# Patient Record
Sex: Male | Born: 1940
Health system: Southern US, Community
[De-identification: ages and names within clinical notes are randomized; demographics above are authoritative.]

## PROBLEM LIST (undated history)

## (undated) DIAGNOSIS — N529 Male erectile dysfunction, unspecified: Secondary | ICD-10-CM

## (undated) DIAGNOSIS — D689 Coagulation defect, unspecified: Secondary | ICD-10-CM

## (undated) DIAGNOSIS — E162 Hypoglycemia, unspecified: Secondary | ICD-10-CM

## (undated) DIAGNOSIS — H9190 Unspecified hearing loss, unspecified ear: Secondary | ICD-10-CM

## (undated) DIAGNOSIS — K635 Polyp of colon: Secondary | ICD-10-CM

## (undated) DIAGNOSIS — I4891 Unspecified atrial fibrillation: Secondary | ICD-10-CM

## (undated) DIAGNOSIS — I700 Atherosclerosis of aorta: Secondary | ICD-10-CM

## (undated) DIAGNOSIS — N400 Enlarged prostate without lower urinary tract symptoms: Secondary | ICD-10-CM

## (undated) DIAGNOSIS — T7840XA Allergy, unspecified, initial encounter: Secondary | ICD-10-CM

## (undated) DIAGNOSIS — E785 Hyperlipidemia, unspecified: Secondary | ICD-10-CM

## (undated) DIAGNOSIS — G4752 REM sleep behavior disorder: Secondary | ICD-10-CM

## (undated) DIAGNOSIS — I2510 Atherosclerotic heart disease of native coronary artery without angina pectoris: Secondary | ICD-10-CM

## (undated) DIAGNOSIS — C801 Malignant (primary) neoplasm, unspecified: Secondary | ICD-10-CM

## (undated) DIAGNOSIS — I951 Orthostatic hypotension: Secondary | ICD-10-CM

## (undated) HISTORY — PX: CORONARY ANGIOPLASTY WITH STENT PLACEMENT: SHX49

## (undated) HISTORY — DX: Atherosclerosis of aorta: I70.0

## (undated) HISTORY — DX: Male erectile dysfunction, unspecified: N52.9

## (undated) HISTORY — DX: Atherosclerotic heart disease of native coronary artery without angina pectoris: I25.10

## (undated) HISTORY — DX: REM sleep behavior disorder: G47.52

## (undated) HISTORY — DX: Orthostatic hypotension: I95.1

## (undated) HISTORY — PX: POLYPECTOMY: SHX149

## (undated) HISTORY — DX: Coagulation defect, unspecified: D68.9

## (undated) HISTORY — DX: Malignant (primary) neoplasm, unspecified: C80.1

## (undated) HISTORY — PX: UPPER GASTROINTESTINAL ENDOSCOPY: SHX188

## (undated) HISTORY — DX: Benign prostatic hyperplasia without lower urinary tract symptoms: N40.0

## (undated) HISTORY — DX: Allergy, unspecified, initial encounter: T78.40XA

## (undated) HISTORY — DX: Hyperlipidemia, unspecified: E78.5

## (undated) HISTORY — DX: Unspecified hearing loss, unspecified ear: H91.90

## (undated) HISTORY — PX: COLONOSCOPY: SHX174

## (undated) HISTORY — DX: Unspecified atrial fibrillation: I48.91

## (undated) HISTORY — DX: Polyp of colon: K63.5

## (undated) HISTORY — DX: Hypoglycemia, unspecified: E16.2

---

## 2006-01-04 ENCOUNTER — Encounter: Admission: RE | Admit: 2006-01-04 | Discharge: 2006-01-04 | Payer: Self-pay | Admitting: Family Medicine

## 2009-07-11 ENCOUNTER — Emergency Department (HOSPITAL_BASED_OUTPATIENT_CLINIC_OR_DEPARTMENT_OTHER): Admission: EM | Admit: 2009-07-11 | Discharge: 2009-07-11 | Payer: Self-pay | Admitting: Emergency Medicine

## 2009-07-11 ENCOUNTER — Ambulatory Visit: Payer: Self-pay | Admitting: Diagnostic Radiology

## 2012-07-04 ENCOUNTER — Emergency Department (HOSPITAL_BASED_OUTPATIENT_CLINIC_OR_DEPARTMENT_OTHER)
Admission: EM | Admit: 2012-07-04 | Discharge: 2012-07-04 | Disposition: A | Discharge status: Home / Self Care | Payer: BC Managed Care – PPO | Attending: Emergency Medicine | Admitting: Emergency Medicine

## 2012-07-04 ENCOUNTER — Emergency Department (HOSPITAL_BASED_OUTPATIENT_CLINIC_OR_DEPARTMENT_OTHER): Payer: BC Managed Care – PPO

## 2012-07-04 ENCOUNTER — Encounter (HOSPITAL_BASED_OUTPATIENT_CLINIC_OR_DEPARTMENT_OTHER): Payer: Self-pay | Admitting: *Deleted

## 2012-07-04 DIAGNOSIS — W19XXXA Unspecified fall, initial encounter: Secondary | ICD-10-CM

## 2012-07-04 DIAGNOSIS — S0180XA Unspecified open wound of other part of head, initial encounter: Secondary | ICD-10-CM | POA: Insufficient documentation

## 2012-07-04 DIAGNOSIS — I2510 Atherosclerotic heart disease of native coronary artery without angina pectoris: Secondary | ICD-10-CM | POA: Insufficient documentation

## 2012-07-04 DIAGNOSIS — Y9389 Activity, other specified: Secondary | ICD-10-CM | POA: Insufficient documentation

## 2012-07-04 DIAGNOSIS — Y929 Unspecified place or not applicable: Secondary | ICD-10-CM | POA: Insufficient documentation

## 2012-07-04 DIAGNOSIS — IMO0002 Reserved for concepts with insufficient information to code with codable children: Secondary | ICD-10-CM

## 2012-07-04 DIAGNOSIS — W010XXA Fall on same level from slipping, tripping and stumbling without subsequent striking against object, initial encounter: Secondary | ICD-10-CM | POA: Insufficient documentation

## 2012-07-04 DIAGNOSIS — M25519 Pain in unspecified shoulder: Secondary | ICD-10-CM | POA: Insufficient documentation

## 2012-07-04 HISTORY — DX: Atherosclerotic heart disease of native coronary artery without angina pectoris: I25.10

## 2012-07-04 MED ORDER — LIDOCAINE HCL 2 % IJ SOLN
INTRAMUSCULAR | Status: AC
  Filled 2012-07-04: qty 20

## 2012-07-04 NOTE — ED Notes (Signed)
MD at bedside. 

## 2012-07-04 NOTE — ED Notes (Signed)
Laceration over his right eye. Tripped over the dog and fell. Bleeding controlled. No loc. Hematoma noted.

## 2012-07-06 NOTE — ED Provider Notes (Signed)
History     CSN: [id number]  Arrival date & time 07/04/12  1759   First MD Initiated Contact with Patient 07/04/12 1953      Chief Complaint  Patient presents with  . Facial Laceration    (Consider location/radiation/quality/duration/timing/severity/associated sxs/prior treatment) HPI Comments: Pt comes in with cc of fall. Pt had a mechanical fall earlier today. He has mild bleeding, not on any anticoagulants. No nausea, vomiting, visual complains, seizures, altered mental status, loss of consciousness, new weakness, or numbness, no gait instability. Also has right shoulder pain.  The history is provided by the patient.    Past Medical History  Diagnosis Date  . Coronary artery disease     Past Surgical History  Procedure Date  . Coronary angioplasty with stent placement     No family history on file.  History  Substance Use Topics  . Smoking status: Never Smoker   . Smokeless tobacco: Not on file  . Alcohol Use:       Review of Systems  Constitutional: Negative for activity change and appetite change.  Respiratory: Negative for cough and shortness of breath.   Cardiovascular: Negative for chest pain.  Gastrointestinal: Negative for abdominal pain.  Genitourinary: Negative for dysuria.    Allergies  Penicillins  Home Medications  No current outpatient prescriptions on file.  BP 129/82  Pulse 80  Temp 97.7 F (36.5 C) (Oral)  Resp 18  SpO2 100%  Physical Exam  Nursing note and vitals reviewed. Constitutional: He is oriented to person, place, and time. He appears well-developed.  HENT:  Head: Normocephalic and atraumatic.  Eyes: Conjunctivae normal and EOM are normal. Pupils are equal, round, and reactive to light.  Neck: Normal range of motion. Neck supple.  Cardiovascular: Normal rate and regular rhythm.   Pulmonary/Chest: Effort normal and breath sounds normal.  Abdominal: Soft. Bowel sounds are normal. He exhibits no distension. There is  no tenderness. There is no rebound and no guarding.  Musculoskeletal: Normal range of motion. He exhibits no edema and no tenderness.       Right shoulder exam is normal  Neurological: He is alert and oriented to person, place, and time.  Skin: Skin is warm.       1.5 cm, macerated laceration with mild skin loss on the forehead, right above the right eye.    ED Course  Procedures (including critical care time)  Labs Reviewed - No data to display Dg Shoulder Right  07/04/2012  *RADIOLOGY REPORT*  Clinical Data: Facial laceration.  Trauma.  Fall.  RIGHT SHOULDER - 2+ VIEW  Comparison: None.  Findings: Internal and external rotation views of the right shoulder appear within normal limits.  AC joint appears normal. The shoulder is located.  Visualized right chest normal.  IMPRESSION: Negative.   Original Report Authenticated By: Andreas Newport, M.D.    Ct Head Wo Contrast  07/04/2012  *RADIOLOGY REPORT*  Clinical Data: Head trauma.  Right frontal laceration.  Right anterior shoulder pain.  CT HEAD WITHOUT CONTRAST  Technique:  Contiguous axial images were obtained from the base of the skull through the vertex without contrast.  Comparison: None.  Findings: Mild soft tissue swelling is present in the right periorbital region with a small laceration of the lateral orbital rim.  The visualized globe appears intact.  Paranasal sinuses normal.  There is no mass effect, midline shift, hydrocephalus, or hemorrhage.  No territorial ischemia or acute infarction. Calvarium is intact.  In the left parietal lobe adjacent  to the central sulcus, there is a 12 mm area (image number 18 series 2) of increased attenuation. This has poorly defined margins. Statistically this likely represents a benign cavernoma however follow-up imaging is warranted.  IMPRESSION: 1.  Right periorbital laceration and soft tissue swelling. 2.  No acute intracranial abnormality. 2.  12 mm left parietal lobe intraaxial high attenuation  lesion adjacent to the central sulcus warrants further evaluation with non emergent MRI of the brain with and without infusion.  Statistically this likely represents a benign cavernoma but the appearance is nonspecific on noncontrast head CT.   Original Report Authenticated By: Andreas Newport, M.D.      1. Fall   2. Laceration       MDM  DDx includes: - Mechanical falls - ICH - Fractures - Contusions - Soft tissue injury Pt has a laceration - which was repaired. CT head ordered, and it was neg.  LACERATION REPAIR Performed by: Derwood Kaplan Authorized by: Derwood Kaplan Consent: Verbal consent obtained. Risks and benefits: risks, benefits and alternatives were discussed Consent given by: patient Patient identity confirmed: provided demographic data Prepped and Draped in normal sterile fashion Wound explored  Laceration Location: right forehead  Laceration Length: 2 cm  No Foreign Bodies seen or palpated  Anesthesia: local infiltration  Local anesthetic: lidocaine 2% with epinephrine  Anesthetic total: 3 ml  Irrigation method: syringe Amount of cleaning: standard  Skin closure: sutures - vicryl, 5-0  Number of sutures: 3  Technique: simple interrupted  Patient tolerance: Patient tolerated the procedure well with no immediate complications.  Derwood Kaplan, MD 07/06/12 [phone]

## 2013-09-19 DIAGNOSIS — I2510 Atherosclerotic heart disease of native coronary artery without angina pectoris: Secondary | ICD-10-CM | POA: Diagnosis not present

## 2013-09-19 DIAGNOSIS — I10 Essential (primary) hypertension: Secondary | ICD-10-CM | POA: Diagnosis not present

## 2013-09-19 DIAGNOSIS — E7800 Pure hypercholesterolemia, unspecified: Secondary | ICD-10-CM | POA: Diagnosis not present

## 2013-10-25 DIAGNOSIS — C44319 Basal cell carcinoma of skin of other parts of face: Secondary | ICD-10-CM | POA: Diagnosis not present

## 2013-11-27 DIAGNOSIS — R351 Nocturia: Secondary | ICD-10-CM | POA: Diagnosis not present

## 2013-11-27 DIAGNOSIS — R413 Other amnesia: Secondary | ICD-10-CM | POA: Diagnosis not present

## 2013-11-27 DIAGNOSIS — I2510 Atherosclerotic heart disease of native coronary artery without angina pectoris: Secondary | ICD-10-CM | POA: Diagnosis not present

## 2013-11-27 DIAGNOSIS — J069 Acute upper respiratory infection, unspecified: Secondary | ICD-10-CM | POA: Diagnosis not present

## 2013-11-27 DIAGNOSIS — E7800 Pure hypercholesterolemia, unspecified: Secondary | ICD-10-CM | POA: Diagnosis not present

## 2013-11-27 DIAGNOSIS — E559 Vitamin D deficiency, unspecified: Secondary | ICD-10-CM | POA: Diagnosis not present

## 2013-11-27 DIAGNOSIS — Z125 Encounter for screening for malignant neoplasm of prostate: Secondary | ICD-10-CM | POA: Diagnosis not present

## 2013-11-27 DIAGNOSIS — R5381 Other malaise: Secondary | ICD-10-CM | POA: Diagnosis not present

## 2013-11-27 DIAGNOSIS — Z0000 Encounter for general adult medical examination without abnormal findings: Secondary | ICD-10-CM | POA: Diagnosis not present

## 2013-12-07 DIAGNOSIS — R972 Elevated prostate specific antigen [PSA]: Secondary | ICD-10-CM | POA: Diagnosis not present

## 2014-01-22 DIAGNOSIS — N529 Male erectile dysfunction, unspecified: Secondary | ICD-10-CM | POA: Diagnosis not present

## 2014-01-22 DIAGNOSIS — R972 Elevated prostate specific antigen [PSA]: Secondary | ICD-10-CM | POA: Diagnosis not present

## 2014-02-11 DIAGNOSIS — N529 Male erectile dysfunction, unspecified: Secondary | ICD-10-CM | POA: Diagnosis not present

## 2014-02-11 DIAGNOSIS — R972 Elevated prostate specific antigen [PSA]: Secondary | ICD-10-CM | POA: Diagnosis not present

## 2014-03-21 DIAGNOSIS — R9431 Abnormal electrocardiogram [ECG] [EKG]: Secondary | ICD-10-CM | POA: Diagnosis not present

## 2014-03-21 DIAGNOSIS — I2510 Atherosclerotic heart disease of native coronary artery without angina pectoris: Secondary | ICD-10-CM | POA: Diagnosis not present

## 2014-03-21 DIAGNOSIS — E7800 Pure hypercholesterolemia, unspecified: Secondary | ICD-10-CM | POA: Diagnosis not present

## 2014-05-01 DIAGNOSIS — Z23 Encounter for immunization: Secondary | ICD-10-CM | POA: Diagnosis not present

## 2014-10-15 DIAGNOSIS — L409 Psoriasis, unspecified: Secondary | ICD-10-CM | POA: Diagnosis not present

## 2014-10-15 DIAGNOSIS — L570 Actinic keratosis: Secondary | ICD-10-CM | POA: Diagnosis not present

## 2014-10-29 ENCOUNTER — Ambulatory Visit (HOSPITAL_COMMUNITY)
Admission: RE | Admit: 2014-10-29 | Discharge: 2014-10-29 | Disposition: A | Discharge status: Home / Self Care | Payer: Medicare Other | Source: Ambulatory Visit | Attending: Internal Medicine | Admitting: Internal Medicine

## 2014-10-29 ENCOUNTER — Encounter (HOSPITAL_COMMUNITY): Payer: Self-pay | Admitting: Internal Medicine

## 2014-10-29 ENCOUNTER — Encounter (HOSPITAL_COMMUNITY): Payer: Self-pay

## 2014-10-29 VITALS — BP 86/52 | HR 86 | Ht 72.0 in | Wt 158.1 lb

## 2014-10-29 DIAGNOSIS — I509 Heart failure, unspecified: Secondary | ICD-10-CM | POA: Diagnosis not present

## 2014-10-29 DIAGNOSIS — I2510 Atherosclerotic heart disease of native coronary artery without angina pectoris: Secondary | ICD-10-CM | POA: Diagnosis not present

## 2014-10-29 DIAGNOSIS — Z79899 Other long term (current) drug therapy: Secondary | ICD-10-CM | POA: Insufficient documentation

## 2014-10-29 DIAGNOSIS — E162 Hypoglycemia, unspecified: Secondary | ICD-10-CM | POA: Diagnosis not present

## 2014-10-29 DIAGNOSIS — Z7982 Long term (current) use of aspirin: Secondary | ICD-10-CM | POA: Diagnosis not present

## 2014-10-29 DIAGNOSIS — I25811 Atherosclerosis of native coronary artery of transplanted heart without angina pectoris: Secondary | ICD-10-CM | POA: Diagnosis not present

## 2014-10-29 DIAGNOSIS — E785 Hyperlipidemia, unspecified: Secondary | ICD-10-CM | POA: Diagnosis not present

## 2014-10-29 MED ORDER — ATORVASTATIN CALCIUM 10 MG PO TABS: 10.0000 mg | ORAL_TABLET | Freq: Every day | ORAL | Status: DC

## 2014-10-29 MED ORDER — NITROGLYCERIN 0.4 MG SL SUBL: 0.4000 mg | SUBLINGUAL_TABLET | SUBLINGUAL | Status: DC | PRN

## 2014-10-29 NOTE — Progress Notes (Signed)
' Patient ID: Jerry Barr, male   DOB: Mar 02, 1941, 74 y.o.   MRN: [id number]  PCP: Casimiro Needle   HPI:   Jerry Barr is a very pleasant 74 y/o male with CAD, HL and hypoglycemia who is referred by Dr. Carleene Cooper at [facility] in [facility] to establish care as he is moving to the area.  He has h/o CAD which manifested as indigestion and underwent PCI of LAD in 2011 with Promus 2.75x87mm DES. Otherwise has mild to moderate CAD. Myoview 2012 with EF 58% Had ST depression but Myoview images were normal.   He has had a h/o of hypoglycemia and has been very diligent about managing his diet and eating healthy. Eats foods low on glycemic index. Does fast walking about 200 mins per week. Denies CP or indigestion. Was scheduled for stress test in March.    Review of Systems: [y] = yes, [ ]  = no   General: Weight gain [ ] ; Weight loss [ ] ; Anorexia [ ] ; Fatigue [ ] ; Fever [ ] ; Chills [ ] ; Weakness [ ]   Cardiac: Chest pain/pressure [ ] ; Resting SOB [ ] ; Exertional SOB [ ] ; Orthopnea [ ] ; Pedal Edema [ ] ; Palpitations [ ] ; Syncope [ ] ; Presyncope [ ] ; Paroxysmal nocturnal dyspnea[ ]   Pulmonary: Cough [ ] ; Wheezing[ ] ; Hemoptysis[ ] ; Sputum [ ] ; Snoring [ ]   GI: Vomiting[ ] ; Dysphagia[ ] ; Melena[ ] ; Hematochezia [ ] ; Heartburn[ ] ; Abdominal pain [ ] ; Constipation [ ] ; Diarrhea [ ] ; BRBPR [ ]   GU: Hematuria[ ] ; Dysuria [ ] ; Nocturia[ ]   Vascular: Pain in legs with walking [ ] ; Pain in feet with lying flat [ ] ; Non-healing sores [ ] ; Stroke [ ] ; TIA [ ] ; Slurred speech [ ] ;  Neuro: Headaches[ ] ; Vertigo[ ] ; Seizures[ ] ; Paresthesias[ ] ;Blurred vision [ ] ; Diplopia [ ] ; Vision changes [ ]   Ortho/Skin: Arthritis Blue.Reese ]; Joint pain [ ] ; Muscle pain [ ] ; Joint swelling [ ] ; Back Pain [ ] ; Rash [ ]   Psych: Depression[ ] ; Anxiety[ ]   Heme: Bleeding problems [ ] ; Clotting disorders [ ] ; Anemia [ ]   Endocrine: Diabetes [ ] ; Thyroid dysfunction[ ]    Past Medical History  Diagnosis Date  . Coronary artery disease       Promus drug-eluting stent to a 99% mid LAD, 40-50% narrowing in the distal LAD with mild to moderate disease in the circ and RCA  . Dyslipidemia   . ED (erectile dysfunction)   . BPH (benign prostatic hyperplasia)   . Hyperplastic colon polyp     Current Outpatient Prescriptions  Medication Sig Dispense Refill  . aspirin 81 MG tablet Take 81 mg by mouth daily.    Marland Kitchen atorvastatin (LIPITOR) 10 MG tablet Take 10 mg by mouth daily.    . Cholecalciferol (VITAMIN D-3) 1000 UNITS CAPS Take 1,000 Units by mouth daily.    . fluticasone (FLONASE) 50 MCG/ACT nasal spray Place 2 sprays into both nostrils as needed for allergies or rhinitis.    Marland Kitchen loratadine (CLARITIN) 10 MG tablet Take 10 mg by mouth daily.    . Multiple Vitamin (MULTIVITAMIN) tablet Take 1 tablet by mouth daily.    . nitroGLYCERIN (NITROSTAT) 0.4 MG SL tablet Place 0.4 mg under the tongue every 5 (five) minutes as needed for chest pain.    Marland Kitchen Psyllium (METAMUCIL PO) Take by mouth.     No current facility-administered medications for this encounter.    Allergies  Allergen Reactions  . Penicillins  States at one time he was allergic but has had it since and had no problem.       History   Social History  . Marital Status: Married    Spouse Name: N/A  . Number of Children: N/A  . Years of Education: N/A   Occupational History  . Not on file.   Social History Main Topics  . Smoking status: Never Smoker   . Smokeless tobacco: Not on file  . Alcohol Use: Not on file  . Drug Use: No  . Sexual Activity: Not on file   Other Topics Concern  . Not on file   Social History Narrative      Family History  Problem Relation Age of Onset  .       Filed Vitals:   10/29/14 1145  BP: 86/52  Pulse: 86  Height: 6' (1.829 m)  Weight: 158 lb 1.9 oz (71.723 kg)  SpO2: 98%    PHYSICAL EXAM: General:  Well appearing. No respiratory difficulty HEENT: normal Neck: supple. no JVD. Carotids 2+ bilat; no bruits. No  lymphadenopathy or thryomegaly appreciated. Cor: PMI nondisplaced. Regular rate & rhythm. No rubs, gallops or murmurs. Lungs: clear Abdomen: soft, nontender, nondistended. No hepatosplenomegaly. No bruits or masses. Good bowel sounds. Extremities: no cyanosis, clubbing, rash, edema Neuro: alert & oriented x 3, cranial nerves grossly intact. moves all 4 extremities w/o difficulty. Affect pleasant.  ECG: NSR 73 No ST-T wave abnormalities.   ASSESSMENT & PLAN: 1. CAD    --s/p Promus DES to 99% mLAD stenosis 2011 (in [place]). Moderate nonobstructive CAD otherwise    --Myoview 2012 EF 58% no ischemia. False + ECG.  2. Hyperlipidemia 3. History of hypoglycemia with associated syncope 4. ED  Doing very well. Due for repeat stress testing. Given h/o false positive ECG on treadmill testing will proceed with treadmill myoview.  Will check routine labs including lipids, CMET, CBC, TSH and PSA for his PCP. F/u 1 year with Dr. Martinique at [facility].  Benay Spice 12:39 PM

## 2014-10-29 NOTE — Addendum Note (Signed)
Encounter addended by: Kerry Dory, CMA on: 10/29/2014 12:47 PM<BR>     Documentation filed: Patient Instructions Section, Dx Association, Orders

## 2014-10-29 NOTE — Patient Instructions (Signed)
Labs needed next available (FLP, CMET, TSH, CBC, PSA----FASTING)  Your physician has requested that you have en exercise stress myoview. For further information please visit [email or web address]. Please follow instruction sheet, as given.  Your physician recommends that you schedule a follow-up appointment in: 1 year with Dr.Jordan at [facility]

## 2014-11-01 ENCOUNTER — Encounter: Payer: Self-pay | Admitting: Cardiology

## 2014-11-06 ENCOUNTER — Telehealth (HOSPITAL_COMMUNITY): Payer: Self-pay

## 2014-11-06 NOTE — Telephone Encounter (Signed)
Encounter complete. 

## 2014-11-07 ENCOUNTER — Ambulatory Visit (HOSPITAL_COMMUNITY)
Admission: RE | Admit: 2014-11-07 | Discharge: 2014-11-07 | Disposition: A | Discharge status: Home / Self Care | Payer: Medicare Other | Source: Ambulatory Visit | Attending: Cardiology | Admitting: Cardiology

## 2014-11-07 ENCOUNTER — Encounter (HOSPITAL_COMMUNITY): Payer: Medicare Other

## 2014-11-07 DIAGNOSIS — I2510 Atherosclerotic heart disease of native coronary artery without angina pectoris: Secondary | ICD-10-CM | POA: Diagnosis not present

## 2014-11-07 DIAGNOSIS — E785 Hyperlipidemia, unspecified: Secondary | ICD-10-CM | POA: Diagnosis not present

## 2014-11-07 DIAGNOSIS — I25811 Atherosclerosis of native coronary artery of transplanted heart without angina pectoris: Secondary | ICD-10-CM | POA: Diagnosis not present

## 2014-11-07 MED ORDER — TECHNETIUM TC 99M SESTAMIBI GENERIC - CARDIOLITE
32.0000 | Freq: Once | INTRAVENOUS | Status: AC | PRN
  Administered 2014-11-07: 32 via INTRAVENOUS

## 2014-11-07 MED ORDER — TECHNETIUM TC 99M SESTAMIBI GENERIC - CARDIOLITE
10.9000 | Freq: Once | INTRAVENOUS | Status: AC | PRN
  Administered 2014-11-07: 10.9 via INTRAVENOUS

## 2014-11-07 NOTE — Procedures (Addendum)
[facility] [address] [place] [address] [phone]  Cardiology Nuclear Med Study  Jerry Barr is a 74 y.o. male     MRN : [id number]     DOB: 03/09/1941  Procedure Date: 11/07/2014  Nuclear Med Background Indication for Stress Test:  Evaluation for Ischemia and Follow up CAD History:  CAD;STENT/PTCA-2011;Last NUC MPI in 07/2010-normal;EF=58%;ETT-1997-normal; Cardiac Risk Factors: Lipids  Symptoms:  Pt denies symptoms at this time.   Nuclear Pre-Procedure Caffeine/Decaff Intake:  7:00pm NPO After: 3:00am   IV Site: R Forearm  IV 0.9% NS with Angio Cath:  22g  Chest Size (in):  36" IV Started by: Larene Beach, RN  Height: 6\' 1"  (1.854 m)  Cup Size: n/a  BMI:  Body mass index is 20.85 kg/(m^2). Weight:  158 lb (71.668 kg)   Tech Comments:  n/a    Nuclear Med Study 1 or 2 day study: 1 day  Stress Test Type:  Stress  Order Authorizing Provider:  Glori Bickers, MD   Resting Radionuclide: Technetium 14m Sestamibi  Resting Radionuclide Dose: 10.9 mCi   Stress Radionuclide:  Technetium 5m Sestamibi  Stress Radionuclide Dose: 32.0 mCi           Stress Protocol Rest HR: 91 Stress HR: 136  Rest BP: 86/67 Stress BP: 164/82  Exercise Time (min): 11:30 METS: 13.5   Predicted Max HR: 147 bpm % Max HR: 92.52 bpm Rate Pressure Product: 22304  Dose of Adenosine (mg):  n/a Dose of Lexiscan: n/a mg  Dose of Atropine (mg): n/a Dose of Dobutamine: n/a mcg/kg/min (at max HR)  Stress Test Technologist: Leane Para, CCT Nuclear Technologist:Elizabeth Young,CNMT   Rest Procedure:  Myocardial perfusion imaging was performed at rest 45 minutes following the intravenous administration of Technetium 81m Sestamibi. Stress Procedure:  The patient performed treadmill exercise using a Bruce  Protocol for 11:30 minutes. The patient stopped due to Shoulder and arm pain from holding on and denied any chest pain.  There were  significant ST-T wave changes.  Technetium 72m Sestamibi was injected IV at peak exercise and myocardial perfusion imaging was performed after a brief delay.  Transient Ischemic Dilatation (Normal <1.22):  1.05 QGS EDV:  109 ml QGS ESV:  50 ml LV Ejection Fraction: 54%      Rest ECG: NSR - Normal EKG  Stress ECG: Insignificant upsloping ST segment depression.  QPS Raw Data Images:  Normal; no motion artifact; normal heart/lung ratio. Stress Images:  Normal homogeneous uptake in all areas of the myocardium. Rest Images:  Normal homogeneous uptake in all areas of the myocardium. Subtraction (SDS):  No evidence of ischemia. LV Wall Motion:  NL LV Function; NL Wall Motion  Impression Exercise Capacity:  Excellent exercise capacity. BP Response:  Normal blood pressure response. Clinical Symptoms:  No significant symptoms noted. ECG Impression:  Insignificant upsloping ST segment depression. Comparison with Prior Nuclear Study: No significant change from previous study   Overall Impression:  Normal stress nuclear study.   [name], MD  11/07/2014 7:19 PM

## 2015-04-02 DIAGNOSIS — D239 Other benign neoplasm of skin, unspecified: Secondary | ICD-10-CM | POA: Diagnosis not present

## 2015-04-02 DIAGNOSIS — L570 Actinic keratosis: Secondary | ICD-10-CM | POA: Diagnosis not present

## 2015-04-02 DIAGNOSIS — L820 Inflamed seborrheic keratosis: Secondary | ICD-10-CM | POA: Diagnosis not present

## 2015-04-04 DIAGNOSIS — Z1389 Encounter for screening for other disorder: Secondary | ICD-10-CM | POA: Diagnosis not present

## 2015-04-04 DIAGNOSIS — I2510 Atherosclerotic heart disease of native coronary artery without angina pectoris: Secondary | ICD-10-CM | POA: Diagnosis not present

## 2015-04-04 DIAGNOSIS — Z23 Encounter for immunization: Secondary | ICD-10-CM | POA: Diagnosis not present

## 2015-04-04 DIAGNOSIS — H9190 Unspecified hearing loss, unspecified ear: Secondary | ICD-10-CM | POA: Diagnosis not present

## 2015-04-04 DIAGNOSIS — E785 Hyperlipidemia, unspecified: Secondary | ICD-10-CM | POA: Diagnosis not present

## 2015-04-04 DIAGNOSIS — Z0000 Encounter for general adult medical examination without abnormal findings: Secondary | ICD-10-CM | POA: Diagnosis not present

## 2015-04-04 DIAGNOSIS — J309 Allergic rhinitis, unspecified: Secondary | ICD-10-CM | POA: Diagnosis not present

## 2015-04-04 DIAGNOSIS — Z125 Encounter for screening for malignant neoplasm of prostate: Secondary | ICD-10-CM | POA: Diagnosis not present

## 2015-04-29 DIAGNOSIS — L570 Actinic keratosis: Secondary | ICD-10-CM | POA: Diagnosis not present

## 2015-05-27 DIAGNOSIS — D0422 Carcinoma in situ of skin of left ear and external auricular canal: Secondary | ICD-10-CM | POA: Diagnosis not present

## 2015-05-27 DIAGNOSIS — D0420 Carcinoma in situ of skin of unspecified ear and external auricular canal: Secondary | ICD-10-CM | POA: Diagnosis not present

## 2015-06-25 DIAGNOSIS — J209 Acute bronchitis, unspecified: Secondary | ICD-10-CM | POA: Diagnosis not present

## 2015-06-25 DIAGNOSIS — J329 Chronic sinusitis, unspecified: Secondary | ICD-10-CM | POA: Diagnosis not present

## 2015-09-30 ENCOUNTER — Other Ambulatory Visit: Payer: Self-pay | Admitting: Family Medicine

## 2015-09-30 ENCOUNTER — Ambulatory Visit
Admission: RE | Admit: 2015-09-30 | Discharge: 2015-09-30 | Disposition: A | Discharge status: Home / Self Care | Payer: Medicare Other | Source: Ambulatory Visit | Attending: Family Medicine | Admitting: Family Medicine

## 2015-09-30 DIAGNOSIS — R05 Cough: Secondary | ICD-10-CM | POA: Diagnosis not present

## 2015-09-30 DIAGNOSIS — J301 Allergic rhinitis due to pollen: Secondary | ICD-10-CM | POA: Diagnosis not present

## 2015-09-30 DIAGNOSIS — J069 Acute upper respiratory infection, unspecified: Secondary | ICD-10-CM

## 2015-12-09 ENCOUNTER — Other Ambulatory Visit (HOSPITAL_COMMUNITY): Payer: Self-pay | Admitting: Internal Medicine

## 2015-12-12 ENCOUNTER — Encounter: Payer: Self-pay | Admitting: Cardiology

## 2015-12-12 ENCOUNTER — Ambulatory Visit (INDEPENDENT_AMBULATORY_CARE_PROVIDER_SITE_OTHER): Payer: Medicare Other | Admitting: Cardiology

## 2015-12-12 VITALS — BP 80/52 | HR 84 | Ht 72.0 in | Wt 161.0 lb

## 2015-12-12 DIAGNOSIS — I2510 Atherosclerotic heart disease of native coronary artery without angina pectoris: Secondary | ICD-10-CM | POA: Diagnosis not present

## 2015-12-12 DIAGNOSIS — E785 Hyperlipidemia, unspecified: Secondary | ICD-10-CM | POA: Diagnosis not present

## 2015-12-12 NOTE — Progress Notes (Signed)
' Patient ID: Jerry Barr, male   DOB: Mar 09, 1941, 75 y.o.   MRN: [id number]  PCP: Jerry Contras MD Cardiologist: Jerry Martinique MD  HPI:   Jerry Barr is a very pleasant 75 y/o male seen for follow up  CAD, HL and hypoglycemia. He was originally referred by Jerry Barr at [facility] in [facility] to establish care once he moved to this area. He previously worked for [employer] for 30 years. 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   He has h/o CAD which manifested as indigestion and underwent PCI of LAD in 2011 with Promus 2.75x38mm DES. Otherwise has mild to moderate CAD. Myoview April  2016 was normal.   He has had a h/o of hypoglycemia and has been very diligent about managing his diet and eating healthy. He also has a history of chronic hypotension but really denies dizziness or syncope.  Does fast walking regularly. Denies CP or indigestion.    ROS: as noted in HPI. All other systems are reviewed and are negative.   Past Medical History  Diagnosis Date  . Coronary artery disease     Promus drug-eluting stent to a 99% mid LAD, 40-50% narrowing in the distal LAD with mild to moderate disease in the circ and RCA  . Dyslipidemia   . ED (erectile dysfunction)   . BPH (benign prostatic hyperplasia)   . Hyperplastic colon polyp   . Hypoglycemia     Current Outpatient Prescriptions  Medication Sig Dispense Refill  . aspirin 81 MG tablet Take 81 mg by mouth daily.    Marland Kitchen atorvastatin (LIPITOR) 10 MG tablet TAKE 1 TABLET (10 MG TOTAL) BY MOUTH DAILY. 90 tablet 3  . Cholecalciferol (VITAMIN D-3) 1000 UNITS CAPS Take 1,000 Units by mouth daily.    . fluticasone (FLONASE) 50 MCG/ACT nasal spray Place 2 sprays into both nostrils as needed for allergies or rhinitis.    Marland Kitchen loratadine (CLARITIN) 10 MG tablet Take 10 mg by mouth daily.    . Multiple Vitamin (MULTIVITAMIN) tablet Take 1 tablet by mouth daily.    . nitroGLYCERIN (NITROSTAT)  0.4 MG SL tablet Place 1 tablet (0.4 mg total) under the tongue every 5 (five) minutes as needed for chest pain. 30 tablet 3  . Psyllium (METAMUCIL PO) Take by mouth.     No current facility-administered medications for this visit.    Allergies  Allergen Reactions  . Penicillins     States at one time he was allergic but has had it since and had no problem.       Social History   Social History  . Marital Status: Married    Spouse Name: N/A  . Number of Children: N/A  . Years of Education: N/A   Occupational History  . Not on file.   Social History Main Topics  . Smoking status: Never Smoker   . Smokeless tobacco: Not on file  . Alcohol Use: Not on file  . Drug Use: No  . Sexual Activity: Not on file   Other Topics Concern  . Not on file   Social History Narrative      History reviewed. No pertinent family history.  Filed Vitals:   12/12/15 0903  BP: 80/52  Pulse: 84  Height: 6' (1.829 m)  Weight: 73.029 kg (161 lb)    PHYSICAL EXAM: General:  Well appearing. No respiratory difficulty HEENT: normal Neck: supple. no JVD. Carotids 2+  bilat; no bruits. No lymphadenopathy or thryomegaly appreciated. Cor: PMI nondisplaced. Regular rate & rhythm. No rubs, gallops or murmurs. Lungs: clear Abdomen: soft, nontender, nondistended. No hepatosplenomegaly. No bruits or masses. Good bowel sounds. Extremities: no cyanosis, clubbing, rash, edema Neuro: alert & oriented x 3, cranial nerves grossly intact. moves all 4 extremities w/o difficulty. Affect pleasant.  ECG: NSR 84. Normal. I have personally reviewed and interpreted this study.   ASSESSMENT & PLAN: 1. CAD    --s/p Promus DES to 99% mLAD stenosis 2011 (in [place]). Moderate nonobstructive CAD otherwise    --Myoview April 2016 was normal.    - he is asymptomatic. Continue ASA and statin. Not a candidate for beta blocker due to chronic hypotension.  2. Hyperlipidemia 3. History of hypoglycemia  4. Chronic  hypotension.  Follow up lab work done by PCP. I will follow up in one year.   Jerry Barr 12:18 PM

## 2015-12-12 NOTE — Patient Instructions (Signed)
Continue your current therapy  I will see you in one year   

## 2015-12-12 NOTE — Addendum Note (Signed)
Addended by: Kathyrn Lass on: 12/12/2015 01:21 PM   Modules accepted: Orders

## 2016-04-12 DIAGNOSIS — D239 Other benign neoplasm of skin, unspecified: Secondary | ICD-10-CM | POA: Diagnosis not present

## 2016-04-12 DIAGNOSIS — L570 Actinic keratosis: Secondary | ICD-10-CM | POA: Diagnosis not present

## 2016-04-12 DIAGNOSIS — L821 Other seborrheic keratosis: Secondary | ICD-10-CM | POA: Diagnosis not present

## 2016-04-20 ENCOUNTER — Encounter (HOSPITAL_COMMUNITY): Payer: Self-pay | Admitting: Emergency Medicine

## 2016-04-20 ENCOUNTER — Emergency Department (HOSPITAL_COMMUNITY): Payer: Medicare Other

## 2016-04-20 ENCOUNTER — Encounter (HOSPITAL_COMMUNITY)
Admission: EM | Disposition: A | Discharge status: Home / Self Care | Payer: Self-pay | Source: Home / Self Care | Attending: Cardiology

## 2016-04-20 ENCOUNTER — Inpatient Hospital Stay (HOSPITAL_COMMUNITY)
Admission: EM | Admit: 2016-04-20 | Discharge: 2016-04-23 | DRG: 309 | Disposition: A | Discharge status: Home / Self Care | Payer: Medicare Other | Attending: Cardiology | Admitting: Cardiology

## 2016-04-20 DIAGNOSIS — Z880 Allergy status to penicillin: Secondary | ICD-10-CM

## 2016-04-20 DIAGNOSIS — I2510 Atherosclerotic heart disease of native coronary artery without angina pectoris: Secondary | ICD-10-CM | POA: Diagnosis present

## 2016-04-20 DIAGNOSIS — Z8249 Family history of ischemic heart disease and other diseases of the circulatory system: Secondary | ICD-10-CM

## 2016-04-20 DIAGNOSIS — R079 Chest pain, unspecified: Secondary | ICD-10-CM | POA: Diagnosis not present

## 2016-04-20 DIAGNOSIS — S8992XA Unspecified injury of left lower leg, initial encounter: Secondary | ICD-10-CM | POA: Diagnosis not present

## 2016-04-20 DIAGNOSIS — Z87891 Personal history of nicotine dependence: Secondary | ICD-10-CM | POA: Diagnosis not present

## 2016-04-20 DIAGNOSIS — R55 Syncope and collapse: Secondary | ICD-10-CM | POA: Diagnosis present

## 2016-04-20 DIAGNOSIS — I340 Nonrheumatic mitral (valve) insufficiency: Secondary | ICD-10-CM | POA: Diagnosis not present

## 2016-04-20 DIAGNOSIS — N400 Enlarged prostate without lower urinary tract symptoms: Secondary | ICD-10-CM | POA: Diagnosis present

## 2016-04-20 DIAGNOSIS — I4891 Unspecified atrial fibrillation: Principal | ICD-10-CM | POA: Diagnosis present

## 2016-04-20 DIAGNOSIS — Z79899 Other long term (current) drug therapy: Secondary | ICD-10-CM | POA: Diagnosis not present

## 2016-04-20 DIAGNOSIS — E785 Hyperlipidemia, unspecified: Secondary | ICD-10-CM | POA: Diagnosis not present

## 2016-04-20 DIAGNOSIS — I248 Other forms of acute ischemic heart disease: Secondary | ICD-10-CM | POA: Diagnosis present

## 2016-04-20 DIAGNOSIS — R002 Palpitations: Secondary | ICD-10-CM | POA: Diagnosis not present

## 2016-04-20 DIAGNOSIS — S0990XA Unspecified injury of head, initial encounter: Secondary | ICD-10-CM | POA: Diagnosis not present

## 2016-04-20 DIAGNOSIS — R918 Other nonspecific abnormal finding of lung field: Secondary | ICD-10-CM | POA: Diagnosis not present

## 2016-04-20 DIAGNOSIS — I9589 Other hypotension: Secondary | ICD-10-CM | POA: Diagnosis not present

## 2016-04-20 DIAGNOSIS — M25562 Pain in left knee: Secondary | ICD-10-CM | POA: Diagnosis not present

## 2016-04-20 DIAGNOSIS — Z23 Encounter for immunization: Secondary | ICD-10-CM

## 2016-04-20 DIAGNOSIS — Z7982 Long term (current) use of aspirin: Secondary | ICD-10-CM

## 2016-04-20 DIAGNOSIS — Z955 Presence of coronary angioplasty implant and graft: Secondary | ICD-10-CM | POA: Diagnosis not present

## 2016-04-20 DIAGNOSIS — I480 Paroxysmal atrial fibrillation: Secondary | ICD-10-CM

## 2016-04-20 DIAGNOSIS — I209 Angina pectoris, unspecified: Secondary | ICD-10-CM

## 2016-04-20 LAB — CBC WITH DIFFERENTIAL/PLATELET
BASOS PCT: 0 %
Basophils Absolute: 0 10*3/uL (ref 0.0–0.1)
EOS ABS: 0.2 10*3/uL (ref 0.0–0.7)
Eosinophils Relative: 2 %
HEMATOCRIT: 43.4 % (ref 39.0–52.0)
Hemoglobin: 14.3 g/dL (ref 13.0–17.0)
LYMPHS ABS: 1.2 10*3/uL (ref 0.7–4.0)
Lymphocytes Relative: 13 %
MCH: 30.6 pg (ref 26.0–34.0)
MCHC: 32.9 g/dL (ref 30.0–36.0)
MCV: 92.9 fL (ref 78.0–100.0)
MONO ABS: 0.7 10*3/uL (ref 0.1–1.0)
MONOS PCT: 7 %
Neutro Abs: 7.5 10*3/uL (ref 1.7–7.7)
Neutrophils Relative %: 78 %
PLATELETS: 301 10*3/uL (ref 150–400)
RBC: 4.67 MIL/uL (ref 4.22–5.81)
RDW: 13.6 % (ref 11.5–15.5)
WBC: 9.6 10*3/uL (ref 4.0–10.5)

## 2016-04-20 LAB — BASIC METABOLIC PANEL
Anion gap: 6 (ref 5–15)
BUN: 18 mg/dL (ref 6–20)
CALCIUM: 8.8 mg/dL — AB (ref 8.9–10.3)
CHLORIDE: 107 mmol/L (ref 101–111)
CO2: 25 mmol/L (ref 22–32)
CREATININE: 1.06 mg/dL (ref 0.61–1.24)
GFR calc Af Amer: >60 mL/min (ref >60)
GFR calc non Af Amer: >60 mL/min (ref >60)
GLUCOSE: 102 mg/dL — AB (ref 65–99)
Potassium: 4.3 mmol/L (ref 3.5–5.1)
Sodium: 138 mmol/L (ref 135–145)

## 2016-04-20 LAB — MAGNESIUM: MAGNESIUM: 2.3 mg/dL (ref 1.7–2.4)

## 2016-04-20 LAB — TSH: TSH: 0.571 u[IU]/mL (ref 0.350–4.500)

## 2016-04-20 LAB — TROPONIN I: TROPONIN I: 0.03 ng/mL — AB (ref <0.03)

## 2016-04-20 LAB — I-STAT TROPONIN, ED: TROPONIN I, POC: 0 ng/mL (ref 0.00–0.08)

## 2016-04-20 LAB — D-DIMER, QUANTITATIVE: D-Dimer, Quant: 0.52 ug/mL-FEU — ABNORMAL HIGH (ref 0.00–0.50)

## 2016-04-20 SURGERY — LEFT HEART CATH AND CORONARY ANGIOGRAPHY

## 2016-04-20 MED ORDER — LORATADINE 10 MG PO TABS
10.0000 mg | ORAL_TABLET | Freq: Every day | ORAL | Status: DC
  Administered 2016-04-20 – 2016-04-23 (×4): 10 mg via ORAL
  Filled 2016-04-20 (×4): qty 1

## 2016-04-20 MED ORDER — SODIUM CHLORIDE 0.9 % IV SOLN
Freq: Once | INTRAVENOUS | Status: AC
  Administered 2016-04-20: 10:00:00 via INTRAVENOUS

## 2016-04-20 MED ORDER — NITROGLYCERIN 0.4 MG SL SUBL: 0.4000 mg | SUBLINGUAL_TABLET | SUBLINGUAL | Status: DC | PRN

## 2016-04-20 MED ORDER — ACETAMINOPHEN 325 MG PO TABS: 650.0000 mg | ORAL_TABLET | ORAL | Status: DC | PRN

## 2016-04-20 MED ORDER — TETANUS-DIPHTH-ACELL PERTUSSIS 5-2.5-18.5 LF-MCG/0.5 IM SUSP
0.5000 mL | Freq: Once | INTRAMUSCULAR | Status: AC
  Administered 2016-04-20: 0.5 mL via INTRAMUSCULAR
  Filled 2016-04-20: qty 0.5

## 2016-04-20 MED ORDER — RIVAROXABAN 20 MG PO TABS
20.0000 mg | ORAL_TABLET | Freq: Every day | ORAL | Status: DC
  Administered 2016-04-20 – 2016-04-22 (×3): 20 mg via ORAL
  Filled 2016-04-20 (×3): qty 1

## 2016-04-20 MED ORDER — LEVOFLOXACIN 750 MG PO TABS
750.0000 mg | ORAL_TABLET | Freq: Every day | ORAL | Status: DC
  Administered 2016-04-21: 750 mg via ORAL
  Filled 2016-04-20: qty 1

## 2016-04-20 MED ORDER — LEVOFLOXACIN IN D5W 750 MG/150ML IV SOLN
750.0000 mg | Freq: Once | INTRAVENOUS | Status: AC
  Administered 2016-04-20: 750 mg via INTRAVENOUS
  Filled 2016-04-20: qty 150

## 2016-04-20 MED ORDER — DILTIAZEM HCL-DEXTROSE 100-5 MG/100ML-% IV SOLN (PREMIX)
5.0000 mg/h | INTRAVENOUS | Status: DC
  Administered 2016-04-20: 2.5 mg/h via INTRAVENOUS
  Filled 2016-04-20 (×2): qty 100

## 2016-04-20 MED ORDER — ONDANSETRON HCL 4 MG/2ML IJ SOLN: 4.0000 mg | Freq: Four times a day (QID) | INTRAMUSCULAR | Status: DC | PRN

## 2016-04-20 MED ORDER — ATORVASTATIN CALCIUM 10 MG PO TABS
10.0000 mg | ORAL_TABLET | Freq: Every day | ORAL | Status: DC
  Administered 2016-04-21 – 2016-04-22 (×2): 10 mg via ORAL
  Filled 2016-04-20 (×2): qty 1

## 2016-04-20 NOTE — ED Notes (Signed)
D-dimer sent down as add on.

## 2016-04-20 NOTE — ED Notes (Signed)
This RN Spoke with PA Strader regarding cardizem, made PA aware that patient's HR is consistenly in the 90's, PA advised to start Cardizem at 2.5

## 2016-04-20 NOTE — ED Triage Notes (Addendum)
Night before last started having chest pain- thought was indigestion- took gas x and felt better. Patient reports dull intermittant pain that has progressively getting more constant. Stent in left descending 2011 placed in [place]. Also has some more blockage. Denies any other symptoms; no nausea, vomiting, diaphoresis. Dizziness reported and feels like heart is in arrhythmia.

## 2016-04-20 NOTE — ED Notes (Signed)
Went to pts room to assit he to restroom the patient, sat on side of bed before standing.pt started walking towards the door .GOT OUTSIDE THE DOOR PT HAD DIZZY SPELL ,BENT OVER  HOLDING ON RAIL.ASK [facility] TECH TO GET ME A WHEELCHAIR .ASSITBY DR BELFI  AND I INTO WEELCHAIR the patient ,STARTED MAKING A SNORING SOUND WAS OUT FOR AFEW SECONDS,.PT.CAME OUT OF IT BUT [place] REMEMBER WHAT HAPPEN .HE IN THE BED GAVE PT A URINAL TO USE,

## 2016-04-20 NOTE — ED Notes (Signed)
Patient transported to X-ray 

## 2016-04-20 NOTE — H&P (Signed)
History & Physical    Patient ID: Jerry Barr MRN: [id number], DOB/AGE: 01/24/1941   Admit date: 04/20/2016  Primary Physician: Gara Kroner, MD Primary Cardiologist: Dr. Martinique   History of Present Illness    Jerry Barr is a 75 y.o. male with past medical history of CAD (s/p DES to LAD in 2011), HLD, and chronic hypotension who presents to [facility] ED on 04/20/2016 for evaluation of palpitations, fatigue, and chest pain.   Patient reports he returned from a trip to [place] with his son two weeks ago and has been feeling under the weather, having a productive cough with green phlegm. Thought he likely had bronchitis. Starting two nights ago, he developed indigestion he thought was secondary to consuming acidic BBQ. He took Gas-X with relief in his symptoms within 30 minutes. The following morning, he felt weak and could feel his heart racing. He felt like he was going to faint if he lifted his head. Rested around his home and was not overly active.   Today, he awoke and continued to have palpitations with a mild pressure in his sternum. Felt his heart was irregular. He continued to feel dizzy and lightheaded which prompted him to come to the ED.   He says his symptoms do not resemble his previous anginal symptoms in 2011, for then he had a syncopal event. However, while in the ED, he did have one episode of syncope in walking to the restroom. Was assisted back to bed.   Denies any recent exertional chest pain or dyspnea with exertion. Walks 30-40 minutes daily without symptoms. Says he "sprinted" in the airport two weeks ago and denies any symptoms at that time.  Initial labs show a WBC of 9.6, Hgb 14.3, and 301. K+ 4.3 and creatinine 1.06. Initial troponin negative. EKG shows atrial fibrillation, HR 96, with slight ST depression in inferior leads. CXR shows nodular airspace opacity at the medial right base. This may represent developing infection, and correlation with lab values  and presentation may be useful. Recommend follow-up chest x-ray once the patient has been treated for this acute process to assure resolution. CT Head with no evidence of intracranial injury.   Last ischemic evaluation was a NST in 10/2014 which showed no evidence of ischemia. Home medications include ASA 81mg  daily and Lipitor 10mg  daily. Not on BB secondary to history of hypotension.   Past Medical History    Past Medical History:  Diagnosis Date  . BPH (benign prostatic hyperplasia)   . Coronary artery disease    Promus drug-eluting stent to a 99% mid LAD, 40-50% narrowing in the distal LAD with mild to moderate disease in the circ and RCA  . Dyslipidemia   . ED (erectile dysfunction)   . Hyperplastic colon polyp   . Hypoglycemia     Past Surgical History:  Procedure Laterality Date  . CORONARY ANGIOPLASTY WITH STENT PLACEMENT       Allergies  Allergies  Allergen Reactions  . Penicillins     States at one time he was allergic but has had it since and had no problem.      Home Medications    Prior to Admission medications   Medication Sig Start Date End Date Taking? Authorizing Provider  aspirin 81 MG tablet Take 81 mg by mouth daily.    Historical Provider, MD  atorvastatin (LIPITOR) 10 MG tablet TAKE 1 TABLET (10 MG TOTAL) BY MOUTH DAILY. 12/09/15   Jolaine Artist, MD  Cholecalciferol (VITAMIN  D-3) 1000 UNITS CAPS Take 1,000 Units by mouth daily.    Historical Provider, MD  fluticasone (FLONASE) 50 MCG/ACT nasal spray Place 2 sprays into both nostrils as needed for allergies or rhinitis.    Historical Provider, MD  loratadine (CLARITIN) 10 MG tablet Take 10 mg by mouth daily.    Historical Provider, MD  Multiple Vitamin (MULTIVITAMIN) tablet Take 1 tablet by mouth daily.    Historical Provider, MD  nitroGLYCERIN (NITROSTAT) 0.4 MG SL tablet Place 1 tablet (0.4 mg total) under the tongue every 5 (five) minutes as needed for chest pain. 10/29/14   Shaune Pascal Bensimhon, MD    Psyllium (METAMUCIL PO) Take by mouth.    Historical Provider, MD    Family History    Family History  Problem Relation Age of Onset  . CAD Father     Social History    Social History   Social History  . Marital status: Married    Spouse name: N/A  . Number of children: N/A  . Years of education: N/A   Occupational History  . Not on file.   Social History Main Topics  . Smoking status: Never Smoker  . Smokeless tobacco: Not on file  . Alcohol use Not on file  . Drug use: No  . Sexual activity: Not on file   Other Topics Concern  . Not on file   Social History Narrative  . No narrative on file     Review of Systems    General:  No chills, fever, night sweats or weight changes.  Cardiovascular:  No dyspnea on exertion, edema, orthopnea,  paroxysmal nocturnal dyspnea. Positive for chest pain and palpitations. Dermatological: No rash, lesions/masses Respiratory: No cough, dyspnea Urologic: No hematuria, dysuria Abdominal:   No nausea, vomiting, diarrhea, bright red blood per rectum, melena, or hematemesis. Neurologic:  No visual changes, wkns, changes in mental status. Positive for syncopal episode and dizziness.  All other systems reviewed and are otherwise negative except as noted above.  Physical Exam    Blood pressure 126/93, pulse 94, temperature 97.6 F (36.4 C), temperature source Oral, resp. rate 15, SpO2 99 %.  General: Well developed, well nourished,male appearing in no acute distress. Head: Normocephalic, atraumatic, sclera non-icteric, no xanthomas, nares are without discharge.  Neck: No carotid bruits. JVD not elevated.  Lungs: Respirations regular and unlabored, without wheezes or rales.  Heart: Irregularly irregular, tachycardiac. No S3 or S4.  No murmur, no rubs, or gallops appreciated. Abdomen: Soft, non-tender, non-distended with normoactive bowel sounds. No hepatomegaly. No rebound/guarding. No obvious abdominal masses. Msk:  Strength and  tone appear normal for age. No joint deformities or effusions. Extremities: No clubbing or cyanosis. No edema.  Distal pedal pulses are 2+ bilaterally. Neuro: Alert and oriented X 3. Moves all extremities spontaneously. No focal deficits noted. Psych:  Responds to questions appropriately with a normal affect. Skin: No rashes or lesions noted  Labs    Troponin [facility] of Care Test)  Recent Labs  04/20/16 0758  TROPIPOC 0.00   No results for input(s): CKTOTAL, CKMB, TROPONINI in the last 72 hours. Lab Results  Component Value Date   WBC 9.6 04/20/2016   HGB 14.3 04/20/2016   HCT 43.4 04/20/2016   MCV 92.9 04/20/2016   PLT 301 04/20/2016     Recent Labs Lab 04/20/16 0745  NA 138  K 4.3  CL 107  CO2 25  BUN 18  CREATININE 1.06  CALCIUM 8.8*  GLUCOSE 102*  Radiology Studies    Ct Head Wo Contrast  Result Date: 04/20/2016 CLINICAL DATA:  Fall after jumped from bed. Initial encounter. EXAM: CT HEAD WITHOUT CONTRAST TECHNIQUE: Contiguous axial images were obtained from the base of the skull through the vertex without intravenous contrast. COMPARISON:  07/04/2012 FINDINGS: Brain: No evidence of acute infarction, hemorrhage, hydrocephalus, extra-axial collection or mass lesion/mass effect. Vascular: No hyperdense vessel or unexpected calcification. Skull: Negative for fracture Sinuses/Orbits: Negative IMPRESSION: No evidence of intracranial injury. Electronically Signed   By: Monte Fantasia M.D.   On: 04/20/2016 08:29   Dg Chest Port 1 View  Result Date: 04/20/2016 CLINICAL DATA:  75 year old male with a history of pain EXAM: PORTABLE CHEST 1 VIEW COMPARISON:  09/30/2015 FINDINGS: Cardiomediastinal silhouette unchanged in size and contour. Calcifications of the aortic arch. Nodular airspace opacity at the medial right base new from the prior. No pleural effusion or pneumothorax. Chronic lung changes, similar to the comparison. No displaced fracture. IMPRESSION: Nodular  airspace opacity at the medial right base. This may represent developing infection, and correlation with lab values and presentation may be useful. Recommend follow-up chest x-ray once the patient has been treated for this acute process to assure resolution. Also, referral for pulmonary evaluation and consideration of lung cancer screening CT may be considered, as low-dose CT lung cancer screening is recommended for patients who are 65-14 years of age with a 30+ pack-year history of smoking, and who are currently smoking or quit <=15 years ago, as this patient may qualify. Aortic atherosclerosis. Signed, Dulcy Fanny. Earleen Newport, DO [facility] Radiology Electronically Signed   By: Corrie Mckusick D.O.   On: 04/20/2016 08:07   Dg Knee Complete 4 Views Left  Result Date: 04/20/2016 CLINICAL DATA:  Fall from bed with left anterior knee pain and abrasion. Initial encounter. EXAM: LEFT KNEE - COMPLETE 4+ VIEW COMPARISON:  None. FINDINGS: No evidence of fracture, dislocation, or joint effusion. No opaque foreign body. IMPRESSION: Negative. Electronically Signed   By: Monte Fantasia M.D.   On: 04/20/2016 08:54    EKG & Cardiac Imaging    EKG: Atrial fibrillation, HR 96, with slight ST depression in inferior leads.  ECHOCARDIOGRAM: None on File  Assessment & Plan    1. New Onset Atrial Fibrillation with RVR - has noted worsening fatigue, lightheadedness, and palpitations for the past two days. Reports occasional episodes in the past but says he thought it was secondary to hypotension.  - initial EKG shows atrial fibrillation with HR of 96. HR varying from 120's - 160's during encounter. - will check d-dimer with recent travel. Cycle cardiac enzymes. Check Mg and TSH. - This patients CHA2DS2-VASc Score and unadjusted Ischemic Stroke Rate (% per year) is equal to 2.2 % stroke rate/year from a score of 2 (CAD, Age). Started on Xarelto 20mg  daily.  - discussed with  Dr. Radford Pax, will initiate Cardizem drip without bolus. If becomes significantly hypotensive, will need to consider addition of Amiodarone.  - would anticipate TEE/DCCV on Thursday if he does not convert prior to this.  2. Atypical Chest Pain/ CAD - s/p DES to LAD in 2011. NST in 10/2014 with no evidence of ischemia. - patient reports chest discomfort relieved with Gas-X. Developed a centralized chest pressure this AM with elevated HR. Denies any exertional symptoms. Walks for 30-45 minutes daily without symptoms. Recently sprinted in airport without symptoms. - initial troponin negative. Will cycle cardiac enzymes.  3. Right Lung Opacity - CXR shows  nodular airspace opacity at the medial right base. This may represent developing infection. - WBC count normal. - started on Levaquin while in the ED. Will continue. Needs to follow-up with PCP as an outpatient.  4. Chronic Hypotension - Patient reports SBP in the 80's at home. - BP has been 106/73 - 126/93 while in the ED.  Signed, Erma Heritage, PA-C 04/20/2016, 12:10 PM Pager: [phone]

## 2016-04-20 NOTE — ED Notes (Signed)
Attempted to take PT to restroom via wheelchair. Pt stood up and stated he was very dizzy and immediately sat down.  Bed side commode was put in place. Pt tolerated well

## 2016-04-20 NOTE — ED Provider Notes (Signed)
[place] DEPT Provider Note   CSN: [id number] Arrival date & time: 04/20/16  0705     History   Chief Complaint Chief Complaint  Patient presents with  . Chest Pain    HPI Jerry Barr is a 75 y.o. male.  Patient is a 75 year old male with a history of coronary artery disease status post stent placement in 2011. He also has a history of hyperlipidemia. He presents with a 2 day history of worsening indigestion. It's been intermittent. He's been taking Gas-X with some improvement in symptoms. He's also had some intermittent slight discomfort of his chest to the center of his chest. It's nonradiating. He's been feeling very fatigued. No shortness of breath. No nausea or vomiting. No diaphoresis. He's also felt like his heart is been jumping. He denies any past history of atrial fibrillation. He's followed by Dr. Peter Martinique. Also of note, he states occasionally he relaxes dreams during the night. Last night he had a dream that he was jumping over a pond and due to that, he fell out of bed and bumped his head on the floor. He denies any neck or back pain. He does have some pain in his left knee related to that incident. He denies any other injuries. He is not on anticoagulants.  He does say that when he presented with his episode in 2011 when he had the stent placed, his primary symptom was indigestion. He currently feels generally weak but denies any unilateral weakness. No speech deficits or vision changes.    Chest Pain   Associated symptoms include cough (recent URI) and weakness (generalized). Pertinent negatives include no abdominal pain, no back pain, no diaphoresis, no dizziness, no fever, no headaches, no nausea, no numbness, no shortness of breath and no vomiting.    Past Medical History:  Diagnosis Date  . BPH (benign prostatic hyperplasia)   . Coronary artery disease    Promus drug-eluting stent to a 99% mid LAD, 40-50% narrowing in the distal LAD with mild to moderate  disease in the circ and RCA  . Dyslipidemia   . ED (erectile dysfunction)   . Hyperplastic colon polyp   . Hypoglycemia     Patient Active Problem List   Diagnosis Date Noted  . New onset atrial fibrillation ([place]) 04/20/2016  . Palpitations 04/20/2016  . Opacity of lung on imaging study 04/20/2016  . Dyslipidemia   . CAD (coronary artery disease), native coronary artery 10/29/2014    Past Surgical History:  Procedure Laterality Date  . CORONARY ANGIOPLASTY WITH STENT PLACEMENT         Home Medications    Prior to Admission medications   Medication Sig Start Date End Date Taking? Authorizing Provider  aspirin 81 MG tablet Take 81 mg by mouth daily.   Yes Historical Provider, MD  atorvastatin (LIPITOR) 10 MG tablet TAKE 1 TABLET (10 MG TOTAL) BY MOUTH DAILY. 12/09/15  Yes Jolaine Artist, MD  Cholecalciferol (VITAMIN D-3) 1000 UNITS CAPS Take 1,000 Units by mouth daily.   Yes Historical Provider, MD  loratadine (CLARITIN) 10 MG tablet Take 10 mg by mouth daily.   Yes Historical Provider, MD  Multiple Vitamin (MULTIVITAMIN) tablet Take 1 tablet by mouth daily.   Yes Historical Provider, MD  Psyllium (METAMUCIL PO) Take 1 scoop by mouth.    Yes Historical Provider, MD  nitroGLYCERIN (NITROSTAT) 0.4 MG SL tablet Place 1 tablet (0.4 mg total) under the tongue every 5 (five) minutes as needed for chest pain. 10/29/14  Jolaine Artist, MD    Family History Family History  Problem Relation Age of Onset  . CAD Father     Social History Social History  Substance Use Topics  . Smoking status: Never Smoker  . Smokeless tobacco: Not on file  . Alcohol use Not on file     Allergies   Penicillins   Review of Systems Review of Systems  Constitutional: Positive for fatigue. Negative for chills, diaphoresis and fever.  HENT: Negative for congestion, rhinorrhea and sneezing.   Eyes: Negative.   Respiratory: Positive for cough (recent URI). Negative for chest tightness  and shortness of breath.   Cardiovascular: Positive for chest pain. Negative for leg swelling.  Gastrointestinal: Negative for abdominal pain, blood in stool, diarrhea, nausea and vomiting.  Genitourinary: Negative for difficulty urinating, flank pain, frequency and hematuria.  Musculoskeletal: Negative for arthralgias and back pain.  Skin: Negative for rash.  Neurological: Positive for weakness (generalized). Negative for dizziness, speech difficulty, numbness and headaches.     Physical Exam Updated Vital Signs BP 111/78 (BP Location: Right Arm)   Pulse 97   Temp 97.6 F (36.4 C) (Oral)   Resp 19   SpO2 96%   Physical Exam  Constitutional: He is oriented to person, place, and time. He appears well-developed and well-nourished.  HENT:  Head: Normocephalic.  Small abrasion to the right parietal scalp area  Eyes: Pupils are equal, round, and reactive to light.  Neck: Normal range of motion. Neck supple.  No pain to the cervical thoracic or lumbosacral spine  Cardiovascular: Normal rate, regular rhythm and normal heart sounds.   Pulmonary/Chest: Effort normal and breath sounds normal. No respiratory distress. He has no wheezes. He has no rales. He exhibits no tenderness.  Abdominal: Soft. Bowel sounds are normal. There is no tenderness. There is no rebound and no guarding.  Musculoskeletal: Normal range of motion. He exhibits no edema.  Patient has pain on palpation and range of motion of the left knee. There is overlying abrasions. No swelling or effusion is noted. There is no pain to the hip or ankle. Pedal pulses are intact. No other pain on palpation or range of motion of extremities.  Lymphadenopathy:    He has no cervical adenopathy.  Neurological: He is alert and oriented to person, place, and time.  Motor 5/5 all extremities Sensation grossly intact to LT all extremities Finger to Nose intact, no pronator drift CN II-XII grossly intact Gait normal   Skin: Skin is warm  and dry. No rash noted.  Psychiatric: He has a normal mood and affect.     ED Treatments / Results  Labs (all labs ordered are listed, but only abnormal results are displayed) Results for orders placed or performed during the hospital encounter of [id number]  Basic metabolic panel  Result Value Ref Range   Sodium 138 135 - 145 mmol/L   Potassium 4.3 3.5 - 5.1 mmol/L   Chloride 107 101 - 111 mmol/L   CO2 25 22 - 32 mmol/L   Glucose, Bld 102 (H) 65 - 99 mg/dL   BUN 18 6 - 20 mg/dL   Creatinine, Ser 1.06 0.61 - 1.24 mg/dL   Calcium 8.8 (L) 8.9 - 10.3 mg/dL   GFR calc non Af Amer >60 >60 mL/min   GFR calc Af Amer >60 >60 mL/min   Anion gap 6 5 - 15  CBC with Differential  Result Value Ref Range   WBC 9.6 4.0 - 10.5 K/uL  RBC 4.67 4.22 - 5.81 MIL/uL   Hemoglobin 14.3 13.0 - 17.0 g/dL   HCT 43.4 39.0 - 52.0 %   MCV 92.9 78.0 - 100.0 fL   MCH 30.6 26.0 - 34.0 pg   MCHC 32.9 30.0 - 36.0 g/dL   RDW 13.6 11.5 - 15.5 %   Platelets 301 150 - 400 K/uL   Neutrophils Relative % 78 %   Neutro Abs 7.5 1.7 - 7.7 K/uL   Lymphocytes Relative 13 %   Lymphs Abs 1.2 0.7 - 4.0 K/uL   Monocytes Relative 7 %   Monocytes Absolute 0.7 0.1 - 1.0 K/uL   Eosinophils Relative 2 %   Eosinophils Absolute 0.2 0.0 - 0.7 K/uL   Basophils Relative 0 %   Basophils Absolute 0.0 0.0 - 0.1 K/uL  D-dimer, quantitative (not at [facility])  Result Value Ref Range   D-Dimer, Quant 0.52 (H) 0.00 - 0.50 ug/mL-FEU  I-stat troponin, ED  Result Value Ref Range   Troponin i, poc 0.00 0.00 - 0.08 ng/mL   Comment 3           Ct Head Wo Contrast  Result Date: 04/20/2016 CLINICAL DATA:  Fall after jumped from bed. Initial encounter. EXAM: CT HEAD WITHOUT CONTRAST TECHNIQUE: Contiguous axial images were obtained from the base of the skull through the vertex without intravenous contrast. COMPARISON:  07/04/2012 FINDINGS: Brain: No evidence of acute infarction, hemorrhage, hydrocephalus, extra-axial collection or mass  lesion/mass effect. Vascular: No hyperdense vessel or unexpected calcification. Skull: Negative for fracture Sinuses/Orbits: Negative IMPRESSION: No evidence of intracranial injury. Electronically Signed   By: Monte Fantasia M.D.   On: 04/20/2016 08:29   Dg Chest Port 1 View  Result Date: 04/20/2016 CLINICAL DATA:  75 year old male with a history of pain EXAM: PORTABLE CHEST 1 VIEW COMPARISON:  09/30/2015 FINDINGS: Cardiomediastinal silhouette unchanged in size and contour. Calcifications of the aortic arch. Nodular airspace opacity at the medial right base new from the prior. No pleural effusion or pneumothorax. Chronic lung changes, similar to the comparison. No displaced fracture. IMPRESSION: Nodular airspace opacity at the medial right base. This may represent developing infection, and correlation with lab values and presentation may be useful. Recommend follow-up chest x-ray once the patient has been treated for this acute process to assure resolution. Also, referral for pulmonary evaluation and consideration of lung cancer screening CT may be considered, as low-dose CT lung cancer screening is recommended for patients who are 59-46 years of age with a 30+ pack-year history of smoking, and who are currently smoking or quit <=15 years ago, as this patient may qualify. Aortic atherosclerosis. Signed, Dulcy Fanny. Earleen Newport, DO [facility] Radiology Electronically Signed   By: Corrie Mckusick D.O.   On: 04/20/2016 08:07   Dg Knee Complete 4 Views Left  Result Date: 04/20/2016 CLINICAL DATA:  Fall from bed with left anterior knee pain and abrasion. Initial encounter. EXAM: LEFT KNEE - COMPLETE 4+ VIEW COMPARISON:  None. FINDINGS: No evidence of fracture, dislocation, or joint effusion. No opaque foreign body. IMPRESSION: Negative. Electronically Signed   By: Monte Fantasia M.D.   On: 04/20/2016 08:54     EKG  EKG Interpretation  Date/Time:  Tuesday April 20 2016 07:12:01 EDT Ventricular Rate:  96 PR Interval:    QRS Duration: 84 QT Interval:  315 QTC Calculation: 398 R Axis:   46 Text Interpretation:  Atrial fibrillation Minimal ST depression, diffuse leads changed from prior EKG Confirmed by  Jalecia Leon  MD, [place] [phone]) on 04/20/2016 7:33:18 AM       Radiology Ct Head Wo Contrast  Result Date: 04/20/2016 CLINICAL DATA:  Fall after jumped from bed. Initial encounter. EXAM: CT HEAD WITHOUT CONTRAST TECHNIQUE: Contiguous axial images were obtained from the base of the skull through the vertex without intravenous contrast. COMPARISON:  07/04/2012 FINDINGS: Brain: No evidence of acute infarction, hemorrhage, hydrocephalus, extra-axial collection or mass lesion/mass effect. Vascular: No hyperdense vessel or unexpected calcification. Skull: Negative for fracture Sinuses/Orbits: Negative IMPRESSION: No evidence of intracranial injury. Electronically Signed   By: Monte Fantasia M.D.   On: 04/20/2016 08:29   Dg Chest Port 1 View  Result Date: 04/20/2016 CLINICAL DATA:  76 year old male with a history of pain EXAM: PORTABLE CHEST 1 VIEW COMPARISON:  09/30/2015 FINDINGS: Cardiomediastinal silhouette unchanged in size and contour. Calcifications of the aortic arch. Nodular airspace opacity at the medial right base new from the prior. No pleural effusion or pneumothorax. Chronic lung changes, similar to the comparison. No displaced fracture. IMPRESSION: Nodular airspace opacity at the medial right base. This may represent developing infection, and correlation with lab values and presentation may be useful. Recommend follow-up chest x-ray once the patient has been treated for this acute process to assure resolution. Also, referral for pulmonary evaluation and consideration of lung cancer screening CT may be considered, as low-dose CT lung cancer screening is recommended for patients who are 77-1 years of age with a 30+ pack-year history of smoking, and who  are currently smoking or quit <=15 years ago, as this patient may qualify. Aortic atherosclerosis. Signed, Dulcy Fanny. Earleen Newport, DO [facility] Radiology Electronically Signed   By: Corrie Mckusick D.O.   On: 04/20/2016 08:07   Dg Knee Complete 4 Views Left  Result Date: 04/20/2016 CLINICAL DATA:  Fall from bed with left anterior knee pain and abrasion. Initial encounter. EXAM: LEFT KNEE - COMPLETE 4+ VIEW COMPARISON:  None. FINDINGS: No evidence of fracture, dislocation, or joint effusion. No opaque foreign body. IMPRESSION: Negative. Electronically Signed   By: Monte Fantasia M.D.   On: 04/20/2016 08:54    Procedures Procedures (including critical care time)  Medications Ordered in ED Medications  diltiazem (CARDIZEM) 100 mg in dextrose 5% 153mL (1 mg/mL) infusion (0 mg/hr Intravenous Hold 04/20/16 1249)  rivaroxaban (XARELTO) tablet 20 mg (not administered)  levofloxacin (LEVAQUIN) tablet 750 mg (not administered)  levofloxacin (LEVAQUIN) IVPB 750 mg (0 mg Intravenous Stopped 04/20/16 1213)  Tdap (BOOSTRIX) injection 0.5 mL (0.5 mLs Intramuscular Given 04/20/16 0931)  0.9 %  sodium chloride infusion ( Intravenous Stopped 04/20/16 1213)     Initial Impression / Assessment and Plan / ED Course  I have reviewed the triage vital signs and the nursing notes.  Pertinent labs & imaging results that were available during my care of the patient were reviewed by me and considered in my medical decision making (see chart for details).  Clinical Course  Comment By Time  PT stood up to get in wheelchair to go to the bathroom and had a brief (30 sec) episode of unresponsiveness.  HR increased to 130s.  Was taken back to bed, BP 153/66.  Now fully alert, oriented.  Feels fine.  Normal neuro exam. Malvin Johns, MD 10/10 [phone]    EKG shows new onset a-fib with controlled rate.  Pt pain free now.  Trop neg.  Will consult cards.  Given his fall and potential  for anticoagulation, I did do  a head CT which is neg for bleed.  CXR shows right side nodule vs infection.  Will start on abx, advised pt he will need f/u monitoring on this by his PCP.  12:52 Cardiology has seen the patient and will admit for further treatment.  Final Clinical Impressions(s) / ED Diagnoses   Final diagnoses:  Chest pain, unspecified type  Syncope, unspecified syncope type    New Prescriptions New Prescriptions   No medications on file     Malvin Johns, MD 04/20/16 1252

## 2016-04-20 NOTE — Progress Notes (Signed)
Patient arrived the unit from the ER on a hospital bed, assessment completed see flowsheet, placed on tele ccmd notified, patient oriented to room and staff, bed in lowest position  call light within reach will continue to monitor

## 2016-04-20 NOTE — ED Notes (Signed)
MD at bedside. 

## 2016-04-21 LAB — BASIC METABOLIC PANEL
Anion gap: 7 (ref 5–15)
BUN: 16 mg/dL (ref 6–20)
CALCIUM: 8.5 mg/dL — AB (ref 8.9–10.3)
CO2: 26 mmol/L (ref 22–32)
CREATININE: 0.99 mg/dL (ref 0.61–1.24)
Chloride: 106 mmol/L (ref 101–111)
GFR calc Af Amer: >60 mL/min (ref >60)
GFR calc non Af Amer: >60 mL/min (ref >60)
GLUCOSE: 95 mg/dL (ref 65–99)
Potassium: 4.3 mmol/L (ref 3.5–5.1)
Sodium: 139 mmol/L (ref 135–145)

## 2016-04-21 LAB — CBC
HCT: 43.3 % (ref 39.0–52.0)
Hemoglobin: 14.6 g/dL (ref 13.0–17.0)
MCH: 30.5 pg (ref 26.0–34.0)
MCHC: 33.7 g/dL (ref 30.0–36.0)
MCV: 90.4 fL (ref 78.0–100.0)
PLATELETS: 330 10*3/uL (ref 150–400)
RBC: 4.79 MIL/uL (ref 4.22–5.81)
RDW: 13.5 % (ref 11.5–15.5)
WBC: 9.9 10*3/uL (ref 4.0–10.5)

## 2016-04-21 LAB — TROPONIN I
TROPONIN I: 0.03 ng/mL — AB (ref <0.03)
Troponin I: 0.03 ng/mL (ref <0.03)

## 2016-04-21 MED ORDER — AMIODARONE HCL IN DEXTROSE 360-4.14 MG/200ML-% IV SOLN
60.0000 mg/h | INTRAVENOUS | Status: AC
  Administered 2016-04-21: 60 mg/h via INTRAVENOUS
  Filled 2016-04-21: qty 200

## 2016-04-21 MED ORDER — DOXYCYCLINE HYCLATE 100 MG PO TABS
100.0000 mg | ORAL_TABLET | Freq: Two times a day (BID) | ORAL | Status: DC
  Administered 2016-04-21 – 2016-04-23 (×5): 100 mg via ORAL
  Filled 2016-04-21 (×5): qty 1

## 2016-04-21 MED ORDER — AMIODARONE HCL IN DEXTROSE 360-4.14 MG/200ML-% IV SOLN
30.0000 mg/h | INTRAVENOUS | Status: DC
  Administered 2016-04-21 – 2016-04-23 (×4): 30 mg/h via INTRAVENOUS
  Filled 2016-04-21 (×4): qty 200

## 2016-04-21 MED ORDER — INFLUENZA VAC SPLIT QUAD 0.5 ML IM SUSY
0.5000 mL | PREFILLED_SYRINGE | INTRAMUSCULAR | Status: AC
  Administered 2016-04-22: 0.5 mL via INTRAMUSCULAR
  Filled 2016-04-21: qty 0.5

## 2016-04-21 NOTE — Discharge Instructions (Addendum)
Atrial Fibrillation Atrial fibrillation is a type of heartbeat that is irregular or fast (rapid). If you have this condition, your heart keeps quivering in a weird (chaotic) way. This condition can make it so your heart cannot pump blood normally. Having this condition gives a person more risk for stroke, heart failure, and other heart problems. There are different types of atrial fibrillation. Talk with your doctor to learn about the type that you have. HOME CARE  Take over-the-counter and prescription medicines only as told by your doctor.  If your doctor prescribed a blood-thinning medicine, take it exactly as told. Taking too much of it can cause bleeding. If you do not take enough of it, you will not have the protection that you need against stroke and other problems.  Do not use any tobacco products. These include cigarettes, chewing tobacco, and e-cigarettes. If you need help quitting, ask your doctor.  If you have apnea (obstructive sleep apnea), manage it as told by your doctor.  Do not drink alcohol.  Do not drink beverages that have caffeine. These include coffee, soda, and tea.  Maintain a healthy weight. Do not use diet pills unless your doctor says they are safe for you. Diet pills may make heart problems worse.  Follow diet instructions as told by your doctor.  Exercise regularly as told by your doctor.  Keep all follow-up visits as told by your doctor. This is important. GET HELP IF:  You notice a change in the speed, rhythm, or strength of your heartbeat.  You are taking a blood-thinning medicine and you notice more bruising.  You get tired more easily when you move or exercise. GET HELP RIGHT AWAY IF:  You have pain in your chest or your belly (abdomen).  You have sweating or weakness.  You feel sick to your stomach (nauseous).  You notice blood in your throw up (vomit), poop (stool), or pee (urine).  You are short of breath.  You suddenly have swollen  feet and ankles.  You feel dizzy.  Your suddenly get weak or numb in your face, arms, or legs, especially if it happens on one side of your body.  You have trouble talking, trouble understanding, or both.  Your face or your eyelid droops on one side. These symptoms may be an emergency. Do not wait to see if the symptoms will go away. Get medical help right away. Call your local emergency services (911 in the [place] Do not drive yourself to the hospital.   This information is not intended to replace advice given to you by your health care provider. Make sure you discuss any questions you have with your health care provider.   Document Released: 04/06/2008 Document Revised: 03/19/2015 Document Reviewed: 10/23/2014 Elsevier Interactive Patient Education 2016 [place] on my medicine - XARELTO (Rivaroxaban)  This medication education was reviewed with me or my healthcare representative as part of my discharge preparation.  The pharmacist that spoke with me during my hospital stay was:  Arty Baumgartner, [facility]  Why was Xarelto prescribed for you? Xarelto was prescribed for you to reduce the risk of a blood clot forming that can cause a stroke if you have a medical condition called atrial fibrillation (a type of irregular heartbeat).  What do you need to know about xarelto ? Take your Xarelto ONCE DAILY at the same time every day with your evening meal. If you have difficulty swallowing the tablet whole, you may crush it and mix in  applesauce just prior to taking your dose.  Take Xarelto exactly as prescribed by your doctor and DO NOT stop taking Xarelto without talking to the doctor who prescribed the medication.  Stopping without other stroke prevention medication to take the place of Xarelto may increase your risk of developing a clot that causes a stroke.  Refill your prescription before you run out.  After discharge, you should have regular check-up appointments  with your healthcare provider that is prescribing your Xarelto.  In the future your dose may need to be changed if your kidney function or weight changes by a significant amount.  What do you do if you miss a dose? If you are taking Xarelto ONCE DAILY and you miss a dose, take it as soon as you remember on the same day then continue your regularly scheduled once daily regimen the next day. Do not take two doses of Xarelto at the same time or on the same day.   Important Safety Information A possible side effect of Xarelto is bleeding. You should call your healthcare provider right away if you experience any of the following: ? Bleeding from an injury or your nose that does not stop. ? Unusual colored urine (red or dark brown) or unusual colored stools (red or black). ? Unusual bruising for unknown reasons. ? A serious fall or if you hit your head (even if there is no bleeding).  Some medicines may interact with Xarelto and might increase your risk of bleeding while on Xarelto. To help avoid this, consult your healthcare provider or pharmacist prior to using any new prescription or non-prescription medications, including herbals, vitamins, non-steroidal anti-inflammatory drugs (NSAIDs) and supplements.  This website has more information on Xarelto: [email or web address].[email or web address]

## 2016-04-21 NOTE — Progress Notes (Addendum)
SUBJECTIVE:  No complaints  OBJECTIVE:   Vitals:   Vitals:   04/20/16 1811 04/20/16 2055 04/21/16 0351 04/21/16 0808  BP: 107/74 (!) 92/57 110/71 94/60  Pulse: 79 89 84 (!) 155  Resp: 16 18 18    Temp: 98.1 F (36.7 C) 98.1 F (36.7 C) 98.1 F (36.7 C)   TempSrc: Oral Oral Oral   SpO2: 99% 99% 99%   Weight: 157 lb 6.4 oz (71.4 kg)     Height: 6' (1.829 m)      I&O's:   Intake/Output Summary (Last 24 hours) at 04/21/16 1017 Last data filed at 04/21/16 0300  Gross per 24 hour  Intake           922.33 ml  Output              700 ml  Net           222.33 ml   TELEMETRY: Reviewed telemetry pt in atrial fibrillation  PHYSICAL EXAM General: Well developed, well nourished, in no acute distress Head: Eyes PERRLA, No xanthomas.   Normal cephalic and atramatic  Lungs:   Clear bilaterally to auscultation and percussion. Heart:   Irregularly irregular and tachy S1 S2 Pulses are 2+ & equal. Abdomen: Bowel sounds are positive, abdomen soft and non-tender without masses  Msk:  Back normal, normal gait. Normal strength and tone for age. Extremities:   No clubbing, cyanosis or edema.  DP +1 Neuro: Alert and oriented X 3. Psych:  Good affect, responds appropriately   LABS: Basic Metabolic Panel:  Recent Labs  04/20/16 0745 04/20/16 1821 04/21/16 0549  NA 138  --  139  K 4.3  --  4.3  CL 107  --  106  CO2 25  --  26  GLUCOSE 102*  --  95  BUN 18  --  16  CREATININE 1.06  --  0.99  CALCIUM 8.8*  --  8.5*  MG  --  2.3  --    Liver Function Tests: No results for input(s): AST, ALT, ALKPHOS, BILITOT, PROT, ALBUMIN in the last 72 hours. No results for input(s): LIPASE, AMYLASE in the last 72 hours. CBC:  Recent Labs  04/20/16 0745 04/21/16 0549  WBC 9.6 9.9  NEUTROABS 7.5  --   HGB 14.3 14.6  HCT 43.4 43.3  MCV 92.9 90.4  PLT 301 330   Cardiac Enzymes:  Recent Labs  04/20/16 1821 04/21/16 0012 04/21/16 0549  TROPONINI 0.03* 0.03* 0.03*   BNP: Invalid  input(s): POCBNP D-Dimer:  Recent Labs  04/20/16 0752  DDIMER 0.52*   Hemoglobin A1C: No results for input(s): HGBA1C in the last 72 hours. Fasting Lipid Panel: No results for input(s): CHOL, HDL, LDLCALC, TRIG, CHOLHDL, LDLDIRECT in the last 72 hours. Thyroid Function Tests:  Recent Labs  04/20/16 1821  TSH 0.571   Anemia Panel: No results for input(s): VITAMINB12, FOLATE, FERRITIN, TIBC, IRON, RETICCTPCT in the last 72 hours. Coag Panel:   No results found for: INR, PROTIME  RADIOLOGY: Ct Head Wo Contrast  Result Date: 04/20/2016 CLINICAL DATA:  Fall after jumped from bed. Initial encounter. EXAM: CT HEAD WITHOUT CONTRAST TECHNIQUE: Contiguous axial images were obtained from the base of the skull through the vertex without intravenous contrast. COMPARISON:  07/04/2012 FINDINGS: Brain: No evidence of acute infarction, hemorrhage, hydrocephalus, extra-axial collection or mass lesion/mass effect. Vascular: No hyperdense vessel or unexpected calcification. Skull: Negative for fracture Sinuses/Orbits: Negative IMPRESSION: No evidence of intracranial injury. Electronically Signed  By: Monte Fantasia M.D.   On: 04/20/2016 08:29   Dg Chest Port 1 View  Result Date: 04/20/2016 CLINICAL DATA:  75 year old male with a history of pain EXAM: PORTABLE CHEST 1 VIEW COMPARISON:  09/30/2015 FINDINGS: Cardiomediastinal silhouette unchanged in size and contour. Calcifications of the aortic arch. Nodular airspace opacity at the medial right base new from the prior. No pleural effusion or pneumothorax. Chronic lung changes, similar to the comparison. No displaced fracture. IMPRESSION: Nodular airspace opacity at the medial right base. This may represent developing infection, and correlation with lab values and presentation may be useful. Recommend follow-up chest x-ray once the patient has been treated for this acute process to assure resolution. Also, referral for pulmonary evaluation and  consideration of lung cancer screening CT may be considered, as low-dose CT lung cancer screening is recommended for patients who are 77-3 years of age with a 30+ pack-year history of smoking, and who are currently smoking or quit <=15 years ago, as this patient may qualify. Aortic atherosclerosis. Signed, Dulcy Fanny. Earleen Newport, DO [facility] Electronically Signed   By: Corrie Mckusick D.O.   On: 04/20/2016 08:07   Dg Knee Complete 4 Views Left  Result Date: 04/20/2016 CLINICAL DATA:  Fall from bed with left anterior knee pain and abrasion. Initial encounter. EXAM: LEFT KNEE - COMPLETE 4+ VIEW COMPARISON:  None. FINDINGS: No evidence of fracture, dislocation, or joint effusion. No opaque foreign body. IMPRESSION: Negative. Electronically Signed   By: Monte Fantasia M.D.   On: 04/20/2016 08:54    Assessment & Plan    1. New Onset Atrial Fibrillation with RVR - has noted worsening fatigue, lightheadedness, and palpitations for the past two days. Reports occasional episodes in the past but says he thought it was secondary to hypotension.  - initial EKG showed atrial fibrillation with HR of 96. HR varying from 120's - 160's this am despite low dose IV Cardizem - D-Dimer minimally elevated at 0.52 (normal 0.5) - unlikely to be PE and most likely elevated in setting of rapid afib.    - Trop minimally elevated at 0.03 with flat trend secondary to afib with RVR.  TSH normal.  - This patients CHA2DS2-VASc Score and unadjusted Ischemic Stroke Rate (% per year) is equal to 2.2 % stroke rate/year from a score of 2 (CAD, Age). Started on Xarelto 20mg  daily yesterday.  - Rate remains uncontrolled on Cardizem drip and cannot titrate further due to soft BP.  Will stop Cardizem and start IV Amio without bolus due to soft BP  - Plan for TEE/DCCV on Friday if he does not convert prior to this after 3 doses of Xarelto (no openings on schedule until Friday).  2.  Atypical Chest Pain/ CAD - s/p DES to LAD in 2011. NST in 10/2014 with no evidence of ischemia. - patient reports chest discomfort relieved with Gas-X. Developed a centralized chest pressure with elevated HR in afib likely from demand ischemia. Denies any exertional symptoms. Walks for 30-45 minutes daily without symptoms. [place] in airport without symptoms. - Minimal trop elevation with flat trend due to demand ischemia from afib with RVR.  Nuclear stress test a year ago with no ischemia.  No further ischemic workup at this time.   3. Right Lung Opacity - CXR shows nodular airspace opacity at the medial right base. This may represent developing infection. - WBC count normal. - started on Levaquin while in the ED. Since we are starting  Amio will change Levaquin to doxycycline 100mg  BID x 5 days. Needs to follow-up with PCP as an outpatient. - Needs followup Cxray once treatment for URI completed.  4. Chronic Hypotension - Patient reports SBP in the 80's at home.   Fransico Him, MD  04/21/2016  10:17 AM

## 2016-04-21 NOTE — Progress Notes (Signed)
  Amiodarone Drug - Drug Interaction Consult Note  Recommendations:   Watch QTc on Amiodarone, Levaquin and Loratidine. QTc 398 on EKG 04/20/16.   Could consider decreasing Levaquin to 500 mg PO daily, or limiting treatment to 5 days of 750 mg dose.  Amiodarone is metabolized by the cytochrome P450 system and therefore has the potential to cause many drug interactions. Amiodarone has an average plasma half-life of 50 days (range 20 to 100 days).   There is potential for drug interactions to occur several weeks or months after stopping treatment and the onset of drug interactions may be slow after initiating amiodarone.   [x]  Statins: Increased risk of myopathy. Simvastatin- restrict dose to 20mg  daily. Other statins: counsel patients to report any muscle pain or weakness immediately. - on Atorvastatin 10 mg daily, ok  [x]  Anticoagulants: Amiodarone can increase anticoagulant effect. Consider warfarin dose reduction. Patients should be monitored closely and the dose of anticoagulant altered accordingly, remembering that amiodarone levels take several weeks to stabilize. - on Xarelto 20 mg daily  []  Antiepileptics: Amiodarone can increase plasma concentration of phenytoin, the dose should be reduced. Note that small changes in phenytoin dose can result in large changes in levels. Monitor patient and counsel on signs of toxicity.  []  Beta blockers: increased risk of bradycardia, AV block and myocardial depression. Sotalol - avoid concomitant use.  []   Calcium channel blockers (diltiazem and verapamil): increased risk of bradycardia, AV block and myocardial depression.  []   Cyclosporine: Amiodarone increases levels of cyclosporine. Reduced dose of cyclosporine is recommended.  []  Digoxin dose should be halved when amiodarone is started.  []  Diuretics: increased risk of cardiotoxicity if hypokalemia occurs.  []  Oral hypoglycemic agents (glyburide, glipizide, glimepiride): increased risk of  hypoglycemia. Patient's glucose levels should be monitored closely when initiating amiodarone therapy.   [x]  Drugs that prolong the QT interval:  Torsades de pointes risk may be increased with concurrent use - avoid if possible.  Monitor QTc, also keep magnesium/potassium WNL if concurrent therapy can't be avoided. Marland Kitchen Antibiotics: e.g. fluoroquinolones, erythromycin. - on Levaquin 750 mg PO daily and Loratidine 10 mg daily.  Also prn Zofran, not using. . Antiarrhythmics: e.g. quinidine, procainamide, disopyramide, sotalol. . Antipsychotics: e.g. phenothiazines, haloperidol.  . Lithium, tricyclic antidepressants, and methadone.  Thank You,  Arty Baumgartner, [place] Pager: [phone] 04/21/2016 11:14 AM

## 2016-04-22 ENCOUNTER — Encounter (HOSPITAL_COMMUNITY): Payer: Self-pay

## 2016-04-22 ENCOUNTER — Inpatient Hospital Stay (HOSPITAL_COMMUNITY): Payer: Medicare Other

## 2016-04-22 DIAGNOSIS — I4891 Unspecified atrial fibrillation: Secondary | ICD-10-CM

## 2016-04-22 LAB — ECHOCARDIOGRAM COMPLETE
Height: 72 in
Weight: 2518.4 oz

## 2016-04-22 NOTE — Progress Notes (Signed)
SUBJECTIVE:  No complaints  OBJECTIVE:   Vitals:   Vitals:   04/21/16 1228 04/21/16 1412 04/21/16 1957 04/22/16 0600  BP: 96/62 95/62 91/62  103/65  Pulse: 95 (!) 50 74 78  Resp:  18 18 18   Temp:  98.2 F (36.8 C) 98.4 F (36.9 C) 97.8 F (36.6 C)  TempSrc:  Oral Oral Oral  SpO2:  98% 100% 98%  Weight:      Height:       I&O's:    Intake/Output Summary (Last 24 hours) at 04/22/16 1020 Last data filed at 04/22/16 0900  Gross per 24 hour  Intake          1173.44 ml  Output             2154 ml  Net          -980.56 ml   TELEMETRY: Reviewed telemetry pt in atrial fibrillation  PHYSICAL EXAM General: Well developed, well nourished, in no acute distress Head: Eyes PERRLA, No xanthomas.   Normal cephalic and atramatic  Lungs:   Clear bilaterally to auscultation and percussion. Heart:   Irregularly irregular and tachy S1 S2 Pulses are 2+ & equal. Abdomen: Bowel sounds are positive, abdomen soft and non-tender without masses  Msk:  Back normal, normal gait. Normal strength and tone for age. Extremities:   No clubbing, cyanosis or edema.  DP +1 Neuro: Alert and oriented X 3. Psych:  Good affect, responds appropriately   LABS: Basic Metabolic Panel:  Recent Labs  04/20/16 0745 04/20/16 1821 04/21/16 0549  NA 138  --  139  K 4.3  --  4.3  CL 107  --  106  CO2 25  --  26  GLUCOSE 102*  --  95  BUN 18  --  16  CREATININE 1.06  --  0.99  CALCIUM 8.8*  --  8.5*  MG  --  2.3  --    Liver Function Tests: No results for input(s): AST, ALT, ALKPHOS, BILITOT, PROT, ALBUMIN in the last 72 hours. No results for input(s): LIPASE, AMYLASE in the last 72 hours. CBC:  Recent Labs  04/20/16 0745 04/21/16 0549  WBC 9.6 9.9  NEUTROABS 7.5  --   HGB 14.3 14.6  HCT 43.4 43.3  MCV 92.9 90.4  PLT 301 330   Cardiac Enzymes:  Recent Labs  04/20/16 1821 04/21/16 0012 04/21/16 0549  TROPONINI 0.03* 0.03* 0.03*   BNP: Invalid input(s): POCBNP D-Dimer:  Recent  Labs  04/20/16 0752  DDIMER 0.52*   Hemoglobin A1C: No results for input(s): HGBA1C in the last 72 hours. Fasting Lipid Panel: No results for input(s): CHOL, HDL, LDLCALC, TRIG, CHOLHDL, LDLDIRECT in the last 72 hours. Thyroid Function Tests:  Recent Labs  04/20/16 1821  TSH 0.571   Anemia Panel: No results for input(s): VITAMINB12, FOLATE, FERRITIN, TIBC, IRON, RETICCTPCT in the last 72 hours. Coag Panel:   No results found for: INR, PROTIME  RADIOLOGY: Ct Head Wo Contrast  Result Date: 04/20/2016 CLINICAL DATA:  Fall after jumped from bed. Initial encounter. EXAM: CT HEAD WITHOUT CONTRAST TECHNIQUE: Contiguous axial images were obtained from the base of the skull through the vertex without intravenous contrast. COMPARISON:  07/04/2012 FINDINGS: Brain: No evidence of acute infarction, hemorrhage, hydrocephalus, extra-axial collection or mass lesion/mass effect. Vascular: No hyperdense vessel or unexpected calcification. Skull: Negative for fracture Sinuses/Orbits: Negative IMPRESSION: No evidence of intracranial injury. Electronically Signed   By: Monte Fantasia M.D.   On: 04/20/2016  08:29   Dg Chest Port 1 View  Result Date: 04/20/2016 CLINICAL DATA:  75 year old male with a history of pain EXAM: PORTABLE CHEST 1 VIEW COMPARISON:  09/30/2015 FINDINGS: Cardiomediastinal silhouette unchanged in size and contour. Calcifications of the aortic arch. Nodular airspace opacity at the medial right base new from the prior. No pleural effusion or pneumothorax. Chronic lung changes, similar to the comparison. No displaced fracture. IMPRESSION: Nodular airspace opacity at the medial right base. This may represent developing infection, and correlation with lab values and presentation may be useful. Recommend follow-up chest x-ray once the patient has been treated for this acute process to assure resolution. Also, referral for pulmonary evaluation and consideration of lung cancer screening CT may be  considered, as low-dose CT lung cancer screening is recommended for patients who are 2-3 years of age with a 30+ pack-year history of smoking, and who are currently smoking or quit <=15 years ago, as this patient may qualify. Aortic atherosclerosis. Signed, Dulcy Fanny. Earleen Newport, DO [facility] Electronically Signed   By: Corrie Mckusick D.O.   On: 04/20/2016 08:07   Dg Knee Complete 4 Views Left  Result Date: 04/20/2016 CLINICAL DATA:  Fall from bed with left anterior knee pain and abrasion. Initial encounter. EXAM: LEFT KNEE - COMPLETE 4+ VIEW COMPARISON:  None. FINDINGS: No evidence of fracture, dislocation, or joint effusion. No opaque foreign body. IMPRESSION: Negative. Electronically Signed   By: Monte Fantasia M.D.   On: 04/20/2016 08:54    Assessment & Plan    1. New Onset Atrial Fibrillation with RVR - has noted worsening fatigue, lightheadedness, and palpitations for the past two days. Reports occasional episodes in the past but says he thought it was secondary to hypotension.  - initial EKG showed atrial fibrillation with HR of 96. HR varying from 120's - 160's this am despite low dose IV Cardizem - D-Dimer minimally elevated at 0.52 (normal 0.5) - unlikely to be PE and most likely elevated in setting of rapid afib.    - Trop minimally elevated at 0.03 with flat trend secondary to afib with RVR.  TSH normal.  - This patients CHA2DS2-VASc Score and unadjusted Ischemic Stroke Rate (% per year) is equal to 2.2 % stroke rate/year from a score of 2 (CAD, Age). Started on Xarelto 20mg  daily on 10/10 (3rd dose today at 5pm). - Rate remained uncontrolled on Cardizem drip which was stopped and started on IV Amio without bolus and HR now controlled.  - Plan for TEE/DCCV on Friday if he does not convert prior to this after he has had 3 doses of Xarelto   2. Atypical Chest Pain/ CAD - s/p DES to LAD in 2011. NST in 10/2014 with no evidence of  ischemia. - patient reports chest discomfort relieved with Gas-X. Developed a centralized chest pressure with elevated HR in afib likely from demand ischemia. Denies any exertional symptoms. Walks for 30-45 minutes daily without symptoms. [place] airport without symptoms. - Minimal trop elevation with flat trend due to demand ischemia from afib with RVR.  Nuclear stress test a year ago with no ischemia.  No further ischemic workup at this time.   3. Right Lung Opacity - CXR shows nodular airspace opacity at the medial right base. This may represent developing infection. - WBC count normal. - started on Levaquin while in the ED. Since we are starting Amio will change Levaquin to doxycycline 100mg  BID x 5 days. Needs to follow-up  with PCP as an outpatient. - Needs followup Cxray once treatment for URI completed.  4. Chronic Hypotension - Patient reports SBP in the 80's at home and in 90-100's here.   Fransico Him, MD  04/22/2016  10:20 AM

## 2016-04-22 NOTE — Progress Notes (Signed)
  Echocardiogram 2D Echocardiogram has been performed.  Darlina Sicilian M 04/22/2016, 10:04 AM

## 2016-04-23 ENCOUNTER — Encounter (HOSPITAL_COMMUNITY): Payer: Self-pay | Admitting: *Deleted

## 2016-04-23 ENCOUNTER — Inpatient Hospital Stay (HOSPITAL_COMMUNITY): Payer: Medicare Other | Admitting: Certified Registered Nurse Anesthetist

## 2016-04-23 ENCOUNTER — Inpatient Hospital Stay (HOSPITAL_COMMUNITY): Payer: Medicare Other

## 2016-04-23 ENCOUNTER — Encounter (HOSPITAL_COMMUNITY)
Admission: EM | Disposition: A | Discharge status: Home / Self Care | Payer: Self-pay | Source: Home / Self Care | Attending: Cardiology

## 2016-04-23 DIAGNOSIS — I340 Nonrheumatic mitral (valve) insufficiency: Secondary | ICD-10-CM

## 2016-04-23 DIAGNOSIS — R002 Palpitations: Secondary | ICD-10-CM

## 2016-04-23 DIAGNOSIS — I4891 Unspecified atrial fibrillation: Secondary | ICD-10-CM

## 2016-04-23 HISTORY — PX: CARDIOVERSION: SHX1299

## 2016-04-23 HISTORY — PX: TEE WITHOUT CARDIOVERSION: SHX5443

## 2016-04-23 LAB — HEPATIC FUNCTION PANEL
ALBUMIN: 2.9 g/dL — AB (ref 3.5–5.0)
ALT: 12 U/L — ABNORMAL LOW (ref 17–63)
AST: 17 U/L (ref 15–41)
Alkaline Phosphatase: 45 U/L (ref 38–126)
Bilirubin, Direct: <0.1 mg/dL — ABNORMAL LOW (ref 0.1–0.5)
TOTAL PROTEIN: 5.6 g/dL — AB (ref 6.5–8.1)
Total Bilirubin: 0.7 mg/dL (ref 0.3–1.2)

## 2016-04-23 SURGERY — ECHOCARDIOGRAM, TRANSESOPHAGEAL
Anesthesia: Monitor Anesthesia Care

## 2016-04-23 MED ORDER — AMIODARONE HCL 200 MG PO TABS: 200.0000 mg | ORAL_TABLET | Freq: Two times a day (BID) | ORAL | 11 refills | Status: DC

## 2016-04-23 MED ORDER — SODIUM CHLORIDE 0.9 % IV SOLN
INTRAVENOUS | Status: DC
  Administered 2016-04-23 (×2): via INTRAVENOUS

## 2016-04-23 MED ORDER — PROPOFOL 500 MG/50ML IV EMUL
INTRAVENOUS | Status: DC | PRN
  Administered 2016-04-23: 120 ug/kg/min via INTRAVENOUS

## 2016-04-23 MED ORDER — PROPOFOL 10 MG/ML IV BOLUS
INTRAVENOUS | Status: DC | PRN
  Administered 2016-04-23 (×3): 20 mg via INTRAVENOUS

## 2016-04-23 MED ORDER — RIVAROXABAN 20 MG PO TABS: 20.0000 mg | ORAL_TABLET | Freq: Every day | ORAL | 11 refills | Status: DC

## 2016-04-23 MED ORDER — BUTAMBEN-TETRACAINE-BENZOCAINE 2-2-14 % EX AERO
INHALATION_SPRAY | CUTANEOUS | Status: DC | PRN
  Administered 2016-04-23: 2 via TOPICAL

## 2016-04-23 MED ORDER — RIVAROXABAN 20 MG PO TABS: 20.0000 mg | ORAL_TABLET | Freq: Every day | ORAL | 0 refills | Status: DC

## 2016-04-23 MED ORDER — DOXYCYCLINE MONOHYDRATE 100 MG PO CAPS: 100.0000 mg | ORAL_CAPSULE | Freq: Two times a day (BID) | ORAL | 0 refills | Status: AC

## 2016-04-23 NOTE — Anesthesia Preprocedure Evaluation (Addendum)
Anesthesia Evaluation  Patient identified by MRN, date of birth, ID band Patient awake    Reviewed: Allergy & Precautions, NPO status , Patient's Chart, lab work & pertinent test results  History of Anesthesia Complications Negative for: history of anesthetic complications  Airway Mallampati: II  TM Distance: >3 FB Neck ROM: Full    Dental  (+) Dental Advisory Given   Pulmonary neg shortness of breath, neg sleep apnea, neg COPD, Recent URI ,    Pulmonary exam normal breath sounds clear to auscultation       Cardiovascular (-) angina+ CAD and + Cardiac Stents (s/p DES to LAD in 2011. NST in 10/2014 with no evidence of ischemia.)  (-) Past MI, (-) CABG and (-) Orthopnea + dysrhythmias Atrial Fibrillation  Rhythm:Irregular Rate:Normal  HLD, hypotension   Neuro/Psych negative neurological ROS     GI/Hepatic negative GI ROS, Neg liver ROS,   Endo/Other  negative endocrine ROS  Renal/GU negative Renal ROS     Musculoskeletal   Abdominal   Peds  Hematology negative hematology ROS (+)   Anesthesia Other Findings BPH  Reproductive/Obstetrics                            Anesthesia Physical Anesthesia Plan  ASA: III  Anesthesia Plan: MAC   Post-op Pain Management:    Induction: Intravenous  Airway Management Planned: Natural Airway and Nasal Cannula  Additional Equipment:   Intra-op Plan:   Post-operative Plan:   Informed Consent: I have reviewed the patients History and Physical, chart, labs and discussed the procedure including the risks, benefits and alternatives for the proposed anesthesia with the patient or authorized representative who has indicated his/her understanding and acceptance.   Dental advisory given  Plan Discussed with: CRNA  Anesthesia Plan Comments:        Anesthesia Quick Evaluation

## 2016-04-23 NOTE — Progress Notes (Signed)
  Echocardiogram Echocardiogram Transesophageal has been performed.  Jerry Barr 04/23/2016, 12:19 PM

## 2016-04-23 NOTE — Transfer of Care (Signed)
Immediate Anesthesia Transfer of Care Note  Patient: Jerry Barr  Procedure(s) Performed: Procedure(s): TRANSESOPHAGEAL ECHOCARDIOGRAM (TEE) (N/A) CARDIOVERSION (N/A)  Patient Location: Endoscopy Unit  Anesthesia Type:MAC  Level of Consciousness: awake, alert , oriented and patient cooperative  Airway & Oxygen Therapy: Patient Spontanous Breathing and Patient connected to nasal cannula oxygen  Post-op Assessment: Report given to RN and Post -op Vital signs reviewed and stable  Post vital signs: Reviewed and stable  Last Vitals:  Vitals:   04/23/16 1042 04/23/16 1128  BP: 128/90 (!) 74/49  Pulse: 79 65  Resp: 15 14  Temp: 36.6 C     Last Pain:  Vitals:   04/23/16 1128  TempSrc: Oral  PainSc:          Complications: No apparent anesthesia complications

## 2016-04-23 NOTE — Discharge Summary (Signed)
Discharge Summary    Patient ID: Jerry Barr,  MRN: [id number], DOB/AGE: [birth year] 75 y.o.  Admit date: 04/20/2016 Discharge date: 04/23/2016  Primary Care Provider: Gara Kroner Primary Cardiologist: Dr Martinique  Discharge Diagnoses    Principal Problem:   New onset atrial fibrillation [facility]) Active Problems:   CAD (coronary artery disease), native coronary artery   Palpitations   Opacity of lung on imaging study   Atrial fibrillation with RVR (HCC)   Chest pain   Allergies Allergies  Allergen Reactions  . Penicillins     States at one time he was allergic but has had it since and had no problem.     Diagnostic Studies/Procedures    ECHO 10/12 - Left ventricle: The cavity size was normal. Wall thickness was   normal. Systolic function was normal. The estimated ejection   fraction was in the range of 50% to 55%. Wall motion was normal;   there were no regional wall motion abnormalities. - Mitral valve: Calcified annulus. There was mild regurgitation. - Left atrium: The atrium was moderately dilated. - Right atrium: The atrium was mildly dilated. - Pericardium, extracardiac: A trivial pericardial effusion was   identified. Impressions: - Low normal LV systolic function; mild MR; biatrial enlargement;   mild TR.     TEE 10/13 - Left ventricle: Systolic function was mildly reduced. The   estimated ejection fraction was in the range of 45% to 50%.   Diffuse hypokinesis. - Aortic valve: No evidence of vegetation. - Mitral valve: No evidence of vegetation. There was mild to   moderate regurgitation. - Left atrium: No evidence of thrombus in the appendage. There was   spontaneous echo contrast (&quot;smoke&quot;). - Right atrium: No evidence of thrombus in the atrial cavity or   appendage. - Atrial septum: No defect or patent foramen ovale was identified.   Echo contrast study showed no right-to-left atrial level shunt,   following an increase in RA  pressure induced by provocative   maneuvers. - Tricuspid valve: No evidence of vegetation. - Pulmonic valve: No evidence of vegetation. - Superior vena cava: The study excluded a thrombus.  10/13 Electrical Cardioversion Procedure Note Jerry Barr [id number] March 15, 1941 Procedure: Electrical Cardioversion Indications:  Atrial Fibrillation Time Out: Verified patient identification, verified procedure,medications/allergies/relevent history reviewed, required imaging and test results available.   Performed Procedure Details The patient was NPO after midnight. Anesthesia was administered at the beside  by Dr. Zenia Resides with propofol.  Cardioversion was performed with synchronized biphasic defibrillation via AP pads with 120, 150 joules.  2 attempt(s) were performed.  The patient converted to normal sinus rhythm. The patient tolerated the procedure well  IMPRESSION: Successful cardioversion of atrial fibrillation. On IV amiodarone.  _____________   History of Present Illness     75 y.o. male with past medical history of CAD (s/p DES to LAD in 2011), HLD, and chronic hypotension who presents to [facility] ED on 04/20/2016 for evaluation of palpitations, fatigue, and chest pain.   Hospital Course     Consultants: None   Pt was in rapid atrial fibrillation. Because of recent travel, a d-dimer was checked. CHA2DS2VASc=2 (age, CAD). He was started on Xarelto at 20 mg qd. He was started on a Cardizem drip for rate control. His BP did not tolerate enough CCB to control his rate, so amiodarone was initiated, IV at first.   Because he was not rate-controlled and was symptomatic, a TEE/DCCV was recommended. This was performed on 10/13. Results  are above. He was successfully converted to SR. He will continue amiodarone at 200 mg bid for a month, then decrease to 200 mg daily. His TSH and LFTs were within normal limits. He may also need PFTs, get as outpatient.   HIs CXR was abnormal, concern for  infection. He was started on Levaquin in the ER, but this was changed to doxycycline because of the amiodarone. He will complete ABX as an outpatient. He needs to follow up with PCP for the abnormal CXR.  He developed chest pain in the setting of rapid atrial fib. His symptoms were felt secondary to demand ischemia. Enzymes were negative and MV last year was ok. His EF was normal by echo. No further evaluation needed. Symptoms resolved once in SR.   After the procedure, he was ambulating without chest pain or SOB. No further inpatient workup was indicated and he is considered stable for discharge, to follow up as an outpatient.  _____________  Discharge Vitals Blood pressure 123/70, pulse 66, temperature 97.9 F (36.6 C), temperature source Oral, resp. rate 16, height 6' (1.829 m), weight 157 lb 6.4 oz (71.4 kg), SpO2 100 %.  Filed Weights   04/20/16 1811 04/23/16 1042  Weight: 157 lb 6.4 oz (71.4 kg) 157 lb 6.4 oz (71.4 kg)    Labs & Radiologic Studies    CBC  Recent Labs  04/21/16 0549  WBC 9.9  HGB 14.6  HCT 43.3  MCV 90.4  PLT [id number]   Basic Metabolic Panel  Recent Labs  04/20/16 1821 04/21/16 0549  NA  --  139  K  --  4.3  CL  --  106  CO2  --  26  GLUCOSE  --  95  BUN  --  16  CREATININE  --  0.99  CALCIUM  --  8.5*  MG 2.3  --    Lab Results  Component Value Date   ALT 12 (L) 04/23/2016   AST 17 04/23/2016   ALKPHOS 45 04/23/2016   BILITOT 0.7 04/23/2016   Cardiac Enzymes  Recent Labs  04/20/16 1821 04/21/16 0012 04/21/16 0549  TROPONINI 0.03* 0.03* 0.03*   Thyroid Function Tests  Recent Labs  04/20/16 1821  TSH 0.571   _____________  Ct Head Wo Contrast Result Date: 04/20/2016 CLINICAL DATA:  Fall after jumped from bed. Initial encounter. EXAM: CT HEAD WITHOUT CONTRAST TECHNIQUE: Contiguous axial images were obtained from the base of the skull through the vertex without intravenous contrast. COMPARISON:  07/04/2012 FINDINGS: Brain: No  evidence of acute infarction, hemorrhage, hydrocephalus, extra-axial collection or mass lesion/mass effect. Vascular: No hyperdense vessel or unexpected calcification. Skull: Negative for fracture Sinuses/Orbits: Negative IMPRESSION: No evidence of intracranial injury. Electronically Signed   By: Monte Fantasia M.D.   On: 04/20/2016 08:29   Dg Chest Port 1 View Result Date: 04/20/2016 CLINICAL DATA:  75 year old male with a history of pain EXAM: PORTABLE CHEST 1 VIEW COMPARISON:  09/30/2015 FINDINGS: Cardiomediastinal silhouette unchanged in size and contour. Calcifications of the aortic arch. Nodular airspace opacity at the medial right base new from the prior. No pleural effusion or pneumothorax. Chronic lung changes, similar to the comparison. No displaced fracture. IMPRESSION: Nodular airspace opacity at the medial right base. This may represent developing infection, and correlation with lab values and presentation may be useful. Recommend follow-up chest x-ray once the patient has been treated for this acute process to assure resolution. Also, referral for pulmonary evaluation and consideration of lung cancer screening CT may  be considered, as low-dose CT lung cancer screening is recommended for patients who are 67-58 years of age with a 30+ pack-year history of smoking, and who are currently smoking or quit <=15 years ago, as this patient may qualify. Aortic atherosclerosis. Signed, Dulcy Fanny. Earleen Newport, DO [facility] Radiology Electronically Signed   By: Corrie Mckusick D.O.   On: 04/20/2016 08:07   Dg Knee Complete 4 Views Left Result Date: 04/20/2016 CLINICAL DATA:  Fall from bed with left anterior knee pain and abrasion. Initial encounter. EXAM: LEFT KNEE - COMPLETE 4+ VIEW COMPARISON:  None. FINDINGS: No evidence of fracture, dislocation, or joint effusion. No opaque foreign body. IMPRESSION: Negative. Electronically Signed   By: Monte Fantasia M.D.    On: 04/20/2016 08:54   Disposition   Pt is being discharged home today in good condition.  Follow-up Plans & Appointments    Follow-up Information    Peter Martinique, MD .   Specialty:  Cardiology Why:  The office will call.  Contact information: [address] [place] [address] [phone]        Gara Kroner, MD .   Specialty:  Family Medicine Why:  Follow up on upper respiratory infection, abnormal chest Xray Contact information: [place] [address] [phone]          Discharge Instructions    Diet - low sodium heart healthy    Complete by:  As directed    Increase activity slowly    Complete by:  As directed       Discharge Medications   Discharge Medication List as of 04/23/2016  4:19 PM    START taking these medications   Details  amiodarone (PACERONE) 200 MG tablet Take 1 tablet (200 mg total) by mouth 2 (two) times daily. Change to 200 mg daily in 1 month., Starting Fri 04/23/2016, Normal    doxycycline (MONODOX) 100 MG capsule Take 1 capsule (100 mg total) by mouth 2 (two) times daily., Starting Fri 04/23/2016, Until Tue 04/27/2016, Normal      CONTINUE these medications which have CHANGED   Details  rivaroxaban (XARELTO) 20 MG TABS tablet Take 1 tablet (20 mg total) by mouth daily with supper., Starting Fri 04/23/2016, Normal      CONTINUE these medications which have NOT CHANGED   Details  aspirin 81 MG tablet Take 81 mg by mouth daily., Historical Med    atorvastatin (LIPITOR) 10 MG tablet TAKE 1 TABLET (10 MG TOTAL) BY MOUTH DAILY., Normal    Cholecalciferol (VITAMIN D-3) 1000 UNITS CAPS Take 1,000 Units by mouth daily., Historical Med    loratadine (CLARITIN) 10 MG tablet Take 10 mg by mouth daily., Historical Med    Multiple Vitamin (MULTIVITAMIN) tablet Take 1 tablet by mouth daily., Historical Med    nitroGLYCERIN (NITROSTAT) 0.4 MG SL tablet Place 1 tablet (0.4 mg total) under the  tongue every 5 (five) minutes as needed for chest pain., Starting Tue 10/29/2014, Normal    Psyllium (METAMUCIL PO) Take 1 scoop by mouth. , Historical Med         Outstanding Labs/Studies   None  Duration of Discharge Encounter   Greater than 30 minutes including physician time.  Jonetta Speak NP 04/23/2016, 4:45 PM

## 2016-04-23 NOTE — Progress Notes (Addendum)
SUBJECTIVE:  No complaints  OBJECTIVE:   Vitals:   Vitals:   04/22/16 0600 04/22/16 1342 04/22/16 1954 04/23/16 0401  BP: 103/65 95/67 (!) 91/59   Pulse: 78 91 92 89  Resp: 18 17 18 18   Temp: 97.8 F (36.6 C) 98.2 F (36.8 C) 98.6 F (37 C) 98.3 F (36.8 C)  TempSrc: Oral Oral Oral Oral  SpO2: 98% 99% 99% 99%  Weight:      Height:       I&O's:    Intake/Output Summary (Last 24 hours) at 04/23/16 0736 Last data filed at 04/23/16 0300  Gross per 24 hour  Intake              480 ml  Output             2651 ml  Net            -2171 ml   TELEMETRY: Reviewed telemetry pt in atrial fibrillation  PHYSICAL EXAM General: Well developed, well nourished, in no acute distress Head: Eyes PERRLA, No xanthomas.   Normal cephalic and atramatic  Lungs:   Clear bilaterally to auscultation and percussion. Heart:   Irregularly irregular S1 S2 Pulses are 2+ & equal. Abdomen: Bowel sounds are positive, abdomen soft and non-tender without masses  Msk:  Back normal, normal gait. Normal strength and tone for age. Extremities:   No clubbing, cyanosis or edema.  DP +1 Neuro: Alert and oriented X 3. Psych:  Good affect, responds appropriately   LABS: Basic Metabolic Panel:  Recent Labs  04/20/16 0745 04/20/16 1821 04/21/16 0549  NA 138  --  139  K 4.3  --  4.3  CL 107  --  106  CO2 25  --  26  GLUCOSE 102*  --  95  BUN 18  --  16  CREATININE 1.06  --  0.99  CALCIUM 8.8*  --  8.5*  MG  --  2.3  --    Liver Function Tests: No results for input(s): AST, ALT, ALKPHOS, BILITOT, PROT, ALBUMIN in the last 72 hours. No results for input(s): LIPASE, AMYLASE in the last 72 hours. CBC:  Recent Labs  04/20/16 0745 04/21/16 0549  WBC 9.6 9.9  NEUTROABS 7.5  --   HGB 14.3 14.6  HCT 43.4 43.3  MCV 92.9 90.4  PLT 301 330   Cardiac Enzymes:  Recent Labs  04/20/16 1821 04/21/16 0012 04/21/16 0549  TROPONINI 0.03* 0.03* 0.03*   BNP: Invalid input(s):  POCBNP D-Dimer:  Recent Labs  04/20/16 0752  DDIMER 0.52*   Hemoglobin A1C: No results for input(s): HGBA1C in the last 72 hours. Fasting Lipid Panel: No results for input(s): CHOL, HDL, LDLCALC, TRIG, CHOLHDL, LDLDIRECT in the last 72 hours. Thyroid Function Tests:  Recent Labs  04/20/16 1821  TSH 0.571   Anemia Panel: No results for input(s): VITAMINB12, FOLATE, FERRITIN, TIBC, IRON, RETICCTPCT in the last 72 hours. Coag Panel:   No results found for: INR, PROTIME  RADIOLOGY: Ct Head Wo Contrast  Result Date: 04/20/2016 CLINICAL DATA:  Fall after jumped from bed. Initial encounter. EXAM: CT HEAD WITHOUT CONTRAST TECHNIQUE: Contiguous axial images were obtained from the base of the skull through the vertex without intravenous contrast. COMPARISON:  07/04/2012 FINDINGS: Brain: No evidence of acute infarction, hemorrhage, hydrocephalus, extra-axial collection or mass lesion/mass effect. Vascular: No hyperdense vessel or unexpected calcification. Skull: Negative for fracture Sinuses/Orbits: Negative IMPRESSION: No evidence of intracranial injury. Electronically Signed   By: Angelica Chessman  Watts M.D.   On: 04/20/2016 08:29   Dg Chest Port 1 View  Result Date: 04/20/2016 CLINICAL DATA:  75 year old male with a history of pain EXAM: PORTABLE CHEST 1 VIEW COMPARISON:  09/30/2015 FINDINGS: Cardiomediastinal silhouette unchanged in size and contour. Calcifications of the aortic arch. Nodular airspace opacity at the medial right base new from the prior. No pleural effusion or pneumothorax. Chronic lung changes, similar to the comparison. No displaced fracture. IMPRESSION: Nodular airspace opacity at the medial right base. This may represent developing infection, and correlation with lab values and presentation may be useful. Recommend follow-up chest x-ray once the patient has been treated for this acute process to assure resolution. Also, referral for pulmonary evaluation and consideration of lung  cancer screening CT may be considered, as low-dose CT lung cancer screening is recommended for patients who are 30-77 years of age with a 30+ pack-year history of smoking, and who are currently smoking or quit <=15 years ago, as this patient may qualify. Aortic atherosclerosis. Signed, Dulcy Fanny. Earleen Newport, DO [facility] Radiology Electronically Signed   By: Corrie Mckusick D.O.   On: 04/20/2016 08:07   Dg Knee Complete 4 Views Left  Result Date: 04/20/2016 CLINICAL DATA:  Fall from bed with left anterior knee pain and abrasion. Initial encounter. EXAM: LEFT KNEE - COMPLETE 4+ VIEW COMPARISON:  None. FINDINGS: No evidence of fracture, dislocation, or joint effusion. No opaque foreign body. IMPRESSION: Negative. Electronically Signed   By: Monte Fantasia M.D.   On: 04/20/2016 08:54    Assessment & Plan    1. New Onset Atrial Fibrillation with RVR - has noted worsening fatigue, lightheadedness, and palpitations for the past two days. Reports occasional episodes in the past but says he thought it was secondary to hypotension.  - D-Dimer minimally elevated at 0.52 (normal 0.5) - unlikely to be PE and most likely elevated in setting of rapid afib.    - Trop minimally elevated at 0.03 with flat trend secondary to afib with RVR.  TSH normal.  - This patients CHA2DS2-VASc Score and unadjusted Ischemic Stroke Rate (% per year) is equal to 2.2 % stroke rate/year from a score of 2 (CAD, Age). Started on Xarelto 20mg  daily on 10/10 . - Rate remained uncontrolled on Cardizem drip which was stopped and started on IV Amio without bolus and HR improved. - Plan for TEE/DCCV today.  I successful then likely discharge home later in day. - Will change to PO Amio 200mg  BID and followup with extender in 1 week for EKG.  Will need outpt PFTs with DLCO for baseline.  TSH ok.  Check LFTs for baseline.     2. Atypical Chest Pain/ CAD - s/p DES to LAD in 2011. NST in 10/2014  with no evidence of ischemia. - patient reports chest discomfort relieved with Gas-X. Developed a centralized chest pressure with elevated HR in afib likely from demand ischemia. Denies any exertional symptoms. Walks for 30-45 minutes daily without symptoms. Recently sprinted in airport without symptoms. - Minimal trop elevation with flat trend due to demand ischemia from afib with RVR.  Nuclear stress test a year ago with no ischemia.  No further ischemic workup at this time. Will have him followup in office.  3. Right Lung Opacity - CXR shows nodular airspace opacity at the medial right base. This may represent developing infection. - WBC count normal. - started on Levaquin while in the ED but changed from Levaquin to doxycycline 100mg   BID x 5 days since he was also on IV amio with concern for QT prolongation.. Needs to follow-up with PCP as an outpatient. - Needs followup Cxray once treatment for URI completed.  4. Chronic Hypotension - Patient reports SBP in the 80's at home and in 90-100's here.   Fransico Him, MD  04/23/2016  7:36 AM

## 2016-04-23 NOTE — Care Management Important Message (Signed)
Important Message  Patient Details  Name: Jerry Barr MRN: [id number] Date of Birth: 1940-10-14   Medicare Important Message Given:  Yes    [name] Abena 04/23/2016, 10:01 AM

## 2016-04-23 NOTE — Anesthesia Postprocedure Evaluation (Signed)
Anesthesia Post Note  Patient: Jerry Barr  Procedure(s) Performed: Procedure(s) (LRB): TRANSESOPHAGEAL ECHOCARDIOGRAM (TEE) (N/A) CARDIOVERSION (N/A)  Patient location during evaluation: PACU Anesthesia Type: MAC Level of consciousness: awake and alert Pain management: pain level controlled Vital Signs Assessment: post-procedure vital signs reviewed and stable Respiratory status: spontaneous breathing, nonlabored ventilation and respiratory function stable Cardiovascular status: stable and blood pressure returned to baseline Anesthetic complications: no    Last Vitals:  Vitals:   04/23/16 1128 04/23/16 1130  BP: (!) 74/49 (!) 86/50  Pulse: 65 67  Resp: 14 17  Temp: 36.7 C     Last Pain:  Vitals:   04/23/16 1128  TempSrc: Oral  PainSc:                  Nilda Simmer

## 2016-04-23 NOTE — Anesthesia Procedure Notes (Signed)
Procedure Name: MAC Date/Time: 04/23/2016 11:05 AM Performed by: Salli Quarry Kila Godina Pre-anesthesia Checklist: Patient identified, Emergency Drugs available, Suction available and Patient being monitored Patient Re-evaluated:Patient Re-evaluated prior to inductionOxygen Delivery Method: Nasal cannula

## 2016-04-23 NOTE — CV Procedure (Signed)
    Electrical Cardioversion Procedure Note Jerry Barr [id number] August 14, 1940  Procedure: Electrical Cardioversion Indications:  Atrial Fibrillation  Time Out: Verified patient identification, verified procedure,medications/allergies/relevent history reviewed, required imaging and test results available.  Performed  Procedure Details  The patient was NPO after midnight. Anesthesia was administered at the beside  by Dr. Zenia Resides with propofol.  Cardioversion was performed with synchronized biphasic defibrillation via AP pads with 120, 150 joules.  2 attempt(s) were performed.  The patient converted to normal sinus rhythm. The patient tolerated the procedure well   IMPRESSION:  Successful cardioversion of atrial fibrillation. On IV amiodarone.    Mark Skains 04/23/2016, 11:28 AM

## 2016-04-26 ENCOUNTER — Encounter (HOSPITAL_COMMUNITY): Payer: Self-pay | Admitting: Cardiology

## 2016-04-30 ENCOUNTER — Encounter: Payer: Self-pay | Admitting: Physician Assistant

## 2016-04-30 ENCOUNTER — Ambulatory Visit (INDEPENDENT_AMBULATORY_CARE_PROVIDER_SITE_OTHER): Payer: Medicare Other | Admitting: Physician Assistant

## 2016-04-30 VITALS — BP 90/60 | HR 66 | Ht 72.0 in | Wt 165.0 lb

## 2016-04-30 DIAGNOSIS — T462X5A Adverse effect of other antidysrhythmic drugs, initial encounter: Secondary | ICD-10-CM

## 2016-04-30 DIAGNOSIS — I700 Atherosclerosis of aorta: Secondary | ICD-10-CM | POA: Diagnosis not present

## 2016-04-30 DIAGNOSIS — I480 Paroxysmal atrial fibrillation: Secondary | ICD-10-CM | POA: Diagnosis not present

## 2016-04-30 DIAGNOSIS — I959 Hypotension, unspecified: Secondary | ICD-10-CM

## 2016-04-30 DIAGNOSIS — E785 Hyperlipidemia, unspecified: Secondary | ICD-10-CM

## 2016-04-30 DIAGNOSIS — I2510 Atherosclerotic heart disease of native coronary artery without angina pectoris: Secondary | ICD-10-CM | POA: Diagnosis not present

## 2016-04-30 DIAGNOSIS — J181 Lobar pneumonia, unspecified organism: Secondary | ICD-10-CM | POA: Diagnosis not present

## 2016-04-30 DIAGNOSIS — I4891 Unspecified atrial fibrillation: Secondary | ICD-10-CM | POA: Diagnosis not present

## 2016-04-30 NOTE — Patient Instructions (Signed)
Medication Instructions:  DECREASE- Amiodarone 200 mg daily in 3 weeks  Labwork: Fasting Lipids in 2 months  Testing/Procedures: Your physician has recommended that you have a pulmonary function test. Pulmonary Function Tests are a group of tests that measure how well air moves in and out of your lungs.  Follow-Up: Your physician recommends that you schedule a follow-up appointment in: 2-3 Months with Dr Martinique   Any Other Special Instructions Will Be Listed Below (If Applicable).   If you need a refill on your cardiac medications before your next appointment, please call your pharmacy.

## 2016-04-30 NOTE — Progress Notes (Signed)
Cardiology Office Note    Date:  05/01/2016   ID:  Jaron Monsees, DOB 1941/07/02, MRN [id number]  PCP:  Gara Kroner, MD  Cardiologist:  Dr. Martinique  Chief Complaint  Patient presents with  . Hospitalization Follow-up    seen for Dr. Martinique    History of Present Illness:  Taytum Lail is a 75 y.o. male with PMH of BPH, HLD, chronic hypotension, and CAD s/p DES to LAD in 2011. Patient was admitted to [facility] on 04/20/2016 for evaluation of palpitation, fatigue and chest pain and found to have new atrial fibrillation. Prior to this admission, he did have a nuclear stress test in April 2016 that showed no evidence of ischemia. He recently returned from a trip to [place] with his son several weeks ago, he has been feeling under the weather since. He denies any recent exertional chest pain or dyspnea on exertion. Initial EKG shows atrial fibrillation with heart rate of 96 on ED visit., His heart rate was varying from 120s to 160s during the encounter. Chest x-ray showed a nodular airspace opacity in the medial right base. He was started on Levaquin while in the ED. Laboratory finding shows TSH normal, d-dimer 0.52. Troponin was minimally elevated. However given that the stress test 1 year ago, no further ischemic workup was felt to be necessary. There was difficulty controlling his heart rate on Cardizem drip. He was placed on Xarelto, with plan of TEE DCCV if he does not convert after 3 days. IV amiodarone was started, his Levaquin was switched to doxycycline 100 mg twice a day for 5 days. Echocardiogram obtained on 04/22/2016 showed EF 50-55%, no regional wall motion abnormality, mild MR. He eventually underwent successful TEE cardioversion on 04/23/2016 and was discharged on the same day.  He presents today for hospital follow-up, he will need outpatient PFT given initiation of amiodarone, he will also need annual eye exam, baseline liver function test obtained on 04/23/2016 has  been reviewed and was normal. He has been doing well since his discharge. He denies any further chest discomfort or shortness of breath. He has full cardiac awareness of atrial fibrillation. He is still on aspirin. Recent lab work has been reviewed, he has normal kidney function and no sign of anemia. We will arrange outpatient pulmonary function test. We have been over the need to decrease amiodarone a month after starting it, he will decrease amiodarone to 200 mg daily in 3 weeks. I will arrange 2-3 month follow-up. I will also discuss with Dr. Martinique to potentially stop his aspirin considering he is on Xarelto at the same time.    Past Medical History:  Diagnosis Date  . BPH (benign prostatic hyperplasia)   . Coronary artery disease    Promus drug-eluting stent to a 99% mid LAD, 40-50% narrowing in the distal LAD with mild to moderate disease in the circ and RCA  . Dyslipidemia   . ED (erectile dysfunction)   . Hyperplastic colon polyp   . Hypoglycemia     Past Surgical History:  Procedure Laterality Date  . CARDIOVERSION N/A 04/23/2016   Procedure: CARDIOVERSION;  Surgeon: [name], MD;  Location: [facility];  Service: Cardiovascular;  Laterality: N/A;  . CORONARY ANGIOPLASTY WITH STENT PLACEMENT    . TEE WITHOUT CARDIOVERSION N/A 04/23/2016   Procedure: TRANSESOPHAGEAL ECHOCARDIOGRAM (TEE);  Surgeon: [name], MD;  Location: [facility] ENDOSCOPY;  Service: Cardiovascular;  Laterality: N/A;    Current Medications: Outpatient Medications Prior to Visit  Medication Sig Dispense Refill  . amiodarone (PACERONE) 200 MG tablet Take 1 tablet (200 mg total) by mouth 2 (two) times daily. Change to 200 mg daily in 1 month. 60 tablet 11  . aspirin 81 MG tablet Take 81 mg by mouth daily.    Marland Kitchen atorvastatin (LIPITOR) 10 MG tablet TAKE 1 TABLET (10 MG TOTAL) BY MOUTH DAILY. 90 tablet 3  . Cholecalciferol (VITAMIN D-3) 1000 UNITS CAPS Take 1,000 Units by mouth daily.    Marland Kitchen loratadine (CLARITIN)  10 MG tablet Take 10 mg by mouth daily.    . Multiple Vitamin (MULTIVITAMIN) tablet Take 1 tablet by mouth daily.    . nitroGLYCERIN (NITROSTAT) 0.4 MG SL tablet Place 1 tablet (0.4 mg total) under the tongue every 5 (five) minutes as needed for chest pain. 30 tablet 3  . Psyllium (METAMUCIL PO) Take 1 scoop by mouth.     . rivaroxaban (XARELTO) 20 MG TABS tablet Take 1 tablet (20 mg total) by mouth daily with supper. 30 tablet 11   No facility-administered medications prior to visit.      Allergies:   Penicillins   Social History   Social History  . Marital status: Married    Spouse name: N/A  . Number of children: N/A  . Years of education: N/A   Social History Main Topics  . Smoking status: Never Smoker  . Smokeless tobacco: Never Used  . Alcohol use None  . Drug use: No  . Sexual activity: Not Asked   Other Topics Concern  . None   Social History Narrative  . None     Family History:  The patient's family history includes CAD in his father; Heart attack in his paternal grandmother.   ROS:   Please see the history of present illness.    ROS All other systems reviewed and are negative.   PHYSICAL EXAM:   VS:  BP 90/60   Pulse 66   Ht 6' (1.829 m)   Wt 165 lb (74.8 kg)   BMI 22.38 kg/m    GEN: Well nourished, well developed, in no acute distress  HEENT: normal  Neck: no JVD, carotid bruits, or masses Cardiac: RRR; no murmurs, rubs, or gallops,no edema  Respiratory:  clear to auscultation bilaterally, normal work of breathing GI: soft, nontender, nondistended, + BS MS: no deformity or atrophy  Skin: warm and dry, no rash Neuro:  Alert and Oriented x 3, Strength and sensation are intact Psych: euthymic mood, full affect  Wt Readings from Last 3 Encounters:  04/30/16 165 lb (74.8 kg)  04/23/16 157 lb 6.4 oz (71.4 kg)  12/12/15 161 lb (73 kg)      Studies/Labs Reviewed:   EKG:  EKG is ordered today.  The ekg ordered today demonstrates Normal sinus  rhythm without significant ST. Changes.  Recent Labs: 04/20/2016: Magnesium 2.3; TSH 0.571 04/21/2016: BUN 16; Creatinine, Ser 0.99; Hemoglobin 14.6; Platelets 330; Potassium 4.3; Sodium 139 04/23/2016: ALT 12   Lipid Panel No results found for: CHOL, TRIG, HDL, CHOLHDL, VLDL, LDLCALC, LDLDIRECT  Additional studies/ records that were reviewed today include:   Echo 04/22/2016 LV EF: 50% -   55%  - Left ventricle: The cavity size was normal. Wall thickness was   normal. Systolic function was normal. The estimated ejection   fraction was in the range of 50% to 55%. Wall motion was normal;   there were no regional wall motion abnormalities. - Mitral valve: Calcified annulus. There was mild regurgitation. -  Left atrium: The atrium was moderately dilated. - Right atrium: The atrium was mildly dilated. - Pericardium, extracardiac: A trivial pericardial effusion was   identified.  Impressions:  - Low normal LV systolic function; mild MR; biatrial enlargement;   mild TR       TEE 04/23/2016 - Left ventricle: Systolic function was mildly reduced. The estimated ejection fraction was in the range of 45% to 50%. Diffuse hypokinesis. - Aortic valve: No evidence of vegetation. - Mitral valve: No evidence of vegetation. There was mild to moderate regurgitation. - Left atrium: No evidence of thrombus in the appendage. There was spontaneous echo contrast (&quot;smoke&quot;). - Right atrium: No evidence of thrombus in the atrial cavity or appendage. - Atrial septum: No defect or patent foramen ovale was identified. Echo contrast study showed no right-to-left atrial level shunt, following an increase in RA pressure induced by provocative maneuvers. - Tricuspid valve: No evidence of vegetation. - Pulmonic valve: No evidence of vegetation. - Superior vena cava: The study excluded a thrombus.    04/23/2016 Electrical Cardioversion Procedure Note Procedure:  Electrical Cardioversion Indications:Atrial Fibrillation Performed Procedure Details The patient was NPO after midnight. Anesthesia was administered at the beside by Dr. Antony Contras propofol. Cardioversion was performed with synchronized biphasic defibrillation via AP pads with 120, 150joules. 2attempt(s) were performed. The patient converted to normal sinus rhythm. The patient tolerated the procedure well  IMPRESSION: Successful cardioversion of atrial fibrillation. On IV amiodarone.  ASSESSMENT:    1. PAF (paroxysmal atrial fibrillation) ([place])   2. Dyslipidemia   3. Adverse effect of amiodarone, initial encounter   4. Hypotension, unspecified hypotension type   5. Coronary artery disease involving native coronary artery of native heart without angina pectoris      PLAN:  In order of problems listed above:  1. PAF s/p TEE/DCCV 04/23/2016  - On able to add any beta blocker and calcium channel blocker due to baseline hypotension. Initiated on IV amiodarone prior to TEE DCCV, currently on 200 mg twice a day of amiodarone, has completed one week so far, will finish one month of amiodarone and then transition to 200 mg daily.  - Normal TSH and LFT in the hospital. Advised the patient to have annual eye exam and also arrange outpatient PFTs to establish baseline after starting amiodarone  - This patients CHA2DS2-VASc Score and unadjusted Ischemic Stroke Rate (% per year) is equal to 2.2 % stroke rate/year from a score of 2  Above score calculated as 1 point each if present [CHF, HTN, DM, Vascular=MI/PAD/Aortic Plaque, Age if 65-74, or Male] Above score calculated as 2 points each if present [Age > 75, or Stroke/TIA/TE]  2. CAD s/p DES to LAD in 2011: No obvious angina.  3. HLD: He has not had a fasting lipid panel in a while, will obtain fasting lipid panel on next visit.  4. Hypotension: Long-standing history of hypertension, today's blood pressure is  90/60.    Medication Adjustments/Labs and Tests Ordered: Current medicines are reviewed at length with the patient today.  Concerns regarding medicines are outlined above.  Medication changes, Labs and Tests ordered today are listed in the Patient Instructions below. Patient Instructions  Medication Instructions:  DECREASE- Amiodarone 200 mg daily in 3 weeks  Labwork: Fasting Lipids in 2 months  Testing/Procedures: Your physician has recommended that you have a pulmonary function test. Pulmonary Function Tests are a group of tests that measure how well air moves in and out of your lungs.  Follow-Up: Your physician recommends  that you schedule a follow-up appointment in: 2-3 Months with Dr Martinique   Any Other Special Instructions Will Be Listed Below (If Applicable).   If you need a refill on your cardiac medications before your next appointment, please call your pharmacy.      Hilbert Corrigan, [place]  05/01/2016 12:39 AM    [address], [place], [place]  [address] Phone: [phone]; Fax: [phone]

## 2016-05-01 ENCOUNTER — Encounter: Payer: Self-pay | Admitting: Physician Assistant

## 2016-05-03 ENCOUNTER — Telehealth: Payer: Self-pay | Admitting: Physician Assistant

## 2016-05-03 NOTE — Progress Notes (Signed)
Please inform patient I have discussed with Dr. Martinique who agreed we should stop his Aspirin since he is currently on Xarelto.

## 2016-05-03 NOTE — Telephone Encounter (Signed)
Called pt, LM to stop aspirin and to call with any questions.--OK per DPR.

## 2016-05-03 NOTE — Telephone Encounter (Signed)
-----   Message from [place], [place] sent at 05/03/2016  2:13 PM EDT -----   ----- Message ----- From: Peter M Martinique, MD Sent: 05/01/2016   9:40 AM To: Almyra Deforest, PA  Yes I would stop ASA  Peter Martinique MD, [facility]  ----- Message ----- From: Almyra Deforest, PA Sent: 05/01/2016   1:10 AM To: Peter M Martinique, MD  Do you think we can stop aspirin on this patient who had DES to LAD in 2011, but recently admitted for PAF underwent TEE DCCV and on Xarelto?

## 2016-05-10 ENCOUNTER — Ambulatory Visit (HOSPITAL_COMMUNITY)
Admission: RE | Admit: 2016-05-10 | Discharge: 2016-05-10 | Disposition: A | Discharge status: Home / Self Care | Payer: Medicare Other | Source: Ambulatory Visit | Attending: Physician Assistant | Admitting: Physician Assistant

## 2016-05-10 DIAGNOSIS — Z79899 Other long term (current) drug therapy: Secondary | ICD-10-CM | POA: Insufficient documentation

## 2016-05-10 DIAGNOSIS — I4891 Unspecified atrial fibrillation: Secondary | ICD-10-CM | POA: Diagnosis not present

## 2016-05-10 DIAGNOSIS — T462X5A Adverse effect of other antidysrhythmic drugs, initial encounter: Secondary | ICD-10-CM

## 2016-05-10 LAB — PULMONARY FUNCTION TEST
DL/VA % PRED: 91 %
DL/VA: 4.27 ml/min/mmHg/L
DLCO UNC % PRED: 79 %
DLCO unc: 26.82 ml/min/mmHg
FEF 25-75 POST: 1.57 L/s
FEF 25-75 PRE: 2.5 L/s
FEF2575-%Change-Post: -37 %
FEF2575-%PRED-PRE: 107 %
FEF2575-%Pred-Post: 67 %
FEV1-%Change-Post: -6 %
FEV1-%Pred-Post: 101 %
FEV1-%Pred-Pre: 108 %
FEV1-PRE: 3.48 L
FEV1-Post: 3.24 L
FEV1FVC-%CHANGE-POST: 2 %
FEV1FVC-%PRED-PRE: 100 %
FEV6-%Change-Post: -8 %
FEV6-%Pred-Post: 102 %
FEV6-%Pred-Pre: 111 %
FEV6-POST: 4.23 L
FEV6-Pre: 4.63 L
FEV6FVC-%Change-Post: 0 %
FEV6FVC-%PRED-POST: 104 %
FEV6FVC-%Pred-Pre: 103 %
FVC-%Change-Post: -8 %
FVC-%PRED-PRE: 107 %
FVC-%Pred-Post: 98 %
FVC-POST: 4.33 L
FVC-PRE: 4.74 L
PRE FEV1/FVC RATIO: 73 %
Post FEV1/FVC ratio: 75 %
Post FEV6/FVC ratio: 98 %
Pre FEV6/FVC Ratio: 98 %
RV % pred: 71 %
RV: 1.87 L
TLC % PRED: 86 %
TLC: 6.27 L

## 2016-05-10 MED ORDER — ALBUTEROL SULFATE (2.5 MG/3ML) 0.083% IN NEBU
2.5000 mg | INHALATION_SOLUTION | Freq: Once | RESPIRATORY_TRACT | Status: AC
  Administered 2016-05-10: 2.5 mg via RESPIRATORY_TRACT

## 2016-05-17 ENCOUNTER — Other Ambulatory Visit: Payer: Self-pay | Admitting: Internal Medicine

## 2016-05-24 ENCOUNTER — Ambulatory Visit
Admission: RE | Admit: 2016-05-24 | Discharge: 2016-05-24 | Disposition: A | Discharge status: Home / Self Care | Payer: Medicare Other | Source: Ambulatory Visit | Attending: Family Medicine | Admitting: Family Medicine

## 2016-05-24 ENCOUNTER — Other Ambulatory Visit: Payer: Self-pay | Admitting: Family Medicine

## 2016-05-24 DIAGNOSIS — J189 Pneumonia, unspecified organism: Secondary | ICD-10-CM

## 2016-05-24 DIAGNOSIS — J181 Lobar pneumonia, unspecified organism: Principal | ICD-10-CM

## 2016-06-27 NOTE — Progress Notes (Signed)
' Patient ID: Jerry Barr, male   DOB: 1941-02-21, 75 y.o.   MRN: [id number]  PCP: Antony Contras MD Cardiologist: Jovanka Westgate Martinique MD  HPI:   Jerry Barr is a very pleasant 75 y/o male seen for follow up  CAD, HL, Afib, and hypoglycemia. He was originally referred by Dr. Carleene Cooper at [facility] in [facility] to establish care once he moved to this area. He previously worked for [employer] for 30 years. 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   He has h/o CAD which manifested as indigestion and underwent PCI of LAD in 2011 with Promus 2.75x88mm DES. Otherwise has mild to moderate CAD. Myoview April  2016 was normal.   In October 2017 he was admitted with new onset Afib. he states he felt heart racing and irregular. Felt extremely weak. Rate was controlled and he was asymptomatic. He was started on Xarelto and amiodarone. He underwent TEE guided DCCV. Echocardiogram obtained on 04/22/2016 showed EF 50-55%, no regional wall motion abnormality, mild MR. He eventually underwent successful TEE cardioversion on 04/23/2016 and was discharged on the same day. Subsequent PFTs were satisfactory.   He has had a h/o of hypoglycemia and has been very diligent about managing his diet and eating healthy. He also has a history of chronic hypotension but really denies dizziness or syncope.  Does exercise regularly. Denies CP or indigestion.   ROS: as noted in HPI. All other systems are reviewed and are negative.   Past Medical History:  Diagnosis Date  . BPH (benign prostatic hyperplasia)   . Coronary artery disease    Promus drug-eluting stent to a 99% mid LAD, 40-50% narrowing in the distal LAD with mild to moderate disease in the circ and RCA  . Dyslipidemia   . ED (erectile dysfunction)   . Hyperplastic colon polyp   . Hypoglycemia     Current Outpatient Prescriptions  Medication Sig Dispense Refill  . atorvastatin (LIPITOR) 10 MG tablet TAKE 1  TABLET (10 MG TOTAL) BY MOUTH DAILY. 90 tablet 3  . Cholecalciferol (VITAMIN D-3) 1000 UNITS CAPS Take 1,000 Units by mouth daily.    . fluticasone (FLONASE) 50 MCG/ACT nasal spray Place 1 spray into both nostrils as needed for allergies.     Marland Kitchen loratadine (CLARITIN) 10 MG tablet Take 10 mg by mouth as needed for allergies.     . Multiple Vitamin (MULTIVITAMIN) tablet Take 1 tablet by mouth daily.    . nitroGLYCERIN (NITROSTAT) 0.4 MG SL tablet Place 1 tablet (0.4 mg total) under the tongue every 5 (five) minutes as needed for chest pain. 30 tablet 3  . Psyllium (METAMUCIL PO) Take 1 scoop by mouth.     Alveda Reasons 20 MG TABS tablet TAKE 1 TABLET (20 MG TOTAL) BY MOUTH DAILY WITH SUPPER. 30 tablet 0  . amiodarone (PACERONE) 200 MG tablet Take 1 tablet (200 mg total) by mouth daily. 90 tablet 3   No current facility-administered medications for this visit.     Allergies  Allergen Reactions  . Penicillins Other (See Comments)    States at one time he was allergic but has had it since and had no problem.       Social History   Social History  . Marital status: Married    Spouse name: N/A  . Number of children: N/A  . Years of education: N/A   Occupational History  . Not on file.  Social History Main Topics  . Smoking status: Never Smoker  . Smokeless tobacco: Never Used  . Alcohol use Not on file  . Drug use: No  . Sexual activity: Not on file   Other Topics Concern  . Not on file   Social History Narrative  . No narrative on file      Family History  Problem Relation Age of Onset  . CAD Father   . Heart attack Paternal Grandmother     Vitals:   06/29/16 0933  BP: 96/62  Pulse: 63  SpO2: 99%  Weight: 158 lb 12.8 oz (72 kg)  Height: 6' (1.829 m)    PHYSICAL EXAM: General:  Well appearing. No respiratory difficulty HEENT: normal Neck: supple. no JVD. Carotids 2+ bilat; no bruits. No lymphadenopathy or thryomegaly appreciated. Cor: PMI nondisplaced. Regular  rate & rhythm. No rubs, gallops or murmurs. Lungs: clear Abdomen: soft, nontender, nondistended. No hepatosplenomegaly. No bruits or masses. Good bowel sounds. Extremities: no cyanosis, clubbing, rash, edema Neuro: alert & oriented x 3, cranial nerves grossly intact. moves all 4 extremities w/o difficulty. Affect pleasant.  Laboratory data:  Lab Results  Component Value Date   WBC 9.9 04/21/2016   HGB 14.6 04/21/2016   HCT 43.3 04/21/2016   PLT 330 04/21/2016   GLUCOSE 95 04/21/2016   ALT 12 (L) 04/23/2016   AST 17 04/23/2016   NA 139 04/21/2016   K 4.3 04/21/2016   CL 106 04/21/2016   CREATININE 0.99 04/21/2016   BUN 16 04/21/2016   CO2 26 04/21/2016   TSH 0.571 04/20/2016   Lipid Panel  No results found for: CHOL, TRIG, HDL, CHOLHDL, VLDL, LDLCALC, LDLDIRECT  Last lipid panel 04/04/15: cholesterol 134, triglycerides 38, LDL 75, HDL 52.  ECG: NSR 84. Normal. I have personally reviewed and interpreted this study.  Echo 04/22/2016 LV EF: 50% - 55%  - Left ventricle: The cavity size was normal. Wall thickness was normal. Systolic function was normal. The estimated ejection fraction was in the range of 50% to 55%. Wall motion was normal; there were no regional wall motion abnormalities. - Mitral valve: Calcified annulus. There was mild regurgitation. - Left atrium: The atrium was moderately dilated. - Right atrium: The atrium was mildly dilated. - Pericardium, extracardiac: A trivial pericardial effusion was identified.  Impressions:  - Low normal LV systolic function; mild MR; biatrial enlargement; mild TR    TEE 04/23/2016 - Left ventricle: Systolic function was mildly reduced. The estimated ejection fraction was in the range of 45% to 50%. Diffuse hypokinesis. - Aortic valve: No evidence of vegetation. - Mitral valve: No evidence of vegetation. There was mild to moderate regurgitation. - Left atrium: No evidence of thrombus in the  appendage. There was spontaneous echo contrast (&quot;smoke&quot;). - Right atrium: No evidence of thrombus in the atrial cavity or appendage. - Atrial septum: No defect or patent foramen ovale was identified. Echo contrast study showed no right-to-left atrial level shunt, following an increase in RA pressure induced by provocative maneuvers. - Tricuspid valve: No evidence of vegetation. - Pulmonic valve: No evidence of vegetation. - Superior vena cava: The study excluded a thrombus.    04/23/2016 Electrical Cardioversion Procedure Note Procedure: Electrical Cardioversion Indications:Atrial Fibrillation Performed Procedure Details The patient was NPO after midnight. Anesthesia was administered at the beside by Dr. Antony Contras propofol. Cardioversion was performed with synchronized biphasic defibrillation via AP pads with 120, 150joules. 2attempt(s) were performed. The patient converted to normal sinus rhythm. The patient tolerated the  procedure well  IMPRESSION: Successful cardioversion of atrial fibrillation. On IV amiodarone.   ASSESSMENT & PLAN: 1. CAD    --s/p Promus DES to 99% mLAD stenosis 2011 (in [place]). Moderate nonobstructive CAD otherwise    --Myoview April 2016 was normal.    - he is asymptomatic. Continue  statin. Not a candidate for beta blocker due to chronic hypotension. Off ASA since now on Xarelto. 2. Atrial fibrillation s/p DCCV. Maintaining NSR on amiodarone. Will continue 200 mg daily. Check LFTs and TSH today. Continue Xarelto. 3. History of hypoglycemia  4. Chronic hypotension. 5. Hyperlipidemia.   I will follow up in 4 months.  Vernona Peake [facility] 9:58 AM

## 2016-06-29 ENCOUNTER — Encounter: Payer: Self-pay | Admitting: Cardiology

## 2016-06-29 ENCOUNTER — Ambulatory Visit (INDEPENDENT_AMBULATORY_CARE_PROVIDER_SITE_OTHER): Payer: Medicare Other | Admitting: Cardiology

## 2016-06-29 VITALS — BP 96/62 | HR 63 | Ht 72.0 in | Wt 158.8 lb

## 2016-06-29 DIAGNOSIS — R002 Palpitations: Secondary | ICD-10-CM | POA: Diagnosis not present

## 2016-06-29 DIAGNOSIS — I2510 Atherosclerotic heart disease of native coronary artery without angina pectoris: Secondary | ICD-10-CM | POA: Diagnosis not present

## 2016-06-29 DIAGNOSIS — I4891 Unspecified atrial fibrillation: Secondary | ICD-10-CM | POA: Diagnosis not present

## 2016-06-29 DIAGNOSIS — E785 Hyperlipidemia, unspecified: Secondary | ICD-10-CM | POA: Diagnosis not present

## 2016-06-29 LAB — TSH: TSH: 1.59 mIU/L (ref 0.40–4.50)

## 2016-06-29 NOTE — Patient Instructions (Signed)
We will check lab work today  Continue your current therapy  I will see you in 4 months.

## 2016-06-30 LAB — LIPID PANEL
Cholesterol: 131 mg/dL (ref <200)
HDL: 43 mg/dL (ref >40)
LDL CALC: 80 mg/dL (ref <100)
TRIGLYCERIDES: 42 mg/dL (ref <150)
Total CHOL/HDL Ratio: 3 Ratio (ref <5.0)
VLDL: 8 mg/dL (ref <30)

## 2016-06-30 LAB — COMPREHENSIVE METABOLIC PANEL
ALBUMIN: 4 g/dL (ref 3.6–5.1)
ALT: 17 U/L (ref 9–46)
AST: 17 U/L (ref 10–35)
Alkaline Phosphatase: 44 U/L (ref 40–115)
BILIRUBIN TOTAL: 0.7 mg/dL (ref 0.2–1.2)
BUN: 22 mg/dL (ref 7–25)
CO2: 27 mmol/L (ref 20–31)
CREATININE: 1.13 mg/dL (ref 0.70–1.18)
Calcium: 8.8 mg/dL (ref 8.6–10.3)
Chloride: 106 mmol/L (ref 98–110)
Glucose, Bld: 84 mg/dL (ref 65–99)
Potassium: 4.7 mmol/L (ref 3.5–5.3)
SODIUM: 141 mmol/L (ref 135–146)
TOTAL PROTEIN: 6.6 g/dL (ref 6.1–8.1)

## 2016-07-15 DIAGNOSIS — H52203 Unspecified astigmatism, bilateral: Secondary | ICD-10-CM | POA: Diagnosis not present

## 2016-07-15 DIAGNOSIS — H2513 Age-related nuclear cataract, bilateral: Secondary | ICD-10-CM | POA: Diagnosis not present

## 2016-08-16 DIAGNOSIS — N401 Enlarged prostate with lower urinary tract symptoms: Secondary | ICD-10-CM | POA: Diagnosis not present

## 2016-08-16 DIAGNOSIS — R3912 Poor urinary stream: Secondary | ICD-10-CM | POA: Diagnosis not present

## 2016-08-16 DIAGNOSIS — R39198 Other difficulties with micturition: Secondary | ICD-10-CM | POA: Diagnosis not present

## 2016-08-23 ENCOUNTER — Telehealth: Payer: Self-pay | Admitting: Cardiology

## 2016-08-23 NOTE — Telephone Encounter (Signed)
This medication should not significantly affect his blood pressure.  It is anti-androgenic, and does not play a direct role in heart tissue like some of the older prostate medications.

## 2016-08-23 NOTE — Telephone Encounter (Signed)
Pt advised on pharmD recommendations. Aware to call if he has further concerns regarding medication regimen.

## 2016-08-23 NOTE — Telephone Encounter (Signed)
Pt was recently prescribed dutasteride for his BPH. Wants to make sure this will be OK to take with his current medications. He was especially concerned since his BP already runs a bit low and wanted to make sure this wouldn't further drop it or come into conflict w his cardiac meds. States "I can live without it" if he's advised not to take the dutasteride.  Unfortunately he does not know the dose he's prescribed.  Aware I will seek review on this medication and follow up w/ him.

## 2016-09-14 DIAGNOSIS — L820 Inflamed seborrheic keratosis: Secondary | ICD-10-CM | POA: Diagnosis not present

## 2016-09-14 DIAGNOSIS — D492 Neoplasm of unspecified behavior of bone, soft tissue, and skin: Secondary | ICD-10-CM | POA: Diagnosis not present

## 2016-09-14 DIAGNOSIS — L570 Actinic keratosis: Secondary | ICD-10-CM | POA: Diagnosis not present

## 2016-10-11 DIAGNOSIS — Z1211 Encounter for screening for malignant neoplasm of colon: Secondary | ICD-10-CM | POA: Diagnosis not present

## 2016-10-11 DIAGNOSIS — Z0000 Encounter for general adult medical examination without abnormal findings: Secondary | ICD-10-CM | POA: Diagnosis not present

## 2016-10-11 DIAGNOSIS — I700 Atherosclerosis of aorta: Secondary | ICD-10-CM | POA: Diagnosis not present

## 2016-10-11 DIAGNOSIS — J309 Allergic rhinitis, unspecified: Secondary | ICD-10-CM | POA: Diagnosis not present

## 2016-10-11 DIAGNOSIS — Z1389 Encounter for screening for other disorder: Secondary | ICD-10-CM | POA: Diagnosis not present

## 2016-10-11 DIAGNOSIS — H9190 Unspecified hearing loss, unspecified ear: Secondary | ICD-10-CM | POA: Diagnosis not present

## 2016-10-11 DIAGNOSIS — I4891 Unspecified atrial fibrillation: Secondary | ICD-10-CM | POA: Diagnosis not present

## 2016-10-11 DIAGNOSIS — I2510 Atherosclerotic heart disease of native coronary artery without angina pectoris: Secondary | ICD-10-CM | POA: Diagnosis not present

## 2016-10-11 DIAGNOSIS — E7800 Pure hypercholesterolemia, unspecified: Secondary | ICD-10-CM | POA: Diagnosis not present

## 2016-10-11 DIAGNOSIS — Z125 Encounter for screening for malignant neoplasm of prostate: Secondary | ICD-10-CM | POA: Diagnosis not present

## 2016-10-11 DIAGNOSIS — Z23 Encounter for immunization: Secondary | ICD-10-CM | POA: Diagnosis not present

## 2016-10-13 ENCOUNTER — Telehealth: Payer: Self-pay | Admitting: Gastroenterology

## 2016-11-01 DIAGNOSIS — H918X9 Other specified hearing loss, unspecified ear: Secondary | ICD-10-CM | POA: Diagnosis not present

## 2016-11-06 NOTE — Progress Notes (Signed)
' Patient ID: Jerry Barr, male   DOB: [birth year], 76 y.o.   MRN: [id number]  PCP: Antony Contras MD Cardiologist: Peter Martinique MD  HPI:   Jerry Barr is a very pleasant 76 y/o male seen for follow up  CAD, HL, Afib, and hypoglycemia. He was originally referred by Dr. Carleene Cooper at [facility] in [facility] to establish care once he moved to this area. He previously worked for [employer] for 30 years. 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   He has h/o CAD which manifested as indigestion and underwent PCI of LAD in 2011 with Promus 2.75x65mm DES. Otherwise has mild to moderate CAD. Myoview April  2016 was normal.   In October 2017 he was admitted with new onset Afib. he states he felt heart racing and irregular. Felt extremely weak. Rate was controlled and he was asymptomatic. He was started on Xarelto and amiodarone. He underwent TEE guided DCCV. Echocardiogram obtained on 04/22/2016 showed EF 50-55%, no regional wall motion abnormality, mild MR. He eventually underwent successful TEE cardioversion on 04/23/2016. Subsequent PFTs were satisfactory.   He has had a h/o of hypoglycemia and has been very diligent about managing his diet and eating healthy. He also has a history of chronic hypotension. He did note one episode of lightheadedness 2 weeks ago after getting out of a car. Lasted 5 minutes and went away.  Does exercise regularly - a brisk walk 5-6 days/week for 45 minutes. Denies CP or indigestion. Planning on a [place] this summer.    ROS: as noted in HPI. All other systems are reviewed and are negative.   Past Medical History:  Diagnosis Date  . BPH (benign prostatic hyperplasia)   . Coronary artery disease    Promus drug-eluting stent to a 99% mid LAD, 40-50% narrowing in the distal LAD with mild to moderate disease in the circ and RCA  . Dyslipidemia   . ED (erectile dysfunction)   . Hyperplastic colon  polyp   . Hypoglycemia     Current Outpatient Prescriptions  Medication Sig Dispense Refill  . amiodarone (PACERONE) 200 MG tablet Take 100 mg by mouth daily. 90 tablet 3  . atorvastatin (LIPITOR) 10 MG tablet TAKE 1 TABLET (10 MG TOTAL) BY MOUTH DAILY. 90 tablet 3  . Cholecalciferol (VITAMIN D-3) 1000 UNITS CAPS Take 1,000 Units by mouth daily.    . fluticasone (FLONASE) 50 MCG/ACT nasal spray Place 1 spray into both nostrils as needed for allergies.     Marland Kitchen loratadine (CLARITIN) 10 MG tablet Take 10 mg by mouth as needed for allergies.     . Multiple Vitamin (MULTIVITAMIN) tablet Take 1 tablet by mouth daily.    . Psyllium (METAMUCIL PO) Take 1 scoop by mouth.     Alveda Reasons 20 MG TABS tablet TAKE 1 TABLET (20 MG TOTAL) BY MOUTH DAILY WITH SUPPER. 30 tablet 0   No current facility-administered medications for this visit.     Allergies  Allergen Reactions  . Penicillins Other (See Comments)    States at one time he was allergic but has had it since and had no problem.       Social History   Social History  . Marital status: Married    Spouse name: N/A  . Number of children: N/A  . Years of education: N/A   Occupational History  . Not on file.   Social  History Main Topics  . Smoking status: Never Smoker  . Smokeless tobacco: Never Used  . Alcohol use Not on file  . Drug use: No  . Sexual activity: Not on file   Other Topics Concern  . Not on file   Social History Narrative  . No narrative on file      Family History  Problem Relation Age of Onset  . CAD Father   . Heart attack Paternal Grandmother     Vitals:   11/08/16 0905  BP: 100/60  Pulse: 70  Weight: 159 lb 4 oz (72.2 kg)  Height: 6' (1.829 m)    PHYSICAL EXAM: General:  Well appearing. NAD HEENT: normal Neck: supple. no JVD. Carotids 2+ bilat; no bruits. No lymphadenopathy or thryomegaly appreciated. Cor: PMI nondisplaced. Regular rate & rhythm. No rubs, gallops or murmurs. Lungs:  clear Abdomen: Normal Extremities: no cyanosis, clubbing, rash, edema Neuro: alert & oriented x 3, cranial nerves grossly intact.  Affect normal.   Laboratory data:  Lab Results  Component Value Date   WBC 9.9 04/21/2016   HGB 14.6 04/21/2016   HCT 43.3 04/21/2016   PLT 330 04/21/2016   GLUCOSE 84 06/29/2016   CHOL 131 06/29/2016   TRIG 42 06/29/2016   HDL 43 06/29/2016   LDLCALC 80 06/29/2016   ALT 17 06/29/2016   AST 17 06/29/2016   NA 141 06/29/2016   K 4.7 06/29/2016   CL 106 06/29/2016   CREATININE 1.13 06/29/2016   BUN 22 06/29/2016   CO2 27 06/29/2016   TSH 1.59 06/29/2016   Lipid Panel     Component Value Date/Time   CHOL 131 06/29/2016 1011   TRIG 42 06/29/2016 1011   HDL 43 06/29/2016 1011   CHOLHDL 3.0 06/29/2016 1011   VLDL 8 06/29/2016 1011   LDLCALC 80 06/29/2016 1011      Echo 04/22/2016 LV EF: 50% - 55%  - Left ventricle: The cavity size was normal. Wall thickness was normal. Systolic function was normal. The estimated ejection fraction was in the range of 50% to 55%. Wall motion was normal; there were no regional wall motion abnormalities. - Mitral valve: Calcified annulus. There was mild regurgitation. - Left atrium: The atrium was moderately dilated. - Right atrium: The atrium was mildly dilated. - Pericardium, extracardiac: A trivial pericardial effusion was identified.  Impressions:  - Low normal LV systolic function; mild MR; biatrial enlargement; mild TR    TEE 04/23/2016 - Left ventricle: Systolic function was mildly reduced. The estimated ejection fraction was in the range of 45% to 50%. Diffuse hypokinesis. - Aortic valve: No evidence of vegetation. - Mitral valve: No evidence of vegetation. There was mild to moderate regurgitation. - Left atrium: No evidence of thrombus in the appendage. There was spontaneous echo contrast (&quot;smoke&quot;). - Right atrium: No evidence of thrombus in the  atrial cavity or appendage. - Atrial septum: No defect or patent foramen ovale was identified. Echo contrast study showed no right-to-left atrial level shunt, following an increase in RA pressure induced by provocative maneuvers. - Tricuspid valve: No evidence of vegetation. - Pulmonic valve: No evidence of vegetation. - Superior vena cava: The study excluded a thrombus.    04/23/2016 Electrical Cardioversion Procedure Note Procedure: Electrical Cardioversion Indications:Atrial Fibrillation Performed Procedure Details The patient was NPO after midnight. Anesthesia was administered at the beside by Dr. Antony Contras propofol. Cardioversion was performed with synchronized biphasic defibrillation via AP pads with 120, 150joules. 2attempt(s) were performed. The patient converted to  normal sinus rhythm. The patient tolerated the procedure well  IMPRESSION: Successful cardioversion of atrial fibrillation. On IV amiodarone.   ASSESSMENT & PLAN: 1. CAD    --s/p Promus DES to 99% mLAD stenosis 2011 (in [place]). Moderate nonobstructive CAD otherwise    --Myoview April 2016 was normal.    - he is asymptomatic. Continue  statin. Not a candidate for beta blocker due to chronic hypotension. Off ASA since now on Xarelto. 2. Atrial fibrillation s/p DCCV October 2017. Maintaining NSR on amiodarone. Will reduce dose to 100  mg daily. Check LFTs and TSH in the next month. Continue Xarelto. 3. History of hypoglycemia  4. Chronic hypotension. 5. Hyperlipidemia. Well controlled on statin  I will follow up in 6 months.   Peter [facility] 9:16 AM

## 2016-11-08 ENCOUNTER — Encounter: Payer: Self-pay | Admitting: Cardiology

## 2016-11-08 ENCOUNTER — Ambulatory Visit (INDEPENDENT_AMBULATORY_CARE_PROVIDER_SITE_OTHER): Payer: Medicare Other | Admitting: Cardiology

## 2016-11-08 VITALS — BP 100/60 | HR 70 | Ht 72.0 in | Wt 159.2 lb

## 2016-11-08 DIAGNOSIS — I2510 Atherosclerotic heart disease of native coronary artery without angina pectoris: Secondary | ICD-10-CM | POA: Diagnosis not present

## 2016-11-08 DIAGNOSIS — I4891 Unspecified atrial fibrillation: Secondary | ICD-10-CM | POA: Diagnosis not present

## 2016-11-08 DIAGNOSIS — E785 Hyperlipidemia, unspecified: Secondary | ICD-10-CM | POA: Diagnosis not present

## 2016-11-08 DIAGNOSIS — I480 Paroxysmal atrial fibrillation: Secondary | ICD-10-CM

## 2016-11-08 NOTE — Patient Instructions (Signed)
Reduce amiodarone to 100 mg daily  Continue your other therapy  We will arrange follow up blood work in the next month  I will see you in 6 months.

## 2016-12-07 ENCOUNTER — Telehealth: Payer: Self-pay | Admitting: Cardiology

## 2016-12-07 NOTE — Telephone Encounter (Signed)
New message    Pt is calling to find out if he needs to fast before lipid labs

## 2016-12-07 NOTE — Telephone Encounter (Signed)
Returned call-no answer, left message (ok per DPR) to fast for lipid panel.    Advised to call back with further questions.

## 2016-12-08 DIAGNOSIS — I2510 Atherosclerotic heart disease of native coronary artery without angina pectoris: Secondary | ICD-10-CM | POA: Diagnosis not present

## 2016-12-08 DIAGNOSIS — E785 Hyperlipidemia, unspecified: Secondary | ICD-10-CM | POA: Diagnosis not present

## 2016-12-08 DIAGNOSIS — I480 Paroxysmal atrial fibrillation: Secondary | ICD-10-CM | POA: Diagnosis not present

## 2016-12-08 DIAGNOSIS — I4891 Unspecified atrial fibrillation: Secondary | ICD-10-CM | POA: Diagnosis not present

## 2016-12-08 LAB — COMPREHENSIVE METABOLIC PANEL
ALBUMIN: 3.8 g/dL (ref 3.6–5.1)
ALK PHOS: 60 U/L (ref 40–115)
ALT: 17 U/L (ref 9–46)
AST: 16 U/L (ref 10–35)
BUN: 22 mg/dL (ref 7–25)
CALCIUM: 8.9 mg/dL (ref 8.6–10.3)
CHLORIDE: 105 mmol/L (ref 98–110)
CO2: 29 mmol/L (ref 20–31)
Creat: 1.19 mg/dL — ABNORMAL HIGH (ref 0.70–1.18)
Glucose, Bld: 81 mg/dL (ref 65–99)
POTASSIUM: 4.7 mmol/L (ref 3.5–5.3)
Sodium: 140 mmol/L (ref 135–146)
TOTAL PROTEIN: 6.4 g/dL (ref 6.1–8.1)
Total Bilirubin: 0.8 mg/dL (ref 0.2–1.2)

## 2016-12-08 LAB — TSH: TSH: 1.28 m[IU]/L (ref 0.40–4.50)

## 2016-12-09 ENCOUNTER — Telehealth: Payer: Self-pay | Admitting: Cardiology

## 2016-12-09 NOTE — Telephone Encounter (Signed)
Patient returning call to [facility]. Thanks.

## 2016-12-09 NOTE — Telephone Encounter (Signed)
Returned call to patient no answer.Left lab results on personal voice mail.

## 2016-12-14 ENCOUNTER — Other Ambulatory Visit (HOSPITAL_COMMUNITY): Payer: Self-pay | Admitting: Internal Medicine

## 2016-12-14 NOTE — Telephone Encounter (Signed)
Followed by Martinique

## 2016-12-30 ENCOUNTER — Other Ambulatory Visit: Payer: Self-pay | Admitting: Pharmacist Clinician (PhC)/ Clinical Pharmacy Specialist

## 2016-12-30 MED ORDER — RIVAROXABAN 20 MG PO TABS: 20.0000 mg | ORAL_TABLET | Freq: Every day | ORAL | 1 refills | Status: DC

## 2017-04-11 DIAGNOSIS — Z23 Encounter for immunization: Secondary | ICD-10-CM | POA: Diagnosis not present

## 2017-04-12 NOTE — Telephone Encounter (Signed)
Did not received records back from Dr. Fuller Plan

## 2017-04-29 ENCOUNTER — Encounter: Payer: Self-pay | Admitting: Physician Assistant

## 2017-04-30 NOTE — Progress Notes (Signed)
' Patient ID: Jerry Barr, male   DOB: [birth year], 76 y.o.   MRN: [id number]  PCP: Antony Contras MD Cardiologist: Dontay Harm Martinique MD  HPI:   Jerry Barr is a very pleasant 76 y/o male seen for follow up  CAD, HL, Afib, and hypoglycemia. He was originally referred by Dr. Carleene Cooper at [facility] in [facility]. He previously worked for [employer] for 30 years. 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   He has h/o CAD which manifested as indigestion and underwent PCI of LAD in 2011 with Promus 2.75x46mm DES. Otherwise has mild to moderate CAD. Myoview April  2016 was normal.   In October 2017 he was admitted with new onset Afib.  He was started on Xarelto and amiodarone. He underwent TEE guided DCCV. Echocardiogram obtained on 04/22/2016 showed EF 50-55%, no regional wall motion abnormality, mild MR. He eventually underwent successful TEE cardioversion on 04/23/2016. Subsequent PFTs were satisfactory.   He has  a h/o of hypoglycemia. He also has a history of chronic hypotension. He has noted two episodes of lightheadedness recently. One occurred at a football game and another yesterday. Felt shaky. Pulse was OK. He just ate and drank something and felt better in 30 minutes. Felt much worse when he had Afib before.   Does exercise regularly - a brisk walk 5-6 days/week for 45 minutes. Denies CP or indigestion.   ROS: as noted in HPI. All other systems are reviewed and are negative.   Past Medical History:  Diagnosis Date  . BPH (benign prostatic hyperplasia)   . Coronary artery disease    Promus drug-eluting stent to a 99% mid LAD, 40-50% narrowing in the distal LAD with mild to moderate disease in the circ and RCA  . Dyslipidemia   . ED (erectile dysfunction)   . Hyperplastic colon polyp   . Hypoglycemia     Current Outpatient Prescriptions  Medication Sig Dispense Refill  . amiodarone (PACERONE) 200 MG tablet Take 100 mg by mouth daily.  90 tablet 3  . atorvastatin (LIPITOR) 10 MG tablet TAKE 1 TABLET (10 MG TOTAL) BY MOUTH DAILY. 90 tablet 1  . Cholecalciferol (VITAMIN D-3) 1000 UNITS CAPS Take 1,000 Units by mouth daily.    . fluticasone (FLONASE) 50 MCG/ACT nasal spray Place 1 spray into both nostrils as needed for allergies.     Marland Kitchen loratadine (CLARITIN) 10 MG tablet Take 10 mg by mouth as needed for allergies.     . Multiple Vitamin (MULTIVITAMIN) tablet Take 1 tablet by mouth daily.    . Psyllium (METAMUCIL PO) Take 1 scoop by mouth.     . rivaroxaban (XARELTO) 20 MG TABS tablet Take 1 tablet (20 mg total) by mouth daily with supper. 90 tablet 1   No current facility-administered medications for this visit.     Allergies  Allergen Reactions  . Penicillins Other (See Comments)    States at one time he was allergic but has had it since and had no problem.       Social History   Social History  . Marital status: Married    Spouse name: N/A  . Number of children: N/A  . Years of education: N/A   Occupational History  . Not on file.   Social History Main Topics  . Smoking status: Never Smoker  . Smokeless tobacco: Never Used  . Alcohol use Not on file  . Drug use: No  .  Sexual activity: Not on file   Other Topics Concern  . Not on file   Social History Narrative  . No narrative on file      Family History  Problem Relation Age of Onset  . CAD Father   . Heart attack Paternal Grandmother     There were no vitals filed for this visit.  PHYSICAL EXAM: GENERAL:  Well appearing, thin WM in NAD HEENT:  PERRL, EOMI, sclera are clear. Oropharynx is clear. NECK:  No jugular venous distention, carotid upstroke brisk and symmetric, no bruits, no thyromegaly or adenopathy LUNGS:  Clear to auscultation bilaterally CHEST:  Unremarkable HEART:  RRR,  PMI not displaced or sustained,S1 and S2 within normal limits, no S3, no S4: no clicks, no rubs, no murmurs ABD:  Soft, nontender. BS +, no masses or bruits.  No hepatomegaly, no splenomegaly EXT:  2 + pulses throughout, no edema, no cyanosis no clubbing SKIN:  Warm and dry.  No rashes NEURO:  Alert and oriented x 3. Cranial nerves II through XII intact. PSYCH:  Cognitively intact    Laboratory data:  Lab Results  Component Value Date   WBC 9.9 04/21/2016   HGB 14.6 04/21/2016   HCT 43.3 04/21/2016   PLT 330 04/21/2016   GLUCOSE 81 12/08/2016   CHOL 131 06/29/2016   TRIG 42 06/29/2016   HDL 43 06/29/2016   LDLCALC 80 06/29/2016   ALT 17 12/08/2016   AST 16 12/08/2016   NA 140 12/08/2016   K 4.7 12/08/2016   CL 105 12/08/2016   CREATININE 1.19 (H) 12/08/2016   BUN 22 12/08/2016   CO2 29 12/08/2016   TSH 1.28 12/08/2016   Lipid Panel     Component Value Date/Time   CHOL 131 06/29/2016 1011   TRIG 42 06/29/2016 1011   HDL 43 06/29/2016 1011   CHOLHDL 3.0 06/29/2016 1011   VLDL 8 06/29/2016 1011   LDLCALC 80 06/29/2016 1011   Ecg today shows NSR with a PVC. Otherwise normal. I have personally reviewed and interpreted this study.    Echo 04/22/2016 LV EF: 50% - 55%  - Left ventricle: The cavity size was normal. Wall thickness was normal. Systolic function was normal. The estimated ejection fraction was in the range of 50% to 55%. Wall motion was normal; there were no regional wall motion abnormalities. - Mitral valve: Calcified annulus. There was mild regurgitation. - Left atrium: The atrium was moderately dilated. - Right atrium: The atrium was mildly dilated. - Pericardium, extracardiac: A trivial pericardial effusion was identified.  Impressions:  - Low normal LV systolic function; mild MR; biatrial enlargement; mild TR    TEE 04/23/2016 - Left ventricle: Systolic function was mildly reduced. The estimated ejection fraction was in the range of 45% to 50%. Diffuse hypokinesis. - Aortic valve: No evidence of vegetation. - Mitral valve: No evidence of vegetation. There was mild  to moderate regurgitation. - Left atrium: No evidence of thrombus in the appendage. There was spontaneous echo contrast (&quot;smoke&quot;). - Right atrium: No evidence of thrombus in the atrial cavity or appendage. - Atrial septum: No defect or patent foramen ovale was identified. Echo contrast study showed no right-to-left atrial level shunt, following an increase in RA pressure induced by provocative maneuvers. - Tricuspid valve: No evidence of vegetation. - Pulmonic valve: No evidence of vegetation. - Superior vena cava: The study excluded a thrombus.    04/23/2016 Electrical Cardioversion Procedure Note Procedure: Electrical Cardioversion Indications:Atrial Fibrillation Performed Procedure Details The  patient was NPO after midnight. Anesthesia was administered at the beside by Dr. Antony Contras propofol. Cardioversion was performed with synchronized biphasic defibrillation via AP pads with 120, 150joules. 2attempt(s) were performed. The patient converted to normal sinus rhythm. The patient tolerated the procedure well  IMPRESSION: Successful cardioversion of atrial fibrillation. On IV amiodarone.   ASSESSMENT & PLAN: 1. CAD    --s/p Promus DES to 99% mLAD stenosis 2011 (in [place]). Moderate nonobstructive CAD otherwise    --Myoview April 2016 was normal.    - he is asymptomatic. Continue  statin. Not a candidate for beta blocker due to chronic hypotension. Off ASA since now on Xarelto. 2. Atrial fibrillation s/p DCCV October 2017. Maintaining NSR on low dose amiodarone now 100 mg.  Continue Xarelto but may hold for 48 hours for colonoscopy. 3. History of hypoglycemia  4. Chronic hypotension. 5. Hyperlipidemia. Well controlled on statin. Will follow up chemistries, TSH and lipid panel in December.  I will follow up in 6 months.   Collier Salina [facility] 2:59 PM

## 2017-05-06 ENCOUNTER — Ambulatory Visit (INDEPENDENT_AMBULATORY_CARE_PROVIDER_SITE_OTHER): Payer: Medicare Other | Admitting: Cardiology

## 2017-05-06 ENCOUNTER — Encounter: Payer: Self-pay | Admitting: Cardiology

## 2017-05-06 VITALS — BP 104/67 | HR 67 | Ht 72.0 in | Wt 158.0 lb

## 2017-05-06 DIAGNOSIS — I480 Paroxysmal atrial fibrillation: Secondary | ICD-10-CM

## 2017-05-06 DIAGNOSIS — I959 Hypotension, unspecified: Secondary | ICD-10-CM

## 2017-05-06 DIAGNOSIS — I2510 Atherosclerotic heart disease of native coronary artery without angina pectoris: Secondary | ICD-10-CM

## 2017-05-06 MED ORDER — AMIODARONE HCL 100 MG PO TABS: 100.0000 mg | ORAL_TABLET | Freq: Every day | ORAL | 3 refills | Status: DC

## 2017-05-06 NOTE — Patient Instructions (Addendum)
Continue your current therapy  Come off Xarelto for 48 hours prior to colonoscopy

## 2017-05-10 ENCOUNTER — Telehealth: Payer: Self-pay | Admitting: Emergency Medicine

## 2017-05-10 ENCOUNTER — Encounter: Payer: Self-pay | Admitting: Physician Assistant

## 2017-05-10 ENCOUNTER — Ambulatory Visit (INDEPENDENT_AMBULATORY_CARE_PROVIDER_SITE_OTHER): Payer: Medicare Other | Admitting: Physician Assistant

## 2017-05-10 VITALS — BP 108/60 | HR 64 | Ht 72.0 in | Wt 160.0 lb

## 2017-05-10 DIAGNOSIS — Z800 Family history of malignant neoplasm of digestive organs: Secondary | ICD-10-CM

## 2017-05-10 DIAGNOSIS — Z7901 Long term (current) use of anticoagulants: Secondary | ICD-10-CM

## 2017-05-10 DIAGNOSIS — Z86010 Personal history of colonic polyps: Secondary | ICD-10-CM

## 2017-05-10 MED ORDER — NA SULFATE-K SULFATE-MG SULF 17.5-3.13-1.6 GM/177ML PO SOLN: 1.0000 | ORAL | 0 refills | Status: DC

## 2017-05-10 NOTE — Patient Instructions (Signed)

## 2017-05-10 NOTE — Telephone Encounter (Signed)
   Jerry Barr 21-Jul-1940 [id number]  Dear Jerry Barr:  We have scheduled the above named patient for a colonoscopy procedure. Our records show that he is on anticoagulation therapy.  Please advise as to whether the patient may come off their therapy of Xarelto 1 days prior to their procedure which is scheduled for 05-24-17.  Please route your response to Tinnie Gens, CMA or fax response to [phone].  Sincerely,    [facility]

## 2017-05-10 NOTE — Progress Notes (Signed)
Chief Complaint: Consult for surveillance colonoscopy in a patient on chronic anticoagulation  HPI:  Mr. Jerry Barr is a 76 year old Caucasian meal with a past medical history as listed below including A. Fib maintained on Xarelto for the past year (last EF 04/22/16 50-55%),  who was referred to me by Antony Contras, MD for consult of a surveillance colonoscopy due to his history of tubular adenomas and family history of colon cancer .     Patient followed in clinic with Dr. Fuller Plan in the past for a screening colonoscopy, the last one completed here was 03/22/01. There was a finding of internal hemorrhoids and diverticulosis and this was otherwise normal. Repeat was recommended in 5 years due to patient's family history of colorectal neoplasia in one of his parents.  Patient then moved to [place] and had 2 further screening colonoscopies there. The last 05/17/12.  Review of this record from outside endoscopy center shows a 5 mm polyp in the transverse colon, mild diverticulosis of the left side of the colon and internal hemorrhoids. Repeat was recommended in 5 years.   Today, the patient presents clinic and explains that he went into A. Fib over a year ago and was started on Xarelto for this. He has never had to stop this medication, as he has been quite healthy for the past year, but has already discussed holding this medication for 2 days with his cardiologist at their appointment last week and this was okayed by them. Patient tells me he has no complaints today but knows he is due for repeat colonoscopy for family history of colon cancer and his own history of colon polyps.   Patient denies fever, chills, blood in the stool, melena, weight loss, anorexia, change in bowel habits, heartburn,or abdominal pain.  Past Medical History:  Diagnosis Date  . BPH (benign prostatic hyperplasia)   . Coronary artery disease    Promus drug-eluting stent to a 99% mid LAD, 40-50% narrowing in the distal LAD with  mild to moderate disease in the circ and RCA  . Dyslipidemia   . ED (erectile dysfunction)   . Hyperplastic colon polyp   . Hypoglycemia     Past Surgical History:  Procedure Laterality Date  . CARDIOVERSION N/A 04/23/2016   Procedure: CARDIOVERSION;  Surgeon: [name], MD;  Location: [place];  Service: Cardiovascular;  Laterality: N/A;  . CORONARY ANGIOPLASTY WITH STENT PLACEMENT    . TEE WITHOUT CARDIOVERSION N/A 04/23/2016   Procedure: TRANSESOPHAGEAL ECHOCARDIOGRAM (TEE);  Surgeon: [name], MD;  Location: [facility] ENDOSCOPY;  Service: Cardiovascular;  Laterality: N/A;    Current Outpatient Prescriptions  Medication Sig Dispense Refill  . amiodarone (PACERONE) 100 MG tablet Take 1 tablet (100 mg total) by mouth daily. 90 tablet 3  . atorvastatin (LIPITOR) 10 MG tablet TAKE 1 TABLET (10 MG TOTAL) BY MOUTH DAILY. 90 tablet 1  . Cholecalciferol (VITAMIN D-3) 1000 UNITS CAPS Take 1,000 Units by mouth daily.    . fluticasone (FLONASE) 50 MCG/ACT nasal spray Place 1 spray into both nostrils as needed for allergies.     Marland Kitchen loratadine (CLARITIN) 10 MG tablet Take 10 mg by mouth as needed for allergies.     . Multiple Vitamin (MULTIVITAMIN) tablet Take 1 tablet by mouth daily.    . Psyllium (METAMUCIL PO) Take 1 scoop by mouth.     . rivaroxaban (XARELTO) 20 MG TABS tablet Take 1 tablet (20 mg total) by mouth daily with supper. 90 tablet 1  .  Na Sulfate-K Sulfate-Mg Sulf (SUPREP BOWEL PREP KIT) 17.5-3.13-1.6 GM/177ML SOLN Take 1 kit by mouth as directed. 324 mL 0   No current facility-administered medications for this visit.     Allergies as of 05/10/2017 - Review Complete 05/10/2017  Allergen Reaction Noted  . Penicillins Other (See Comments) 07/04/2012    Family History  Problem Relation Age of Onset  . CAD Father   . Heart attack Paternal Grandmother     Social History   Social History  . Marital status: Married    Spouse name: N/A  . Number of children: N/A  .  Years of education: N/A   Occupational History  . Not on file.   Social History Main Topics  . Smoking status: Never Smoker  . Smokeless tobacco: Never Used  . Alcohol use Not on file  . Drug use: No  . Sexual activity: Not on file   Other Topics Concern  . Not on file   Social History Narrative  . No narrative on file    Review of Systems:    Constitutional: No weight loss, fever or chills Skin: No rash  Cardiovascular: No chest pain Respiratory: No SOB  Gastrointestinal: See HPI and otherwise negative Genitourinary: No dysuria  Neurological: No headache Musculoskeletal: No new muscle or joint pain Hematologic: No bleeding  Psychiatric: No history of depression or anxiety   Physical Exam:  Vital signs: BP 108/60   Pulse 64   Ht 6' (1.829 m)   Wt 160 lb (72.6 kg)   BMI 21.70 kg/m   Constitutional:   Pleasant Elderly Caucasian male appears to be in NAD, Well developed, Well nourished, alert and cooperative Head:  Normocephalic and atraumatic. Eyes:   PEERL, EOMI. No icterus. Conjunctiva pink. Ears:  Normal auditory acuity. Neck:  Supple Throat: Oral cavity and pharynx without inflammation, swelling or lesion.  Respiratory: Respirations even and unlabored. Lungs clear to auscultation bilaterally.   No wheezes, crackles, or rhonchi.  Cardiovascular: Normal S1, S2. No MRG. Regular rate and rhythm. No peripheral edema, cyanosis or pallor.  Gastrointestinal:  Soft, nondistended, nontender. No rebound or guarding. Normal bowel sounds. No appreciable masses or hepatomegaly. Rectal:  Not performed.  Msk:  Symmetrical without gross deformities. Without edema, no deformity or joint abnormality.  Neurologic:  Alert and  oriented x4;  grossly normal neurologically.  Skin:   Dry and intact without significant lesions or rashes. Psychiatric:  Demonstrates good judgement and reason without abnormal affect or behaviors.  MOST RECENT LABS AND IMAGING: CBC    Component Value  Date/Time   WBC 9.9 04/21/2016 0549   RBC 4.79 04/21/2016 0549   HGB 14.6 04/21/2016 0549   HCT 43.3 04/21/2016 0549   PLT 330 04/21/2016 0549   MCV 90.4 04/21/2016 0549   MCH 30.5 04/21/2016 0549   MCHC 33.7 04/21/2016 0549   RDW 13.5 04/21/2016 0549   LYMPHSABS 1.2 04/20/2016 0745   MONOABS 0.7 04/20/2016 0745   EOSABS 0.2 04/20/2016 0745   BASOSABS 0.0 04/20/2016 0745    CMP     Component Value Date/Time   NA 140 12/08/2016 1000   K 4.7 12/08/2016 1000   CL 105 12/08/2016 1000   CO2 29 12/08/2016 1000   GLUCOSE 81 12/08/2016 1000   BUN 22 12/08/2016 1000   CREATININE 1.19 (H) 12/08/2016 1000   CALCIUM 8.9 12/08/2016 1000   PROT 6.4 12/08/2016 1000   ALBUMIN 3.8 12/08/2016 1000   AST 16 12/08/2016 1000   ALT 17  12/08/2016 1000   ALKPHOS 60 12/08/2016 1000   BILITOT 0.8 12/08/2016 1000   GFRNONAA >60 04/21/2016 0549   GFRAA >60 04/21/2016 0549    Assessment: 1. History of colon polyps: last colonoscopy 5 years ago with a finding of a colon polyp, we do not have pathology results as this was done in [place], repeat was recommended in 5 years per pt 2. Family history of colon cancer: in one of his parents 3. Chronic anticoagulation: on Xarelto for A. fib  Plan: 1. Scheduled patient for a screening colonoscopy in the [place] with Dr. Fuller Plan. Did discuss risks, benefits, limitations and alternatives the patient agrees to proceed. 2. Patient was advised to hold his Xarelto for 2 days prior to time of this procedure. We will communicate with his cardiologist to ensure that holding his Xarelto is acceptable for him. 3. Patient to follow in clinic per recommendations from Dr. Fuller Plan after time of procedure.  Ellouise Newer, PA-C [facility] 05/10/2017, 11:55 AM  Cc: Antony Contras, MD

## 2017-05-10 NOTE — Progress Notes (Signed)
Reviewed and agree with management plan.  Shelvy Heckert T. Hilde Churchman, MD FACG 

## 2017-05-10 NOTE — Telephone Encounter (Signed)
Patient with diagnosis of atrial fibrillation on Xarelto for anticoagulation.    Procedure: colonoscopy Date of procedure: 05/24/17  CHADS2 score of 1 (AGE);  CHADS2-VASc score of  3 (AGE x 2, CAD)  CrCl 54.4  Per office protocol, patient can hold Xarelto for 1 days prior to procedure.

## 2017-05-10 NOTE — Telephone Encounter (Signed)
Spoke to patient and informed him to take last dose of Xarelto on 05-22-17 and to not have any on 05-23-17 and 05-24-17 day of the procedure. Patient understands and agrees with treatment plan.

## 2017-05-11 DIAGNOSIS — L570 Actinic keratosis: Secondary | ICD-10-CM | POA: Diagnosis not present

## 2017-05-11 DIAGNOSIS — C44311 Basal cell carcinoma of skin of nose: Secondary | ICD-10-CM | POA: Diagnosis not present

## 2017-05-23 ENCOUNTER — Telehealth: Payer: Self-pay | Admitting: Cardiology

## 2017-05-23 DIAGNOSIS — I2510 Atherosclerotic heart disease of native coronary artery without angina pectoris: Secondary | ICD-10-CM | POA: Diagnosis not present

## 2017-05-23 DIAGNOSIS — I4891 Unspecified atrial fibrillation: Secondary | ICD-10-CM | POA: Diagnosis not present

## 2017-05-23 DIAGNOSIS — I700 Atherosclerosis of aorta: Secondary | ICD-10-CM | POA: Diagnosis not present

## 2017-05-23 DIAGNOSIS — E7800 Pure hypercholesterolemia, unspecified: Secondary | ICD-10-CM | POA: Diagnosis not present

## 2017-05-23 DIAGNOSIS — I951 Orthostatic hypotension: Secondary | ICD-10-CM | POA: Diagnosis not present

## 2017-05-23 NOTE — Telephone Encounter (Signed)
Received call from patient.He stated his dizziness is worse.He saw PCP and was told  he has orthostatic hypotension.He was told to see Dr.Jordan, amiodarone may be causing.Appointment scheduled with Dr.Jordan 05/24/17 at 3:20 pm.

## 2017-05-24 ENCOUNTER — Encounter: Payer: Self-pay | Admitting: Cardiology

## 2017-05-24 ENCOUNTER — Encounter: Payer: Medicare Other | Admitting: Gastroenterology

## 2017-05-24 ENCOUNTER — Ambulatory Visit (INDEPENDENT_AMBULATORY_CARE_PROVIDER_SITE_OTHER): Payer: Medicare Other | Admitting: Cardiology

## 2017-05-24 VITALS — BP 98/64 | HR 80 | Ht 72.0 in | Wt 162.4 lb

## 2017-05-24 DIAGNOSIS — I480 Paroxysmal atrial fibrillation: Secondary | ICD-10-CM

## 2017-05-24 DIAGNOSIS — I2510 Atherosclerotic heart disease of native coronary artery without angina pectoris: Secondary | ICD-10-CM | POA: Diagnosis not present

## 2017-05-24 DIAGNOSIS — I951 Orthostatic hypotension: Secondary | ICD-10-CM

## 2017-05-24 MED ORDER — MIDODRINE HCL 5 MG PO TABS: 5.0000 mg | ORAL_TABLET | Freq: Three times a day (TID) | ORAL | 11 refills | Status: DC

## 2017-05-24 NOTE — Patient Instructions (Signed)
We will add midodrine 5 mg three times a day  Continue your other medication.

## 2017-05-24 NOTE — Progress Notes (Signed)
' Patient ID: Jerry Barr, male   DOB: 10-04-1940, 76 y.o.   MRN: [id number]  PCP: Antony Contras MD Cardiologist: Peter Martinique MD  HPI:   Jerry Barr is a very pleasant 76 y/o male seen for evaluation of orthostatic hypotension. He has a history of  CAD, HL, Afib, and hypoglycemia. He was originally referred by Dr. Carleene Cooper at [facility] in [facility]. He previously worked for [employer] for 30 years. 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   He has h/o CAD which manifested as indigestion and underwent PCI of LAD in 2011 with Promus 2.75x67m DES. Otherwise has mild to moderate CAD. Myoview April  2016 was normal.   In October 2017 he was admitted with new onset Afib.  He was started on Xarelto and amiodarone. He underwent TEE guided DCCV. Echocardiogram obtained on 04/22/2016 showed EF 50-55%, no regional wall motion abnormality, mild MR. He eventually underwent successful TEE cardioversion on 04/23/2016. Subsequent PFTs were satisfactory.   He has  a h/o of hypoglycemia. He also has a history of chronic hypotension. He was seen recently and noted  noted two episodes of lightheadedness. We recommended conservative measures with liberalization of sodium intake, support hose and good hydration. Over the past 2 weeks he has had a total of 7 episodes of lightheadedness with standing and feeling woozy. No syncope. Was seen by Dr. SMoreen Fowlerand noted to have orthostatic hypotension.  ROS: as noted in HPI. All other systems are reviewed and are negative.   Past Medical History:  Diagnosis Date  . BPH (benign prostatic hyperplasia)   . Coronary artery disease    Promus drug-eluting stent to a 99% mid LAD, 40-50% narrowing in the distal LAD with mild to moderate disease in the circ and RCA  . Dyslipidemia   . ED (erectile dysfunction)   . Hyperplastic colon polyp   . Hypoglycemia     Current Outpatient Medications  Medication Sig Dispense  Refill  . amiodarone (PACERONE) 100 MG tablet Take 1 tablet (100 mg total) by mouth daily. 90 tablet 3  . atorvastatin (LIPITOR) 10 MG tablet TAKE 1 TABLET (10 MG TOTAL) BY MOUTH DAILY. 90 tablet 1  . Cholecalciferol (VITAMIN D-3) 1000 UNITS CAPS Take 1,000 Units by mouth daily.    . fluticasone (FLONASE) 50 MCG/ACT nasal spray Place 1 spray into both nostrils as needed for allergies.     .Marland Kitchenloratadine (CLARITIN) 10 MG tablet Take 10 mg by mouth as needed for allergies.     . Multiple Vitamin (MULTIVITAMIN) tablet Take 1 tablet by mouth daily.    . Na Sulfate-K Sulfate-Mg Sulf (SUPREP BOWEL PREP KIT) 17.5-3.13-1.6 GM/177ML SOLN Take 1 kit by mouth as directed. 324 mL 0  . Psyllium (METAMUCIL PO) Take 1 scoop by mouth.     . rivaroxaban (XARELTO) 20 MG TABS tablet Take 1 tablet (20 mg total) by mouth daily with supper. 90 tablet 1  . midodrine (PROAMATINE) 5 MG tablet Take 1 tablet (5 mg total) 3 (three) times daily with meals by mouth. 90 tablet 11   No current facility-administered medications for this visit.     Allergies  Allergen Reactions  . Penicillins Other (See Comments)    States at one time he was allergic but has had it since and had no problem.       Social History   Socioeconomic History  . Marital status: Married  Spouse name: Not on file  . Number of children: Not on file  . Years of education: Not on file  . Highest education level: Not on file  Social Needs  . Financial resource strain: Not on file  . Food insecurity - worry: Not on file  . Food insecurity - inability: Not on file  . Transportation needs - medical: Not on file  . Transportation needs - non-medical: Not on file  Occupational History  . Not on file  Tobacco Use  . Smoking status: Never Smoker  . Smokeless tobacco: Never Used  Substance and Sexual Activity  . Alcohol use: Not on file  . Drug use: No  . Sexual activity: Not on file  Other Topics Concern  . Not on file  Social History  Narrative  . Not on file      Family History  Problem Relation Age of Onset  . CAD Father   . Heart attack Paternal Grandmother     Vitals:   05/24/17 1521  BP: 98/64  Pulse: 80  Weight: 162 lb 6.4 oz (73.7 kg)  Height: 6' (1.829 m)    PHYSICAL EXAM: GENERAL:  Well appearing HEENT:  PERRL, EOMI, sclera are clear. Oropharynx is clear. NECK:  No jugular venous distention, carotid upstroke brisk and symmetric, no bruits, no thyromegaly or adenopathy LUNGS:  Clear to auscultation bilaterally CHEST:  Unremarkable HEART:  RRR,  PMI not displaced or sustained,S1 and S2 within normal limits, no S3, no S4: no clicks, no rubs, no murmurs ABD:  Soft, nontender. BS +, no masses or bruits. No hepatomegaly, no splenomegaly EXT:  2 + pulses throughout, no edema, no cyanosis no clubbing SKIN:  Warm and dry.  No rashes NEURO:  Alert and oriented x 3. Cranial nerves II through XII intact. PSYCH:  Cognitively intact      Laboratory data:  Lab Results  Component Value Date   WBC 9.9 04/21/2016   HGB 14.6 04/21/2016   HCT 43.3 04/21/2016   PLT 330 04/21/2016   GLUCOSE 81 12/08/2016   CHOL 131 06/29/2016   TRIG 42 06/29/2016   HDL 43 06/29/2016   LDLCALC 80 06/29/2016   ALT 17 12/08/2016   AST 16 12/08/2016   NA 140 12/08/2016   K 4.7 12/08/2016   CL 105 12/08/2016   CREATININE 1.19 (H) 12/08/2016   BUN 22 12/08/2016   CO2 29 12/08/2016   TSH 1.28 12/08/2016   Lipid Panel     Component Value Date/Time   CHOL 131 06/29/2016 1011   TRIG 42 06/29/2016 1011   HDL 43 06/29/2016 1011   CHOLHDL 3.0 06/29/2016 1011   VLDL 8 06/29/2016 1011   LDLCALC 80 06/29/2016 1011    Echo 04/22/2016 LV EF: 50% - 55%  - Left ventricle: The cavity size was normal. Wall thickness was normal. Systolic function was normal. The estimated ejection fraction was in the range of 50% to 55%. Wall motion was normal; there were no regional wall motion abnormalities. - Mitral valve:  Calcified annulus. There was mild regurgitation. - Left atrium: The atrium was moderately dilated. - Right atrium: The atrium was mildly dilated. - Pericardium, extracardiac: A trivial pericardial effusion was identified.  Impressions:  - Low normal LV systolic function; mild MR; biatrial enlargement; mild TR     ASSESSMENT & PLAN: 1. Orthostatic hypotension with accelerated symptoms despite conservative measures. He is on no medication that would cause this. Need to pursue medical therapy. Discussed options of midodrine  versus fluorinef. Will start midodrine 5 mg tid. I will see back in 4 weeks for follow up. Can titrate further if need be.  2.  CAD    --s/p Promus DES to 99% mLAD stenosis 2011 (in [place]). Moderate nonobstructive CAD otherwise    --Myoview April 2016 was normal.    - he is asymptomatic.  3. Atrial fibrillation s/p DCCV October 2017. Maintaining NSR on low dose amiodarone now 100 mg.  Continue Xarelto but may hold for 48 hours for colonoscopy. 4. History of hypoglycemia  5. Hyperlipidemia. Well controlled on statin. Will follow up chemistries, TSH and lipid panel in December.  I will follow up in 4 weeks.   Collier Salina [facility] 3:34 PM

## 2017-06-09 ENCOUNTER — Other Ambulatory Visit (HOSPITAL_COMMUNITY): Payer: Self-pay | Admitting: Cardiology

## 2017-06-12 ENCOUNTER — Observation Stay (HOSPITAL_COMMUNITY)
Admission: EM | Admit: 2017-06-12 | Discharge: 2017-06-13 | Disposition: A | Discharge status: Home / Self Care | Payer: Medicare Other | Attending: Internal Medicine | Admitting: Internal Medicine

## 2017-06-12 ENCOUNTER — Encounter (HOSPITAL_COMMUNITY): Payer: Self-pay | Admitting: Emergency Medicine

## 2017-06-12 ENCOUNTER — Emergency Department (HOSPITAL_COMMUNITY): Payer: Medicare Other

## 2017-06-12 DIAGNOSIS — R0789 Other chest pain: Secondary | ICD-10-CM | POA: Diagnosis not present

## 2017-06-12 DIAGNOSIS — Z955 Presence of coronary angioplasty implant and graft: Secondary | ICD-10-CM | POA: Diagnosis not present

## 2017-06-12 DIAGNOSIS — R079 Chest pain, unspecified: Secondary | ICD-10-CM

## 2017-06-12 DIAGNOSIS — Z79899 Other long term (current) drug therapy: Secondary | ICD-10-CM | POA: Insufficient documentation

## 2017-06-12 DIAGNOSIS — I2510 Atherosclerotic heart disease of native coronary artery without angina pectoris: Secondary | ICD-10-CM | POA: Diagnosis not present

## 2017-06-12 DIAGNOSIS — I209 Angina pectoris, unspecified: Secondary | ICD-10-CM | POA: Diagnosis present

## 2017-06-12 DIAGNOSIS — I480 Paroxysmal atrial fibrillation: Secondary | ICD-10-CM | POA: Diagnosis not present

## 2017-06-12 DIAGNOSIS — R072 Precordial pain: Secondary | ICD-10-CM | POA: Diagnosis not present

## 2017-06-12 DIAGNOSIS — E785 Hyperlipidemia, unspecified: Secondary | ICD-10-CM | POA: Diagnosis not present

## 2017-06-12 DIAGNOSIS — I951 Orthostatic hypotension: Secondary | ICD-10-CM | POA: Diagnosis not present

## 2017-06-12 LAB — BASIC METABOLIC PANEL
ANION GAP: 8 (ref 5–15)
BUN: 23 mg/dL — ABNORMAL HIGH (ref 6–20)
CALCIUM: 9 mg/dL (ref 8.9–10.3)
CO2: 26 mmol/L (ref 22–32)
Chloride: 101 mmol/L (ref 101–111)
Creatinine, Ser: 1.19 mg/dL (ref 0.61–1.24)
GFR calc Af Amer: >60 mL/min (ref >60)
GFR, EST NON AFRICAN AMERICAN: 58 mL/min — AB (ref >60)
GLUCOSE: 91 mg/dL (ref 65–99)
POTASSIUM: 4.2 mmol/L (ref 3.5–5.1)
SODIUM: 135 mmol/L (ref 135–145)

## 2017-06-12 LAB — TROPONIN I

## 2017-06-12 LAB — CBC
HEMATOCRIT: 43.9 % (ref 39.0–52.0)
HEMOGLOBIN: 14.6 g/dL (ref 13.0–17.0)
MCH: 30.7 pg (ref 26.0–34.0)
MCHC: 33.3 g/dL (ref 30.0–36.0)
MCV: 92.4 fL (ref 78.0–100.0)
Platelets: 255 10*3/uL (ref 150–400)
RBC: 4.75 MIL/uL (ref 4.22–5.81)
RDW: 13.9 % (ref 11.5–15.5)
WBC: 8.4 10*3/uL (ref 4.0–10.5)

## 2017-06-12 LAB — I-STAT TROPONIN, ED: TROPONIN I, POC: 0 ng/mL (ref 0.00–0.08)

## 2017-06-12 MED ORDER — FLUTICASONE PROPIONATE 50 MCG/ACT NA SUSP: 1.0000 | Freq: Every day | NASAL | Status: DC | PRN

## 2017-06-12 MED ORDER — AMIODARONE HCL 100 MG PO TABS
100.0000 mg | ORAL_TABLET | Freq: Every day | ORAL | Status: DC
  Administered 2017-06-13: 100 mg via ORAL
  Filled 2017-06-12: qty 1

## 2017-06-12 MED ORDER — MIDODRINE HCL 5 MG PO TABS
5.0000 mg | ORAL_TABLET | Freq: Three times a day (TID) | ORAL | Status: DC
  Filled 2017-06-12: qty 1

## 2017-06-12 MED ORDER — ACETAMINOPHEN 500 MG PO TABS
500.0000 mg | ORAL_TABLET | Freq: Four times a day (QID) | ORAL | Status: DC | PRN
  Administered 2017-06-13: 500 mg via ORAL
  Filled 2017-06-12: qty 1

## 2017-06-12 MED ORDER — ATORVASTATIN CALCIUM 10 MG PO TABS
10.0000 mg | ORAL_TABLET | Freq: Every day | ORAL | Status: DC
  Administered 2017-06-13: 10 mg via ORAL
  Filled 2017-06-12: qty 1

## 2017-06-12 MED ORDER — RIVAROXABAN 20 MG PO TABS: 20.0000 mg | ORAL_TABLET | Freq: Every day | ORAL | Status: DC

## 2017-06-12 MED ORDER — VITAMIN D 1000 UNITS PO TABS
1000.0000 [IU] | ORAL_TABLET | Freq: Every day | ORAL | Status: DC
  Administered 2017-06-13: 1000 [IU] via ORAL
  Filled 2017-06-12: qty 1

## 2017-06-12 MED ORDER — LORATADINE 10 MG PO TABS
10.0000 mg | ORAL_TABLET | Freq: Every day | ORAL | Status: DC
  Administered 2017-06-13: 10 mg via ORAL
  Filled 2017-06-12: qty 1

## 2017-06-12 NOTE — H&P (Signed)
CARDIOLOGY HISTORY AND PHYSICAL     Date: 06/12/2017 Admitting Physician: No admitting provider for patient encounter.  Chief Complaint:  Chest pressure ____________________________________________________________________ History of Present Illness:  Jerry Barr is a 76 y.o. old male with medical history noted below presents with acute complaints of chest pressure that occurred while at rest earlier today.  He has a CAD history with a stent in the LAD back in 2011.  At that time he developed what he described as indigestion while jogging that was relived with rest.  He has has been having periods of hypotension which has been treated with midodrine.  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  He states all morning he didn't feel well, however, he continued on with going to church.  After coming back home, he states he was sitting and suddenly felt 8/10 pressure in the middle of his chest which radiated to his back.  At this point he decided to come to the ED for evaluation.  He denies periods of dyspnea, lightheadedness, dizziness, or syncope.  On my exam he states the pressure has resolved.  Vitals are all stable.  Troponin is negative and ECG is unremarkable.    Review of Systems:   Review of Systems:  GEN: no fever, chills, nausea, vomiting, weight change  HEENT: no vision or hearing changes  PULM: no coughing, SOB  CV: +chest pain, palpitations, PND, orthopnea  GI: no abdominal pain  GU: no dysuria  EXT: no swelling  SKIN: no rashes  NEURO: no numbness or tingling  HEME: no bleeding or bruising  GYN: none  --12 point review systems- otherwise negative.  All other systems reviewed and are negative  Past Medical History:  Diagnosis Date  . BPH (benign prostatic hyperplasia)   . Coronary artery disease    Promus drug-eluting stent to a 99% mid LAD, 40-50% narrowing in  the distal LAD with mild to moderate disease in the circ and RCA  . Dyslipidemia   . ED (erectile dysfunction)   . Hyperplastic colon polyp   . Hypoglycemia     Past Surgical History:  Procedure Laterality Date  . CARDIOVERSION N/A 04/23/2016   Procedure: CARDIOVERSION;  Surgeon: [name], MD;  Location: [place];  Service: Cardiovascular;  Laterality: N/A;  . CORONARY ANGIOPLASTY WITH STENT PLACEMENT    . TEE WITHOUT CARDIOVERSION N/A 04/23/2016   Procedure: TRANSESOPHAGEAL ECHOCARDIOGRAM (TEE);  Surgeon: [name], MD;  Location: [facility] ENDOSCOPY;  Service: Cardiovascular;  Laterality: N/A;    Social History   Socioeconomic History  . Marital status: Married    Spouse name: Not on file  . Number of children: Not on file  . Years of education: Not on file  . Highest education level: Not on file  Social Needs  . Financial resource strain: Not on file  . Food insecurity - worry: Not on file  . Food insecurity - inability: Not on file  . Transportation needs - medical: Not on file  . Transportation needs - non-medical: Not on file  Occupational History  . Not on file  Tobacco Use  . Smoking status: Never Smoker  . Smokeless tobacco: Never Used  Substance and Sexual Activity  . Alcohol use: Not on file  . Drug use: No  . Sexual activity: Not on file  Other Topics Concern  . Not on  file  Social History Narrative  . Not on file    Family History  Problem Relation Age of Onset  . CAD Father   . Heart attack Paternal Grandmother     Past Cardiovascular History:  +CAD +MI - No documented h/o CHF - No documented h/o PVD - No documented h/o AAA - No documented h/o valvular heart disease - No documented h/o CVA - No documented h/o Arrhythmias +A-fib  - No documented h/o congenital heart disease - No documented h/o CABG - No documented h/o PCI - No documented h/o cardiac devices (Pacer/ICD/CRT) - No documented h/o cardiac surgery       Echo  04/22/2016 LV EF: 50% - 55%  - Left ventricle: The cavity size was normal. Wall thickness was normal. Systolic function was normal. The estimated ejection fraction was in the range of 50% to 55%. Wall motion was normal; there were no regional wall motion abnormalities. - Mitral valve: Calcified annulus. There was mild regurgitation. - Left atrium: The atrium was moderately dilated. - Right atrium: The atrium was mildly dilated. - Pericardium, extracardiac: A trivial pericardial effusion was identified.  Impressions:  - Low normal LV systolic function; mild MR; biatrial enlargement; mild TR  Myoveiw in April of 2016 was normal   Prior to Admission medications   Medication Sig Start Date End Date Taking? Authorizing Provider  acetaminophen (TYLENOL) 500 MG tablet Take 500 mg by mouth every 6 (six) hours as needed for headache (pain).   Yes [provider]  amiodarone (PACERONE) 100 MG tablet Take 1 tablet (100 mg total) by mouth daily. 05/06/17  Yes Martinique, Peter M, MD  atorvastatin (LIPITOR) 10 MG tablet TAKE 1 TABLET BY MOUTH EVERY DAY Patient taking differently: TAKE 1 TABLET (10 MG) BY MOUTH EVERY MORNING 06/09/17  Yes Martinique, Peter M, MD  Cholecalciferol (VITAMIN D-3) 1000 UNITS CAPS Take 1,000 Units by mouth daily.   Yes [provider]  fluticasone (FLONASE) 50 MCG/ACT nasal spray Place 1 spray into both nostrils daily as needed (seasonal allergies).    Yes [provider]  loratadine (CLARITIN) 10 MG tablet Take 10 mg by mouth daily.    Yes [provider]  midodrine (PROAMATINE) 5 MG tablet Take 1 tablet (5 mg total) 3 (three) times daily with meals by mouth. 05/24/17  Yes Martinique, Peter M, MD  Psyllium (METAMUCIL PO) Take 30 mLs by mouth daily after lunch. Mix in liquid and drink   Yes [provider]  rivaroxaban (XARELTO) 20 MG TABS tablet Take 1 tablet (20 mg total) by mouth daily with supper. Patient taking  differently: Take 20 mg by mouth daily with breakfast.  12/30/16  Yes Martinique, Peter M, MD  Na Sulfate-K Sulfate-Mg Sulf (SUPREP BOWEL PREP KIT) 17.5-3.13-1.6 GM/177ML SOLN Take 1 kit by mouth as directed. Patient not taking: Reported on 06/12/2017 05/10/17   Levin Erp, PA    Allergies  Allergen Reactions  . Penicillins Rash    Pt has had other "cillins" since minor reaction 50 years ago with no reaction. Has patient had a PCN reaction causing immediate rash, facial/tongue/throat swelling, SOB or lightheadedness with hypotension: Yes Has patient had a PCN reaction causing severe rash involving mucus membranes or skin necrosis: No Has patient had a PCN reaction that required hospitalization: No Has patient had a PCN reaction occurring within the last 10 years: No If all of the above answers are "NO", then may proceed with     Social History:  Social History   Tobacco Use  . Smoking status: Never Smoker  . Smokeless tobacco: Never Used  Substance Use Topics  . Alcohol use: Not on file  . Drug use: No    Family History  Problem Relation Age of Onset  . CAD Father   . Heart attack Paternal Grandmother     Physical Examination: Blood pressure (!) 139/93, pulse 64, temperature 97.9 F (36.6 C), temperature source Oral, resp. rate 17, SpO2 95 %. General:  AAOX 4.  NAD.  NRD.  HENT: Normocephalic. Atraumatic.  No acute abnom. EYES: PERRL EOMI  Neck: Supple.  No JVD.  No bruits. Cardiovascular:  Nl S1. Nl S2. No S3. No S4. Nl PMI. No m/r/c. RRR  Pulmonary/Chest: CTA B. No rales. No wheezing.  Abdomen: Soft, NT, no masses, no organomegaly. Neuro: CN intact, no motor/sensory deficit.  Ext: Warm. No edema.  SKIN- intact  No intake or output data in the 24 hours ending 06/12/17 1930  Troponin (Point of Care Test) Recent Labs    06/12/17 1602  TROPIPOC 0.00   ____________________________________________________________________ Assessment/Plan  Chest Pressure    Assessment:  Patient with history of CAD with critical stenosis of the LAD s/p DES in 2011.  Chest pain certainly consistent with angina though it's onset with rest it atypical.  Will admit for observation and possible stress test tomorrow pending serial troponin measurements.  Currently pain free with normal vitals.     Plan  -  Continue home medical management  -  Will forgo ACS management for now  -  Plan for stress test tomorrow pending serial troponin measurements  Thank you for consulting cardiology.    Electronically signed by Lowella Dandy 06/12/2017 Baruch Merl, MD, PhD Cardiology

## 2017-06-12 NOTE — ED Notes (Signed)
Patient transported to X-ray 

## 2017-06-12 NOTE — ED Notes (Signed)
Assisted pt too restroom in wheelchair no difficulties, placed back on tele.

## 2017-06-12 NOTE — ED Notes (Signed)
Cards at bedside

## 2017-06-12 NOTE — ED Notes (Signed)
Called kitchen regarding meal order placed over an hour ago, advised kitchen is out delivering meals.

## 2017-06-12 NOTE — ED Notes (Addendum)
Family went home Meal tray ordered

## 2017-06-12 NOTE — ED Triage Notes (Signed)
Pt to ER for evaluation of lower chest pressure radiating into his mid abdomen and his back. States the chest pain has been off and on for 1 month, which has seen Dr. Martinique with cardiology for, was placed on midodrine for hypotension. Pt states the abdominal pain and back pain began today at 1 pm with bilateral lower extremities feeling heavy. BP in RA 120/89, LA 128/80. Pedal pulses +2 bilaterally, radials +2 bilaterally. Pt is a/o x4.

## 2017-06-12 NOTE — ED Notes (Signed)
Meal delivered

## 2017-06-12 NOTE — ED Provider Notes (Signed)
[facility] EMERGENCY DEPARTMENT Provider Note   CSN: [id number] Arrival date & time: 06/12/17  1532     History   Chief Complaint Chief Complaint  Patient presents with  . Chest Pain  . Abdominal Pain  . Back Pain    HPI Jerry Barr is a 76 y.o. male.    The history is provided by the patient and medical records. No language interpreter was used.  Chest Pain   This is a new problem. The current episode started 12 to 24 hours ago. The problem occurs constantly. The problem has not changed since onset.The pain is associated with exertion and breathing. The pain is present in the substernal region. The pain is at a severity of 8/10. The pain is severe. The quality of the pain is described as exertional, pleuritic, heavy and pressure-like. The pain radiates to the upper back and epigastrium. Duration of episode(s) is 4 hours. The symptoms are aggravated by deep breathing and exertion. Associated symptoms include abdominal pain, back pain, diaphoresis, exertional chest pressure, malaise/fatigue, nausea and near-syncope. Pertinent negatives include no cough, no fever, no headaches, no hemoptysis, no lower extremity edema, no palpitations, no shortness of breath, no syncope and no vomiting. He has tried rest for the symptoms. The treatment provided mild relief. Risk factors include male gender.  His past medical history is significant for CAD.    Past Medical History:  Diagnosis Date  . BPH (benign prostatic hyperplasia)   . Coronary artery disease    Promus drug-eluting stent to a 99% mid LAD, 40-50% narrowing in the distal LAD with mild to moderate disease in the circ and RCA  . Dyslipidemia   . ED (erectile dysfunction)   . Hyperplastic colon polyp   . Hypoglycemia     Patient Active Problem List   Diagnosis Date Noted  . New onset atrial fibrillation ([place]) 04/20/2016  . Palpitations 04/20/2016  . Opacity of lung on imaging study 04/20/2016  . Atrial  fibrillation with RVR ([place]) 04/20/2016  . Chest pain   . Dyslipidemia   . CAD (coronary artery disease), native coronary artery 10/29/2014    Past Surgical History:  Procedure Laterality Date  . CARDIOVERSION N/A 04/23/2016   Procedure: CARDIOVERSION;  Surgeon: [name], MD;  Location: [facility];  Service: Cardiovascular;  Laterality: N/A;  . CORONARY ANGIOPLASTY WITH STENT PLACEMENT    . TEE WITHOUT CARDIOVERSION N/A 04/23/2016   Procedure: TRANSESOPHAGEAL ECHOCARDIOGRAM (TEE);  Surgeon: [name], MD;  Location: [facility] ENDOSCOPY;  Service: Cardiovascular;  Laterality: N/A;       Home Medications    Prior to Admission medications   Medication Sig Start Date End Date Taking? Authorizing Provider  amiodarone (PACERONE) 100 MG tablet Take 1 tablet (100 mg total) by mouth daily. 05/06/17   Martinique, Peter M, MD  atorvastatin (LIPITOR) 10 MG tablet TAKE 1 TABLET BY MOUTH EVERY DAY 06/09/17   Martinique, Peter M, MD  Cholecalciferol (VITAMIN D-3) 1000 UNITS CAPS Take 1,000 Units by mouth daily.    [provider]  fluticasone (FLONASE) 50 MCG/ACT nasal spray Place 1 spray into both nostrils as needed for allergies.     [provider]  loratadine (CLARITIN) 10 MG tablet Take 10 mg by mouth as needed for allergies.     [provider]  midodrine (PROAMATINE) 5 MG tablet Take 1 tablet (5 mg total) 3 (three) times daily with meals by mouth. 05/24/17   Martinique, Peter M, MD  Multiple  Vitamin (MULTIVITAMIN) tablet Take 1 tablet by mouth daily.    [provider]  Na Sulfate-K Sulfate-Mg Sulf (SUPREP BOWEL PREP KIT) 17.5-3.13-1.6 GM/177ML SOLN Take 1 kit by mouth as directed. 05/10/17   Levin Erp, PA  Psyllium (METAMUCIL PO) Take 1 scoop by mouth.     [provider]  rivaroxaban (XARELTO) 20 MG TABS tablet Take 1 tablet (20 mg total) by mouth daily with supper. 12/30/16   Martinique, Peter M, MD    Family History Family History  Problem  Relation Age of Onset  . CAD Father   . Heart attack Paternal Grandmother     Social History Social History   Tobacco Use  . Smoking status: Never Smoker  . Smokeless tobacco: Never Used  Substance Use Topics  . Alcohol use: Not on file  . Drug use: No     Allergies   Penicillins   Review of Systems Review of Systems  Constitutional: Positive for diaphoresis and malaise/fatigue. Negative for chills and fever.  HENT: Negative for congestion.   Respiratory: Positive for chest tightness. Negative for cough, hemoptysis, shortness of breath, wheezing and stridor.   Cardiovascular: Positive for chest pain and near-syncope. Negative for palpitations, leg swelling and syncope.  Gastrointestinal: Positive for abdominal pain and nausea. Negative for constipation, diarrhea and vomiting.  Genitourinary: Negative for flank pain and frequency.  Musculoskeletal: Positive for back pain. Negative for neck pain and neck stiffness.  Neurological: Positive for syncope (3 weeks ago) and light-headedness. Negative for headaches.  Psychiatric/Behavioral: Negative for agitation.  All other systems reviewed and are negative.    Physical Exam Updated Vital Signs BP 128/80 (BP Location: Left Arm)   Pulse 87   Temp 97.9 F (36.6 C) (Oral)   Resp 18   SpO2 100%   Physical Exam  Constitutional: He is oriented to person, place, and time. He appears well-developed and well-nourished.  Non-toxic appearance. He does not appear ill. No distress.  HENT:  Head: Normocephalic.  Mouth/Throat: Oropharynx is clear and moist. No oropharyngeal exudate.  Eyes: Conjunctivae and EOM are normal. Pupils are equal, round, and reactive to light.  Neck: Normal range of motion.  Cardiovascular: Normal rate, regular rhythm and intact distal pulses.  Murmur heard. Pulmonary/Chest: No stridor. No respiratory distress. He has no wheezes. He has no rales. He exhibits no tenderness.  Abdominal: Soft. He exhibits no  distension. There is no tenderness.  Musculoskeletal: He exhibits no tenderness.  Neurological: He is alert and oriented to person, place, and time. No cranial nerve deficit or sensory deficit. He exhibits normal muscle tone.  Skin: Capillary refill takes less than 2 seconds. He is not diaphoretic. No erythema. No pallor.  Psychiatric: He has a normal mood and affect.  Nursing note and vitals reviewed.    ED Treatments / Results  Labs (all labs ordered are listed, but only abnormal results are displayed) Labs Reviewed  BASIC METABOLIC PANEL - Abnormal; Notable for the following components:      Result Value   BUN 23 (*)    GFR calc non Af Amer 58 (*)    All other components within normal limits  CBC  TROPONIN I  HEMOGLOBIN A1C  TROPONIN I  TROPONIN I  COMPREHENSIVE METABOLIC PANEL  CBC  I-STAT TROPONIN, ED    EKG  EKG Interpretation  Date/Time:  Sunday June 12 2017 15:37:13 EST Ventricular Rate:  81 PR Interval:  174 QRS Duration: 86 QT Interval:  388 QTC Calculation: 450  R Axis:   64 Text Interpretation:  Normal sinus rhythm Nonspecific ST abnormality Abnormal ECG When compared to prior, now sinus rhythm.  No STEMI Confirmed by Antony Blackbird [id number]) on 06/12/2017 3:55:35 PM       Radiology Dg Chest 2 View  Result Date: 06/12/2017 CLINICAL DATA:  Lower chest pressure. EXAM: CHEST  2 VIEW COMPARISON:  May 24, 2016 FINDINGS: The heart size and mediastinal contours are within normal limits. Both lungs are clear. The visualized skeletal structures are unremarkable. IMPRESSION: No active cardiopulmonary disease. Electronically Signed   By: Dorise Bullion III M.D   On: 06/12/2017 16:38    Procedures Procedures (including critical care time)  Medications Ordered in ED Medications  loratadine (CLARITIN) tablet 10 mg (not administered)  Vitamin D-3 CAPS 1,000 Units (not administered)  fluticasone (FLONASE) 50 MCG/ACT nasal spray 1 spray (not administered)    rivaroxaban (XARELTO) tablet 20 mg (not administered)  amiodarone (PACERONE) tablet 100 mg (not administered)  midodrine (PROAMATINE) tablet 5 mg (not administered)  atorvastatin (LIPITOR) tablet 10 mg (not administered)  acetaminophen (TYLENOL) tablet 500 mg (not administered)     Initial Impression / Assessment and Plan / ED Course  I have reviewed the triage vital signs and the nursing notes.  Pertinent labs & imaging results that were available during my care of the patient were reviewed by me and considered in my medical decision making (see chart for details).     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  Patient reports that he has had indigestion for the last week.  He says that his indigestion changed into chest pain this afternoon.  He reports that it is pleuritic and it is exertional.  He reports nausea and diaphoresis.  He reports extreme lightheadedness and near syncopal episodes.  He described chest pressure radiating towards his back and epigastrium that was an 8 out of 10 severity.  He reports that it is currently improved to around a 3 out of 10.  He says that his blood pressures have been low for the last month intermittently as low as the 70s in the mornings.  He reports that he has to rest and drink fluids with his feet up in order to get going in the mornings.  He reports that he has had no leg pain or leg swelling.  No history of DVT or PE.  He reports that he had a syncopal episode 3 weeks ago but has not had any other syncopal episodes.  He denies recent trauma or other complaints.  Patient says that the last time he had severe indigestion it was when he had to have his PCI of the LAD with a 99% occlusion.  He reports that he occasionally gets very mild indigestion but this feels like his cardiac  symptoms.  He reports that he did not have chest pain during his initial cardiac event 7 years ago.  On exam, legs are nontender and nonswollen.  Patient has palpable pulses in DP and PT arteries.  Abdomen is nontender.  Chest is nontender and his discomfort was not reproducible.  Lungs were clear.  No focal neurologic deficits.  Patient is alert and oriented and in no distress.  No diaphoresis seen.  Initial EKG showed no STEMI.  Patient is in sinus rhythm.  Patient will have screening laboratory testing and  x-ray.  Initial troponin negative.  Metabolic panel and CBC are reassuring.  Chest x-ray unremarkable.    Cardiology be called for management recommendations and likely admission for high risk chest pain.  Cardiology will admit the patient for further management.     Final Clinical Impressions(s) / ED Diagnoses   Final diagnoses:  Precordial pain    Clinical Impression: 1. Precordial pain     Disposition: Admit to cardiology     Chenell Lozon, Gwenyth Allegra, MD 06/13/17 [phone]

## 2017-06-13 ENCOUNTER — Observation Stay (HOSPITAL_BASED_OUTPATIENT_CLINIC_OR_DEPARTMENT_OTHER): Payer: Medicare Other

## 2017-06-13 ENCOUNTER — Other Ambulatory Visit: Payer: Self-pay

## 2017-06-13 DIAGNOSIS — R079 Chest pain, unspecified: Secondary | ICD-10-CM | POA: Diagnosis not present

## 2017-06-13 DIAGNOSIS — I25119 Atherosclerotic heart disease of native coronary artery with unspecified angina pectoris: Secondary | ICD-10-CM | POA: Diagnosis not present

## 2017-06-13 DIAGNOSIS — R072 Precordial pain: Secondary | ICD-10-CM | POA: Diagnosis not present

## 2017-06-13 DIAGNOSIS — I951 Orthostatic hypotension: Secondary | ICD-10-CM | POA: Diagnosis not present

## 2017-06-13 DIAGNOSIS — I480 Paroxysmal atrial fibrillation: Secondary | ICD-10-CM | POA: Diagnosis not present

## 2017-06-13 LAB — CBC
HCT: 41.9 % (ref 39.0–52.0)
Hemoglobin: 14.1 g/dL (ref 13.0–17.0)
MCH: 30.9 pg (ref 26.0–34.0)
MCHC: 33.7 g/dL (ref 30.0–36.0)
MCV: 91.9 fL (ref 78.0–100.0)
Platelets: 242 10*3/uL (ref 150–400)
RBC: 4.56 MIL/uL (ref 4.22–5.81)
RDW: 14.2 % (ref 11.5–15.5)
WBC: 6.5 10*3/uL (ref 4.0–10.5)

## 2017-06-13 LAB — NM MYOCAR MULTI W/SPECT W/WALL MOTION / EF
CHL CUP MPHR: 145 {beats}/min
CHL CUP RESTING HR STRESS: 63 {beats}/min
Estimated workload: 10.1 METS
Exercise duration (min): 9 min
Exercise duration (sec): 18 s
Peak HR: 126 {beats}/min
Percent HR: 86 %

## 2017-06-13 LAB — COMPREHENSIVE METABOLIC PANEL
ALBUMIN: 3.5 g/dL (ref 3.5–5.0)
ALT: 18 U/L (ref 17–63)
ANION GAP: 9 (ref 5–15)
AST: 20 U/L (ref 15–41)
Alkaline Phosphatase: 48 U/L (ref 38–126)
BILIRUBIN TOTAL: 0.8 mg/dL (ref 0.3–1.2)
BUN: 21 mg/dL — AB (ref 6–20)
CALCIUM: 8.7 mg/dL — AB (ref 8.9–10.3)
CHLORIDE: 106 mmol/L (ref 101–111)
CO2: 24 mmol/L (ref 22–32)
Creatinine, Ser: 1.05 mg/dL (ref 0.61–1.24)
GFR calc Af Amer: >60 mL/min (ref >60)
GLUCOSE: 86 mg/dL (ref 65–99)
POTASSIUM: 4.1 mmol/L (ref 3.5–5.1)
Sodium: 139 mmol/L (ref 135–145)
TOTAL PROTEIN: 6.2 g/dL — AB (ref 6.5–8.1)

## 2017-06-13 LAB — ECHOCARDIOGRAM COMPLETE

## 2017-06-13 LAB — TROPONIN I: Troponin I: <0.03 ng/mL (ref <0.03)

## 2017-06-13 MED ORDER — MIDODRINE HCL 5 MG PO TABS: 10.0000 mg | ORAL_TABLET | Freq: Three times a day (TID) | ORAL | 11 refills | Status: DC

## 2017-06-13 MED ORDER — TECHNETIUM TC 99M TETROFOSMIN IV KIT
10.0000 | PACK | Freq: Once | INTRAVENOUS | Status: AC | PRN
  Administered 2017-06-13: 10 via INTRAVENOUS

## 2017-06-13 MED ORDER — MIDODRINE HCL 5 MG PO TABS
10.0000 mg | ORAL_TABLET | Freq: Three times a day (TID) | ORAL | Status: DC
Start: 2017-06-13 — End: 2017-06-13
  Administered 2017-06-13 (×2): 10 mg via ORAL
  Filled 2017-06-13 (×4): qty 2

## 2017-06-13 MED ORDER — TECHNETIUM TC 99M TETROFOSMIN IV KIT
30.0000 | PACK | Freq: Once | INTRAVENOUS | Status: AC | PRN
  Administered 2017-06-13: 30 via INTRAVENOUS

## 2017-06-13 NOTE — ED Notes (Signed)
Cardiologist at bedside.  

## 2017-06-13 NOTE — Discharge Summary (Signed)
Discharge Summary    Patient ID: Jerry Barr,  MRN: [id number], DOB/AGE: 1941/02/06 76 y.o.  Admit date: 06/12/2017 Discharge date: 06/13/2017  Primary Care Provider: Antony Barr Primary Cardiologist: Jerry Barr  Discharge Diagnoses    Principal Problem:   Chest pain Active Problems:   CAD (coronary artery disease), native coronary artery   Dyslipidemia   Paroxysmal atrial fibrillation (HCC)   Orthostatic hypotension   Allergies Allergies  Allergen Reactions  . Penicillins Rash    Pt has had other "cillins" since minor reaction 50 years ago with no reaction. Has patient had a PCN reaction causing immediate rash, facial/tongue/throat swelling, SOB or lightheadedness with hypotension: Yes Has patient had a PCN reaction causing severe rash involving mucus membranes or skin necrosis: No Has patient had a PCN reaction that required hospitalization: No Has patient had a PCN reaction occurring within the last 10 years: No If all of the above answers are "NO", then may proceed with     Diagnostic Studies/Procedures    Exercise myoview: 06/13/17   Blood pressure demonstrated a normal response to exercise.  Clinically negative, electrically positive with EKG changes in Stage III. Good exercise tolerance  Inferior, inferolateral , inferoseptal thinning with no significant change in the recovery images consistent with probable soft tissue attenuation (diaphragm) No significant ischemia or scar.  Nuclear stress EF: 55%.  Overall low risk scan _____________   History of Present Illness     76 y.o. old male with medical history noted below presents with acute complaints of chest pressure that occurred while at rest earlier the day of admission. He has a CAD history with a stent in the LAD back in 2011.  At that time he developed what he described as indigestion while jogging that was relived with rest.  He had been having periods of hypotension which has been treated with  midodrine.  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  He stated all morning he didn't feel well, however, he continued on with going to church.  After coming back home, he stated he was sitting and suddenly felt 8/10 pressure in the middle of his chest which radiated to his back.  At this point he decided to come to the ED for evaluation. He denied periods of dyspnea, lightheadedness, dizziness, or syncope. Troponin was negative and ECG is unremarkable.    Hospital Course     He ruled out for MI. Underwent exercise myoview that was low risk, and with follow up echo reporting EF 45-50%. Noted to maintain SR on amiodarone and was restarted on Xarelto. Did report ongoing episodes of orthostatic hypotension and his midodrine was increased to '10mg'$  TID at discharge.   Jerry Barr was seen by Dr. Martinique and determined stable for discharge home. Follow up in the office has been arranged. Medications are listed below.   _____________  Discharge Vitals Blood pressure 108/75, pulse 73, temperature 97.7 F (36.5 C), temperature source Oral, resp. rate 19, height 6' 0.01" (1.829 Barr), weight 153 lb (69.4 kg), SpO2 99 %.  Filed Weights   06/13/17 1530  Weight: 153 lb (69.4 kg)    Labs & Radiologic Studies    CBC Recent Labs    06/12/17 1542 06/13/17 0453  WBC 8.4 6.5  HGB 14.6 14.1  HCT 43.9 41.9  MCV 92.4 91.9  PLT 255  751   Basic Metabolic Panel Recent Labs    06/12/17 1542 06/13/17 0453  NA 135 139  K 4.2 4.1  CL 101 106  CO2 26 24  GLUCOSE 91 86  BUN 23* 21*  CREATININE 1.19 1.05  CALCIUM 9.0 8.7*   Liver Function Tests Recent Labs    06/13/17 0453  AST 20  ALT 18  ALKPHOS 48  BILITOT 0.8  PROT 6.2*  ALBUMIN 3.5   No results for input(s): LIPASE, AMYLASE in the last 72 hours. Cardiac Enzymes Recent Labs    06/12/17 2243 06/13/17 0453  06/13/17 1224  TROPONINI <0.03 <0.03 <0.03   BNP Invalid input(s): POCBNP D-Dimer No results for input(s): DDIMER in the last 72 hours. Hemoglobin A1C No results for input(s): HGBA1C in the last 72 hours. Fasting Lipid Panel No results for input(s): CHOL, HDL, LDLCALC, TRIG, CHOLHDL, LDLDIRECT in the last 72 hours. Thyroid Function Tests No results for input(s): TSH, T4TOTAL, T3FREE, THYROIDAB in the last 72 hours.  Invalid input(s): FREET3 _____________  Dg Chest 2 View  Result Date: 06/12/2017 CLINICAL DATA:  Lower chest pressure. EXAM: CHEST  2 VIEW COMPARISON:  May 24, 2016 FINDINGS: The heart size and mediastinal contours are within normal limits. Both lungs are clear. The visualized skeletal structures are unremarkable. IMPRESSION: No active cardiopulmonary disease. Electronically Signed   By: Dorise Bullion III Barr.D   On: 06/12/2017 16:38   Nm Myocar Multi W/spect Tamela Oddi Motion / Ef  Result Date: 06/13/2017  Blood pressure demonstrated a normal response to exercise.  Clinically negative, electrically positive with EKG changes in Stage III. Good exercise tolerance  Inferior, inferolateral , inferoseptal thinning with no significant change in the recovery images consistent with probable soft tissue attenuation (diaphragm) No significant ischemia or scar.  Nuclear stress EF: 55%.  Overall low risk scan    Disposition   Pt is being discharged home today in good condition.  Follow-up Plans & Appointments    Follow-up Information    Jerry Barr, Jerry M, MD Follow up.   Specialty:  Cardiology Why:  Office will call you with a follow up appt. Contact information: [address] [place] [address] [phone]          Discharge Instructions    Diet - low sodium heart healthy   Complete by:  As directed    Increase activity slowly   Complete by:  As directed       Discharge Medications     Medication List    STOP taking these medications     Na Sulfate-K Sulfate-Mg Sulf 17.5-3.13-1.6 GM/177ML Soln Commonly known as:  SUPREP BOWEL PREP KIT     TAKE these medications   acetaminophen 500 MG tablet Commonly known as:  TYLENOL Take 500 mg by mouth every 6 (six) hours as needed for headache (pain).   amiodarone 100 MG tablet Commonly known as:  PACERONE Take 1 tablet (100 mg total) by mouth daily.   atorvastatin 10 MG tablet Commonly known as:  LIPITOR TAKE 1 TABLET BY MOUTH EVERY DAY What changed:    how much to take  how to take this  when to take this   fluticasone 50 MCG/ACT nasal spray Commonly known as:  FLONASE Place 1 spray into both nostrils daily as needed (seasonal allergies).   loratadine 10 MG tablet Commonly known as:  CLARITIN Take 10 mg by mouth daily.   METAMUCIL PO Take 30 mLs by mouth daily after lunch. Mix in  liquid and drink   midodrine 5 MG tablet Commonly known as:  PROAMATINE Take 2 tablets (10 mg total) by mouth 3 (three) times daily with meals. What changed:  how much to take   rivaroxaban 20 MG Tabs tablet Commonly known as:  XARELTO Take 1 tablet (20 mg total) by mouth daily with supper. What changed:  when to take this   Vitamin D-3 1000 units Caps Take 1,000 Units by mouth daily.         Outstanding Labs/Studies   N/a   Duration of Discharge Encounter   Greater than 30 minutes including physician time.  Signed, Reino Bellis NP-C 06/13/2017, 4:54 PM

## 2017-06-13 NOTE — ED Notes (Signed)
Pt ambulated to restroom with assistance, upon attempting to ambulated back to room pt had onset of dizziness which he states is normal for him in the AM, pt assisted onto steady transporter and safely helped back to stretcher.

## 2017-06-13 NOTE — ED Notes (Signed)
Cardiologist advised pt may have water at this time, stress test will be scheduled for later today.

## 2017-06-13 NOTE — ED Notes (Signed)
Pt going for stress test at this time  

## 2017-06-13 NOTE — Progress Notes (Signed)
Progress Note  Patient Name: Jerry Barr Date of Encounter: 06/13/2017  Primary Cardiologist: Lieutenant Abarca Martinique MD  Subjective   Patient feels OK this am. Denies any chest pain/pressure/dyspnea  Inpatient Medications    Scheduled Meds: . amiodarone  100 mg Oral Daily  . atorvastatin  10 mg Oral Daily  . cholecalciferol  1,000 Units Oral Daily  . loratadine  10 mg Oral Daily  . midodrine  10 mg Oral TID WC   Continuous Infusions:  PRN Meds: acetaminophen, fluticasone   Vital Signs    Vitals:   06/13/17 0700 06/13/17 0715 06/13/17 0730 06/13/17 0745  BP: 113/72 95/67 109/71 108/75  Pulse: 61 77 63 65  Resp: 12 13 14 17   Temp:      TempSrc:      SpO2: 96% 97% 96% 96%   No intake or output data in the 24 hours ending 06/13/17 0812 There were no vitals filed for this visit.  Telemetry    NSR - Personally Reviewed  ECG     NSR,minor nonspecific ST abnormality- Personally Reviewed  Physical Exam   GEN: No acute distress.  Thin WM.  Neck: No JVD Cardiac: RRR, no murmurs, rubs, or gallops.  Respiratory: Clear to auscultation bilaterally. GI: Soft, nontender, non-distended  MS: No edema; No deformity. Neuro:  Nonfocal  Psych: Normal affect   Labs    Chemistry Recent Labs  Lab 06/12/17 1542 06/13/17 0453  NA 135 139  K 4.2 4.1  CL 101 106  CO2 26 24  GLUCOSE 91 86  BUN 23* 21*  CREATININE 1.19 1.05  CALCIUM 9.0 8.7*  PROT  --  6.2*  ALBUMIN  --  3.5  AST  --  20  ALT  --  18  ALKPHOS  --  48  BILITOT  --  0.8  GFRNONAA 58* >60  GFRAA >60 >60  ANIONGAP 8 9     Hematology Recent Labs  Lab 06/12/17 1542 06/13/17 0453  WBC 8.4 6.5  RBC 4.75 4.56  HGB 14.6 14.1  HCT 43.9 41.9  MCV 92.4 91.9  MCH 30.7 30.9  MCHC 33.3 33.7  RDW 13.9 14.2  PLT 255 242    Cardiac Enzymes Recent Labs  Lab 06/12/17 2243 06/13/17 0453  TROPONINI <0.03 <0.03    Recent Labs  Lab 06/12/17 1602  TROPIPOC 0.00     BNPNo results for input(s): BNP,  PROBNP in the last 168 hours.   DDimer No results for input(s): DDIMER in the last 168 hours.   Radiology    Dg Chest 2 View  Result Date: 06/12/2017 CLINICAL DATA:  Lower chest pressure. EXAM: CHEST  2 VIEW COMPARISON:  May 24, 2016 FINDINGS: The heart size and mediastinal contours are within normal limits. Both lungs are clear. The visualized skeletal structures are unremarkable. IMPRESSION: No active cardiopulmonary disease. Electronically Signed   By: Dorise Bullion III M.D   On: 06/12/2017 16:38    Cardiac Studies   Echo 04/23/16:Study Conclusions  - Left ventricle: Systolic function was mildly reduced. The   estimated ejection fraction was in the range of 45% to 50%.   Diffuse hypokinesis. - Aortic valve: No evidence of vegetation. - Mitral valve: No evidence of vegetation. There was mild to   moderate regurgitation. - Left atrium: No evidence of thrombus in the appendage. There was   spontaneous echo contrast (&quot;smoke&quot;). - Right atrium: No evidence of thrombus in the atrial cavity or   appendage. - Atrial septum: No  defect or patent foramen ovale was identified.   Echo contrast study showed no right-to-left atrial level shunt,   following an increase in RA pressure induced by provocative   maneuvers. - Tricuspid valve: No evidence of vegetation. - Pulmonic valve: No evidence of vegetation. - Superior vena cava: The study excluded a thrombus.  Patient Profile     76 y.o. male with history of CAD s/p stenting of the LAD in 2011 presents with symptoms of chest pain at rest. Also history of dyslipidemia and PAFib on amiodarone and Xarelto. Recently started on midodrine for significant orthostatic hypotension.  Assessment & Plan    1. Chest pain. Ruled out for MI and chest pain resolved. Last Myoview in 2016 was normal. Will keep NPO this am. Plan stress Myoview today. If negative can DC home later today. If abnormal will need to consider cardiac cath. Will  hold Xarelto just in case- last dose yesterday am. Will also update Echo. 2. PAfib. Maintaining NSR on amiodarone.  3. Dyslipidemia on statin 4. Orthostatic hypotension. Initially reported improved symptoms on midodrine but BP has trended back down and he still has considerable difficulty in am. Notes increased urinary frequency on midodrine. Will increase dose to 10 mg tid. If symptoms do not improve or if side effects become intolerable will consider Florinef.  For questions or updates, please contact [place] Please consult [email or web address] for contact info under Cardiology/STEMI.      Signed, Chananya Canizalez Martinique, MD  06/13/2017, 8:12 AM

## 2017-06-13 NOTE — ED Notes (Signed)
Pt at stress test

## 2017-06-13 NOTE — Progress Notes (Signed)
  Echocardiogram 2D Echocardiogram has been performed.  Jerry Barr 06/13/2017, 3:04 PM

## 2017-06-13 NOTE — Progress Notes (Signed)
   Jerry Barr presented for a 1-day Treadmill cardiolite today.  No immediate complications.  Stress imaging is pending at this time.  Erma Heritage, PA-C 06/13/2017, 11:18 AM

## 2017-06-13 NOTE — Progress Notes (Signed)
Discharge instruction was given to pt and spouse.  Belongings were sent home with pt and family.  Idolina Primer, RN

## 2017-06-13 NOTE — ED Notes (Signed)
[facility] advised they are sending for patient at this time

## 2017-06-13 NOTE — ED Notes (Signed)
Pt's lunch tray arrived. 

## 2017-06-14 LAB — HEMOGLOBIN A1C
Hgb A1c MFr Bld: 5.2 % (ref 4.8–5.6)
Mean Plasma Glucose: 103 mg/dL

## 2017-06-15 ENCOUNTER — Encounter: Payer: Self-pay | Admitting: Gastroenterology

## 2017-06-16 ENCOUNTER — Encounter: Payer: Medicare Other | Admitting: Gastroenterology

## 2017-06-21 ENCOUNTER — Ambulatory Visit: Payer: Medicare Other | Admitting: Cardiology

## 2017-07-07 ENCOUNTER — Encounter: Payer: Self-pay | Admitting: *Deleted

## 2017-07-15 ENCOUNTER — Encounter: Payer: Self-pay | Admitting: Physician Assistant

## 2017-07-15 ENCOUNTER — Ambulatory Visit (INDEPENDENT_AMBULATORY_CARE_PROVIDER_SITE_OTHER): Payer: Medicare Other | Admitting: Physician Assistant

## 2017-07-15 VITALS — BP 134/83 | HR 69 | Ht 72.0 in | Wt 158.0 lb

## 2017-07-15 DIAGNOSIS — I2510 Atherosclerotic heart disease of native coronary artery without angina pectoris: Secondary | ICD-10-CM

## 2017-07-15 DIAGNOSIS — E785 Hyperlipidemia, unspecified: Secondary | ICD-10-CM | POA: Diagnosis not present

## 2017-07-15 DIAGNOSIS — I951 Orthostatic hypotension: Secondary | ICD-10-CM

## 2017-07-15 DIAGNOSIS — I480 Paroxysmal atrial fibrillation: Secondary | ICD-10-CM | POA: Diagnosis not present

## 2017-07-15 NOTE — Patient Instructions (Signed)
Medication Instructions:  Continue current medications  If you need a refill on your cardiac medications before your next appointment, please call your pharmacy.  Labwork: None Ordered   Testing/Procedures: None Ordered  Follow-Up: Your physician wants you to follow-up in: 6 Months with Dr Martinique. You should receive a reminder letter in the mail two months in advance. If you do not receive a letter, please call our office [phone].    Thank you for choosing [facility] at [facility]!!

## 2017-07-15 NOTE — Progress Notes (Signed)
Cardiology Office Note    Date:  07/17/2017   ID:  Jerry Barr, DOB 11/30/1940, MRN [id number]  PCP:  Antony Contras, MD  Cardiologist:  Dr. Martinique   Chief Complaint  Patient presents with  . Follow-up    hospital follow up from Afib;    History of Present Illness:  Jerry Barr is a 77 y.o. male with PMH of BPH, HLD, chronic hypotension, PAF on Xarelto and CAD s/p DES to LAD in 2011. Patient was admitted to [facility] on 04/20/2016 for evaluation of palpitation, fatigue and chest pain and found to have new atrial fibrillation. Prior to this admission, he did have a nuclear stress test in April 2016 that showed no evidence of ischemia. Due to difficulty controlling her heart rate, he was placed on Xarelto and amiodarone. Echocardiogram obtained on 04/22/2016 showed EF 50-55%, no regional wall motion abnormality, mild MR. He eventually underwent successful TEE cardioversion on 04/23/2016.  Patient was seen by Dr. Martinique in November 2018 for orthostatic hypotension.  He was instructed to taking more salt and had Midrin 5 mg 3 times daily added to his medical regimen.  Recently was admitted to the hospital on 06/13/2017 was chest pain.  He was ruled out of MI by cardiac enzymes.  During the recent admission, Midrin was increased to 10 mg 3 times daily.  He underwent Myoview on 07/10/2017 which showed EF 55%, no significant ischemia or scar.  Overall considered low risk study.  Echocardiogram obtained on 06/12/2017 showed EF 45-50%, mild diffuse hypokinesis, trivial AI, PA peak pressure 24 mmHg.  Patient presents today for cardiology office visit.  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  The only time he has dizziness is during the first hour after waking up in the morning.  Otherwise his systolic blood pressure seems to be very well controlled on the Midrin.  He is able to get around and perform his daily activities without any further issues after recovering from waking up.  I will  continue him on the current dose of Midrin.  If his symptom worsens, we can also was consider addition of fludrocortisone.    Past Medical History:  Diagnosis Date  . BPH (benign prostatic hyperplasia)   . Coronary artery disease    Promus drug-eluting stent to a 99% mid LAD, 40-50% narrowing in the distal LAD with mild to moderate disease in the circ and RCA  . Dyslipidemia   . ED (erectile dysfunction)   . Hyperplastic colon polyp   . Hypoglycemia     Past Surgical History:  Procedure Laterality Date  . CARDIOVERSION N/A 04/23/2016   Procedure: CARDIOVERSION;  Surgeon: [name], MD;  Location: [place];  Service: Cardiovascular;  Laterality: N/A;  . CORONARY ANGIOPLASTY WITH STENT PLACEMENT    . TEE WITHOUT CARDIOVERSION N/A 04/23/2016   Procedure: TRANSESOPHAGEAL ECHOCARDIOGRAM (TEE);  Surgeon: [name], MD;  Location: [facility] ENDOSCOPY;  Service: Cardiovascular;  Laterality: N/A;    Current Medications: Outpatient Medications Prior to Visit  Medication Sig Dispense Refill  . acetaminophen (TYLENOL) 500 MG tablet Take 500 mg by mouth every 6 (six) hours as needed for headache (pain).    Marland Kitchen amiodarone (PACERONE) 100 MG tablet Take 1 tablet (100 mg total) by mouth daily. 90 tablet 3  . atorvastatin (LIPITOR) 10 MG tablet TAKE 1 TABLET BY MOUTH EVERY DAY (Patient taking differently: TAKE 1 TABLET (10 MG) BY MOUTH EVERY MORNING) 90 tablet 1  . Cholecalciferol (VITAMIN D-3)  1000 UNITS CAPS Take 1,000 Units by mouth daily.    . fluticasone (FLONASE) 50 MCG/ACT nasal spray Place 1 spray into both nostrils daily as needed (seasonal allergies).     . loratadine (CLARITIN) 10 MG tablet Take 10 mg by mouth daily.     . midodrine (PROAMATINE) 5 MG tablet Take 2 tablets (10 mg total) by mouth 3 (three) times daily with meals. 90 tablet 11  . Psyllium (METAMUCIL PO) Take 30 mLs by mouth daily after lunch. Mix in liquid and drink    . rivaroxaban (XARELTO) 20 MG TABS tablet Take 1  tablet (20 mg total) by mouth daily with supper. (Patient taking differently: Take 20 mg by mouth daily with breakfast. ) 90 tablet 1   No facility-administered medications prior to visit.      Allergies:   Penicillins   Social History   Socioeconomic History  . Marital status: Married    Spouse name: None  . Number of children: None  . Years of education: None  . Highest education level: None  Social Needs  . Financial resource strain: None  . Food insecurity - worry: None  . Food insecurity - inability: None  . Transportation needs - medical: None  . Transportation needs - non-medical: None  Occupational History  . None  Tobacco Use  . Smoking status: Never Smoker  . Smokeless tobacco: Never Used  Substance and Sexual Activity  . Alcohol use: None  . Drug use: No  . Sexual activity: None  Other Topics Concern  . None  Social History Narrative  . None     Family History:  The patient's family history includes CAD in his father; Heart attack in his paternal grandmother.   ROS:   Please see the history of present illness.    ROS All other systems reviewed and are negative.   PHYSICAL EXAM:   VS:  BP 134/83   Pulse 69   Ht 6' (1.829 m)   Wt 158 lb (71.7 kg)   BMI 21.43 kg/m    GEN: Well nourished, well developed, in no acute distress  HEENT: normal  Neck: no JVD, carotid bruits, or masses Cardiac: RRR; no murmurs, rubs, or gallops,no edema  Respiratory:  clear to auscultation bilaterally, normal work of breathing GI: soft, nontender, nondistended, + BS MS: no deformity or atrophy  Skin: warm and dry, no rash Neuro:  Alert and Oriented x 3, Strength and sensation are intact Psych: euthymic mood, full affect  Wt Readings from Last 3 Encounters:  07/15/17 158 lb (71.7 kg)  06/13/17 153 lb (69.4 kg)  05/24/17 162 lb 6.4 oz (73.7 kg)      Studies/Labs Reviewed:   EKG:  EKG is not ordered today.   Recent Labs: 12/08/2016: TSH 1.28 06/13/2017: ALT 18;  BUN 21; Creatinine, Ser 1.05; Hemoglobin 14.1; Platelets 242; Potassium 4.1; Sodium 139   Lipid Panel    Component Value Date/Time   CHOL 131 06/29/2016 1011   TRIG 42 06/29/2016 1011   HDL 43 06/29/2016 1011   CHOLHDL 3.0 06/29/2016 1011   VLDL 8 06/29/2016 1011   LDLCALC 80 06/29/2016 1011    Additional studies/ records that were reviewed today include:   Echo 04/22/2016 LV EF: 50% -   55%  Study Conclusions  - Left ventricle: The cavity size was normal. Wall thickness was   normal. Systolic function was normal. The estimated ejection   fraction was in the range of 50% to 55%. Wall  motion was normal;   there were no regional wall motion abnormalities. - Mitral valve: Calcified annulus. There was mild regurgitation. - Left atrium: The atrium was moderately dilated. - Right atrium: The atrium was mildly dilated. - Pericardium, extracardiac: A trivial pericardial effusion was   identified.  Impressions:  - Low normal LV systolic function; mild MR; biatrial enlargement;   mild TR.    Echo 06/13/2017 LV EF: 45% -   50%  Study Conclusions  - Left ventricle: The cavity size was normal. Systolic function was   mildly reduced. The estimated ejection fraction was in the range   of 45% to 50%. Mild diffuse hypokinesis. Left ventricular   diastolic function parameters were normal. - Aortic valve: Transvalvular velocity was within the normal range.   There was no stenosis. There was trivial regurgitation. - Mitral valve: There was trivial regurgitation. - Left atrium: The atrium was moderately dilated. - Right ventricle: The cavity size was normal. Wall thickness was   normal. Systolic function was normal. - Right atrium: The atrium was severely dilated. - Atrial septum: No defect or patent foramen ovale was identified   by color flow Doppler. - Tricuspid valve: There was trivial regurgitation. - Pulmonary arteries: Systolic pressure was within the normal   range. PA  peak pressure: 24 mm Hg (S).   Myoview 06/13/2017  Blood pressure demonstrated a normal response to exercise.  Clinically negative, electrically positive with EKG changes in Stage III. Good exercise tolerance  Inferior, inferolateral , inferoseptal thinning with no significant change in the recovery images consistent with probable soft tissue attenuation (diaphragm) No significant ischemia or scar.  Nuclear stress EF: 55%.  Overall low risk scan   ASSESSMENT:    1. Orthostatic hypotension   2. Hyperlipidemia, unspecified hyperlipidemia type   3. PAF (paroxysmal atrial fibrillation) ([place])   4. Coronary artery disease involving native coronary artery of native heart without angina pectoris      PLAN:  In order of problems listed above:  1. Orthostatic hypotension: Currently on Midrin 10 mg 3 times daily.  Blood pressure stable, although he does have some difficulty in the first hour after waking up, he is able to perform his everyday activity without any issue.  2. PAF on amiodarone and Xarelto: maintaining sinus rhythm, heart rate very well controlled  3. CAD: Denies any chest pain.  Not on aspirin due to the need for Xarelto.    4. Hyperlipidemia: On Lipitor 10 mg daily.    Medication Adjustments/Labs and Tests Ordered: Current medicines are reviewed at length with the patient today.  Concerns regarding medicines are outlined above.  Medication changes, Labs and Tests ordered today are listed in the Patient Instructions below. Patient Instructions  Medication Instructions:  Continue current medications  If you need a refill on your cardiac medications before your next appointment, please call your pharmacy.  Labwork: None Ordered   Testing/Procedures: None Ordered  Follow-Up: Your physician wants you to follow-up in: 6 Months with Dr Martinique. You should receive a reminder letter in the mail two months in advance. If you do not receive a letter, please call our  office [phone].    Thank you for choosing [facility] at [facility], Almyra Def[place], [place]  07/17/2017 1:16 PM    [address], [place], [place]  [address] Phone: [phone]; Fax: [phone]

## 2017-07-17 ENCOUNTER — Encounter: Payer: Self-pay | Admitting: Physician Assistant

## 2017-07-21 DIAGNOSIS — H5203 Hypermetropia, bilateral: Secondary | ICD-10-CM | POA: Diagnosis not present

## 2017-07-21 DIAGNOSIS — H2513 Age-related nuclear cataract, bilateral: Secondary | ICD-10-CM | POA: Diagnosis not present

## 2017-07-21 DIAGNOSIS — H524 Presbyopia: Secondary | ICD-10-CM | POA: Diagnosis not present

## 2017-08-01 DIAGNOSIS — C44311 Basal cell carcinoma of skin of nose: Secondary | ICD-10-CM | POA: Diagnosis not present

## 2017-08-17 ENCOUNTER — Other Ambulatory Visit: Payer: Self-pay | Admitting: Cardiology

## 2017-08-18 ENCOUNTER — Encounter: Payer: Self-pay | Admitting: Physician Assistant

## 2017-08-18 ENCOUNTER — Ambulatory Visit (INDEPENDENT_AMBULATORY_CARE_PROVIDER_SITE_OTHER): Payer: Medicare Other | Admitting: Physician Assistant

## 2017-08-18 ENCOUNTER — Encounter (INDEPENDENT_AMBULATORY_CARE_PROVIDER_SITE_OTHER): Payer: Self-pay

## 2017-08-18 VITALS — BP 90/60 | HR 58 | Ht 72.0 in | Wt 157.0 lb

## 2017-08-18 DIAGNOSIS — Z800 Family history of malignant neoplasm of digestive organs: Secondary | ICD-10-CM | POA: Diagnosis not present

## 2017-08-18 DIAGNOSIS — I2510 Atherosclerotic heart disease of native coronary artery without angina pectoris: Secondary | ICD-10-CM

## 2017-08-18 DIAGNOSIS — Z86010 Personal history of colonic polyps: Secondary | ICD-10-CM | POA: Diagnosis not present

## 2017-08-18 DIAGNOSIS — Z7901 Long term (current) use of anticoagulants: Secondary | ICD-10-CM

## 2017-08-18 DIAGNOSIS — K625 Hemorrhage of anus and rectum: Secondary | ICD-10-CM | POA: Diagnosis not present

## 2017-08-18 NOTE — Progress Notes (Signed)
Chief Complaint: History of tubular adenomas, rectal bleeding  HPI:    Jerry Barr is a 77 year old Caucasian male with a past medical history as listed below including A. fib maintained on Xarelto for the past year (last EF 04/22/16 50-55%), who presents to clinic today for consult of a colonoscopy for his history of tubular adenomas and a complaint of recent rectal bleeding.    Please recall patient was recently seen in clinic 05/10/17 and was scheduled for a surveillance colonoscopy due to his history of tubular adenomas, the last 05/17/12 in [place].  At that time he was advised to hold his Xarelto for 2 days prior to time of his procedure.  In the interim patient had trouble with low blood pressure and was hospitalized with some chest pain/pressure around the time that he was supposed to have his colonoscopy.  This was rescheduled, but when trying to schedule it next the patient did describe some recent rectal bleeding.    Today, the patient tells me that when he called to reschedule his colonoscopy he described that he had had a little bit of bright red blood on the toilet paper after wiping.  Patient tells me this was about 2 or 3 weeks ago and only happened on one occasion and was a very small amount.  He also tells me he felt a hemorrhoid at that time which is irritated and has been applying some Preparation H cream since that time.  He has seen no further bleeding.    Patient tells me his blood pressure is better under control after his Midodrine has been increased.  He has been doing well.  He had a recent heart workup including a stress test which was normal and the "doctors were happy with it".  He would like to continue scheduling his colonoscopy.    Patient denies fever, chills, weight loss, anorexia, nausea or vomiting.  Past Medical History:  Diagnosis Date  . BPH (benign prostatic hyperplasia)   . Coronary artery disease    Promus drug-eluting stent to a 99% mid LAD, 40-50%  narrowing in the distal LAD with mild to moderate disease in the circ and RCA  . Dyslipidemia   . ED (erectile dysfunction)   . Hyperplastic colon polyp   . Hypoglycemia     Past Surgical History:  Procedure Laterality Date  . CARDIOVERSION N/A 04/23/2016   Procedure: CARDIOVERSION;  Surgeon: [name], MD;  Location: [place];  Service: Cardiovascular;  Laterality: N/A;  . CORONARY ANGIOPLASTY WITH STENT PLACEMENT    . TEE WITHOUT CARDIOVERSION N/A 04/23/2016   Procedure: TRANSESOPHAGEAL ECHOCARDIOGRAM (TEE);  Surgeon: [name], MD;  Location: [facility] ENDOSCOPY;  Service: Cardiovascular;  Laterality: N/A;    Current Outpatient Medications  Medication Sig Dispense Refill  . acetaminophen (TYLENOL) 500 MG tablet Take 500 mg by mouth every 6 (six) hours as needed for headache (pain).    Marland Kitchen amiodarone (PACERONE) 100 MG tablet Take 1 tablet (100 mg total) by mouth daily. 90 tablet 3  . atorvastatin (LIPITOR) 10 MG tablet TAKE 1 TABLET BY MOUTH EVERY DAY (Patient taking differently: TAKE 1 TABLET (10 MG) BY MOUTH EVERY MORNING) 90 tablet 1  . Cholecalciferol (VITAMIN D-3) 1000 UNITS CAPS Take 1,000 Units by mouth daily.    . fluticasone (FLONASE) 50 MCG/ACT nasal spray Place 1 spray into both nostrils daily as needed (seasonal allergies).     . loratadine (CLARITIN) 10 MG tablet Take 10 mg by mouth daily.     Marland Kitchen  midodrine (PROAMATINE) 5 MG tablet Take 2 tablets (10 mg total) by mouth 3 (three) times daily with meals. 90 tablet 11  . Psyllium (METAMUCIL PO) Take 30 mLs by mouth daily after lunch. Mix in liquid and drink    . XARELTO 20 MG TABS tablet TAKE 1 TABLET (20 MG TOTAL) BY MOUTH DAILY WITH SUPPER. 90 tablet 1   No current facility-administered medications for this visit.     Allergies as of 08/18/2017 - Review Complete 08/18/2017  Allergen Reaction Noted  . Penicillins Rash 07/04/2012    Family History  Problem Relation Age of Onset  . Breast cancer Mother   . Colon  cancer Mother   . CAD Father   . Heart attack Paternal Grandmother   . Colon cancer Brother   . Esophageal cancer Brother   . Cancer Brother     Social History   Socioeconomic History  . Marital status: Married    Spouse name: Not on file  . Number of children: Not on file  . Years of education: Not on file  . Highest education level: Not on file  Social Needs  . Financial resource strain: Not on file  . Food insecurity - worry: Not on file  . Food insecurity - inability: Not on file  . Transportation needs - medical: Not on file  . Transportation needs - non-medical: Not on file  Occupational History  . Not on file  Tobacco Use  . Smoking status: Never Smoker  . Smokeless tobacco: Never Used  Substance and Sexual Activity  . Alcohol use: Not on file  . Drug use: No  . Sexual activity: Not on file  Other Topics Concern  . Not on file  Social History Narrative  . Not on file    Review of Systems:    Constitutional: No weight loss, fever or chills Cardiovascular: No chest pain  Respiratory: No SOB  Gastrointestinal: See HPI and otherwise negative   Physical Exam:  Vital signs: BP 90/60   Pulse (!) 58   Ht 6' (1.829 m)   Wt 157 lb (71.2 kg)   BMI 21.29 kg/m   Constitutional:   Very Pleasant Elderly Caucasian male appears to be in NAD, Well developed, Well nourished, alert and cooperative Respiratory: Respirations even and unlabored. Lungs clear to auscultation bilaterally.   No wheezes, crackles, or rhonchi.  Cardiovascular: Normal S1, S2. No MRG. Regular rate and rhythm. No peripheral edema, cyanosis or pallor.  Gastrointestinal:  Soft, nondistended, nontender. No rebound or guarding. Normal bowel sounds. No appreciable masses or hepatomegaly. Psychiatric: Demonstrates good judgement and reason without abnormal affect or behaviors.  No recent labs or imaging.  Assessment: 1.  History of colon polyps: Last colonoscopy 5 years ago with a finding of a colon  polyp, we do not have pathology results from [facility] even though these were requested at last visit, repeat was recommended in 5 years per the patient 2.  Family history of colon cancer: In one of his parents 3.  Chronic anticoagulation: On Xarelto for A. fib 4.  Rectal bleeding: Scant amount of bright red blood on toilet paper when wiping, describes a hemorrhoid at that time, better after Preparation H  Plan: 1.  Rescheduled patient for surveillance colonoscopy in [place] with Dr. Fuller Plan.  Did discuss risk, benefits, limitations alternatives and patient agrees to proceed. 2.  Patient was again advised to hold his Xarelto for 2 days prior to time of this procedure.  We had previously communicated  with his cardiologist who ensured [place] that holding his Xarelto was acceptable for him. 3.  Patient to follow in clinic per recommendations from Dr. Fuller Plan after time pf procedure.  Ellouise Newer, PA-C [facility] 08/18/2017, 11:52 AM  Cc: Antony Contras, MD

## 2017-08-18 NOTE — Patient Instructions (Addendum)
You have been scheduled for a colonoscopy. Please follow written instructions given to you at your visit today.  Please pick up your prep supplies at the pharmacy within the next 1-3 days. If you use inhalers (even only as needed), please bring them with you on the day of your procedure. Your physician has requested that you go to www.startemmi.com and enter the access code given to you at your visit today. This web site gives a general overview about your procedure. However, you should still follow specific instructions given to you by our office regarding your preparation for the procedure.  Stop Xarelto 2 days before procedure. Take your last dose on 09-26-17.

## 2017-08-18 NOTE — Progress Notes (Signed)
Reviewed and agree with management plan.  Teigen Parslow T. Imelda Dandridge, MD FACG 

## 2017-09-26 ENCOUNTER — Telehealth: Payer: Self-pay | Admitting: Emergency Medicine

## 2017-09-26 ENCOUNTER — Telehealth: Payer: Self-pay | Admitting: Gastroenterology

## 2017-09-26 NOTE — Telephone Encounter (Signed)
Agree  Giovonnie Trettel MD, FACC   

## 2017-09-26 NOTE — Telephone Encounter (Signed)
   Jerry Barr 03/23/1941 [id number]    We have scheduled the above named patient for a colonoscopy procedure. Our records show that he is on anticoagulation therapy.  Please advise as to whether the patient may come off their therapy of Xarelto 2 days prior to their procedure which is scheduled for 09-29-17.  Please route your response to Tinnie Gens, CMA or fax response to [phone].  Sincerely,    [facility]

## 2017-09-26 NOTE — Telephone Encounter (Signed)
Patient with diagnosis of AFib on Xarelto for anticoagulation.    Procedure: Colonoscope Date of procedure: 09-29-17  CHADS2-VASc score of  4 (CHF, AGE x 2, CAD)  CrCl = 60 ml/min  Per DR Jordan's recommendation during last office visit . Patient to HOLD Xarelto 2 days prior to colonoscopy.  Resume as instructed by procedure MD.  Harrington Challenger PharmD, BCPS, [place] [id number] 09/26/2017 5:06 PM

## 2017-09-26 NOTE — Telephone Encounter (Signed)
Pending eval by our clinical pharmacist regarding Xarelto, patient is well known to me. Once reviewed by pharmacist, I will call the patient to clear him.

## 2017-09-27 NOTE — Telephone Encounter (Signed)
Spoke to patient and informed him to stop Xarelto and resume it after his procedure on 09-29-17. Patient verbalized understanding.

## 2017-09-27 NOTE — Telephone Encounter (Signed)
Patient informed to hold Xarelto starting today and to resume it after the procedure per discharge instructions.

## 2017-09-29 ENCOUNTER — Ambulatory Visit (AMBULATORY_SURGERY_CENTER): Payer: Medicare Other | Admitting: Gastroenterology

## 2017-09-29 ENCOUNTER — Other Ambulatory Visit: Payer: Self-pay

## 2017-09-29 ENCOUNTER — Encounter: Payer: Self-pay | Admitting: Gastroenterology

## 2017-09-29 VITALS — BP 121/69 | HR 75 | Temp 98.6°F | Resp 13 | Ht 72.0 in | Wt 157.0 lb

## 2017-09-29 DIAGNOSIS — D122 Benign neoplasm of ascending colon: Secondary | ICD-10-CM | POA: Diagnosis not present

## 2017-09-29 DIAGNOSIS — K635 Polyp of colon: Secondary | ICD-10-CM

## 2017-09-29 DIAGNOSIS — Z86010 Personal history of colonic polyps: Secondary | ICD-10-CM | POA: Diagnosis present

## 2017-09-29 DIAGNOSIS — Z1211 Encounter for screening for malignant neoplasm of colon: Secondary | ICD-10-CM | POA: Diagnosis not present

## 2017-09-29 MED ORDER — SODIUM CHLORIDE 0.9 % IV SOLN: 500.0000 mL | Freq: Once | INTRAVENOUS | Status: DC

## 2017-09-29 NOTE — Op Note (Signed)
[facility] Patient Name: Jerry Barr Procedure Date: 09/29/2017 8:34 AM MRN: [id number] Endoscopist: Ladene Artist , MD Age: 77 Referring MD:  Date of Birth: [birth year] Gender: Male Account #: [id number] Procedure:                Colonoscopy Indications:              Surveillance: Personal history of adenomatous                            polyps on last colonoscopy 5 years ago Medicines:                Monitored Anesthesia Care Procedure:                Pre-Anesthesia Assessment:                           - Prior to the procedure, a History and Physical                            was performed, and patient medications and                            allergies were reviewed. The patient's tolerance of                            previous anesthesia was also reviewed. The risks                            and benefits of the procedure and the sedation                            options and risks were discussed with the patient.                            All questions were answered, and informed consent                            was obtained. Prior Anticoagulants: The patient has                            taken Xarelto (rivaroxaban), last dose was 2 days                            prior to procedure. ASA Grade Assessment: III - A                            patient with severe systemic disease. After                            reviewing the risks and benefits, the patient was                            deemed in satisfactory condition to undergo the  procedure.                           After obtaining informed consent, the colonoscope                            was passed under direct vision. Throughout the                            procedure, the patient's blood pressure, pulse, and                            oxygen saturations were monitored continuously. The                            Colonoscope was introduced through the anus and                    advanced to the the cecum, identified by                            appendiceal orifice and ileocecal valve. The                            ileocecal valve, appendiceal orifice, and rectum                            were photographed. The quality of the bowel                            preparation was good. The colonoscopy was performed                            without difficulty. The patient tolerated the                            procedure well. Scope In: 8:40:56 AM Scope Out: 9:00:19 AM Scope Withdrawal Time: 0 hours 13 minutes 44 seconds  Total Procedure Duration: 0 hours 19 minutes 23 seconds  Findings:                 The perianal and digital rectal examinations were                            normal.                           Two sessile polyps were found in the ascending                            colon. The polyps were 6 to 7 mm in size. These                            polyps were removed with a cold snare. Resection  and retrieval were complete.                           Many small-mouthed diverticula were found in the                            left colon. There was no evidence of diverticular                            bleeding.                           Internal hemorrhoids were found during                            retroflexion. The hemorrhoids were medium-sized and                            Grade I (internal hemorrhoids that do not prolapse).                           The exam was otherwise without abnormality on                            direct and retroflexion views. Complications:            No immediate complications. Estimated blood loss:                            None. Estimated Blood Loss:     Estimated blood loss: none. Impression:               - Two 6 to 7 mm polyps in the ascending colon,                            removed with a cold snare. Resected and retrieved.                           - Moderate  diverticulosis in the left colon. There                            was no evidence of diverticular bleeding.                           - Internal hemorrhoids.                           - The examination was otherwise normal on direct                            and retroflexion views. Recommendation:           - Resume Xarelto (rivaroxaban) tomorrow at prior                            dose. Refer to managing physician for further  adjustment of therapy.                           - Patient has a contact number available for                            emergencies. The signs and symptoms of potential                            delayed complications were discussed with the                            patient. Return to normal activities tomorrow.                            Written discharge instructions were provided to the                            patient.                           - High fiber diet.                           - Continue present medications.                           - Await pathology results.                           - No aspirin, ibuprofen, naproxen, or other                            non-steroidal anti-inflammatory drugs for 2 weeks                            after polyp removal.                           - No repeat colonoscopy due to age. Ladene Artist, MD 09/29/2017 9:03:44 AM This report has been signed electronically.

## 2017-09-29 NOTE — Patient Instructions (Signed)
**  Handouts given on polyps, diverticulosis, hemorrhoids, and high fiber diet**  YOU HAD AN ENDOSCOPIC PROCEDURE TODAY: Refer to the procedure report and other information in the discharge instructions given to you for any specific questions about what was found during the examination. If this information does not answer your questions, please call [place] office at [phone] to clarify.   YOU SHOULD EXPECT: Some feelings of bloating in the abdomen. Passage of more gas than usual. Walking can help get rid of the air that was put into your GI tract during the procedure and reduce the bloating. If you had a lower endoscopy (such as a colonoscopy or flexible sigmoidoscopy) you may notice spotting of blood in your stool or on the toilet paper. Some abdominal soreness may be present for a day or two, also.  DIET: Your first meal following the procedure should be a light meal and then it is ok to progress to your normal diet. A half-sandwich or bowl of soup is an example of a good first meal. Heavy or fried foods are harder to digest and may make you feel nauseous or bloated. Drink plenty of fluids but you should avoid alcoholic beverages for 24 hours. If you had a esophageal dilation, please see attached instructions for diet.    ACTIVITY: Your care partner should take you home directly after the procedure. You should plan to take it easy, moving slowly for the rest of the day. You can resume normal activity the day after the procedure however YOU SHOULD NOT DRIVE, use power tools, machinery or perform tasks that involve climbing or major physical exertion for 24 hours (because of the sedation medicines used during the test).   SYMPTOMS TO REPORT IMMEDIATELY: A gastroenterologist can be reached at any hour. Please call [phone]  for any of the following symptoms:  Following lower endoscopy (colonoscopy, flexible sigmoidoscopy) Excessive amounts of blood in the stool  Significant tenderness, worsening  of abdominal pains  Swelling of the abdomen that is new, acute  Fever of 100 or higher    FOLLOW UP:  If any biopsies were taken you will be contacted by phone or by letter within the next 1-3 weeks. Call [phone]  if you have not heard about the biopsies in 3 weeks.  Please also call with any specific questions about appointments or follow up tests.

## 2017-09-29 NOTE — Progress Notes (Signed)
Called to room to assist during endoscopic procedure.  Patient ID and intended procedure confirmed with present staff. Received instructions for my participation in the procedure from the performing physician.  

## 2017-09-29 NOTE — Progress Notes (Signed)
A and O x3. Report to RN. Tolerated MAC anesthesia well.

## 2017-09-30 ENCOUNTER — Telehealth: Payer: Self-pay

## 2017-09-30 NOTE — Telephone Encounter (Signed)
  Follow up Call-  Call back number 09/29/2017  Post procedure Call Back phone  # [phone] cell  Permission to leave phone message Yes  Some recent data might be hidden     Patient questions:  Do you have a fever, pain , or abdominal swelling? No. Pain Score  0 *  Have you tolerated food without any problems? Yes.    Have you been able to return to your normal activities? Yes.    Do you have any questions about your discharge instructions: Diet   No. Medications  No. Follow up visit  No.  Do you have questions or concerns about your Care? No.  Actions: * If pain score is 4 or above: No action needed, pain <4.

## 2017-10-12 ENCOUNTER — Encounter: Payer: Self-pay | Admitting: Gastroenterology

## 2017-10-13 DIAGNOSIS — Z0000 Encounter for general adult medical examination without abnormal findings: Secondary | ICD-10-CM | POA: Diagnosis not present

## 2017-10-13 DIAGNOSIS — H9190 Unspecified hearing loss, unspecified ear: Secondary | ICD-10-CM | POA: Diagnosis not present

## 2017-10-13 DIAGNOSIS — R634 Abnormal weight loss: Secondary | ICD-10-CM | POA: Diagnosis not present

## 2017-10-13 DIAGNOSIS — I2510 Atherosclerotic heart disease of native coronary artery without angina pectoris: Secondary | ICD-10-CM | POA: Diagnosis not present

## 2017-10-13 DIAGNOSIS — J309 Allergic rhinitis, unspecified: Secondary | ICD-10-CM | POA: Diagnosis not present

## 2017-10-13 DIAGNOSIS — I480 Paroxysmal atrial fibrillation: Secondary | ICD-10-CM | POA: Diagnosis not present

## 2017-10-13 DIAGNOSIS — Z1389 Encounter for screening for other disorder: Secondary | ICD-10-CM | POA: Diagnosis not present

## 2017-10-13 DIAGNOSIS — I700 Atherosclerosis of aorta: Secondary | ICD-10-CM | POA: Diagnosis not present

## 2017-10-13 DIAGNOSIS — Z1211 Encounter for screening for malignant neoplasm of colon: Secondary | ICD-10-CM | POA: Diagnosis not present

## 2017-10-13 DIAGNOSIS — Z125 Encounter for screening for malignant neoplasm of prostate: Secondary | ICD-10-CM | POA: Diagnosis not present

## 2017-10-13 DIAGNOSIS — E7800 Pure hypercholesterolemia, unspecified: Secondary | ICD-10-CM | POA: Diagnosis not present

## 2017-10-13 DIAGNOSIS — I951 Orthostatic hypotension: Secondary | ICD-10-CM | POA: Diagnosis not present

## 2017-11-27 ENCOUNTER — Other Ambulatory Visit: Payer: Self-pay | Admitting: Cardiology

## 2017-11-28 NOTE — Telephone Encounter (Signed)
Rx has been sent to the pharmacy electronically. ° °

## 2017-11-29 ENCOUNTER — Other Ambulatory Visit: Payer: Self-pay | Admitting: Physician Assistant

## 2017-11-29 NOTE — Telephone Encounter (Signed)
REFILL 

## 2017-12-04 ENCOUNTER — Other Ambulatory Visit (HOSPITAL_COMMUNITY): Payer: Self-pay | Admitting: Cardiology

## 2017-12-06 NOTE — Telephone Encounter (Signed)
Rx sent to pharmacy   

## 2018-01-18 NOTE — Progress Notes (Signed)
Cardiology Office Note    Date:  01/19/2018   ID:  Jerry Barr, DOB 1941/02/04, MRN [id number]  PCP:  Antony Contras, MD  Cardiologist:  Dr. Martinique   Chief Complaint  Patient presents with  . Dizziness  . Coronary Artery Disease    History of Present Illness:  Jerry Barr is a 77 y.o. male with PMH of BPH, HLD, chronic hypotension, PAF on Xarelto and CAD s/p DES to LAD in 2011. Patient was admitted to [facility] on 04/20/2016 for evaluation of palpitation, fatigue and chest pain and found to have new atrial fibrillation. Prior to this admission, he did have a nuclear stress test in April 2016 that showed no evidence of ischemia. Due to difficulty controlling her heart rate, he was placed on Xarelto and amiodarone. Echocardiogram obtained on 04/22/2016 showed EF 50-55%, no regional wall motion abnormality, mild MR. He eventually underwent successful TEE cardioversion on 04/23/2016.  Patient was seen in November 2018 for orthostatic hypotension.  He was instructed to taking more salt and had Midodrin 5 mg 3 times daily added to his medical regimen.  He was admitted to the hospital on 06/13/2017 was chest pain.  He was ruled out of MI by cardiac enzymes.  During the recent admission, Midodrin was increased to 10 mg 3 times daily.  He underwent Myoview on 07/10/2017 which showed EF 55%, no significant ischemia or scar.  Overall considered low risk study.  Echocardiogram obtained on 06/12/2017 showed EF 45-50%, mild diffuse hypokinesis, trivial AI, PA peak pressure 24 mmHg.  On follow up today he reports he is doing much better. Since the end of March he really hasn't had any symptoms of orthostasis on Midodrin. He still notes low BP readings in the early morning but no symptoms. After taking his morning Midodrin BP improves. He denies any chest pain or dyspnea. Still walking 35-40 minutes 5 days a week. Feels well.    Past Medical History:  Diagnosis Date  . Allergy   . BPH  (benign prostatic hyperplasia)   . Cancer (HCC)    basal cell ca - face, nose and right leg  . Clotting disorder (HCC)    a fib hx - Xarelto  . Coronary artery disease    Promus drug-eluting stent to a 99% mid LAD, 40-50% narrowing in the distal LAD with mild to moderate disease in the circ and RCA  . Dyslipidemia   . ED (erectile dysfunction)   . Hyperplastic colon polyp   . Hypoglycemia     Past Surgical History:  Procedure Laterality Date  . CARDIOVERSION N/A 04/23/2016   Procedure: CARDIOVERSION;  Surgeon: [name], MD;  Location[facility] ENDOSCOPY;  Service: Cardiovascular;  Laterality: N/A;  . COLONOSCOPY    . CORONARY ANGIOPLASTY WITH STENT PLACEMENT    . POLYPECTOMY    . TEE WITHOUT CARDIOVERSION N/A 04/23/2016   Procedure: TRANSESOPHAGEAL ECHOCARDIOGRAM (TEE);  Surgeon: [name], MD;  Location: [facility] ENDOSCOPY;  Service: Cardiovascular;  Laterality: N/A;  . UPPER GASTROINTESTINAL ENDOSCOPY      Current Medications: Outpatient Medications Prior to Visit  Medication Sig Dispense Refill  . acetaminophen (TYLENOL) 500 MG tablet Take 500 mg by mouth every 6 (six) hours as needed for headache (pain).    Marland Kitchen amiodarone (PACERONE) 100 MG tablet Take 1 tablet (100 mg total) by mouth daily. 90 tablet 3  . atorvastatin (LIPITOR) 10 MG tablet TAKE 1 TABLET BY MOUTH EVERY DAY 90 tablet 0  . Cholecalciferol (VITAMIN  D-3) 1000 UNITS CAPS Take 1,000 Units by mouth daily.    . fluticasone (FLONASE) 50 MCG/ACT nasal spray Place 1 spray into both nostrils daily as needed (seasonal allergies).     . loratadine (CLARITIN) 10 MG tablet Take 10 mg by mouth daily.     . midodrine (PROAMATINE) 5 MG tablet TAKE 2 TABLETS 3 TIMES A DAY WITH MEALS 540 tablet 1  . Wheat Dextrin (BENEFIBER PO) Take 30 mLs by mouth daily.    Alveda Reasons 20 MG TABS tablet TAKE 1 TABLET (20 MG TOTAL) BY MOUTH DAILY WITH SUPPER. (Patient taking differently: TAKE 1 TABLET (20 MG TOTAL) BY MOUTH DAILY MORNINGS) 90 tablet 1    . Psyllium (METAMUCIL PO) Take 30 mLs by mouth daily after lunch. Mix in liquid and drink     Facility-Administered Medications Prior to Visit  Medication Dose Route Frequency Provider Last Rate Last Dose  . 0.9 %  sodium chloride infusion  500 mL Intravenous Once Ladene Artist, MD         Allergies:   Penicillins   Social History   Socioeconomic History  . Marital status: Married    Spouse name: Not on file  . Number of children: Not on file  . Years of education: Not on file  . Highest education level: Not on file  Occupational History  . Not on file  Social Needs  . Financial resource strain: Not on file  . Food insecurity:    Worry: Not on file    Inability: Not on file  . Transportation needs:    Medical: Not on file    Non-medical: Not on file  Tobacco Use  . Smoking status: Never Smoker  . Smokeless tobacco: Never Used  Substance and Sexual Activity  . Alcohol use: Yes    Alcohol/week: 3.0 oz    Types: 5 Glasses of wine per week    Comment: red wine  . Drug use: No  . Sexual activity: Not on file  Lifestyle  . Physical activity:    Days per week: Not on file    Minutes per session: Not on file  . Stress: Not on file  Relationships  . Social connections:    Talks on phone: Not on file    Gets together: Not on file    Attends religious service: Not on file    Active member of club or organization: Not on file    Attends meetings of clubs or organizations: Not on file    Relationship status: Not on file  Other Topics Concern  . Not on file  Social History Narrative  . Not on file     Family History:  The patient's family history includes Bone cancer in his paternal grandfather; Breast cancer in his mother; CAD in his father; Cancer in his brother; Colon cancer in his brother and mother; Esophageal cancer in his brother; Heart attack in his paternal grandmother; Leukemia in his paternal uncle; Liver cancer in his brother.   ROS:   Please see the  history of present illness.    ROS All other systems reviewed and are negative.   PHYSICAL EXAM:   VS:  BP 115/72   Pulse 70   Ht 6' (1.829 m)   Wt 156 lb 3.2 oz (70.9 kg)   BMI 21.18 kg/m    GEN: Well nourished, well developed, in no acute distress  HEENT: normal  Neck: no JVD, carotid bruits, or masses Cardiac: RRR; no murmurs,  rubs, or gallops,no edema  Respiratory:  clear to auscultation bilaterally, normal work of breathing GI: soft, nontender, nondistended, + BS MS: no deformity or atrophy  Skin: warm and dry, no rash Neuro:  Alert and Oriented x 3, Strength and sensation are intact Psych: euthymic mood, full affect  Wt Readings from Last 3 Encounters:  01/19/18 156 lb 3.2 oz (70.9 kg)  09/29/17 157 lb (71.2 kg)  08/18/17 157 lb (71.2 kg)      Studies/Labs Reviewed:   EKG:  EKG is not ordered today.   Recent Labs: 06/13/2017: ALT 18; BUN 21; Creatinine, Ser 1.05; Hemoglobin 14.1; Platelets 242; Potassium 4.1; Sodium 139   Lipid Panel    Component Value Date/Time   CHOL 131 06/29/2016 1011   TRIG 42 06/29/2016 1011   HDL 43 06/29/2016 1011   CHOLHDL 3.0 06/29/2016 1011   VLDL 8 06/29/2016 1011   LDLCALC 80 06/29/2016 1011   Dated 10/13/17; cholesterol 148, triglycerides 44, HDL 51, LDL 88. CBC, chemistries, TSH normal   Additional studies/ records that were reviewed today include:   Echo 04/22/2016 LV EF: 50% -   55%  Study Conclusions  - Left ventricle: The cavity size was normal. Wall thickness was   normal. Systolic function was normal. The estimated ejection   fraction was in the range of 50% to 55%. Wall motion was normal;   there were no regional wall motion abnormalities. - Mitral valve: Calcified annulus. There was mild regurgitation. - Left atrium: The atrium was moderately dilated. - Right atrium: The atrium was mildly dilated. - Pericardium, extracardiac: A trivial pericardial effusion was   identified.  Impressions:  - Low normal LV  systolic function; mild MR; biatrial enlargement;   mild TR.    Echo 06/13/2017 LV EF: 45% -   50%  Study Conclusions  - Left ventricle: The cavity size was normal. Systolic function was   mildly reduced. The estimated ejection fraction was in the range   of 45% to 50%. Mild diffuse hypokinesis. Left ventricular   diastolic function parameters were normal. - Aortic valve: Transvalvular velocity was within the normal range.   There was no stenosis. There was trivial regurgitation. - Mitral valve: There was trivial regurgitation. - Left atrium: The atrium was moderately dilated. - Right ventricle: The cavity size was normal. Wall thickness was   normal. Systolic function was normal. - Right atrium: The atrium was severely dilated. - Atrial septum: No defect or patent foramen ovale was identified   by color flow Doppler. - Tricuspid valve: There was trivial regurgitation. - Pulmonary arteries: Systolic pressure was within the normal   range. PA peak pressure: 24 mm Hg (S).   Myoview 06/13/2017  Blood pressure demonstrated a normal response to exercise.  Clinically negative, electrically positive with EKG changes in Stage III. Good exercise tolerance  Inferior, inferolateral , inferoseptal thinning with no significant change in the recovery images consistent with probable soft tissue attenuation (diaphragm) No significant ischemia or scar.  Nuclear stress EF: 55%.  Overall low risk scan   ASSESSMENT:    1. Coronary artery disease involving native coronary artery of native heart without angina pectoris   2. PAF (paroxysmal atrial fibrillation) ([place])   3. Hyperlipidemia, unspecified hyperlipidemia type   4. Orthostatic hypotension   5. Therapeutic drug monitoring      PLAN:  In order of problems listed above:  1. Orthostatic hypotension: Currently on Midrin 10 mg 3 times daily.  Blood pressure stable. He is  asymptomatic even though he has some low readings in the am.  Continue current therapy.  2. PAF on amiodarone and Xarelto: maintaining sinus rhythm, heart rate very well controlled  3. CAD: Denies any chest pain.  Not on aspirin due to the need for Xarelto.    4. Hyperlipidemia: On Lipitor 10 mg daily. Last LDL 88. Goal 70. Will increase lipitor to 20 mg daily. Repeat labs in 3 months.    Medication Adjustments/Labs and Tests Ordered: Current medicines are reviewed at length with the patient today.  Concerns regarding medicines are outlined above.  Medication changes, Labs and Tests ordered today are listed in the Patient Instructions below. Patient Instructions  Continue your therapy except increase lipitor to 20 mg daily-we will check lab work in 3 months.  I will see you in 6 months     Signed, Takyia Sindt Martinique, MD  01/19/2018 2:43 PM    [facility] [place], [place], [place]  [address] Phone: [phone]; Fax: [phone]

## 2018-01-19 ENCOUNTER — Encounter: Payer: Self-pay | Admitting: Cardiology

## 2018-01-19 ENCOUNTER — Ambulatory Visit (INDEPENDENT_AMBULATORY_CARE_PROVIDER_SITE_OTHER): Payer: Medicare Other | Admitting: Cardiology

## 2018-01-19 VITALS — BP 115/72 | HR 70 | Ht 72.0 in | Wt 156.2 lb

## 2018-01-19 DIAGNOSIS — I2510 Atherosclerotic heart disease of native coronary artery without angina pectoris: Secondary | ICD-10-CM | POA: Diagnosis not present

## 2018-01-19 DIAGNOSIS — I951 Orthostatic hypotension: Secondary | ICD-10-CM | POA: Diagnosis not present

## 2018-01-19 DIAGNOSIS — I480 Paroxysmal atrial fibrillation: Secondary | ICD-10-CM

## 2018-01-19 DIAGNOSIS — Z5181 Encounter for therapeutic drug level monitoring: Secondary | ICD-10-CM | POA: Diagnosis not present

## 2018-01-19 DIAGNOSIS — E785 Hyperlipidemia, unspecified: Secondary | ICD-10-CM

## 2018-01-19 MED ORDER — ATORVASTATIN CALCIUM 20 MG PO TABS: 20.0000 mg | ORAL_TABLET | Freq: Every day | ORAL | 3 refills | Status: DC

## 2018-01-19 NOTE — Patient Instructions (Addendum)
Continue your therapy except increase lipitor to 20 mg daily-we will check lab work in 3 months.  I will see you in 6 months   You will receive a reminder letter in the mail two months in advance. If you don't receive a letter, please call our office to schedule the follow-up appointment.

## 2018-02-02 DIAGNOSIS — I951 Orthostatic hypotension: Secondary | ICD-10-CM | POA: Diagnosis not present

## 2018-02-02 DIAGNOSIS — G4752 REM sleep behavior disorder: Secondary | ICD-10-CM | POA: Diagnosis not present

## 2018-02-02 DIAGNOSIS — I480 Paroxysmal atrial fibrillation: Secondary | ICD-10-CM | POA: Diagnosis not present

## 2018-02-02 DIAGNOSIS — E7800 Pure hypercholesterolemia, unspecified: Secondary | ICD-10-CM | POA: Diagnosis not present

## 2018-02-22 ENCOUNTER — Other Ambulatory Visit: Payer: Self-pay | Admitting: Cardiology

## 2018-03-02 ENCOUNTER — Other Ambulatory Visit: Payer: Self-pay | Admitting: Physician Assistant

## 2018-03-06 ENCOUNTER — Other Ambulatory Visit: Payer: Self-pay | Admitting: Cardiology

## 2018-03-06 DIAGNOSIS — D229 Melanocytic nevi, unspecified: Secondary | ICD-10-CM | POA: Diagnosis not present

## 2018-03-06 DIAGNOSIS — L570 Actinic keratosis: Secondary | ICD-10-CM | POA: Diagnosis not present

## 2018-03-07 MED ORDER — MIDODRINE HCL 5 MG PO TABS: ORAL_TABLET | ORAL | 1 refills | Status: DC

## 2018-03-07 NOTE — Addendum Note (Signed)
Addended by: Merlene Laughter on: 03/07/2018 02:24 PM   Modules accepted: Orders

## 2018-03-15 ENCOUNTER — Telehealth: Payer: Self-pay | Admitting: Cardiology

## 2018-03-15 NOTE — Telephone Encounter (Signed)
New Message  Patient has changed the way he takes his blood pressure meds...Marland KitchenMarland Kitchen      Pt c/o medication issue:  1. Name of Medication:Midodrine   2. How are you currently taking this medication (dosage and times per day)? He is taking 2 @ breakfast 1@ lunch 1@ dinner  3. Are you having a reaction (difficulty breathing--STAT)? This keeps bp regulated   4. What is your medication issue? He has cut back and wanted to make sure this is ok.Pls advise

## 2018-03-15 NOTE — Telephone Encounter (Signed)
Returned call to patient Dr.Jordan advised sounds good.Advised to keep appointment as planned.

## 2018-03-15 NOTE — Telephone Encounter (Signed)
Returned call to patient.He stated he wanted to let Dr.Jordan know he decreased Midodrine dose to 2 tablets in am,1 tablet at lunch and 1 tablet at dinner.Stated this dose seems to work well.B/P ranging systolic 432.Advised I will send message to [place].

## 2018-03-15 NOTE — Telephone Encounter (Signed)
Sounds good  Wilmar Prabhakar MD, FACC   

## 2018-03-27 DIAGNOSIS — G4752 REM sleep behavior disorder: Secondary | ICD-10-CM | POA: Diagnosis not present

## 2018-04-05 ENCOUNTER — Other Ambulatory Visit: Payer: Self-pay | Admitting: Dermatology

## 2018-04-05 DIAGNOSIS — Z23 Encounter for immunization: Secondary | ICD-10-CM | POA: Diagnosis not present

## 2018-04-05 DIAGNOSIS — D692 Other nonthrombocytopenic purpura: Secondary | ICD-10-CM | POA: Diagnosis not present

## 2018-04-05 DIAGNOSIS — L570 Actinic keratosis: Secondary | ICD-10-CM | POA: Diagnosis not present

## 2018-04-05 DIAGNOSIS — D0461 Carcinoma in situ of skin of right upper limb, including shoulder: Secondary | ICD-10-CM | POA: Diagnosis not present

## 2018-04-19 DIAGNOSIS — I951 Orthostatic hypotension: Secondary | ICD-10-CM | POA: Diagnosis not present

## 2018-04-19 DIAGNOSIS — Z5181 Encounter for therapeutic drug level monitoring: Secondary | ICD-10-CM | POA: Diagnosis not present

## 2018-04-19 DIAGNOSIS — E785 Hyperlipidemia, unspecified: Secondary | ICD-10-CM | POA: Diagnosis not present

## 2018-04-19 LAB — LIPID PANEL
Chol/HDL Ratio: 2.6 ratio (ref 0.0–5.0)
Cholesterol, Total: 143 mg/dL (ref 100–199)
HDL: 55 mg/dL
LDL Calculated: 80 mg/dL (ref 0–99)
Triglycerides: 40 mg/dL (ref 0–149)
VLDL Cholesterol Cal: 8 mg/dL (ref 5–40)

## 2018-04-19 LAB — COMPREHENSIVE METABOLIC PANEL
ALK PHOS: 76 IU/L (ref 39–117)
ALT: 20 IU/L (ref 0–44)
AST: 18 IU/L (ref 0–40)
Albumin/Globulin Ratio: 2 (ref 1.2–2.2)
Albumin: 4.3 g/dL (ref 3.5–4.8)
BILIRUBIN TOTAL: 0.6 mg/dL (ref 0.0–1.2)
BUN/Creatinine Ratio: 19 (ref 10–24)
BUN: 20 mg/dL (ref 8–27)
CHLORIDE: 101 mmol/L (ref 96–106)
CO2: 26 mmol/L (ref 20–29)
Calcium: 9.5 mg/dL (ref 8.6–10.2)
Creatinine, Ser: 1.07 mg/dL (ref 0.76–1.27)
GFR calc Af Amer: 78 mL/min/{1.73_m2} (ref >59)
GFR calc non Af Amer: 67 mL/min/{1.73_m2} (ref >59)
GLUCOSE: 84 mg/dL (ref 65–99)
Globulin, Total: 2.2 g/dL (ref 1.5–4.5)
Potassium: 5.5 mmol/L — ABNORMAL HIGH (ref 3.5–5.2)
Sodium: 141 mmol/L (ref 134–144)
TOTAL PROTEIN: 6.5 g/dL (ref 6.0–8.5)

## 2018-04-20 ENCOUNTER — Other Ambulatory Visit: Payer: Self-pay

## 2018-04-20 ENCOUNTER — Telehealth: Payer: Self-pay

## 2018-04-20 DIAGNOSIS — E875 Hyperkalemia: Secondary | ICD-10-CM

## 2018-04-20 NOTE — Progress Notes (Signed)
bmet  

## 2018-04-20 NOTE — Telephone Encounter (Signed)
Spoke with pt. Pt aware of lab results and Dr.Jordan's recommendations.  Notes recorded by Martinique, Peter M, MD on 04/19/2018 at 7:28 PM EDT Chemistries are normal except for elevated potassium. Lipids look OK and are unchanged. He is on no medication that affects his potassium and this may be spurious. Drink plenty of fluids. Repeat BMET at his convenience.  Peter Martinique MD, [place] that he eats a banana daily. Adv pt to limit potassium in his diet and to liberalize his fluids. Pt sts that he will come to the office early next week for repeat lab.

## 2018-04-24 ENCOUNTER — Other Ambulatory Visit: Payer: Self-pay | Admitting: Cardiology

## 2018-04-24 DIAGNOSIS — I480 Paroxysmal atrial fibrillation: Secondary | ICD-10-CM

## 2018-04-26 DIAGNOSIS — E875 Hyperkalemia: Secondary | ICD-10-CM | POA: Diagnosis not present

## 2018-04-26 LAB — BASIC METABOLIC PANEL
BUN/Creatinine Ratio: 23 (ref 10–24)
BUN: 24 mg/dL (ref 8–27)
CALCIUM: 9.2 mg/dL (ref 8.6–10.2)
CO2: 24 mmol/L (ref 20–29)
CREATININE: 1.03 mg/dL (ref 0.76–1.27)
Chloride: 99 mmol/L (ref 96–106)
GFR calc Af Amer: 81 mL/min/{1.73_m2} (ref >59)
GFR, EST NON AFRICAN AMERICAN: 70 mL/min/{1.73_m2} (ref >59)
Glucose: 77 mg/dL (ref 65–99)
POTASSIUM: 5.5 mmol/L — AB (ref 3.5–5.2)
Sodium: 139 mmol/L (ref 134–144)

## 2018-05-24 ENCOUNTER — Other Ambulatory Visit: Payer: Self-pay | Admitting: Cardiology

## 2018-06-14 ENCOUNTER — Telehealth: Payer: Self-pay | Admitting: Cardiology

## 2018-06-14 NOTE — Telephone Encounter (Signed)
Yes this is OK  Chara Marquard MD, FACC  

## 2018-06-14 NOTE — Telephone Encounter (Signed)
New message   Pt c/o medication issue:  1. Name of Medication: midodrine (PROAMATINE) 5 MG tablet  2. How are you currently taking this medication (dosage and times per day)? Take 2 tablets am,1 tablet lunch,1 tablet dinner  3. Are you having a reaction (difficulty breathing--STAT)? n/a  4. What is your medication issue? Patient wants to make sure that it okay for him to take 1 tablet. He states that when he takes 1 tablet his b/p stays normal.

## 2018-06-14 NOTE — Telephone Encounter (Signed)
Called patient, he states that back in August he switch taking his midodrine medication 2 in the am, 1 in the afternoon, and 1 in the evening. Patient states he continued to have issues with low BP in the morning and higher in the afternoons. Into the 140's. He has since switched to taking it only 1 tablet in the morning only and has found that his BP has been better. He states it is still low in the morning around 80/55, but within an hour it gets to 110/70 and stays that way throughout the day.   Patient would like to know if Dr.Jordan is okay with this plan. Thank you!

## 2018-06-15 NOTE — Telephone Encounter (Signed)
Spoke to patient Dr.Jordan's advice given. 

## 2018-06-26 DIAGNOSIS — G4752 REM sleep behavior disorder: Secondary | ICD-10-CM | POA: Diagnosis not present

## 2018-07-23 NOTE — Progress Notes (Signed)
Cardiology Office Note    Date:  07/26/2018   ID:  Jerry Barr, DOB [birth year], MRN [id number]  PCP:  Jerry Contras, MD  Cardiologist:  Dr. Martinique   Chief Complaint  Patient presents with  . Atrial Fibrillation  . Coronary Artery Disease    History of Present Illness:  Jerry Barr is a 78 y.o. male with PMH of BPH, HLD, chronic hypotension, PAF on Xarelto and CAD s/p DES to LAD in 2011. Patient was admitted to [facility] on 04/20/2016 for evaluation of palpitation, fatigue and chest pain and found to have new atrial fibrillation. Prior to this admission, he did have a nuclear stress test in April 2016 that showed no evidence of ischemia. Due to difficulty controlling her heart rate, he was placed on Xarelto and amiodarone. Echocardiogram obtained on 04/22/2016 showed EF 50-55%, no regional wall motion abnormality, mild MR. He eventually underwent successful TEE cardioversion on 04/23/2016.  Patient was seen in November 2018 for orthostatic hypotension.  He was instructed to taking more salt and had Midodrin 5 mg 3 times daily added to his medical regimen.  He was admitted to the hospital on 06/13/2017 was chest pain.  He was ruled out of MI by cardiac enzymes.  During the recent admission, Midodrin was increased to 10 mg 3 times daily.  He underwent Myoview on 07/10/2017 which showed EF 55%, no significant ischemia or scar.  Overall considered low risk study.  Echocardiogram obtained on 06/12/2017 showed EF 45-50%, mild diffuse hypokinesis, trivial AI, PA peak pressure 24 mmHg.  On follow up today he reports he is doing very well. He has adjusted his midodrine down to once a day. He still has low BP in the early morning but no symptoms. He just takes it easy in the morning and is fine the rest of the day. No palpitations, chest pain or dyspnea.  Since the end of March he really hasn't had any symptoms of orthostasis on Midodrin. Prior labs showed mildly elevated potassium 5.5. On  no medication that affects his potassium. He does eat a lot of high potassium food.    Past Medical History:  Diagnosis Date  . Allergy   . BPH (benign prostatic hyperplasia)   . Cancer (HCC)    basal cell ca - face, nose and right leg  . Clotting disorder (HCC)    a fib hx - Xarelto  . Coronary artery disease    Promus drug-eluting stent to a 99% mid LAD, 40-50% narrowing in the distal LAD with mild to moderate disease in the circ and RCA  . Dyslipidemia   . ED (erectile dysfunction)   . Hyperplastic colon polyp   . Hypoglycemia     Past Surgical History:  Procedure Laterality Date  . CARDIOVERSION N/A 04/23/2016   Procedure: CARDIOVERSION;  Surgeon: [name], MD;  Location: [facility] ENDOSCOPY;  Service: Cardiovascular;  Laterality: N/A;  . COLONOSCOPY    . CORONARY ANGIOPLASTY WITH STENT PLACEMENT    . POLYPECTOMY    . TEE WITHOUT CARDIOVERSION N/A 04/23/2016   Procedure: TRANSESOPHAGEAL ECHOCARDIOGRAM (TEE);  Surgeon: [name], MD;  Location: [facility] ENDOSCOPY;  Service: Cardiovascular;  Laterality: N/A;  . UPPER GASTROINTESTINAL ENDOSCOPY      Current Medications: Outpatient Medications Prior to Visit  Medication Sig Dispense Refill  . acetaminophen (TYLENOL) 500 MG tablet Take 500 mg by mouth every 6 (six) hours as needed for headache (pain).    Marland Kitchen amiodarone (PACERONE) 100 MG tablet TAKE  1 TABLET BY MOUTH EVERY DAY 90 tablet 1  . atorvastatin (LIPITOR) 20 MG tablet Take 1 tablet (20 mg total) by mouth daily. 90 tablet 3  . Cholecalciferol (VITAMIN D-3) 1000 UNITS CAPS Take 1,000 Units by mouth daily.    . fluticasone (FLONASE) 50 MCG/ACT nasal spray Place 1 spray into both nostrils daily as needed (seasonal allergies).     . loratadine (CLARITIN) 10 MG tablet Take 10 mg by mouth daily.     . midodrine (PROAMATINE) 5 MG tablet Take 5 mg by mouth. Take 1 tablet in the am.    . Wheat Dextrin (BENEFIBER PO) Take 30 mLs by mouth daily.    Alveda Reasons 20 MG TABS tablet TAKE 1  TABLET (20 MG TOTAL) BY MOUTH DAILY WITH SUPPER. (Patient taking differently: 20 mg daily with breakfast. ) 90 tablet 1   Facility-Administered Medications Prior to Visit  Medication Dose Route Frequency Provider Last Rate Last Dose  . 0.9 %  sodium chloride infusion  500 mL Intravenous Once Jerry Artist, MD         Allergies:   Penicillins   Social History   Socioeconomic History  . Marital status: Married    Spouse name: Not on file  . Number of children: Not on file  . Years of education: Not on file  . Highest education level: Not on file  Occupational History  . Not on file  Social Needs  . Financial resource strain: Not on file  . Food insecurity:    Worry: Not on file    Inability: Not on file  . Transportation needs:    Medical: Not on file    Non-medical: Not on file  Tobacco Use  . Smoking status: Never Smoker  . Smokeless tobacco: Never Used  Substance and Sexual Activity  . Alcohol use: Yes    Alcohol/week: 5.0 standard drinks    Types: 5 Glasses of wine per week    Comment: red wine  . Drug use: No  . Sexual activity: Not on file  Lifestyle  . Physical activity:    Days per week: Not on file    Minutes per session: Not on file  . Stress: Not on file  Relationships  . Social connections:    Talks on phone: Not on file    Gets together: Not on file    Attends religious service: Not on file    Active member of club or organization: Not on file    Attends meetings of clubs or organizations: Not on file    Relationship status: Not on file  Other Topics Concern  . Not on file  Social History Narrative  . Not on file     Family History:  The patient's family history includes Bone cancer in his paternal grandfather; Breast cancer in his mother; CAD in his father; Cancer in his brother; Colon cancer in his brother and mother; Esophageal cancer in his brother; Heart attack in his paternal grandmother; Leukemia in his paternal uncle; Liver cancer in his  brother.   ROS:   Please see the history of present illness.    ROS All other systems reviewed and are negative.   PHYSICAL EXAM:   VS:  BP 122/74   Pulse 67   Ht 6' (1.829 m)   Wt 155 lb 12.8 oz (70.7 kg)   BMI 21.13 kg/m    GENERAL:  Well appearing, thin WM in NAD HEENT:  PERRL, EOMI, sclera are clear.  Oropharynx is clear. NECK:  No jugular venous distention, carotid upstroke brisk and symmetric, no bruits, no thyromegaly or adenopathy LUNGS:  Clear to auscultation bilaterally CHEST:  Unremarkable HEART:  RRR,  PMI not displaced or sustained,S1 and S2 within normal limits, no S3, no S4: no clicks, no rubs, no murmurs ABD:  Soft, nontender. BS +, no masses or bruits. No hepatomegaly, no splenomegaly EXT:  2 + pulses throughout, no edema, no cyanosis no clubbing SKIN:  Warm and dry.  No rashes NEURO:  Alert and oriented x 3. Cranial nerves II through XII intact. PSYCH:  Cognitively intact    Wt Readings from Last 3 Encounters:  07/26/18 155 lb 12.8 oz (70.7 kg)  01/19/18 156 lb 3.2 oz (70.9 kg)  09/29/17 157 lb (71.2 kg)      Studies/Labs Reviewed:   EKG:  EKG is  ordered today. NSR with first degree AV block. Otherwise normal. I have personally reviewed and interpreted this study.   Recent Labs: 04/19/2018: ALT 20 04/26/2018: BUN 24; Creatinine, Ser 1.03; Potassium 5.5; Sodium 139   Lipid Panel    Component Value Date/Time   CHOL 143 04/19/2018 0807   TRIG 40 04/19/2018 0807   HDL 55 04/19/2018 0807   CHOLHDL 2.6 04/19/2018 0807   CHOLHDL 3.0 06/29/2016 1011   VLDL 8 06/29/2016 1011   LDLCALC 80 04/19/2018 0807   Dated 10/13/17; cholesterol 148, triglycerides 44, HDL 51, LDL 88. CBC, chemistries, TSH normal   Additional studies/ records that were reviewed today include:   Echo 04/22/2016 LV EF: 50% -   55%  Study Conclusions  - Left ventricle: The cavity size was normal. Wall thickness was   normal. Systolic function was normal. The estimated  ejection   fraction was in the range of 50% to 55%. Wall motion was normal;   there were no regional wall motion abnormalities. - Mitral valve: Calcified annulus. There was mild regurgitation. - Left atrium: The atrium was moderately dilated. - Right atrium: The atrium was mildly dilated. - Pericardium, extracardiac: A trivial pericardial effusion was   identified.  Impressions:  - Low normal LV systolic function; mild MR; biatrial enlargement;   mild TR.    Echo 06/13/2017 LV EF: 45% -   50%  Study Conclusions  - Left ventricle: The cavity size was normal. Systolic function was   mildly reduced. The estimated ejection fraction was in the range   of 45% to 50%. Mild diffuse hypokinesis. Left ventricular   diastolic function parameters were normal. - Aortic valve: Transvalvular velocity was within the normal range.   There was no stenosis. There was trivial regurgitation. - Mitral valve: There was trivial regurgitation. - Left atrium: The atrium was moderately dilated. - Right ventricle: The cavity size was normal. Wall thickness was   normal. Systolic function was normal. - Right atrium: The atrium was severely dilated. - Atrial septum: No defect or patent foramen ovale was identified   by color flow Doppler. - Tricuspid valve: There was trivial regurgitation. - Pulmonary arteries: Systolic pressure was within the normal   range. PA peak pressure: 24 mm Hg (S).   Myoview 06/13/2017  Blood pressure demonstrated a normal response to exercise.  Clinically negative, electrically positive with EKG changes in Stage III. Good exercise tolerance  Inferior, inferolateral , inferoseptal thinning with no significant change in the recovery images consistent with probable soft tissue attenuation (diaphragm) No significant ischemia or scar.  Nuclear stress EF: 55%.  Overall low risk scan  ASSESSMENT:    1. Coronary artery disease involving native coronary artery of native  heart without angina pectoris   2. PAF (paroxysmal atrial fibrillation) ([place])   3. Orthostatic hypotension      PLAN:  In order of problems listed above:  1. Orthostatic hypotension: Currently on Midrin daily and doing well without dizziness. BP low in the am but otherwise stable.  Continue current therapy.  2. PAF on amiodarone and Xarelto: maintaining sinus rhythm on low dose amiodarone. LFTs and TSH normal.  3. CAD: Denies any chest pain.  Not on aspirin due to the need for Xarelto.    4. Hyperlipidemia: On  lipitor to 20 mg daily. LDL 80.   5.   Hyperkalemia. Mild. Would limit high potassium food intake. Will have follow up labs with Dr. Moreen Barr this spring.     Medication Adjustments/Labs and Tests Ordered: Current medicines are reviewed at length with the patient today.  Concerns regarding medicines are outlined above.  Medication changes, Labs and Tests ordered today are listed in the Patient Instructions below. There are no Patient Instructions on file for this visit.   Signed, Jerry Belmontes Martinique, MD  07/26/2018 11:00 AM    [facility] [place], [place], [place]  [address] Phone: [phone]; Fax: [phone]

## 2018-07-26 ENCOUNTER — Encounter: Payer: Self-pay | Admitting: Cardiology

## 2018-07-26 ENCOUNTER — Ambulatory Visit (INDEPENDENT_AMBULATORY_CARE_PROVIDER_SITE_OTHER): Payer: Medicare Other | Admitting: Cardiology

## 2018-07-26 VITALS — BP 122/74 | HR 67 | Ht 72.0 in | Wt 155.8 lb

## 2018-07-26 DIAGNOSIS — I951 Orthostatic hypotension: Secondary | ICD-10-CM

## 2018-07-26 DIAGNOSIS — I480 Paroxysmal atrial fibrillation: Secondary | ICD-10-CM | POA: Diagnosis not present

## 2018-07-26 DIAGNOSIS — I2510 Atherosclerotic heart disease of native coronary artery without angina pectoris: Secondary | ICD-10-CM | POA: Diagnosis not present

## 2018-07-28 DIAGNOSIS — H52203 Unspecified astigmatism, bilateral: Secondary | ICD-10-CM | POA: Diagnosis not present

## 2018-07-28 DIAGNOSIS — H5203 Hypermetropia, bilateral: Secondary | ICD-10-CM | POA: Diagnosis not present

## 2018-07-28 DIAGNOSIS — H2513 Age-related nuclear cataract, bilateral: Secondary | ICD-10-CM | POA: Diagnosis not present

## 2018-10-15 ENCOUNTER — Emergency Department (HOSPITAL_COMMUNITY): Payer: Medicare Other

## 2018-10-15 ENCOUNTER — Emergency Department (HOSPITAL_COMMUNITY)
Admission: EM | Admit: 2018-10-15 | Discharge: 2018-10-15 | Disposition: A | Discharge status: Home / Self Care | Payer: Medicare Other | Attending: Emergency Medicine | Admitting: Emergency Medicine

## 2018-10-15 ENCOUNTER — Telehealth: Payer: Self-pay | Admitting: Cardiology

## 2018-10-15 ENCOUNTER — Encounter (HOSPITAL_COMMUNITY): Payer: Self-pay | Admitting: Emergency Medicine

## 2018-10-15 DIAGNOSIS — I959 Hypotension, unspecified: Secondary | ICD-10-CM | POA: Diagnosis not present

## 2018-10-15 DIAGNOSIS — I4891 Unspecified atrial fibrillation: Secondary | ICD-10-CM | POA: Diagnosis not present

## 2018-10-15 DIAGNOSIS — R531 Weakness: Secondary | ICD-10-CM | POA: Diagnosis present

## 2018-10-15 DIAGNOSIS — I480 Paroxysmal atrial fibrillation: Secondary | ICD-10-CM | POA: Insufficient documentation

## 2018-10-15 DIAGNOSIS — Z79899 Other long term (current) drug therapy: Secondary | ICD-10-CM | POA: Diagnosis not present

## 2018-10-15 DIAGNOSIS — R079 Chest pain, unspecified: Secondary | ICD-10-CM | POA: Diagnosis not present

## 2018-10-15 DIAGNOSIS — R0902 Hypoxemia: Secondary | ICD-10-CM | POA: Diagnosis not present

## 2018-10-15 LAB — COMPREHENSIVE METABOLIC PANEL
ALT: 38 U/L (ref 0–44)
AST: 28 U/L (ref 15–41)
Albumin: 3.3 g/dL — ABNORMAL LOW (ref 3.5–5.0)
Alkaline Phosphatase: 52 U/L (ref 38–126)
Anion gap: 8 (ref 5–15)
BUN: 22 mg/dL (ref 8–23)
CO2: 24 mmol/L (ref 22–32)
Calcium: 9.1 mg/dL (ref 8.9–10.3)
Chloride: 107 mmol/L (ref 98–111)
Creatinine, Ser: 0.91 mg/dL (ref 0.61–1.24)
GFR calc Af Amer: >60 mL/min (ref >60)
GFR calc non Af Amer: >60 mL/min (ref >60)
Glucose, Bld: 86 mg/dL (ref 70–99)
Potassium: 4.5 mmol/L (ref 3.5–5.1)
Sodium: 139 mmol/L (ref 135–145)
Total Bilirubin: 1.1 mg/dL (ref 0.3–1.2)
Total Protein: 5.8 g/dL — ABNORMAL LOW (ref 6.5–8.1)

## 2018-10-15 LAB — URINALYSIS, ROUTINE W REFLEX MICROSCOPIC
Bilirubin Urine: NEGATIVE
Glucose, UA: NEGATIVE mg/dL
Hgb urine dipstick: NEGATIVE
Ketones, ur: NEGATIVE mg/dL
Leukocytes,Ua: NEGATIVE
Nitrite: NEGATIVE
Protein, ur: NEGATIVE mg/dL
Specific Gravity, Urine: 1.008 (ref 1.005–1.030)
pH: 7 (ref 5.0–8.0)

## 2018-10-15 LAB — MAGNESIUM: Magnesium: 2.2 mg/dL (ref 1.7–2.4)

## 2018-10-15 LAB — CBC
HCT: 44 % (ref 39.0–52.0)
Hemoglobin: 14.2 g/dL (ref 13.0–17.0)
MCH: 29.5 pg (ref 26.0–34.0)
MCHC: 32.3 g/dL (ref 30.0–36.0)
MCV: 91.3 fL (ref 80.0–100.0)
Platelets: 221 10*3/uL (ref 150–400)
RBC: 4.82 MIL/uL (ref 4.22–5.81)
RDW: 12.9 % (ref 11.5–15.5)
WBC: 6.8 10*3/uL (ref 4.0–10.5)
nRBC: 0 % (ref 0.0–0.2)

## 2018-10-15 LAB — TROPONIN I: Troponin I: <0.03 ng/mL (ref <0.03)

## 2018-10-15 MED ORDER — FENTANYL CITRATE (PF) 100 MCG/2ML IJ SOLN
INTRAMUSCULAR | Status: AC | PRN
  Administered 2018-10-15: 50 ug via INTRAVENOUS

## 2018-10-15 MED ORDER — MIDAZOLAM HCL 2 MG/2ML IJ SOLN
INTRAMUSCULAR | Status: AC | PRN
  Administered 2018-10-15: 2 mg via INTRAVENOUS

## 2018-10-15 MED ORDER — MIDAZOLAM HCL 2 MG/2ML IJ SOLN
4.0000 mg | Freq: Once | INTRAMUSCULAR | Status: AC
  Administered 2018-10-15: 4 mg via INTRAVENOUS
  Filled 2018-10-15: qty 4

## 2018-10-15 MED ORDER — PHENYLEPHRINE 40 MCG/ML (10ML) SYRINGE FOR IV PUSH (FOR BLOOD PRESSURE SUPPORT)
PREFILLED_SYRINGE | INTRAVENOUS | Status: AC
  Filled 2018-10-15: qty 10

## 2018-10-15 MED ORDER — FENTANYL CITRATE (PF) 100 MCG/2ML IJ SOLN
INTRAMUSCULAR | Status: AC
  Filled 2018-10-15: qty 2

## 2018-10-15 MED ORDER — AMIODARONE IV BOLUS ONLY 150 MG/100ML
150.0000 mg | Freq: Once | INTRAVENOUS | Status: AC
  Administered 2018-10-15: 150 mg via INTRAVENOUS
  Filled 2018-10-15: qty 100

## 2018-10-15 MED ORDER — SODIUM CHLORIDE 0.9 % IV BOLUS
250.0000 mL | Freq: Once | INTRAVENOUS | Status: AC
  Administered 2018-10-15: 250 mL via INTRAVENOUS

## 2018-10-15 MED ORDER — AMIODARONE HCL 200 MG PO TABS: 200.0000 mg | ORAL_TABLET | Freq: Two times a day (BID) | ORAL | 1 refills | Status: DC

## 2018-10-15 MED ORDER — AMIODARONE HCL 200 MG PO TABS: 200.0000 mg | ORAL_TABLET | Freq: Two times a day (BID) | ORAL | Status: DC

## 2018-10-15 MED ORDER — FENTANYL CITRATE (PF) 100 MCG/2ML IJ SOLN
100.0000 ug | Freq: Once | INTRAMUSCULAR | Status: AC
  Administered 2018-10-15: 50 ug via INTRAVENOUS
  Filled 2018-10-15: qty 2

## 2018-10-15 MED ORDER — FENTANYL CITRATE (PF) 100 MCG/2ML IJ SOLN: 50.0000 ug | Freq: Once | INTRAMUSCULAR | Status: DC

## 2018-10-15 MED ORDER — FLUMAZENIL 0.5 MG/5ML IV SOLN
INTRAVENOUS | Status: AC
  Filled 2018-10-15: qty 5

## 2018-10-15 MED ORDER — MIDAZOLAM HCL 2 MG/2ML IJ SOLN
INTRAMUSCULAR | Status: AC
  Filled 2018-10-15: qty 2

## 2018-10-15 NOTE — ED Provider Notes (Signed)
[facility] EMERGENCY DEPARTMENT Provider Note   CSN: [id number] Arrival date & time: 10/15/18  0911    History   Chief Complaint Chief Complaint  Patient presents with  . Atrial Fibrillation    HPI Jerry Barr is a 78 y.o. male.     HPI   Patient is a 78 year old male with a past medical history of coronary artery disease status post DES to the LAD 9 years ago, PAF (on amiodarone and Xarelto), orthostatic hypotension, currently on midodrine, presenting for generalized weakness, irregular heart rate, and episode of chest pain yesterday.  Patient reports he called his cardiology office and spoke with the nurse practitioners when he recommended transfer to the emergency department.  Patient reports that for the past approximately 36 hours he is experienced generalized weakness with exertion.  He is also noticed that his Fitbit recorded his heart rate in the 120s.  At rest it is lower.  Patient also reports that he chronically has fatigue in the morning due to his orthostatic hypotension which is well controlled on drinking "salt water" and midodrine, however he has felt more fatigued throughout the day over the past 2 days.  He reports some increased fatigue going back to 1 week from now, however midweek it did improve.  He denies any fevers, chills, diaphoresis, shortness of breath, or cough.  No recent sick contacts. No nausea, vomiting, or abdominal pain.  Patient reports that yesterday in the afternoon he had an episode of substernal chest pressure, nonradiating, that is resolved currently.  It occurred at rest.  No chest pain or shortness of breath with exertion.  Last episode of atrial fibrillation at the end of 2018 resulted in admission with ultimate cardioversion.   Past Medical History:  Diagnosis Date  . Allergy   . BPH (benign prostatic hyperplasia)   . Cancer (HCC)    basal cell ca - face, nose and right leg  . Clotting disorder (HCC)    a fib hx -  Xarelto  . Coronary artery disease    Promus drug-eluting stent to a 99% mid LAD, 40-50% narrowing in the distal LAD with mild to moderate disease in the circ and RCA  . Dyslipidemia   . ED (erectile dysfunction)   . Hyperplastic colon polyp   . Hypoglycemia     Patient Active Problem List   Diagnosis Date Noted  . Orthostatic hypotension 06/13/2017  . Paroxysmal atrial fibrillation ([place]) 04/20/2016  . Palpitations 04/20/2016  . Opacity of lung on imaging study 04/20/2016  . Atrial fibrillation with RVR ([place]) 04/20/2016  . Chest pain   . Dyslipidemia   . CAD (coronary artery disease), native coronary artery 10/29/2014    Past Surgical History:  Procedure Laterality Date  . CARDIOVERSION N/A 04/23/2016   Procedure: CARDIOVERSION;  Surgeon: [name], MD;  Location: [facility] ENDOSCOPY;  Service: Cardiovascular;  Laterality: N/A;  . COLONOSCOPY    . CORONARY ANGIOPLASTY WITH STENT PLACEMENT    . POLYPECTOMY    . TEE WITHOUT CARDIOVERSION N/A 04/23/2016   Procedure: TRANSESOPHAGEAL ECHOCARDIOGRAM (TEE);  Surgeon: [name], MD;  Location: [facility] ENDOSCOPY;  Service: Cardiovascular;  Laterality: N/A;  . UPPER GASTROINTESTINAL ENDOSCOPY          Home Medications    Prior to Admission medications   Medication Sig Start Date End Date Taking? Authorizing Provider  acetaminophen (TYLENOL) 500 MG tablet Take 500 mg by mouth every 6 (six) hours as needed for headache (pain).  [provider]  amiodarone (PACERONE) 100 MG tablet TAKE 1 TABLET BY MOUTH EVERY DAY 04/24/18   Martinique, Peter M, MD  atorvastatin (LIPITOR) 20 MG tablet Take 1 tablet (20 mg total) by mouth daily. 01/19/18   Martinique, Peter M, MD  Cholecalciferol (VITAMIN D-3) 1000 UNITS CAPS Take 1,000 Units by mouth daily.    [provider]  fluticasone (FLONASE) 50 MCG/ACT nasal spray Place 1 spray into both nostrils daily as needed (seasonal allergies).     [provider]  loratadine (CLARITIN) 10  MG tablet Take 10 mg by mouth daily.     [provider]  midodrine (PROAMATINE) 5 MG tablet Take 5 mg by mouth. Take 1 tablet in the am. 03/15/18   Martinique, Peter M, MD  Wheat Dextrin (BENEFIBER PO) Take 30 mLs by mouth daily.    [provider]  XARELTO 20 MG TABS tablet TAKE 1 TABLET (20 MG TOTAL) BY MOUTH DAILY WITH SUPPER. Patient taking differently: 20 mg daily with breakfast.  05/24/18   Martinique, Peter M, MD    Family History Family History  Problem Relation Age of Onset  . Breast cancer Mother   . Colon cancer Mother   . CAD Father   . Heart attack Paternal Grandmother   . Colon cancer Brother   . Esophageal cancer Brother   . Liver cancer Brother   . Cancer Brother   . Leukemia Paternal Uncle   . Bone cancer Paternal Grandfather   . Pancreatic cancer Neg Hx   . Prostate cancer Neg Hx   . Rectal cancer Neg Hx   . Stomach cancer Neg Hx     Social History Social History   Tobacco Use  . Smoking status: Never Smoker  . Smokeless tobacco: Never Used  Substance Use Topics  . Alcohol use: Yes    Alcohol/week: 5.0 standard drinks    Types: 5 Glasses of wine per week    Comment: red wine  . Drug use: No     Allergies   Penicillins   Review of Systems Review of Systems  Constitutional: Positive for fatigue. Negative for chills and fever.  HENT: Negative for congestion, rhinorrhea, sinus pain and sore throat.   Eyes: Negative for visual disturbance.  Respiratory: Positive for chest tightness. Negative for cough and shortness of breath.   Cardiovascular: Positive for palpitations. Negative for chest pain and leg swelling.  Gastrointestinal: Negative for abdominal pain, diarrhea, nausea and vomiting.  Genitourinary: Negative for dysuria and flank pain.  Musculoskeletal: Negative for myalgias.  Skin: Negative for rash.  Neurological: Positive for light-headedness. Negative for dizziness, syncope and headaches.     Physical Exam Updated Vital Signs  BP 116/88   Pulse 92   Temp 97.6 F (36.4 C) (Oral)   Resp 12   SpO2 99%   Physical Exam Vitals signs and nursing note reviewed.  Constitutional:      General: He is not in acute distress.    Appearance: He is well-developed.  HENT:     Head: Normocephalic and atraumatic.  Eyes:     Conjunctiva/sclera: Conjunctivae normal.     Pupils: Pupils are equal, round, and reactive to light.  Neck:     Musculoskeletal: Normal range of motion and neck supple.  Cardiovascular:     Rate and Rhythm: Normal rate. Rhythm irregular.     Heart sounds: S1 normal and S2 normal. No murmur.     Comments: Varied rate 90-108 on exam.  Pulmonary:  Effort: Pulmonary effort is normal.     Breath sounds: Normal breath sounds. No wheezing or rales.  Abdominal:     General: There is no distension.     Palpations: Abdomen is soft.     Tenderness: There is no abdominal tenderness. There is no guarding.  Musculoskeletal: Normal range of motion.        General: No deformity.  Lymphadenopathy:     Cervical: No cervical adenopathy.  Skin:    General: Skin is warm and dry.     Findings: No erythema or rash.  Neurological:     Mental Status: He is alert.     Comments: Cranial nerves grossly intact. Patient moves extremities symmetrically and with good coordination.  Psychiatric:        Behavior: Behavior normal.        Thought Content: Thought content normal.        Judgment: Judgment normal.      ED Treatments / Results  Labs (all labs ordered are listed, but only abnormal results are displayed) Labs Reviewed  COMPREHENSIVE METABOLIC PANEL - Abnormal; Notable for the following components:      Result Value   Total Protein 5.8 (*)    Albumin 3.3 (*)    All other components within normal limits  URINALYSIS, ROUTINE W REFLEX MICROSCOPIC - Abnormal; Notable for the following components:   Color, Urine STRAW (*)    All other components within normal limits  MAGNESIUM  CBC  TROPONIN I     EKG EKG Interpretation  Date/Time:  Sunday October 15 2018 09:13:28 EDT Ventricular Rate:  75 PR Interval:    QRS Duration: 89 QT Interval:  370 QTC Calculation: 414 R Axis:   59 Text Interpretation:  Atrial fibrillation New since previous tracing Confirmed by Pattricia Boss [phone]) on 10/15/2018 9:43:42 AM   EKG Interpretation  Date/Time:  Sunday October 15 2018 12:12:15 EDT Ventricular Rate:  81 PR Interval:    QRS Duration: 88 QT Interval:  375 QTC Calculation: 436 R Axis:   41 Text Interpretation:  Sinus rhythm since last tracing atrial fibrillaionon has resolved Confirmed by Pattricia Boss [phone]) on 10/15/2018 12:42:32 PM      Radiology Dg Chest Port 1 View  Result Date: 10/15/2018 CLINICAL DATA:  Chest pain and fatigue EXAM: PORTABLE CHEST 1 VIEW COMPARISON:  06/12/2017 FINDINGS: Normal heart size and mediastinal contours. Coronary stent. No acute infiltrate or edema. No effusion or pneumothorax. No acute osseous findings. IMPRESSION: No evidence of active disease. Electronically Signed   By: Monte Fantasia M.D.   On: 10/15/2018 10:07    Procedures Procedures (including critical care time)  Medications Ordered in ED Medications - No data to display   Initial Impression / Assessment and Plan / ED Course  I have reviewed the triage vital signs and the nursing notes.  Pertinent labs & imaging results that were available during my care of the patient were reviewed by me and considered in my medical decision making (see chart for details).  Clinical Course as of Oct 15 1439  Sun Oct 15, 2018  1050 Spoke with Dr. Harrell Gave who states that at this time, likely medical management is the preference with the reduction in elective cardioversions with COVID-19.  She will come to evaluate the patient and make medication recommendations. May increase amiodarone.    [AM]  1115 Spoke with Mickel Baas, FNP who states that Dr. Tempie Hoist might recommend cardioversion. Will discuss with  attending.    [AM]  1218 Patient recovering mental status, maintaining normal blood pressure after sedation.    [AM]  1303 SBP 95-98. Patient reports still feeling slightly sleepy. Amiodarone given. Will discuss with Dr. Tempie Hoist.   [AM]  [phone] Spoke with FNP Dorene Ar who will order 250 saline bolus on pt due to soft SBP. Will continue to proceed with amiodarone 200mg  PO BID per FNP [place].    [AM]  1331 SBP 107. Pt talking on the phone in NAD.    [AM]  9604 Pt reports feeling much better. Will ambulate in room.    [AM]    Clinical Course User Index [AM] Albesa Seen, PA-C       Patient is nontoxic-appearing, hemodynamically stable here in the emergency department, and in atrial fibrillation.  On my examination, he is not in rapid ventricular response.  Heart rate is fluctuating anywhere between the 80s to 110.  Patient is overall aware of when he is in atrial fibrillation and goes into atrial fibrillation infrequently.  Last time recorded was in December 2018 and he was cardioverted at that time.  Patient has been anticoagulated and has not missed any doses since his colonoscopy 1 year ago.  He denies any infectious symptoms, recent precipitants for hypovolemia such as nausea, vomiting, or diarrhea, or other reason for compensatory rate.  Will check screening labs including troponin given patient's episode of atypical chest pain yesterday.  Low suspicion for pulmonary embolism given no active chest pain or shortness of breath.  Anticipate consultation with cardiology regarding patient's options.  Dr. Haroldine Laws recommending cardioversion given patient's history of orthostatic hypotension.  This was performed successfully.  Patient entered normal sinus rhythm after cardioversion.  Patient had no adverse outcomes from the sedation.  Cardioversion performed per Dr. Haroldine Laws and sedation performed per Dr. Jeanell Sparrow.  Dr. Haroldine Laws further recommended amiodarone IV here in emergency department.  Patient  to increase his oral amiodarone dose to 200 mg twice daily for 1 month.  Cardiology arranging for follow-up for patient.  Patient tolerating p.o. here in the emergency department and in no acute distress.  He ambulated in the room without difficulty, or feeling presyncopal.  Return precautions are given for any further chest pain, shortness of breath, palpitations or weakness.  Patient is in understanding and agrees with plan of care.  This is a shared visit with Dr. Pattricia Boss. Patient was independently evaluated by this attending physician and conscious sedation performed by this provider. Attending physician consulted in evaluation and discharge management.  Final Clinical Impressions(s) / ED Diagnoses   Final diagnoses:  Paroxysmal atrial fibrillation [facility])    ED Discharge Orders         Ordered    amiodarone (PACERONE) 200 MG tablet  2 times daily     10/15/18 1227           Albesa Seen, [place] 10/15/18 1446    Pattricia Boss, MD 10/20/18 1452

## 2018-10-15 NOTE — Consult Note (Addendum)
Cardiology Consultation:   Patient ID: Jerry Barr MRN: [id number]; DOB: [birth year]  Admit date: 10/15/2018 Date of Consult: 10/15/2018  Primary Care Provider: Antony Contras, MD Primary Cardiologist: Peter Martinique, MD  Primary Electrophysiologist:  None    Patient Profile:   Jerry Barr is a 78 y.o. male with a hx of PAF since 2017 on xarelto, CAD s/p DES to LAD in 2011, BPH, HLD, chronic hypotension on Midodrine once daily who is being seen today for the evaluation of a fib symptomatic at the request of Dr. Jeanell Sparrow.  History of Present Illness:   [name] with hx of PAF since 2017 on xarelto, CAD with hx DES to LAD in 2011, BPH, HLD and chronic hypotension on midodrine.  Pt had been doing well until last day or so and he has been more fatigued.  He always has low BP in AM but he takes his midodrine and it improves.  Yesterday with activity his HR increased but at rest it was controlled.  Today he called because even with rest his HR was 120 and BP low.  He had mild chest discomfort as well.   He is on amiodarone 100 mg daily so I had him take another 100 mg to equal 200 mg and due to chest pressure had him come to ER.    EKG:  The EKG was personally reviewed and demonstrates:  A fib with HR 78 and no other acute changes.   Telemetry:  Telemetry was personally reviewed and demonstrates:  A fib   Na 139, K+ 4.5, BUN 22, Cr 0.91 mag 2.2, troponin <0.03  Hgb 14.2 Plts 221 and WBC 6.8   BP is stable at 104/85 to 124/79  With just talking HR up to 124   Past Medical History:  Diagnosis Date  . Allergy   . BPH (benign prostatic hyperplasia)   . Cancer (HCC)    basal cell ca - face, nose and right leg  . Clotting disorder (HCC)    a fib hx - Xarelto  . Coronary artery disease    Promus drug-eluting stent to a 99% mid LAD, 40-50% narrowing in the distal LAD with mild to moderate disease in the circ and RCA  . Dyslipidemia   . ED (erectile dysfunction)   . Hyperplastic colon polyp   .  Hypoglycemia     Past Surgical History:  Procedure Laterality Date  . CARDIOVERSION N/A 04/23/2016   Procedure: CARDIOVERSION;  Surgeon: [name], MD;  Location: [facility] ENDOSCOPY;  Service: Cardiovascular;  Laterality: N/A;  . COLONOSCOPY    . CORONARY ANGIOPLASTY WITH STENT PLACEMENT    . POLYPECTOMY    . TEE WITHOUT CARDIOVERSION N/A 04/23/2016   Procedure: TRANSESOPHAGEAL ECHOCARDIOGRAM (TEE);  Surgeon: [name], MD;  Location: [facility] ENDOSCOPY;  Service: Cardiovascular;  Laterality: N/A;  . UPPER GASTROINTESTINAL ENDOSCOPY       Home Medications:  Prior to Admission medications   Medication Sig Start Date End Date Taking? Authorizing Provider  acetaminophen (TYLENOL) 500 MG tablet Take 500 mg by mouth every 6 (six) hours as needed for headache (pain).    [provider]  amiodarone (PACERONE) 100 MG tablet TAKE 1 TABLET BY MOUTH EVERY DAY 04/24/18   Martinique, Peter M, MD  atorvastatin (LIPITOR) 20 MG tablet Take 1 tablet (20 mg total) by mouth daily. 01/19/18   Martinique, Peter M, MD  Cholecalciferol (VITAMIN D-3) 1000 UNITS CAPS Take 1,000 Units by mouth daily.    [provider]  fluticasone (FLONASE) 50 MCG/ACT nasal spray Place 1 spray into both nostrils daily as needed (seasonal allergies).     [provider]  loratadine (CLARITIN) 10 MG tablet Take 10 mg by mouth daily.     [provider]  midodrine (PROAMATINE) 5 MG tablet Take 5 mg by mouth. Take 1 tablet in the am. 03/15/18   Martinique, Peter M, MD  Wheat Dextrin (BENEFIBER PO) Take 30 mLs by mouth daily.    [provider]  XARELTO 20 MG TABS tablet TAKE 1 TABLET (20 MG TOTAL) BY MOUTH DAILY WITH SUPPER. Patient taking differently: 20 mg daily with breakfast.  05/24/18   Martinique, Peter M, MD    Inpatient Medications: Scheduled Meds:  Continuous Infusions: . sodium chloride     PRN Meds:   Allergies:    Allergies  Allergen Reactions  . Penicillins Rash    Pt has had other  "cillins" since minor reaction 50 years ago with no reaction. Has patient had a PCN reaction causing immediate rash, facial/tongue/throat swelling, SOB or lightheadedness with hypotension: Yes Has patient had a PCN reaction causing severe rash involving mucus membranes or skin necrosis: No Has patient had a PCN reaction that required hospitalization: No Has patient had a PCN reaction occurring within the last 10 years: No If all of the above answers are "NO", then may proceed with     Social History:   Social History   Socioeconomic History  . Marital status: Married    Spouse name: Not on file  . Number of children: Not on file  . Years of education: Not on file  . Highest education level: Not on file  Occupational History  . Not on file  Social Needs  . Financial resource strain: Not on file  . Food insecurity:    Worry: Not on file    Inability: Not on file  . Transportation needs:    Medical: Not on file    Non-medical: Not on file  Tobacco Use  . Smoking status: Never Smoker  . Smokeless tobacco: Never Used  Substance and Sexual Activity  . Alcohol use: Yes    Alcohol/week: 5.0 standard drinks    Types: 5 Glasses of wine per week    Comment: red wine  . Drug use: No  . Sexual activity: Not on file  Lifestyle  . Physical activity:    Days per week: Not on file    Minutes per session: Not on file  . Stress: Not on file  Relationships  . Social connections:    Talks on phone: Not on file    Gets together: Not on file    Attends religious service: Not on file    Active member of club or organization: Not on file    Attends meetings of clubs or organizations: Not on file    Relationship status: Not on file  . Intimate partner violence:    Fear of current or ex partner: Not on file    Emotionally abused: Not on file    Physically abused: Not on file    Forced sexual activity: Not on file  Other Topics Concern  . Not on file  Social History Narrative  . Not on  file    Family History:    Family History  Problem Relation Age of Onset  . Breast cancer Mother   . Colon cancer Mother   . CAD Father   . Heart attack Paternal Grandmother   . Colon  cancer Brother   . Esophageal cancer Brother   . Liver cancer Brother   . Cancer Brother   . Leukemia Paternal Uncle   . Bone cancer Paternal Grandfather   . Pancreatic cancer Neg Hx   . Prostate cancer Neg Hx   . Rectal cancer Neg Hx   . Stomach cancer Neg Hx      ROS:  Please see the history of present illness.  General:no colds or fevers, no weight changes Skin:no rashes or ulcers HEENT:no blurred vision, no congestion CV:see HPI PUL:see HPI GI:no diarrhea constipation or melena, no indigestion GU:no hematuria, no dysuria MS:no joint pain, no claudication Neuro:no syncope, no lightheadedness Endo:no diabetes, no thyroid disease  All other ROS reviewed and negative.     Physical Exam/Data:   Vitals:   10/15/18 1015 10/15/18 1030 10/15/18 1045 10/15/18 1100  BP: 120/82 123/81 111/83 124/79  Pulse: 85 82 86 80  Resp: 19 20 20 19   Temp:      TempSrc:      SpO2: 99% 98% 98% 98%   No intake or output data in the 24 hours ending 10/15/18 1117 Last 3 Weights 07/26/2018 01/19/2018 09/29/2017  Weight (lbs) 155 lb 12.8 oz 156 lb 3.2 oz 157 lb  Weight (kg) 70.67 kg 70.852 kg 71.215 kg     There is no height or weight on file to calculate BMI.  General:  Elderly Well nourished, well developed, in no acute distress HEENT: normal Neck: supple. no JVD. Carotids 2+ bilat; no bruits. No lymphadenopathy or thryomegaly appreciated. Cor: PMI nondisplaced. Irregular rate & rhythm. No rubs, gallops or murmurs. Lungs: clear Abdomen: soft, nontender, nondistended. No hepatosplenomegaly. No bruits or masses. Good bowel sounds. Extremities: no cyanosis, clubbing, rash, edema Neuro: alert & orientedx3, cranial nerves grossly intact. moves all 4 extremities w/o difficulty. Affect pleasant   Relevant  CV Studies: Echo 06/2017  Study Conclusions  - Left ventricle: The cavity size was normal. Systolic function was   mildly reduced. The estimated ejection fraction was in the range   of 45% to 50%. Mild diffuse hypokinesis. Left ventricular   diastolic function parameters were normal. - Aortic valve: Transvalvular velocity was within the normal range.   There was no stenosis. There was trivial regurgitation. - Mitral valve: There was trivial regurgitation. - Left atrium: The atrium was moderately dilated. - Right ventricle: The cavity size was normal. Wall thickness was   normal. Systolic function was normal. - Right atrium: The atrium was severely dilated. - Atrial septum: No defect or patent foramen ovale was identified   by color flow Doppler. - Tricuspid valve: There was trivial regurgitation. - Pulmonary arteries: Systolic pressure was within the normal   range. PA peak pressure: 24 mm Hg (S).   Myoview 06/13/2017  Blood pressure demonstrated a normal response to exercise.  Clinically negative, electrically positive with EKG changes in Stage III. Good exercise tolerance  Inferior, inferolateral , inferoseptal thinning with no significant change in the recovery images consistent with probable soft tissue attenuation (diaphragm) No significant ischemia or scar.  Nuclear stress EF: 55%.  Overall low risk scan   Laboratory Data:  Chemistry Recent Labs  Lab 10/15/18 0915  NA 139  K 4.5  CL 107  CO2 24  GLUCOSE 86  BUN 22  CREATININE 0.91  CALCIUM 9.1  GFRNONAA >60  GFRAA >60  ANIONGAP 8    Recent Labs  Lab 10/15/18 0915  PROT 5.8*  ALBUMIN 3.3*  AST 28  ALT 38  ALKPHOS 52  BILITOT 1.1   Hematology Recent Labs  Lab 10/15/18 0915  WBC 6.8  RBC 4.82  HGB 14.2  HCT 44.0  MCV 91.3  MCH 29.5  MCHC 32.3  RDW 12.9  PLT 221   Cardiac Enzymes Recent Labs  Lab 10/15/18 0915  TROPONINI <0.03   No results for input(s): TROPIPOC in the last 168  hours.  BNPNo results for input(s): BNP, PROBNP in the last 168 hours.  DDimer No results for input(s): DDIMER in the last 168 hours.  Radiology/Studies:  Dg Chest Port 1 View  Result Date: 10/15/2018 CLINICAL DATA:  Chest pain and fatigue EXAM: PORTABLE CHEST 1 VIEW COMPARISON:  06/12/2017 FINDINGS: Normal heart size and mediastinal contours. Coronary stent. No acute infiltrate or edema. No effusion or pneumothorax. No acute osseous findings. IMPRESSION: No evidence of active disease. Electronically Signed   By: Monte Fantasia M.D.   On: 10/15/2018 10:07    Assessment and Plan:   1. A fib with RVR with any activity, and hx of PAF.  On xarelto and has not missed any doses.  With talking his HR increases to 124.  Mild chest discomfort but neg troponin.  All due to a fib and HR.   Plan for DCCV here and increase amiodarone to 200 mg daily.  He has taken 200 mg today.  then plan for discharge from ER.  Dr. Haroldine Laws to see and DCCV 2. CAD with hx of DES stent, neg troponin and other than a fib stable EKG  Last myoview in 2018 with no ischemia  3.  Chronic hypotension on midodrine once daily, prior hx of syncope due to hypotension but this has been stable now. 4. HLD on statin continue     For questions or updates, please contact [place] Please consult [email or web address] for contact info under     Signed, Cecilie Kicks, NP  10/15/2018 11:17 AM   Patient seen and examined with the above-signed Advanced Practice Provider and/or Housestaff. I personally reviewed laboratory data, imaging studies and relevant notes. I independently examined the patient and formulated the important aspects of the plan. I have edited the note to reflect any of my changes or salient points. I have personally discussed the plan with the patient and/or family.  78 y/o male with CAD, orthostatic hypotension and PAF (on Xarelto) maintained in NSR on amio 100 daily. Presents to ER with recurrent symptomatic AF with RVR  despite taking extra amiodarone.   Currently HRs in 110-120 range  On exam NAD JVP 7-8 Cor IRR tachy Lungs CTA Ab soft NT Ext warm no edema   He remains with RVR. He has not missed any doses of Xarelto recently. Will plan DC-CV in ER. (D/w Dr. Jeanell Sparrow). Give 150 IV amio load and increase amio to 200 bid for at least 4 weeks. Given orthostatic hypotension and dysrhythmias, consideration should be given to w/u for TTR amyloid.   Glori Bickers, MD  1:46 PM

## 2018-10-15 NOTE — ED Notes (Signed)
Patient verbalizes understanding of discharge instructions. Opportunity for questioning and answers were provided. Armband removed by staff, pt discharged from ED.  

## 2018-10-15 NOTE — Discharge Instructions (Addendum)

## 2018-10-15 NOTE — ED Provider Notes (Signed)
.  Sedation Date/Time: 10/15/2018 12:00 PM Performed by: Pattricia Boss, MD Authorized by: Pattricia Boss, MD   Consent:    Consent obtained:  Written   Consent given by:  Patient   Risks discussed:  Prolonged hypoxia resulting in organ damage, dysrhythmia, inadequate sedation, nausea, prolonged sedation necessitating reversal, respiratory compromise necessitating ventilatory assistance and intubation and vomiting   Alternatives discussed:  Analgesia without sedation Universal protocol:    Procedure explained and questions answered to patient or proxy's satisfaction: yes     Relevant documents present and verified: yes     Test results available and properly labeled: yes     Imaging studies available: yes     Immediately prior to procedure a time out was called: yes   Indications:    Procedure performed:  Cardioversion Pre-sedation assessment:    Time since last food or drink:  5   ASA classification: class 2 - patient with mild systemic disease     Neck mobility: normal     Mouth opening:  3 or more finger widths   Thyromental distance:  3 finger widths   Mallampati score:  I - soft palate, uvula, fauces, pillars visible   Pre-sedation assessments completed and reviewed: airway patency, hydration status, mental status and respiratory function     Pre-sedation assessments completed and reviewed: nausea/vomiting not reviewed and pain level not reviewed     Pre-sedation assessment completed:  10/15/2018 12:00 PM Immediate pre-procedure details:    Reassessment: Patient reassessed immediately prior to procedure     Reviewed: vital signs, relevant labs/tests and NPO status     Verified: bag valve mask available, emergency equipment available, intubation equipment available, IV patency confirmed, oxygen available, reversal medications available and suction available   Procedure details (see MAR for exact dosages):    Preoxygenation:  Nasal cannula   Sedation:  Midazolam   Analgesia:  Fentanyl   Intra-procedure monitoring:  Continuous capnometry, blood pressure monitoring, cardiac monitor, continuous pulse oximetry and frequent LOC assessments   Intra-procedure events: none     Total Provider sedation time (minutes):  20 Post-procedure details:    Post-sedation assessment completed:  10/15/2018 12:20 PM   Attendance: Constant attendance by certified staff until patient recovered     Recovery: Patient returned to pre-procedure baseline     Post-sedation assessments completed and reviewed: airway patency, cardiovascular function, hydration status, mental status and respiratory function     Post-sedation assessments completed and reviewed: nausea/vomiting not reviewed and pain level not reviewed     Patient is stable for discharge or admission: yes     Patient tolerance:  Tolerated well, no immediate complications      Pattricia Boss, MD 10/15/18 1221

## 2018-10-15 NOTE — ED Triage Notes (Signed)
Pt arrives via [facility]. Patient with history of afib that has been well controlled by amiodarone. Over the past few days he has had increased fatigue and palpitations with some "chest pressure" yesterday. He also has history of hypotension typically in the mornings upon waking that he treats with milrinone and "salt water." patient spoke with his cardiology PA who advised him to take an extra dose of amio which he did this morning. Vital signs with ems stable:bp 132/82, HR controlled in the 70s-80s. Pt a/ox4, resp e/u, nad.

## 2018-10-15 NOTE — ED Notes (Signed)
Pt given turkey sandwich and water

## 2018-10-15 NOTE — CV Procedure (Signed)
    DIRECT CURRENT CARDIOVERSION  NAME:  Jerry Barr   MRN: [id number] DOB:  Sep 11, 1940   ADMIT DATE: 10/15/2018   INDICATIONS: Atrial fibrillation    PROCEDURE:   Informed consent was obtained prior to the procedure. The risks, benefits and alternatives for the procedure were discussed and the patient comprehended these risks. Once an appropriate time out was taken, the patient had the defibrillator pads placed in the anterior and posterior position. The patient then underwent sedation by Dr. Jeanell Sparrow. Once an appropriate level of sedation was achieved, the patient received a single biphasic, synchronized 150J shock with prompt conversion to sinus rhythm. No apparent complications.  Glori Bickers, MD  1:47 PM

## 2018-10-15 NOTE — ED Notes (Signed)
ED Provider at bedside. 

## 2018-10-15 NOTE — Telephone Encounter (Signed)
Pt called with mild chest pain and his chronic hypotension on midodrine once daily but also a fib with HR up to 120 since yesterday.  With sitting HR would be in 70s but with movement would be higher.  This AM even at rest HR 120.  He is on amiodarone 100 mg I asked him to take total of 200 mg this AM but to come to ER for eval.  He has not had atrial fib n 2 years

## 2018-10-15 NOTE — ED Notes (Signed)
100 mcg total of Fentanyl given to pt in 50 mcg increments.

## 2018-10-16 ENCOUNTER — Ambulatory Visit (HOSPITAL_COMMUNITY)
Admission: RE | Admit: 2018-10-16 | Discharge: 2018-10-16 | Disposition: A | Discharge status: Home / Self Care | Payer: Medicare Other | Source: Ambulatory Visit | Attending: Physician Assistant | Admitting: Physician Assistant

## 2018-10-16 ENCOUNTER — Other Ambulatory Visit: Payer: Self-pay

## 2018-10-16 ENCOUNTER — Other Ambulatory Visit (HOSPITAL_COMMUNITY): Payer: Self-pay | Admitting: *Deleted

## 2018-10-16 DIAGNOSIS — I700 Atherosclerosis of aorta: Secondary | ICD-10-CM | POA: Diagnosis not present

## 2018-10-16 DIAGNOSIS — I480 Paroxysmal atrial fibrillation: Secondary | ICD-10-CM | POA: Diagnosis not present

## 2018-10-16 DIAGNOSIS — Z1389 Encounter for screening for other disorder: Secondary | ICD-10-CM | POA: Diagnosis not present

## 2018-10-16 DIAGNOSIS — E7800 Pure hypercholesterolemia, unspecified: Secondary | ICD-10-CM | POA: Diagnosis not present

## 2018-10-16 DIAGNOSIS — I2510 Atherosclerotic heart disease of native coronary artery without angina pectoris: Secondary | ICD-10-CM | POA: Diagnosis not present

## 2018-10-16 DIAGNOSIS — I951 Orthostatic hypotension: Secondary | ICD-10-CM | POA: Diagnosis not present

## 2018-10-16 DIAGNOSIS — J309 Allergic rhinitis, unspecified: Secondary | ICD-10-CM | POA: Diagnosis not present

## 2018-10-16 DIAGNOSIS — Z0000 Encounter for general adult medical examination without abnormal findings: Secondary | ICD-10-CM | POA: Diagnosis not present

## 2018-10-16 DIAGNOSIS — H9190 Unspecified hearing loss, unspecified ear: Secondary | ICD-10-CM | POA: Diagnosis not present

## 2018-10-16 DIAGNOSIS — Z1211 Encounter for screening for malignant neoplasm of colon: Secondary | ICD-10-CM | POA: Diagnosis not present

## 2018-10-16 DIAGNOSIS — Z125 Encounter for screening for malignant neoplasm of prostate: Secondary | ICD-10-CM | POA: Diagnosis not present

## 2018-10-16 DIAGNOSIS — G4752 REM sleep behavior disorder: Secondary | ICD-10-CM | POA: Diagnosis not present

## 2018-10-16 MED ORDER — AMIODARONE HCL 200 MG PO TABS: ORAL_TABLET | ORAL | 1 refills | Status: DC

## 2018-10-16 NOTE — Progress Notes (Signed)
Electrophysiology TeleHealth Note   Due to national recommendations of social distancing due to [place] 19, Audio/video telehealth visit is felt to be most appropriate for this patient at this time.  See consent below from today for patient consent regarding telehealth for the Atrial Fibrillation Clinic. Consent obtained verbally   Date:  10/16/2018   ID:  Jerry Barr, DOB 1940/11/01, MRN [id number]  Location: home  Provider location: [address] [place], [place] [address] Evaluation Performed: New patient consult  PCP:  Antony Contras, MD  Primary Cardiologist:  Dr Martinique  CC: Evaluation of atrial fibrillation   History of Present Illness: Jerry Barr is a 78 y.o. male who presents via audio/video conferencing for a telehealth visit today.   The patient is referred for new consultation regarding paroxysmal atrial fibrillation by [facility]. Patient has a PMH of paroxysmal atrial fibrillation, CAD s/p DES to LAD, BPH, HLD, and chronic hypotension on midodrine. Patient has been maintained on amiodarone with no symptomatic episodes of afib since 2017 until 10/15/18 when patient presented to ER with weakness, irregular heart rate, and chest discomfort. He was found to be in afib with rapid rates on exertion. He underwent successful DCCV and his amiodarone was increased to 200 mg daily. He feels well today with no further symptoms of afib. He denies significant snoring or alcohol intake.  Today, he denies symptoms of palpitations, chest pain, shortness of breath, orthopnea, PND, lower extremity edema, claudication, dizziness, presyncope, syncope, bleeding, or neurologic sequela. The patient is tolerating medications without difficulties and is otherwise without complaint today.   he denies symptoms of cough, fevers, chills, or new SOB worrisome for COVID 19.     Atrial Fibrillation Risk Factors:  he does not have symptoms or diagnosis of sleep apnea. he does not have a history of  alcohol use. The patient does not have a history of early familial atrial fibrillation or other arrhythmias.  he has a BMI of There is no height or weight on file to calculate BMI..  BP 109/60 Provided by patient using home BP machine.  Past Medical History:  Diagnosis Date  . Allergy   . BPH (benign prostatic hyperplasia)   . Cancer (HCC)    basal cell ca - face, nose and right leg  . Clotting disorder (HCC)    a fib hx - Xarelto  . Coronary artery disease    Promus drug-eluting stent to a 99% mid LAD, 40-50% narrowing in the distal LAD with mild to moderate disease in the circ and RCA  . Dyslipidemia   . ED (erectile dysfunction)   . Hyperplastic colon polyp   . Hypoglycemia    Past Surgical History:  Procedure Laterality Date  . CARDIOVERSION N/A 04/23/2016   Procedure: CARDIOVERSION;  Surgeon: [name], MD;  Location: [facility] ENDOSCOPY;  Service: Cardiovascular;  Laterality: N/A;  . COLONOSCOPY    . CORONARY ANGIOPLASTY WITH STENT PLACEMENT    . POLYPECTOMY    . TEE WITHOUT CARDIOVERSION N/A 04/23/2016   Procedure: TRANSESOPHAGEAL ECHOCARDIOGRAM (TEE);  Surgeon: [name], MD;  Location: [facility] ENDOSCOPY;  Service: Cardiovascular;  Laterality: N/A;  . UPPER GASTROINTESTINAL ENDOSCOPY       Current Outpatient Medications  Medication Sig Dispense Refill  . acetaminophen (TYLENOL) 500 MG tablet Take 500 mg by mouth every 6 (six) hours as needed for headache (pain).    Marland Kitchen amiodarone (PACERONE) 200 MG tablet Take 1 tablet (200 mg total) by mouth 2 (two)  times daily. 60 tablet 1  . atorvastatin (LIPITOR) 20 MG tablet Take 1 tablet (20 mg total) by mouth daily. 90 tablet 3  . Cholecalciferol (VITAMIN D-3) 1000 UNITS CAPS Take 1,000 Units by mouth daily.    . fluticasone (FLONASE) 50 MCG/ACT nasal spray Place 1 spray into both nostrils daily as needed (seasonal allergies).     . loratadine (CLARITIN) 10 MG tablet Take 10 mg by mouth daily.     . midodrine (PROAMATINE) 5 MG  tablet Take 5 mg by mouth. Take 1 tablet in the am.    . Wheat Dextrin (BENEFIBER PO) Take 30 mLs by mouth daily.    Alveda Reasons 20 MG TABS tablet TAKE 1 TABLET (20 MG TOTAL) BY MOUTH DAILY WITH SUPPER. (Patient taking differently: 20 mg daily with breakfast. ) 90 tablet 1   Current Facility-Administered Medications  Medication Dose Route Frequency Provider Last Rate Last Dose  . 0.9 %  sodium chloride infusion  500 mL Intravenous Once Ladene Artist, MD        Allergies:   Penicillins   Social History:  The patient  reports that he has never smoked. He has never used smokeless tobacco. He reports current alcohol use of about 5.0 standard drinks of alcohol per week. He reports that he does not use drugs.   Family History:  The patient's  family history includes Bone cancer in his paternal grandfather; Breast cancer in his mother; CAD in his father; Cancer in his brother; Colon cancer in his brother and mother; Esophageal cancer in his brother; Heart attack in his paternal grandmother; Leukemia in his paternal uncle; Liver cancer in his brother.    ROS:  Please see the history of present illness.   All other systems are personally reviewed and negative.   Exam: Well appearing, alert and conversant, regular work of breathing,  good skin color  Recent Labs: 10/15/2018: ALT 38; BUN 22; Creatinine, Ser 0.91; Hemoglobin 14.2; Magnesium 2.2; Platelets 221; Potassium 4.5; Sodium 139  personally reviewed    Other studies personally reviewed: Additional studies/ records that were reviewed today include: Epic notes    ASSESSMENT AND PLAN:  1.  Paroxysmal atrial fibrillation S/p DCCV 10/15/18 Patient has done well on amiodarone with no symptoms of over two years. Continue amiodarone 200 mg BID for now. Will consider decreasing to 200 mg daily after one month. Continue Xarelto 20 mg daily No rate control 2/2 chronic hypotension.  This patients CHA2DS2-VASc Score and unadjusted Ischemic Stroke  Rate (% per year) is equal to 3.2 % stroke rate/year from a score of 3  Above score calculated as 1 point each if present [CHF, HTN, DM, Vascular=MI/PAD/Aortic Plaque, Age if 65-74, or Male] Above score calculated as 2 points each if present [Age > 75, or Stroke/TIA/TE]  2. CAD S/p DES to LAD 2011. No anginal symptoms.   COVID screen The patient does not have any symptoms that suggest any further testing/ screening at this time.  Social distancing reinforced today.    Follow-up with afib clinic in 4 weeks with telehealth visit. Patient able to use D[employer] for video conferencing.  Current medicines are reviewed at length with the patient today.   The patient does not have concerns regarding his medicines.  The following changes were made today:  none  Labs/ tests ordered today include:  No orders of the defined types were placed in this encounter.   Patient Risk:  after full review of this patients clinical status,  I feel that they are at moderate risk at this time.   Today, I have spent 22 minutes with the patient with telehealth technology discussing atrial fibrillation, lifestyle modifications, and amiodarone risks and benefits.     Gwenlyn Perking PA-C 10/16/2018 1:23 PM  Afib [facility] [address] [place], [place] [address] [phone]

## 2018-10-18 NOTE — Telephone Encounter (Signed)
Pt went to ER per Nurse's suggestion. He ended up having a CardioVersion. He just called to make sure Dr. Martinique was aware of the procedure the pt had. Pt reports feeling better after the procedure

## 2018-10-25 ENCOUNTER — Other Ambulatory Visit: Payer: Self-pay

## 2018-10-25 ENCOUNTER — Telehealth: Payer: Self-pay | Admitting: Cardiology

## 2018-10-25 MED ORDER — AMIODARONE HCL 200 MG PO TABS: ORAL_TABLET | ORAL | 1 refills | Status: DC

## 2018-10-25 NOTE — Telephone Encounter (Signed)
New Message   Pt c/o medication issue:  1. Name of Medication: 6 mg of melatonin  2. How are you currently taking this medication (dosage and times per day)?   3. Are you having a reaction (difficulty breathing--STAT)?   4. What is your medication issue? Patient is calling because he wants to know if its okay for him to take the melatonin at night since his dosage for the amiodarone has been increased. Please call to discuss.

## 2018-10-25 NOTE — Telephone Encounter (Signed)
PATIENT AWARE NEED  HAVE THIS REVIEWED BY  DR Martinique OR PHARMACIST  WILL CONTACT ONCE RECEIVED ANSWER.

## 2018-10-26 ENCOUNTER — Other Ambulatory Visit: Payer: Self-pay

## 2018-10-26 MED ORDER — AMIODARONE HCL 200 MG PO TABS: ORAL_TABLET | ORAL | 2 refills | Status: DC

## 2018-10-26 NOTE — Telephone Encounter (Signed)
Okay for patient to take melatonin for sleep aid, but will recommend 3mg  dose to start instead of 6mg .  No significant interaction expected with his current medication.

## 2018-10-26 NOTE — Telephone Encounter (Signed)
Patient aware to  Continue with medication as he has been.

## 2018-10-26 NOTE — Telephone Encounter (Signed)
Spoke to patient , instruction given . Patient states he had been taking 6 mg  That had not change , it was recommend by dr Maxwell Caul-( sleep dr) He wanted to know because the amiodarone had been increase. That is why he called in for advise.  RN informed patient will send this info back for review by pharmacist

## 2018-10-26 NOTE — Telephone Encounter (Signed)
Okay to continue as recommended by Dr Maxwell Caul.  Medication list updated as well.

## 2018-10-27 ENCOUNTER — Telehealth: Payer: Self-pay | Admitting: Physician Assistant

## 2018-10-27 NOTE — Telephone Encounter (Signed)
°  Smart phone/Declined MyChart/pre reg complete. 10-27-18 ST

## 2018-10-27 NOTE — Telephone Encounter (Signed)
Follow Up:; ° ° °Returning your call. °

## 2018-10-27 NOTE — Telephone Encounter (Signed)
LVM to pre reg. 10-27-18 ST °

## 2018-10-30 ENCOUNTER — Telehealth (INDEPENDENT_AMBULATORY_CARE_PROVIDER_SITE_OTHER): Payer: Medicare Other | Admitting: Physician Assistant

## 2018-10-30 ENCOUNTER — Encounter: Payer: Self-pay | Admitting: Physician Assistant

## 2018-10-30 VITALS — BP 103/60 | HR 69 | Ht 72.0 in | Wt 145.0 lb

## 2018-10-30 DIAGNOSIS — E785 Hyperlipidemia, unspecified: Secondary | ICD-10-CM

## 2018-10-30 DIAGNOSIS — I25118 Atherosclerotic heart disease of native coronary artery with other forms of angina pectoris: Secondary | ICD-10-CM

## 2018-10-30 DIAGNOSIS — I480 Paroxysmal atrial fibrillation: Secondary | ICD-10-CM

## 2018-10-30 DIAGNOSIS — I951 Orthostatic hypotension: Secondary | ICD-10-CM

## 2018-10-30 DIAGNOSIS — M546 Pain in thoracic spine: Secondary | ICD-10-CM | POA: Diagnosis not present

## 2018-10-30 NOTE — Patient Instructions (Signed)
Medication Instructions:   DECREASE AMIODARONE TO 200 Mg DAILY  If you need a refill on your cardiac medications before your next appointment, please call your pharmacy.   Lab work:  [employer] ORDERED AT THIS TIME OF APPOINTMENT   If you have labs (blood work) drawn today and your tests are completely normal, you will receive your results only by: Marland Kitchen MyChart Message (if you have MyChart) OR . A paper copy in the mail If you have any lab test that is abnormal or we need to change your treatment, we will call you to review the results.  Testing/Procedures:  NONE ORDERED AT THIS TIME OF APPOINTMENT   Follow-Up: At [facility], you and your health needs are our priority.  As part of our continuing mission to provide you with exceptional heart care, we have created designated Provider Care Teams.  These Care Teams include your primary Cardiologist (physician) and Advanced Practice Providers (APPs -  Physician Assistants and Nurse Practitioners) who all work together to provide you with the care you need, when you need it. You will need a follow up appointment in 2 weeks.  Please call our office 2 months in advance to schedule this appointment.  You may see Peter Martinique, MD or one of the following Advanced Practice Providers on your designated Care Team: [place], [place] . Fabian Sharp, PA-C  Any Other Special Instructions Will Be Listed Below (If Applicable).

## 2018-10-30 NOTE — Progress Notes (Signed)
Virtual Visit via Telephone Note   This visit type was conducted due to national recommendations for restrictions regarding the COVID-19 Pandemic (e.g. social distancing) in an effort to limit this patient's exposure and mitigate transmission in our community.  Due to his co-morbid illnesses, this patient is at least at moderate risk for complications without adequate follow up.  This format is felt to be most appropriate for this patient at this time.  The patient did not have access to video technology/had technical difficulties with video requiring transitioning to audio format only (telephone).  All issues noted in this document were discussed and addressed.  No physical exam could be performed with this format.  Please refer to the patient's chart for his  consent to telehealth for [facility].   Evaluation Performed:  Follow-up visit  Date:  10/31/2018   ID:  Jerry Barr, DOB 09/21/1940, MRN [id number]  Patient Location: Home Provider Location: Home  PCP:  Antony Contras, MD  Cardiologist:  Peter Martinique, MD  Electrophysiologist:  None   Chief Complaint:  Post cardioversion  History of Present Illness:    Jerry Barr is a 78 y.o. male with PMH of HLD, chronic hypotension, PAF on Xarelto, CAD s/p DES to LAD 2011 in [place] and BPH. He was diagnosed with new afib in 04/2016. Echo in 04/2016 showed EF 50-55%, no RWMA, mild MR. He had successful TEE DCCV 04/23/2016. He was placed on midodrine for orthostatic hypotension. He is not on any rate control therapy for the same reason. Myoview  In 06/2017 showed EF 55%, no significant ischemia or scar. Echo in 06/2017 EF 45-50%, mild diffuse hypokinesis, PA peak pressure 66mmHg. He was last seen in the office in Jan 2020, he has mild hyperkalemia at the time.   Patient was recently seen in the ED with recurrent afib and fatigue. Given his history of orthostatic hypotension, it was elected to proceed with cardioversion in the ED. This  was performed by Dr. Haroldine Laws. Post cardioversion, his amiodarone was increased to 200mg  BID. Since discharge, he was seen in the afib clinic, it is planned to consider decreasing amiodarone back to 200mg  daily after 1 month if no recurrent afib.   Patient was contacted today via telephone visit.  He says for roughly 3 to 4 days after the cardioversion, he felt better.  However since then, fatigue has returned and also he is having nausea and weight loss of 5 pounds.  It is possible amiodarone is causing some of his symptoms.  I will decrease amiodarone back to 200 mg daily.  For the past 2 weeks, he also mentions a pain between his shoulder blades every time he does a brisk walk.  This is not exacerbated by palpation, body rotation, neck rotation or deep inspiration.  It is only occurring with exertion.  He says this is similar to some of his symptoms prior to the stent placement in 2011, however on the day when he had stent placement, he did have indigestion feeling.  He denies any chest discomfort recently associated with back pain.  I discussed his case with Dr. Martinique, since he had a moderate disease in the left circumflex and RCA, his symptom is likely angina despite the slight difference compared to previous symptom.  Unfortunately, I cannot place him on any antianginal medication due to his orthostatic hypotension.  Cardiac catheterization at this time is less ideal since he is only 2 weeks out from the cardioversion.  He will need to continue  4 weeks of Xarelto before we can safely hold the Xarelto prior to cardiac catheterization.  I discussed the case with my colleague Bunnie Domino, who will see the patient in 2 weeks on a day Dr. Martinique is also available.  If his symptoms still persist at that time, we plan to set the patient up for cardiac catheterization.  The patient does not have symptoms concerning for COVID-19 infection (fever, chills, cough, or new shortness of breath).    Past  Medical History:  Diagnosis Date   Allergy    BPH (benign prostatic hyperplasia)    Cancer (HCC)    basal cell ca - face, nose and right leg   Clotting disorder (HCC)    a fib hx - Xarelto   Coronary artery disease    Promus drug-eluting stent to a 99% mid LAD, 40-50% narrowing in the distal LAD with mild to moderate disease in the circ and RCA   Dyslipidemia    ED (erectile dysfunction)    Hyperplastic colon polyp    Hypoglycemia    Past Surgical History:  Procedure Laterality Date   CARDIOVERSION N/A 04/23/2016   Procedure: CARDIOVERSION;  Surgeon: [name], MD;  Location: [place];  Service: Cardiovascular;  Laterality: N/A;   COLONOSCOPY     CORONARY ANGIOPLASTY WITH STENT PLACEMENT     POLYPECTOMY     TEE WITHOUT CARDIOVERSION N/A 04/23/2016   Procedure: TRANSESOPHAGEAL ECHOCARDIOGRAM (TEE);  Surgeon: [name], MD;  Location: [facility] ENDOSCOPY;  Service: Cardiovascular;  Laterality: N/A;   UPPER GASTROINTESTINAL ENDOSCOPY       Current Meds  Medication Sig   acetaminophen (TYLENOL) 500 MG tablet Take 500 mg by mouth every 6 (six) hours as needed for headache (pain).   amiodarone (PACERONE) 200 MG tablet Take 1 tablet twice a day for 1 month then reduce to once a day. (Patient taking differently: 200 mg daily. Take 1 tablet by mouth daily.)   atorvastatin (LIPITOR) 20 MG tablet Take 1 tablet (20 mg total) by mouth daily.   Cholecalciferol (VITAMIN D-3) 1000 UNITS CAPS Take 1,000 Units by mouth daily.   fluticasone (FLONASE) 50 MCG/ACT nasal spray Place 1 spray into both nostrils daily as needed (seasonal allergies).    Melatonin 3 MG TABS Take 6 mg by mouth every evening.   midodrine (PROAMATINE) 5 MG tablet Take 5 mg by mouth. Take 1 tablet in the am.   Wheat Dextrin (BENEFIBER PO) Take 30 mLs by mouth daily.   XARELTO 20 MG TABS tablet TAKE 1 TABLET (20 MG TOTAL) BY MOUTH DAILY WITH SUPPER. (Patient taking differently: 20 mg daily with  breakfast. )   Current Facility-Administered Medications for the 10/30/18 encounter (Telemedicine) with Almyra Deforest, PA  Medication   0.9 %  sodium chloride infusion     Allergies:   Penicillins   Social History   Tobacco Use   Smoking status: Never Smoker   Smokeless tobacco: Never Used  Substance Use Topics   Alcohol use: Yes    Alcohol/week: 5.0 standard drinks    Types: 5 Glasses of wine per week    Comment: red wine   Drug use: No     Family Hx: The patient's family history includes Bone cancer in his paternal grandfather; Breast cancer in his mother; CAD in his father; Cancer in his brother; Colon cancer in his brother and mother; Esophageal cancer in his brother; Heart attack in his paternal grandmother; Leukemia in his paternal uncle; Liver cancer in  his brother. There is no history of Pancreatic cancer, Prostate cancer, Rectal cancer, or Stomach cancer.  ROS:   Please see the history of present illness.     All other systems reviewed and are negative.   Prior CV studies:   The following studies were reviewed today:  Cath 06/08/2010     Labs/Other Tests and Data Reviewed:    EKG:  An ECG dated 10/15/2018 was personally reviewed today and demonstrated:  Normal sinus rhythm without significant ST-T wave changes.  Recent Labs: 10/15/2018: ALT 38; BUN 22; Creatinine, Ser 0.91; Hemoglobin 14.2; Magnesium 2.2; Platelets 221; Potassium 4.5; Sodium 139   Recent Lipid Panel Lab Results  Component Value Date/Time   CHOL 143 04/19/2018 08:07 AM   TRIG 40 04/19/2018 08:07 AM   HDL 55 04/19/2018 08:07 AM   CHOLHDL 2.6 04/19/2018 08:07 AM   CHOLHDL 3.0 06/29/2016 10:11 AM   LDLCALC 80 04/19/2018 08:07 AM    Wt Readings from Last 3 Encounters:  10/30/18 145 lb (65.8 kg)  07/26/18 155 lb 12.8 oz (70.7 kg)  01/19/18 156 lb 3.2 oz (70.9 kg)     Objective:    Vital Signs:  BP 103/60    Pulse 69    Ht 6' (1.829 m)    Wt 145 lb (65.8 kg)    BMI 19.67 kg/m    VITAL  SIGNS:  reviewed  ASSESSMENT & PLAN:    1. Shoulder blade pain: This discomfort is likely angina as it only triggered by brisk walk.  This has occurred 3 times in the past 2 weeks.  I am unable to start him on any antianginal medication due to his orthostatic hypotension.  Cardiac catheterization this time is less ideal since he is only 2 weeks out from cardioversion.  I plan to set the patient up to return for telehealth visit in 2 weeks with my colleague Beckie Busing on date Dr. Martinique is also available.  If symptoms persist at that time, we would recommend cardiac catheterization.  The case was discussed with Dr. Martinique as well who is agreeable. 2. PAF: Recently underwent cardioversion.  His heart rate while the atrial fibrillation was actually quite well controlled however it was decided to proceed with cardiac catheterization due to his fatigue and orthostatic hypotension.  He is currently on amiodarone and the Xarelto.  Since cardioversion, his fatigue has returned.  I am only able to obtain a EKG today, however he says he checked his carotid pulse which was quite regular.  3. Hyperlipidemia: On Lipitor 20 mg daily.  4. Orthostatic hypotension: He is on daily Midrin which prohibit me from adding any antianginal medications.  5. CAD s/p DES to LAD in 2011: This was done in [place].  See cath report above.  COVID-19 Education: The signs and symptoms of COVID-19 were discussed with the patient and how to seek care for testing (follow up with PCP or arrange E-visit).  The importance of social distancing was discussed today.  Time:   Today, I have spent 20 minutes with the patient with telehealth technology discussing the above problems.     Medication Adjustments/Labs and Tests Ordered: Current medicines are reviewed at length with the patient today.  Concerns regarding medicines are outlined above.   Tests Ordered: No orders of the defined types were placed in this  encounter.   Medication Changes: No orders of the defined types were placed in this encounter.   Disposition:  Follow up in 2 week(s)  Hilbert Corrigan, [place]  10/31/2018 12:00 AM    [facility]

## 2018-11-01 ENCOUNTER — Telehealth: Payer: Self-pay | Admitting: Physician Assistant

## 2018-11-01 NOTE — Telephone Encounter (Signed)
Tough situation. I discussed with Jerry Barr who had a virtual visit with him 2 days ago. He had a DCCV on 4/5. Is on Xarelto. I think he will need a cardiac cath but would ideally not interrupt anticoagulant therapy for 4 weeks post cardioversion. If he should develop rest discomfort we may need to go sooner. If HR is going up he may well be going back into Afib. Keep planned visit unless symptoms progress.  Fannye Myer Martinique MD, [facility]

## 2018-11-01 NOTE — Telephone Encounter (Signed)
Spoke with pt, aware of dr Doug Sou recommendations. He will call if he develops any rest pain or if his symptoms change. Patient aware to keep follow up virtual appt already scheduled.

## 2018-11-01 NOTE — Telephone Encounter (Signed)
New message:    Patient calling to repot that he still having some accuracies ans states that the PA told him to call. Please call patient.

## 2018-11-01 NOTE — Telephone Encounter (Signed)
Spoke with pt, he was calling to report several episodes of back and neck pain with exertion. Patient reports if he is fixing a meal, sweeping the floor or getting ready to go for a walk he gets the discomfort in his neck and back. He also notices his heart rate will go over 100 with activity. He will sit and rest and after his heart rate goes down the pain will go away. He denies rest pain and gets no discomfort walking to the bathroom. He is starting to have indigestion after eating a big meal esp in the evenings but this only occurs after eating a large meal. He denies any SOB. He has a follow up with APP Monday next week but was told to call and report what was going on. Will forward to dr Martinique to review and advise.

## 2018-11-10 NOTE — Telephone Encounter (Signed)
Follow Up:      Returning your call from today. 

## 2018-11-12 NOTE — Progress Notes (Signed)
Virtual Visit via Telephone Note   This visit type was conducted due to national recommendations for restrictions regarding the COVID-19 Pandemic (e.g. social distancing) in an effort to limit this patient's exposure and mitigate transmission in our community.  Due to his co-morbid illnesses, this patient is at least at moderate risk for complications without adequate follow up.  This format is felt to be most appropriate for this patient at this time.  The patient did not have access to video technology/had technical difficulties with video requiring transitioning to audio format only (telephone).  All issues noted in this document were discussed and addressed.  No physical exam could be performed with this format.  Please refer to the patient's chart for his  consent to telehealth for [facility].   Patient has given verbal permission to conduct this visit via virtual appointment and to bill insurance 11/13/2018 1:30 pm      Evaluation Performed:  Follow-up visit  Date:  11/13/2018   ID:  Jerry Barr, DOB [birth year], MRN [id number]  Patient Location: Home Provider Location: Home  PCP:  Antony Contras, MD  Cardiologist:  Peter Martinique, MD  Electrophysiologist:  None   Chief Complaint:  Chest Pain  History of Present Illness:    Jerry Barr is a 78 y.o. male with complicated medical history, to include, chronic systolic heart failure chronic hypotension, PAF on Xarelto, coronary artery disease status post drug-eluting stent to the LAD in 2011 while living in [place], hyperlipidemia.   The patient had a successful TEE cardioversion on 04/23/2016.  Due to hypotension he was placed on midodrine for orthostatic hypotension.  He was not placed on rate control therapy as this could potentially cause worsening hypotension.  Repeat echocardiogram in December 2018 revealed an EF of 45% to 50% with mild diffuse hypokinesis, PA pressure 24 mmHg.  Unfortunately, the patient was  seen in the emergency room with recurrent atrial fib and fatigue.  Given his history of orthostatic hypotension the patient had cardioversion in the ED performed by Dr. Haroldine Laws.  Post cardioversion amiodarone was increased to 200 mg twice daily.    He has been planned to follow in the atrial fib clinic.  Unfortunately, he called our office on 10/30/2018 because he was having nausea weight loss, and amiodarone was decreased back to 200 mg daily.  He also complained of chest pain between his shoulder blades every time he does a brisk walk, it is not exacerbated by palpation rotation neck rotation or deep inspiration.  Only occurring with exertion.  He says they are similar to those symptoms which occurred prior to his stent placement in 2011. The patient was seen by Almyra Deforest., PA who discussed this with his primary cardiologist Dr. Martinique.  He was not placed on any nitrates to avoid worsening hypotension.  Dr. Martinique recommended follow-up to discuss his symptoms today, if they were continuing he was to be scheduled for a cardiac catheterization.  He was to continue Xarelto for 4 weeks before planning cardiac catheterization.  He is feeling some better after decreased dose of amiodarone to 200 mg daily, with nausea completely resolved. He continues to have neck pain and shoulder blade pain with minimal exertion. Mild dyspnea, with HR elevation. HR and symptoms resolve with rest. He used to take walks but has stopped due to his current symptoms.    BP is better with lower dose of amiodarone. He states he drinks salt water when he is feeling as if his BP  is lower. Retakes BP and it normalizes within an hour. He denies bleeding or bruising. He denies significant dizziness or presyncope.    The patient does not have symptoms concerning for COVID-19 infection (fever, chills, cough, or new shortness of breath).    Past Medical History:  Diagnosis Date   Allergy    BPH (benign prostatic hyperplasia)     Cancer (HCC)    basal cell ca - face, nose and right leg   Clotting disorder (HCC)    a fib hx - Xarelto   Coronary artery disease    Promus drug-eluting stent to a 99% mid LAD, 40-50% narrowing in the distal LAD with mild to moderate disease in the circ and RCA   Dyslipidemia    ED (erectile dysfunction)    Hyperplastic colon polyp    Hypoglycemia    Past Surgical History:  Procedure Laterality Date   CARDIOVERSION N/A 04/23/2016   Procedure: CARDIOVERSION;  Surgeon: [name], MD;  Location: [place];  Service: Cardiovascular;  Laterality: N/A;   COLONOSCOPY     CORONARY ANGIOPLASTY WITH STENT PLACEMENT     POLYPECTOMY     TEE WITHOUT CARDIOVERSION N/A 04/23/2016   Procedure: TRANSESOPHAGEAL ECHOCARDIOGRAM (TEE);  Surgeon: [name], MD;  Location: [facility] ENDOSCOPY;  Service: Cardiovascular;  Laterality: N/A;   UPPER GASTROINTESTINAL ENDOSCOPY       Current Meds  Medication Sig   acetaminophen (TYLENOL) 500 MG tablet Take 500 mg by mouth every 6 (six) hours as needed for headache (pain).   amiodarone (PACERONE) 200 MG tablet Take 1 tablet twice a day for 1 month then reduce to once a day. (Patient taking differently: 200 mg daily. Take 1 tablet by mouth daily.)   atorvastatin (LIPITOR) 20 MG tablet Take 1 tablet (20 mg total) by mouth daily.   Cholecalciferol (VITAMIN D-3) 1000 UNITS CAPS Take 1,000 Units by mouth daily.   fluticasone (FLONASE) 50 MCG/ACT nasal spray Place 1 spray into both nostrils daily as needed (seasonal allergies).    loratadine (CLARITIN) 10 MG tablet Take 10 mg by mouth as needed for allergies. Take 1 tablet daily as needed for seasonal allergies   Melatonin 3 MG TABS Take 6 mg by mouth every evening.   midodrine (PROAMATINE) 5 MG tablet Take 5 mg by mouth. Take 1 tablet in the am.   Wheat Dextrin (BENEFIBER PO) Take 30 mLs by mouth daily.   XARELTO 20 MG TABS tablet TAKE 1 TABLET (20 MG TOTAL) BY MOUTH DAILY WITH SUPPER.  (Patient taking differently: 20 mg daily with breakfast. )   Current Facility-Administered Medications for the 11/13/18 encounter (Telemedicine) with Lendon Colonel, NP  Medication   0.9 %  sodium chloride infusion     Allergies:   Penicillins   Social History   Tobacco Use   Smoking status: Never Smoker   Smokeless tobacco: Never Used  Substance Use Topics   Alcohol use: Yes    Alcohol/week: 5.0 standard drinks    Types: 5 Glasses of wine per week    Comment: red wine   Drug use: No     Family Hx: The patient's family history includes Bone cancer in his paternal grandfather; Breast cancer in his mother; CAD in his father; Cancer in his brother; Colon cancer in his brother and mother; Esophageal cancer in his brother; Heart attack in his paternal grandmother; Leukemia in his paternal uncle; Liver cancer in his brother. There is no history of Pancreatic cancer, Prostate cancer,  Rectal cancer, or Stomach cancer.  ROS:   Please see the history of present illness.    All other systems reviewed and are negative.   Prior CV studies:   The following studies were reviewed today: Cath 06/08/2010    Echocardiogram 06/13/2017 Study Conclusions  - Left ventricle: The cavity size was normal. Systolic function was   mildly reduced. The estimated ejection fraction was in the range   of 45% to 50%. Mild diffuse hypokinesis. Left ventricular   diastolic function parameters were normal. - Aortic valve: Transvalvular velocity was within the normal range.   There was no stenosis. There was trivial regurgitation. - Mitral valve: There was trivial regurgitation. - Left atrium: The atrium was moderately dilated. - Right ventricle: The cavity size was normal. Wall thickness was   normal. Systolic function was normal. - Right atrium: The atrium was severely dilated. - Atrial septum: No defect or patent foramen ovale was identified   by color flow Doppler. - Tricuspid valve: There  was trivial regurgitation. - Pulmonary arteries: Systolic pressure was within the normal   range. PA peak pressure: 24 mm Hg (S).  Labs/Other Tests and Data Reviewed:    EKG:  No ECG reviewed.  Recent Labs: 10/15/2018: ALT 38; BUN 22; Creatinine, Ser 0.91; Hemoglobin 14.2; Magnesium 2.2; Platelets 221; Potassium 4.5; Sodium 139   Recent Lipid Panel Lab Results  Component Value Date/Time   CHOL 143 04/19/2018 08:07 AM   TRIG 40 04/19/2018 08:07 AM   HDL 55 04/19/2018 08:07 AM   CHOLHDL 2.6 04/19/2018 08:07 AM   CHOLHDL 3.0 06/29/2016 10:11 AM   LDLCALC 80 04/19/2018 08:07 AM    Wt Readings from Last 3 Encounters:  11/13/18 143 lb (64.9 kg)  10/30/18 145 lb (65.8 kg)  07/26/18 155 lb 12.8 oz (70.7 kg)     Objective:    Vital Signs:  BP 119/70 (BP Location: Left Arm)    Pulse 63    Ht 6' (1.829 m)    Wt 143 lb (64.9 kg)    BMI 19.39 kg/m    VITAL SIGNS:  reviewed GEN:  no acute distress RESPIRATORY:  normal respiratory effort, symmetric expansion PSYCH:  normal affect  ASSESSMENT & PLAN:    1.Stable Angina: Continues to have shoulder and neck pain with mild dyspnea during minimal activities such as cooking or low level walking. He has not been out walking since he began to have more symptoms, which are reminscent of his angina pain prior to stent to the LAD.              Per Dr. Martinique, he will be scheduled for cardiac cath for definitive evaluation of       coronary anatomy and progression of CAD. This is scheduled for May 6, at 11:30 am. He will hold Xarelto for 24 hours prior to the procedure. Labs will be drawn this week prior to the cath.   2. CAD: Hx of stent to the LAD as above in [place], [place] in 2011, due to high grade stenosis. He had moderate 50%-70% stenosis to the left circumflex just after the first marginal and another 50% narrowing at the origin of the PDA. He remains on atorvastatin, 20 mg daily, he is not on ASA due to DOAC.   3. Atrial fib: Heart rate is  controlled per home BP cuff on amiodarone 200 mg daily. Nausea is resolved with decreased dose from 200 mg BID to 200 mg daily. He  Continues on  Xarelto 20 mg daily without bleeding or bruising.   4. Hypercholesterolemia: Continue on Lipitor 20 mg daily. Will likely need higher dose if stent is placed. Will need to check LFT as he is on amiodarone if statin dose is increased.   5. Orthostatic Hypotension: On midodrine 5 mg in the am.   The patient understands that risks include but are not limited to stroke (1 in 1000), death (1 in 1000), kidney failure [usually temporary] (1 in 500), bleeding (1 in 200), allergic reaction [possibly serious] (1 in 200), and agrees to proceed.     COVID-19 Education: The signs and symptoms of COVID-19 were discussed with the patient and how to seek care for testing (follow up with PCP or arrange E-visit).  The importance of social distancing was discussed today.  Time:   Today, I have spent 25 minutes with the patient with telehealth technology discussing the above problems. Answered multiple questions and discussed cardiac cath procedure and recovery.    Medication Adjustments/Labs and Tests Ordered: Current medicines are reviewed at length with the patient today.  Concerns regarding medicines are outlined above.   Tests Ordered: No orders of the defined types were placed in this encounter.   Medication Changes: No orders of the defined types were placed in this encounter.   Disposition:  Follow up one month after cardiac cath   SignedPhill Myron. West Pugh, ANP, AACC  11/13/2018 2:09 PM      [facility]

## 2018-11-12 NOTE — H&P (View-Only) (Signed)
Virtual Visit via Telephone Note   This visit type was conducted due to national recommendations for restrictions regarding the COVID-19 Pandemic (e.g. social distancing) in an effort to limit this patient's exposure and mitigate transmission in our community.  Due to his co-morbid illnesses, this patient is at least at moderate risk for complications without adequate follow up.  This format is felt to be most appropriate for this patient at this time.  The patient did not have access to video technology/had technical difficulties with video requiring transitioning to audio format only (telephone).  All issues noted in this document were discussed and addressed.  No physical exam could be performed with this format.  Please refer to the patient's chart for his  consent to telehealth for [facility].   Patient has given verbal permission to conduct this visit via virtual appointment and to bill insurance 11/13/2018 1:30 pm      Evaluation Performed:  Follow-up visit  Date:  11/13/2018   ID:  Jerry Barr, DOB 25-Apr-1941, MRN [id number]  Patient Location: Home Provider Location: Home  PCP:  Antony Contras, MD  Cardiologist:  Peter Martinique, MD  Electrophysiologist:  None   Chief Complaint:  Chest Pain  History of Present Illness:    Macalister Arnaud is a 78 y.o. male with complicated medical history, to include, chronic systolic heart failure chronic hypotension, PAF on Xarelto, coronary artery disease status post drug-eluting stent to the LAD in 2011 while living in [place], hyperlipidemia.   The patient had a successful TEE cardioversion on 04/23/2016.  Due to hypotension he was placed on midodrine for orthostatic hypotension.  He was not placed on rate control therapy as this could potentially cause worsening hypotension.  Repeat echocardiogram in December 2018 revealed an EF of 45% to 50% with mild diffuse hypokinesis, PA pressure 24 mmHg.  Unfortunately, the patient was  seen in the emergency room with recurrent atrial fib and fatigue.  Given his history of orthostatic hypotension the patient had cardioversion in the ED performed by Dr. Haroldine Laws.  Post cardioversion amiodarone was increased to 200 mg twice daily.    He has been planned to follow in the atrial fib clinic.  Unfortunately, he called our office on 10/30/2018 because he was having nausea weight loss, and amiodarone was decreased back to 200 mg daily.  He also complained of chest pain between his shoulder blades every time he does a brisk walk, it is not exacerbated by palpation rotation neck rotation or deep inspiration.  Only occurring with exertion.  He says they are similar to those symptoms which occurred prior to his stent placement in 2011. The patient was seen by Almyra Deforest., PA who discussed this with his primary cardiologist Dr. Martinique.  He was not placed on any nitrates to avoid worsening hypotension.  Dr. Martinique recommended follow-up to discuss his symptoms today, if they were continuing he was to be scheduled for a cardiac catheterization.  He was to continue Xarelto for 4 weeks before planning cardiac catheterization.  He is feeling some better after decreased dose of amiodarone to 200 mg daily, with nausea completely resolved. He continues to have neck pain and shoulder blade pain with minimal exertion. Mild dyspnea, with HR elevation. HR and symptoms resolve with rest. He used to take walks but has stopped due to his current symptoms.    BP is better with lower dose of amiodarone. He states he drinks salt water when he is feeling as if his BP  is lower. Retakes BP and it normalizes within an hour. He denies bleeding or bruising. He denies significant dizziness or presyncope.    The patient does not have symptoms concerning for COVID-19 infection (fever, chills, cough, or new shortness of breath).    Past Medical History:  Diagnosis Date   Allergy    BPH (benign prostatic hyperplasia)     Cancer (HCC)    basal cell ca - face, nose and right leg   Clotting disorder (HCC)    a fib hx - Xarelto   Coronary artery disease    Promus drug-eluting stent to a 99% mid LAD, 40-50% narrowing in the distal LAD with mild to moderate disease in the circ and RCA   Dyslipidemia    ED (erectile dysfunction)    Hyperplastic colon polyp    Hypoglycemia    Past Surgical History:  Procedure Laterality Date   CARDIOVERSION N/A 04/23/2016   Procedure: CARDIOVERSION;  Surgeon: [name], MD;  Location: [place];  Service: Cardiovascular;  Laterality: N/A;   COLONOSCOPY     CORONARY ANGIOPLASTY WITH STENT PLACEMENT     POLYPECTOMY     TEE WITHOUT CARDIOVERSION N/A 04/23/2016   Procedure: TRANSESOPHAGEAL ECHOCARDIOGRAM (TEE);  Surgeon: [name], MD;  Location: [facility] ENDOSCOPY;  Service: Cardiovascular;  Laterality: N/A;   UPPER GASTROINTESTINAL ENDOSCOPY       Current Meds  Medication Sig   acetaminophen (TYLENOL) 500 MG tablet Take 500 mg by mouth every 6 (six) hours as needed for headache (pain).   amiodarone (PACERONE) 200 MG tablet Take 1 tablet twice a day for 1 month then reduce to once a day. (Patient taking differently: 200 mg daily. Take 1 tablet by mouth daily.)   atorvastatin (LIPITOR) 20 MG tablet Take 1 tablet (20 mg total) by mouth daily.   Cholecalciferol (VITAMIN D-3) 1000 UNITS CAPS Take 1,000 Units by mouth daily.   fluticasone (FLONASE) 50 MCG/ACT nasal spray Place 1 spray into both nostrils daily as needed (seasonal allergies).    loratadine (CLARITIN) 10 MG tablet Take 10 mg by mouth as needed for allergies. Take 1 tablet daily as needed for seasonal allergies   Melatonin 3 MG TABS Take 6 mg by mouth every evening.   midodrine (PROAMATINE) 5 MG tablet Take 5 mg by mouth. Take 1 tablet in the am.   Wheat Dextrin (BENEFIBER PO) Take 30 mLs by mouth daily.   XARELTO 20 MG TABS tablet TAKE 1 TABLET (20 MG TOTAL) BY MOUTH DAILY WITH SUPPER.  (Patient taking differently: 20 mg daily with breakfast. )   Current Facility-Administered Medications for the 11/13/18 encounter (Telemedicine) with Lendon Colonel, NP  Medication   0.9 %  sodium chloride infusion     Allergies:   Penicillins   Social History   Tobacco Use   Smoking status: Never Smoker   Smokeless tobacco: Never Used  Substance Use Topics   Alcohol use: Yes    Alcohol/week: 5.0 standard drinks    Types: 5 Glasses of wine per week    Comment: red wine   Drug use: No     Family Hx: The patient's family history includes Bone cancer in his paternal grandfather; Breast cancer in his mother; CAD in his father; Cancer in his brother; Colon cancer in his brother and mother; Esophageal cancer in his brother; Heart attack in his paternal grandmother; Leukemia in his paternal uncle; Liver cancer in his brother. There is no history of Pancreatic cancer, Prostate cancer,  Rectal cancer, or Stomach cancer.  ROS:   Please see the history of present illness.    All other systems reviewed and are negative.   Prior CV studies:   The following studies were reviewed today: Cath 06/08/2010    Echocardiogram 06/13/2017 Study Conclusions  - Left ventricle: The cavity size was normal. Systolic function was   mildly reduced. The estimated ejection fraction was in the range   of 45% to 50%. Mild diffuse hypokinesis. Left ventricular   diastolic function parameters were normal. - Aortic valve: Transvalvular velocity was within the normal range.   There was no stenosis. There was trivial regurgitation. - Mitral valve: There was trivial regurgitation. - Left atrium: The atrium was moderately dilated. - Right ventricle: The cavity size was normal. Wall thickness was   normal. Systolic function was normal. - Right atrium: The atrium was severely dilated. - Atrial septum: No defect or patent foramen ovale was identified   by color flow Doppler. - Tricuspid valve: There  was trivial regurgitation. - Pulmonary arteries: Systolic pressure was within the normal   range. PA peak pressure: 24 mm Hg (S).  Labs/Other Tests and Data Reviewed:    EKG:  No ECG reviewed.  Recent Labs: 10/15/2018: ALT 38; BUN 22; Creatinine, Ser 0.91; Hemoglobin 14.2; Magnesium 2.2; Platelets 221; Potassium 4.5; Sodium 139   Recent Lipid Panel Lab Results  Component Value Date/Time   CHOL 143 04/19/2018 08:07 AM   TRIG 40 04/19/2018 08:07 AM   HDL 55 04/19/2018 08:07 AM   CHOLHDL 2.6 04/19/2018 08:07 AM   CHOLHDL 3.0 06/29/2016 10:11 AM   LDLCALC 80 04/19/2018 08:07 AM    Wt Readings from Last 3 Encounters:  11/13/18 143 lb (64.9 kg)  10/30/18 145 lb (65.8 kg)  07/26/18 155 lb 12.8 oz (70.7 kg)     Objective:    Vital Signs:  BP 119/70 (BP Location: Left Arm)    Pulse 63    Ht 6' (1.829 m)    Wt 143 lb (64.9 kg)    BMI 19.39 kg/m    VITAL SIGNS:  reviewed GEN:  no acute distress RESPIRATORY:  normal respiratory effort, symmetric expansion PSYCH:  normal affect  ASSESSMENT & PLAN:    1.Stable Angina: Continues to have shoulder and neck pain with mild dyspnea during minimal activities such as cooking or low level walking. He has not been out walking since he began to have more symptoms, which are reminscent of his angina pain prior to stent to the LAD.              Per Dr. Martinique, he will be scheduled for cardiac cath for definitive evaluation of       coronary anatomy and progression of CAD. This is scheduled for May 6, at 11:30 am. He will hold Xarelto for 24 hours prior to the procedure. Labs will be drawn this week prior to the cath.   2. CAD: Hx of stent to the LAD as above in [place] in 2011, due to high grade stenosis. He had moderate 50%-70% stenosis to the left circumflex just after the first marginal and another 50% narrowing at the origin of the PDA. He remains on atorvastatin, 20 mg daily, he is not on ASA due to DOAC.   3. Atrial fib: Heart rate is  controlled per home BP cuff on amiodarone 200 mg daily. Nausea is resolved with decreased dose from 200 mg BID to 200 mg daily. He  Continues on  Xarelto 20 mg daily without bleeding or bruising.   4. Hypercholesterolemia: Continue on Lipitor 20 mg daily. Will likely need higher dose if stent is placed. Will need to check LFT as he is on amiodarone if statin dose is increased.   5. Orthostatic Hypotension: On midodrine 5 mg in the am.   The patient understands that risks include but are not limited to stroke (1 in 1000), death (1 in 1000), kidney failure [usually temporary] (1 in 500), bleeding (1 in 200), allergic reaction [possibly serious] (1 in 200), and agrees to proceed.     COVID-19 Education: The signs and symptoms of COVID-19 were discussed with the patient and how to seek care for testing (follow up with PCP or arrange E-visit).  The importance of social distancing was discussed today.  Time:   Today, I have spent 25 minutes with the patient with telehealth technology discussing the above problems. Answered multiple questions and discussed cardiac cath procedure and recovery.    Medication Adjustments/Labs and Tests Ordered: Current medicines are reviewed at length with the patient today.  Concerns regarding medicines are outlined above.   Tests Ordered: No orders of the defined types were placed in this encounter.   Medication Changes: No orders of the defined types were placed in this encounter.   Disposition:  Follow up one month after cardiac cath   SignedPhill Myron. West Pugh, ANP, AACC  11/13/2018 2:09 PM      [facility]

## 2018-11-13 ENCOUNTER — Other Ambulatory Visit: Payer: Self-pay | Admitting: Adult Health

## 2018-11-13 ENCOUNTER — Telehealth (INDEPENDENT_AMBULATORY_CARE_PROVIDER_SITE_OTHER): Payer: Medicare Other | Admitting: Adult Health

## 2018-11-13 VITALS — BP 119/70 | HR 63 | Ht 72.0 in | Wt 143.0 lb

## 2018-11-13 DIAGNOSIS — Z79899 Other long term (current) drug therapy: Secondary | ICD-10-CM

## 2018-11-13 DIAGNOSIS — I480 Paroxysmal atrial fibrillation: Secondary | ICD-10-CM | POA: Diagnosis not present

## 2018-11-13 DIAGNOSIS — I951 Orthostatic hypotension: Secondary | ICD-10-CM

## 2018-11-13 DIAGNOSIS — I2510 Atherosclerotic heart disease of native coronary artery without angina pectoris: Secondary | ICD-10-CM

## 2018-11-13 DIAGNOSIS — E7800 Pure hypercholesterolemia, unspecified: Secondary | ICD-10-CM

## 2018-11-13 MED ORDER — ATORVASTATIN CALCIUM 20 MG PO TABS: 20.0000 mg | ORAL_TABLET | Freq: Every day | ORAL | 1 refills | Status: DC

## 2018-11-13 MED ORDER — AMIODARONE HCL 200 MG PO TABS: 200.0000 mg | ORAL_TABLET | Freq: Every day | ORAL | 1 refills | Status: DC

## 2018-11-13 MED ORDER — RIVAROXABAN 20 MG PO TABS: ORAL_TABLET | ORAL | 1 refills | Status: DC

## 2018-11-13 NOTE — Patient Instructions (Signed)
    [place] [address] Dept: [phone] Loc: [place]  11/13/2018  You are scheduled for a Cardiac Catheterization on Wednesday, May 6 with Dr. Sherren Mocha.  1. Please arrive at the [facility] (Main Entrance A) at [facility]: [address] [place], [place] [address] at 9:00 AM (This time is two hours before your procedure to ensure your preparation). Free valet parking service is available.   Special note: Every effort is made to have your procedure done on time. Please understand that emergencies sometimes delay scheduled procedures.  2. Diet: Do not eat solid foods after midnight.  The patient may have clear liquids until 5am upon the day of the procedure.  3. Labs: You will need to have blood drawn on Tuesday, May 5 at [facility]  Open: 8am - 5pm (Lunch 12:30 - 1:30)   Phone: [phone]. You do not need to be fasting.  4. Medication instructions in preparation for your procedure:   Contrast Allergy: No     Current Outpatient Medications (Cardiovascular):  .  amiodarone (PACERONE) 200 MG tablet, Take 1 tablet twice a day for 1 month then reduce to once a day. (Patient taking differently: 200 mg daily. Take 1 tablet by mouth daily.) .  atorvastatin (LIPITOR) 20 MG tablet, Take 1 tablet (20 mg total) by mouth daily. .  midodrine (PROAMATINE) 5 MG tablet, Take 5 mg by mouth. Take 1 tablet in the am.   Current Outpatient Medications (Respiratory):  .  fluticasone (FLONASE) 50 MCG/ACT nasal spray, Place 1 spray into both nostrils daily as needed (seasonal allergies).  .  loratadine (CLARITIN) 10 MG tablet, Take 10 mg by mouth as needed for allergies. Take 1 tablet daily as needed for seasonal allergies   Current Outpatient Medications (Analgesics):  .  acetaminophen (TYLENOL) 500 MG tablet,  Take 500 mg by mouth every 6 (six) hours as needed for headache (pain).   Current Outpatient Medications (Hematological):  Marland Kitchen  XARELTO 20 MG TABS tablet, TAKE 1 TABLET (20 MG TOTAL) BY MOUTH DAILY WITH SUPPER. (Patient taking differently: 20 mg daily with breakfast. )   Current Outpatient Medications (Other):  Marland Kitchen  Cholecalciferol (VITAMIN D-3) 1000 UNITS CAPS, Take 1,000 Units by mouth daily. .  Melatonin 3 MG TABS, Take 6 mg by mouth every evening. .  Wheat Dextrin (BENEFIBER PO), Take 30 mLs by mouth daily.  Current Facility-Administered Medications (Other):  .  0.9 %  sodium chloride infusion *For reference purposes while preparing patient instructions.   Delete this med list prior to printing instructions for patient.*  Stop taking Xarelto (Rivaroxaban) on Tuesday, May 5.  On the morning of your procedure, take your Aspirin and any morning medicines NOT listed above.  You may use sips of water.  5. Plan for one night stay--bring personal belongings. 6. Bring a current list of your medications and current insurance cards. 7. You MUST have a responsible person to drive you home. 8. Someone MUST be with you the first 24 hours after you arrive home or your discharge will be delayed. 9. Please wear clothes that are easy to get on and off and wear slip-on shoes.  Thank you for allowing [place] to care for you!   -- [facility]

## 2018-11-14 ENCOUNTER — Telehealth: Payer: Self-pay | Admitting: *Deleted

## 2018-11-14 DIAGNOSIS — Z79899 Other long term (current) drug therapy: Secondary | ICD-10-CM | POA: Diagnosis not present

## 2018-11-14 DIAGNOSIS — I480 Paroxysmal atrial fibrillation: Secondary | ICD-10-CM | POA: Diagnosis not present

## 2018-11-14 LAB — CBC
Hematocrit: 42.5 % (ref 37.5–51.0)
Hemoglobin: 14.1 g/dL (ref 13.0–17.7)
MCH: 29.7 pg (ref 26.6–33.0)
MCHC: 33.2 g/dL (ref 31.5–35.7)
MCV: 90 fL (ref 79–97)
Platelets: 213 10*3/uL (ref 150–450)
RBC: 4.74 x10E6/uL (ref 4.14–5.80)
RDW: 13.8 % (ref 11.6–15.4)
WBC: 5.8 10*3/uL (ref 3.4–10.8)

## 2018-11-14 LAB — BASIC METABOLIC PANEL
BUN/Creatinine Ratio: 22 (ref 10–24)
BUN: 23 mg/dL (ref 8–27)
CO2: 29 mmol/L (ref 20–29)
Calcium: 9.3 mg/dL (ref 8.6–10.2)
Chloride: 105 mmol/L (ref 96–106)
Creatinine, Ser: 1.04 mg/dL (ref 0.76–1.27)
GFR calc Af Amer: 80 mL/min/{1.73_m2} (ref >59)
GFR calc non Af Amer: 69 mL/min/{1.73_m2} (ref >59)
Glucose: 91 mg/dL (ref 65–99)
Potassium: 5.1 mmol/L (ref 3.5–5.2)
Sodium: 141 mmol/L (ref 134–144)

## 2018-11-14 NOTE — Telephone Encounter (Addendum)
Pt contacted pre-catheterization scheduled at [facility] for: Wednesday Nov 15, 2018 11:30 AM Verified arrival time and place: [place] Entrance A at: 9:30 AM  No solid food after midnight prior to cath, clear liquids until 5 AM day of procedure. Contrast allergy:  no  Hold: Xarelto-May 5,2020 until post procedure per K Lawrence,NP  Except hold medications AM meds can be  taken pre-cath with sip of water including: ASA 81 mg  Confirmed patient has responsible person to drive home post procedure and observe 24 hours after arriving home.   Pt advised due to Covid-19 pandemic, [facility] is restricting visitors and only patients should present for check-in prior to their procedure. People will not be allowed to enter [facility] with the patient. At this time [facility] is not allowing visitors to all [facility] campuses.      ____________   [id number] Pre-Screening Questions:  . Do you currently have a fever? . Have you recently travelled on a cruise, internationally, or to [place], [place], [place], [place], [place], or [place], [place] [employer]) ?  Marland Kitchen Have you been in contact with someone that is currently pending confirmation of Covid19 testing or has been confirmed to have the [place] virus? Marland Kitchen Are you currently experiencing fatigue or cough? . Are you currently experiencing new or worsening shortness of breath at rest or with minimal activity? . Have you been in contact with someone that was recently sick with fever/cough/fatigue?      LMTCB for pt to review procedure instructions, visitor restrictions, Covid-19 screening questions.  Mailbox full, unable to leave a message.  LMTCB for pt to review procedure instructions, visitor restrictions, Covid-19 screening questions.

## 2018-11-15 ENCOUNTER — Ambulatory Visit (HOSPITAL_COMMUNITY)
Admission: RE | Admit: 2018-11-15 | Discharge: 2018-11-15 | Disposition: A | Discharge status: Home / Self Care | Payer: Medicare Other | Attending: Cardiovascular Disease | Admitting: Cardiovascular Disease

## 2018-11-15 ENCOUNTER — Other Ambulatory Visit: Payer: Self-pay

## 2018-11-15 ENCOUNTER — Encounter (HOSPITAL_COMMUNITY)
Admission: RE | Disposition: A | Discharge status: Home / Self Care | Payer: Self-pay | Source: Home / Self Care | Attending: Cardiovascular Disease

## 2018-11-15 ENCOUNTER — Encounter (HOSPITAL_COMMUNITY): Payer: Self-pay | Admitting: Cardiology

## 2018-11-15 DIAGNOSIS — Z955 Presence of coronary angioplasty implant and graft: Secondary | ICD-10-CM

## 2018-11-15 DIAGNOSIS — I5022 Chronic systolic (congestive) heart failure: Secondary | ICD-10-CM | POA: Diagnosis not present

## 2018-11-15 DIAGNOSIS — Z7901 Long term (current) use of anticoagulants: Secondary | ICD-10-CM | POA: Insufficient documentation

## 2018-11-15 DIAGNOSIS — I480 Paroxysmal atrial fibrillation: Secondary | ICD-10-CM | POA: Insufficient documentation

## 2018-11-15 DIAGNOSIS — I200 Unstable angina: Secondary | ICD-10-CM | POA: Diagnosis present

## 2018-11-15 DIAGNOSIS — Z79899 Other long term (current) drug therapy: Secondary | ICD-10-CM | POA: Diagnosis not present

## 2018-11-15 DIAGNOSIS — N400 Enlarged prostate without lower urinary tract symptoms: Secondary | ICD-10-CM | POA: Insufficient documentation

## 2018-11-15 DIAGNOSIS — E7800 Pure hypercholesterolemia, unspecified: Secondary | ICD-10-CM | POA: Insufficient documentation

## 2018-11-15 DIAGNOSIS — Z8249 Family history of ischemic heart disease and other diseases of the circulatory system: Secondary | ICD-10-CM | POA: Diagnosis not present

## 2018-11-15 DIAGNOSIS — N529 Male erectile dysfunction, unspecified: Secondary | ICD-10-CM | POA: Insufficient documentation

## 2018-11-15 DIAGNOSIS — I951 Orthostatic hypotension: Secondary | ICD-10-CM | POA: Diagnosis not present

## 2018-11-15 DIAGNOSIS — M542 Cervicalgia: Secondary | ICD-10-CM | POA: Insufficient documentation

## 2018-11-15 DIAGNOSIS — Z880 Allergy status to penicillin: Secondary | ICD-10-CM | POA: Diagnosis not present

## 2018-11-15 DIAGNOSIS — I25110 Atherosclerotic heart disease of native coronary artery with unstable angina pectoris: Secondary | ICD-10-CM

## 2018-11-15 HISTORY — PX: CORONARY STENT INTERVENTION: CATH118234

## 2018-11-15 HISTORY — PX: LEFT HEART CATH AND CORONARY ANGIOGRAPHY: CATH118249

## 2018-11-15 LAB — POCT ACTIVATED CLOTTING TIME: Activated Clotting Time: 263 seconds

## 2018-11-15 SURGERY — LEFT HEART CATH AND CORONARY ANGIOGRAPHY
Anesthesia: LOCAL

## 2018-11-15 MED ORDER — RIVAROXABAN 20 MG PO TABS: 20.0000 mg | ORAL_TABLET | Freq: Every day | ORAL | Status: DC

## 2018-11-15 MED ORDER — SODIUM CHLORIDE 0.9 % WEIGHT BASED INFUSION: 1.0000 mL/kg/h | INTRAVENOUS | Status: DC

## 2018-11-15 MED ORDER — AMIODARONE HCL 200 MG PO TABS: 200.0000 mg | ORAL_TABLET | Freq: Every day | ORAL | Status: DC

## 2018-11-15 MED ORDER — SODIUM CHLORIDE 0.9% FLUSH: 3.0000 mL | INTRAVENOUS | Status: DC | PRN

## 2018-11-15 MED ORDER — HEPARIN SODIUM (PORCINE) 1000 UNIT/ML IJ SOLN
INTRAMUSCULAR | Status: AC
  Filled 2018-11-15: qty 1

## 2018-11-15 MED ORDER — ONDANSETRON HCL 4 MG/2ML IJ SOLN: 4.0000 mg | Freq: Four times a day (QID) | INTRAMUSCULAR | Status: DC | PRN

## 2018-11-15 MED ORDER — SODIUM CHLORIDE 0.9 % IV SOLN: INTRAVENOUS | Status: AC

## 2018-11-15 MED ORDER — ACETAMINOPHEN 325 MG PO TABS: 650.0000 mg | ORAL_TABLET | ORAL | Status: DC | PRN

## 2018-11-15 MED ORDER — FENTANYL CITRATE (PF) 100 MCG/2ML IJ SOLN
INTRAMUSCULAR | Status: AC
  Filled 2018-11-15: qty 2

## 2018-11-15 MED ORDER — ASPIRIN 81 MG PO CHEW: 81.0000 mg | CHEWABLE_TABLET | Freq: Every day | ORAL | Status: DC

## 2018-11-15 MED ORDER — ASPIRIN 81 MG PO CHEW
81.0000 mg | CHEWABLE_TABLET | ORAL | Status: AC
  Administered 2018-11-15: 10:00:00 81 mg via ORAL
  Filled 2018-11-15: qty 1

## 2018-11-15 MED ORDER — HYDRALAZINE HCL 20 MG/ML IJ SOLN: 10.0000 mg | INTRAMUSCULAR | Status: DC | PRN

## 2018-11-15 MED ORDER — SODIUM CHLORIDE 0.9 % IV SOLN: 250.0000 mL | INTRAVENOUS | Status: DC | PRN

## 2018-11-15 MED ORDER — HEPARIN (PORCINE) IN NACL 1000-0.9 UT/500ML-% IV SOLN
INTRAVENOUS | Status: AC
  Filled 2018-11-15: qty 1000

## 2018-11-15 MED ORDER — VERAPAMIL HCL 2.5 MG/ML IV SOLN
INTRAVENOUS | Status: AC
  Filled 2018-11-15: qty 2

## 2018-11-15 MED ORDER — MIDAZOLAM HCL 2 MG/2ML IJ SOLN
INTRAMUSCULAR | Status: AC
  Filled 2018-11-15: qty 2

## 2018-11-15 MED ORDER — CLOPIDOGREL BISULFATE 75 MG PO TABS: 75.0000 mg | ORAL_TABLET | Freq: Every day | ORAL | 2 refills | Status: DC

## 2018-11-15 MED ORDER — MIDODRINE HCL 5 MG PO TABS: 5.0000 mg | ORAL_TABLET | Freq: Every day | ORAL | Status: DC

## 2018-11-15 MED ORDER — VERAPAMIL HCL 2.5 MG/ML IV SOLN
INTRAVENOUS | Status: DC | PRN
  Administered 2018-11-15: 10 mL via INTRA_ARTERIAL

## 2018-11-15 MED ORDER — ATORVASTATIN CALCIUM 20 MG PO TABS: 20.0000 mg | ORAL_TABLET | Freq: Every day | ORAL | Status: DC

## 2018-11-15 MED ORDER — HEPARIN SODIUM (PORCINE) 1000 UNIT/ML IJ SOLN
INTRAMUSCULAR | Status: DC | PRN
  Administered 2018-11-15: 4000 [IU] via INTRAVENOUS
  Administered 2018-11-15: 6000 [IU] via INTRAVENOUS

## 2018-11-15 MED ORDER — LIDOCAINE HCL (PF) 1 % IJ SOLN
INTRAMUSCULAR | Status: DC | PRN
  Administered 2018-11-15: 2 mL

## 2018-11-15 MED ORDER — CLOPIDOGREL BISULFATE 300 MG PO TABS
ORAL_TABLET | ORAL | Status: DC | PRN
  Administered 2018-11-15: 600 mg via ORAL

## 2018-11-15 MED ORDER — SODIUM CHLORIDE 0.9% FLUSH: 3.0000 mL | Freq: Two times a day (BID) | INTRAVENOUS | Status: DC

## 2018-11-15 MED ORDER — IOHEXOL 350 MG/ML SOLN
INTRAVENOUS | Status: DC | PRN
  Administered 2018-11-15: 120 mL via INTRACARDIAC

## 2018-11-15 MED ORDER — CLOPIDOGREL BISULFATE 300 MG PO TABS
ORAL_TABLET | ORAL | Status: AC
  Filled 2018-11-15: qty 2

## 2018-11-15 MED ORDER — LIDOCAINE HCL (PF) 1 % IJ SOLN
INTRAMUSCULAR | Status: AC
  Filled 2018-11-15: qty 30

## 2018-11-15 MED ORDER — FENTANYL CITRATE (PF) 100 MCG/2ML IJ SOLN
INTRAMUSCULAR | Status: DC | PRN
  Administered 2018-11-15: 25 ug via INTRAVENOUS

## 2018-11-15 MED ORDER — LABETALOL HCL 5 MG/ML IV SOLN: 10.0000 mg | INTRAVENOUS | Status: DC | PRN

## 2018-11-15 MED ORDER — FAMOTIDINE IN NACL 20-0.9 MG/50ML-% IV SOLN
INTRAVENOUS | Status: AC
  Filled 2018-11-15: qty 50

## 2018-11-15 MED ORDER — VITAMIN D-3 25 MCG (1000 UT) PO CAPS: 1000.0000 [IU] | ORAL_CAPSULE | Freq: Every day | ORAL | Status: DC

## 2018-11-15 MED ORDER — MIDAZOLAM HCL 2 MG/2ML IJ SOLN
INTRAMUSCULAR | Status: DC | PRN
  Administered 2018-11-15: 1 mg via INTRAVENOUS

## 2018-11-15 MED ORDER — CLOPIDOGREL BISULFATE 75 MG PO TABS: 75.0000 mg | ORAL_TABLET | Freq: Every day | ORAL | Status: DC

## 2018-11-15 MED ORDER — HEPARIN (PORCINE) IN NACL 1000-0.9 UT/500ML-% IV SOLN
INTRAVENOUS | Status: DC | PRN
  Administered 2018-11-15 (×2): 500 mL

## 2018-11-15 MED ORDER — FLUTICASONE PROPIONATE 50 MCG/ACT NA SUSP: 1.0000 | Freq: Every day | NASAL | Status: DC | PRN

## 2018-11-15 MED ORDER — FAMOTIDINE IN NACL 20-0.9 MG/50ML-% IV SOLN
INTRAVENOUS | Status: AC | PRN
  Administered 2018-11-15: 20 mg via INTRAVENOUS

## 2018-11-15 MED ORDER — ACETAMINOPHEN 500 MG PO TABS: 500.0000 mg | ORAL_TABLET | Freq: Four times a day (QID) | ORAL | Status: DC | PRN

## 2018-11-15 MED ORDER — SODIUM CHLORIDE 0.9 % WEIGHT BASED INFUSION
3.0000 mL/kg/h | INTRAVENOUS | Status: AC
  Administered 2018-11-15: 3 mL/kg/h via INTRAVENOUS

## 2018-11-15 MED FILL — CLOPIDOGREL 75 MG TABLET: 75 | 30 days supply | Qty: 30 | Fill #0 | Status: TO

## 2018-11-15 SURGICAL SUPPLY — 17 items
BALLN EMERGE MR 2.0X12 (BALLOONS) ×2
BALLN SAPPHIRE ~~LOC~~ 2.25X12 (BALLOONS) ×1 IMPLANT
BALLOON EMERGE MR 2.0X12 (BALLOONS) IMPLANT
CATH 5FR JL3.5 JR4 ANG PIG MP (CATHETERS) ×1 IMPLANT
CATH VISTA GUIDE 6FR XBLAD3.5 (CATHETERS) ×1 IMPLANT
DEVICE RAD COMP TR BAND LRG (VASCULAR PRODUCTS) ×1 IMPLANT
GLIDESHEATH SLEND SS 6F .021 (SHEATH) ×1 IMPLANT
GUIDEWIRE INQWIRE 1.5J.035X260 (WIRE) IMPLANT
INQWIRE 1.5J .035X260CM (WIRE) ×2
KIT ENCORE 26 ADVANTAGE (KITS) ×1 IMPLANT
KIT HEART LEFT (KITS) ×2 IMPLANT
PACK CARDIAC CATHETERIZATION (CUSTOM PROCEDURE TRAY) ×2 IMPLANT
STENT SYNERGY DES 2.25X16 (Permanent Stent) ×1 IMPLANT
SYR MEDRAD MARK 7 150ML (SYRINGE) ×2 IMPLANT
TRANSDUCER W/STOPCOCK (MISCELLANEOUS) ×2 IMPLANT
TUBING CIL FLEX 10 FLL-RA (TUBING) ×2 IMPLANT
WIRE COUGAR XT STRL 190CM (WIRE) ×1 IMPLANT

## 2018-11-15 NOTE — Discharge Instructions (Signed)
Radial Site Care Refer to this sheet in the next few weeks. These instructions provide you with information on caring for yourself after your procedure. Your caregiver may also give you more specific instructions. Your treatment has been planned according to current medical practices, but problems sometimes occur. Call your caregiver if you have any problems or questions after your procedure. HOME CARE INSTRUCTIONS You may shower the day after the procedure.Remove the bandage (dressing) and gently wash the site with plain soap and water.Gently pat the site dry.  Do not apply powder or lotion to the site.  Do not submerge the affected site in water for 3 to 5 days.  Inspect the site at least twice daily.  Do not flex or bend the affected arm for 24 hours.  No lifting over 5 pounds (2.3 kg) for 5 days after your procedure.  Do not drive home if you are discharged the same day of the procedure. Have someone else drive you.  You may drive 24 hours after the procedure unless otherwise instructed by your caregiver.  What to expect: Any bruising will usually fade within 1 to 2 weeks.  Blood that collects in the tissue (hematoma) may be painful to the touch. It should usually decrease in size and tenderness within 1 to 2 weeks.  SEEK IMMEDIATE MEDICAL CARE IF: You have unusual pain at the radial site.  You have redness, warmth, swelling, or pain at the radial site.  You have drainage (other than a small amount of blood on the dressing).  You have chills.  You have a fever or persistent symptoms for more than 72 hours.  You have a fever and your symptoms suddenly get worse.  Your arm becomes pale, cool, tingly, or numb.  You have heavy bleeding from the site. Hold pressure on the site.   PLEASE DO NOT MISS ANY DOSES OF YOUR PLAVIX!!!!! Also keep a log of you blood pressures and bring back to your follow up appt. Please call the office with any questions.   Patients taking blood thinners  should generally stay away from medicines like ibuprofen, Advil, Motrin, naproxen, and Aleve due to risk of stomach bleeding. You may take Tylenol as directed or talk to your primary doctor about alternatives.  You will be on aspirin, plavix and xarelto for one month, then stop aspirin after a month and continue plavix/xarelto.    Radial Site Care  This sheet gives you information about how to care for yourself after your procedure. Your health care provider may also give you more specific instructions. If you have problems or questions, contact your health care provider. What can I expect after the procedure? After the procedure, it is common to have:  Bruising and tenderness at the catheter insertion area. Follow these instructions at home: Medicines  Take over-the-counter and prescription medicines only as told by your health care provider. Insertion site care  Follow instructions from your health care provider about how to take care of your insertion site. Make sure you: ? Wash your hands with soap and water before you change your bandage (dressing). If soap and water are not available, use hand sanitizer. ? Change your dressing as told by your health care provider. ? Leave stitches (sutures), skin glue, or adhesive strips in place. These skin closures may need to stay in place for 2 weeks or longer. If adhesive strip edges start to loosen and curl up, you may trim the loose edges. Do not  remove adhesive strips completely unless your health care provider tells you to do that.  Check your insertion site every day for signs of infection. Check for: ? Redness, swelling, or pain. ? Fluid or blood. ? Pus or a bad smell. ? Warmth.  Do not take baths, swim, or use a hot tub until your health care provider approves.  You may shower 24-48 hours after the procedure, or as directed by your health care provider. ? Remove the dressing and gently wash the site with plain soap and  water. ? Pat the area dry with a clean towel. ? Do not rub the site. That could cause bleeding.  Do not apply powder or lotion to the site. Activity   For 24 hours after the procedure, or as directed by your health care provider: ? Do not flex or bend the affected arm. ? Do not push or pull heavy objects with the affected arm. ? Do not drive yourself home from the hospital or clinic. You may drive 24 hours after the procedure unless your health care provider tells you not to. ? Do not operate machinery or power tools.  Do not lift anything that is heavier than 10 lb (4.5 kg), or the limit that you are told, until your health care provider says that it is safe.  Ask your health care provider when it is okay to: ? Return to work or school. ? Resume usual physical activities or sports. ? Resume sexual activity. General instructions  If the catheter site starts to bleed, raise your arm and put firm pressure on the site. If the bleeding does not stop, get help right away. This is a medical emergency.  If you went home on the same day as your procedure, a responsible adult should be with you for the first 24 hours after you arrive home.  Keep all follow-up visits as told by your health care provider. This is important. Contact a health care provider if:  You have a fever.  You have redness, swelling, or yellow drainage around your insertion site. Get help right away if:  You have unusual pain at the radial site.  The catheter insertion area swells very fast.  The insertion area is bleeding, and the bleeding does not stop when you hold steady pressure on the area.  Your arm or hand becomes pale, cool, tingly, or numb. These symptoms may represent a serious problem that is an emergency. Do not wait to see if the symptoms will go away. Get medical help right away. Call your local emergency services (911 in the [place] Do not drive yourself to the hospital. Summary  After the  procedure, it is common to have bruising and tenderness at the site.  Follow instructions from your health care provider about how to take care of your radial site wound. Check the wound every day for signs of infection.  Do not lift anything that is heavier than 10 lb (4.5 kg), or the limit that you are told, until your health care provider says that it is safe. This information is not intended to replace advice given to you by your health care provider. Make sure you discuss any questions you have with your health care provider. Document Released: 07/31/2010 Document Revised: 08/03/2017 Document Reviewed: 08/03/2017 Elsevier Interactive Patient Education  2019 [employer].

## 2018-11-15 NOTE — Progress Notes (Signed)
Cardiac Rehab Advisory Cardiac Rehab Phase I is not seeing pts face to face at this time due to Covid 19 restrictions. Ambulation is occurring through nursing, PT, and mobility teams. We will help facilitate that process as needed. We are calling pts in their rooms and discussing education. We will then deliver education materials to pts RN for delivery to pt.   4237-0230 PCI/stent education completed with patient including restrictions, risk factor modification and activity progression. Pt very knowledgeable about nutrition and is following a heart healthy diet. Pt is more concerned about keeping weight up. Pt is very active and was previously walking 180-200 minutes/week. Patient previously participated in phase 2 cardiac rehab in [facility] after prior stent in 2011 and would like to participate in cardiac rehab again. Pt is interested in the program at [facility] when it reopens, referral sent.  Sol Passer, MS, ACSM CEP

## 2018-11-15 NOTE — Interval H&P Note (Signed)
History and Physical Interval Note:  11/15/2018 11:41 AM  Jerry Barr  has presented today for cardiac cath with the diagnosis of unstable angina.  The various methods of treatment have been discussed with the patient and family. After consideration of risks, benefits and other options for treatment, the patient has consented to  Procedure(s): LEFT HEART CATH AND CORONARY ANGIOGRAPHY (N/A) as a surgical intervention.  The patient's history has been reviewed, patient examined, no change in status, stable for surgery.  I have reviewed the patient's chart and labs.  Questions were answered to the patient's satisfaction.    Cath Lab Visit (complete for each Cath Lab visit)  Clinical Evaluation Leading to the Procedure:   ACS: No.  Non-ACS:    Anginal Classification: CCS III  Anti-ischemic medical therapy: No Therapy  Non-Invasive Test Results: No non-invasive testing performed  Prior CABG: No previous CABG         [name]

## 2018-11-15 NOTE — Discharge Summary (Signed)
Discharge Summary/SAME DAY PCI    Patient ID: Jerry Barr,  MRN: [id number], DOB/AGE: [birth year] 78 y.o.  Admit date: 11/15/2018 Discharge date: 11/15/2018  Primary Care Provider: Antony Contras Primary Cardiologist: Dr. Martinique  Discharge Diagnoses    Active Problems:   Unstable angina ([place])   Allergies Allergies  Allergen Reactions  . Penicillins Rash    Did it involve swelling of the face/tongue/throat, SOB, or low BP? No Did it involve sudden or severe rash/hives, skin peeling, or any reaction on the inside of your mouth or nose? No Did you need to seek medical attention at a hospital or doctor's office? No When did it last happen?50+ years  If all above answers are "NO", may proceed with cephalosporin use.     Diagnostic Studies/Procedures    Cath: 11/15/18   Prox RCA lesion is 20% stenosed.  Mid Cx lesion is 90% stenosed.  Previously placed Prox LAD to Mid LAD stent (unknown type) is widely patent.  A drug-eluting stent was successfully placed using a STENT SYNERGY DES [id number].  Post intervention, there is a 0% residual stenosis.  The left ventricular systolic function is normal.  LV end diastolic pressure is normal.  The left ventricular ejection fraction is 55-65% by visual estimate.  There is no mitral valve regurgitation.   1. Patent mid LAD stent without restenosis 2. Severe stenosis mid Circumflex artery.  3. Successful PTCA/DES x 1 mid Circumflex 4. Mild non-obstructive disease in the RCA 5. Normal LV systolic function  Recommendations: ASA and Plavix for one month then stop ASA. Will restart Xarelto tomorrow. Would recommend at least six month of Plavix and Xarelto but could consider stopping Plavix at 6 months if necessary. Likely same day PCI discharge.   _____________   History of Present Illness     78 y.o. male with complicated medical history, to include, chronic systolic heart failure chronic hypotension, PAF on Xarelto,  coronary artery disease status post drug-eluting stent to the LAD in 2011 while living in [place], hyperlipidemia.   The patient had a successful TEE cardioversion on 04/23/2016.  Due to hypotension he was placed on midodrine for orthostatic hypotension.  He was not placed on rate control therapy as this could potentially cause worsening hypotension.  Repeat echocardiogram in December 2018 revealed an EF of 45% to 50% with mild diffuse hypokinesis, PA pressure 24 mmHg.  Unfortunately, the patient was seen in the emergency room with recurrent atrial fib and fatigue.  Given his history of orthostatic hypotension the patient had cardioversion in the ED performed by Dr. Haroldine Laws.  Post cardioversion amiodarone was increased to 200 mg twice daily.    He has been planned to follow in the atrial fib clinic.  Unfortunately, he called our office on 10/30/2018 because he was having nausea weight loss, and amiodarone was decreased back to 200 mg daily.  He also complained of chest pain between his shoulder blades every time he does a brisk walk, it is not exacerbated by palpation rotation neck rotation or deep inspiration.  Only occurring with exertion.  He says they are similar to those symptoms which occurred prior to his stent placement in 2011. The patient was seen by Almyra Deforest., PA who discussed this with his primary cardiologist Dr. Martinique.  He was not placed on any nitrates to avoid worsening hypotension.  Dr. Martinique recommended follow-up to discuss his symptoms today, if they were continuing he was to be scheduled for a cardiac catheterization.  He was to continue Xarelto for 4 weeks before planning cardiac catheterization.  He is feeling some better after decreased dose of amiodarone to 200 mg daily, with nausea completely resolved. He continues to have neck pain and shoulder blade pain with minimal exertion. Mild dyspnea, with HR elevation. HR and symptoms resolve with rest. He used  to take walks but has stopped due to his current symptoms.    BP is better with lower dose of amiodarone. He states he drinks salt water when he is feeling as if his BP is lower. Retakes BP and it normalizes within an hour. He denies bleeding or bruising. He denies significant dizziness or presyncope.    The patient does not have symptoms concerning for COVID-19 infection (fever, chills, cough, or new shortness of breath).    Hospital Course     Underwent cardiac cath noted above with PCI/DES x1 to the mLCx. Plan for DAPT with ASA/Plavix for at least 6 months. Will also be on Xarelto, therefore triple therapy for one month. Phone visit via cardiac rehab while in short stay. No complications post cath. Radial site stable . Instructions/precautions regarding cath site care given prior to discharge.    Jerry Barr was seen by Dr. Angelena Form and determined stable for discharge home. Follow up in the office has been arranged. Medications are listed below.   _____________  Discharge Vitals Blood pressure 136/65, pulse 82, temperature 98.2 F (36.8 C), resp. rate 18, height 6' (1.829 m), weight 65.3 kg, SpO2 97 %.  Filed Weights   11/15/18 0952  Weight: 65.3 kg    Labs & Radiologic Studies    CBC Recent Labs    11/14/18 0906  WBC 5.8  HGB 14.1  HCT 42.5  MCV 90  PLT 952   Basic Metabolic Panel Recent Labs    11/14/18 0906  NA 141  K 5.1  CL 105  CO2 29  GLUCOSE 91  BUN 23  CREATININE 1.04  CALCIUM 9.3   Liver Function Tests No results for input(s): AST, ALT, ALKPHOS, BILITOT, PROT, ALBUMIN in the last 72 hours. No results for input(s): LIPASE, AMYLASE in the last 72 hours. Cardiac Enzymes No results for input(s): CKTOTAL, CKMB, CKMBINDEX, TROPONINI in the last 72 hours. BNP Invalid input(s): POCBNP D-Dimer No results for input(s): DDIMER in the last 72 hours. Hemoglobin A1C No results for input(s): HGBA1C in the last 72 hours. Fasting Lipid Panel No results  for input(s): CHOL, HDL, LDLCALC, TRIG, CHOLHDL, LDLDIRECT in the last 72 hours. Thyroid Function Tests No results for input(s): TSH, T4TOTAL, T3FREE, THYROIDAB in the last 72 hours.  Invalid input(s): FREET3 _____________  No results found. Disposition   Pt is being discharged home today in good condition.  Follow-up Plans & Appointments    Follow-up Information    Almyra Deforest, [place] Follow up on 11/23/2018.   Specialties:  Cardiology, Radiology Why:  at 1pm for your follow up appt. This will be a telehealth visit through your mobile phone.  Contact information: [address] [place] [address] [phone]          Discharge Instructions    Amb Referral to Cardiac Rehabilitation   Complete by:  As directed    Diagnosis:   Coronary Stents PTCA         Discharge Medications     Medication List    TAKE these medications   acetaminophen 500 MG tablet Commonly known as:  TYLENOL Take 500 mg by mouth every  6 (six) hours as needed for headache (pain).   ALLERGY EYE DROPS OP Place 1 drop into both eyes daily as needed (allergies).   amiodarone 200 MG tablet Commonly known as:  PACERONE Take 1 tablet (200 mg total) by mouth daily. Take 1 tablet by mouth daily. What changed:  additional instructions   aspirin 81 MG chewable tablet Chew 1 tablet (81 mg total) by mouth daily. Start taking on:  Nov 16, 2018   atorvastatin 20 MG tablet Commonly known as:  LIPITOR Take 1 tablet (20 mg total) by mouth daily.   BENEFIBER PO Take 30 mLs by mouth daily.   clopidogrel 75 MG tablet Commonly known as:  Plavix Take 1 tablet (75 mg total) by mouth daily.   fluticasone 50 MCG/ACT nasal spray Commonly known as:  FLONASE Place 1 spray into both nostrils daily as needed (seasonal allergies).   loratadine 10 MG tablet Commonly known as:  CLARITIN Take 10 mg by mouth as needed for allergies.   Melatonin 3 MG Tabs Take 6 mg by mouth at bedtime.    midodrine 5 MG tablet Commonly known as:  PROAMATINE Take 5 mg by mouth daily.   rivaroxaban 20 MG Tabs tablet Commonly known as:  Xarelto TAKE 1 TABLET (20 MG TOTAL) BY MOUTH DAILY WITH SUPPER. What changed:    how much to take  how to take this  when to take this  additional instructions   Vitamin D-3 25 MCG (1000 UT) Caps Take 1,000 Units by mouth daily.        Acute coronary syndrome (MI, NSTEMI, STEMI, etc) this admission?: No.     Outstanding Labs/Studies   N/a   Duration of Discharge Encounter   Greater than 30 minutes including physician time.  Signed, Reino Bellis NP-C 11/15/2018, 2:51 PM

## 2018-11-15 NOTE — Progress Notes (Signed)
[facility] BAND REMOVAL  LOCATION:    right radial  DEFLATED PER PROTOCOL:    Yes.    TIME BAND OFF / DRESSING APPLIED:    1615   SITE UPON ARRIVAL:    Level 0  SITE AFTER BAND REMOVAL:    Level 0  CIRCULATION SENSATION AND MOVEMENT:    Within Normal Limits   Yes.    COMMENTS:   TRB removed. tegaderm dsg applied

## 2018-11-16 ENCOUNTER — Encounter (HOSPITAL_COMMUNITY): Payer: Self-pay | Admitting: Cardiovascular Disease

## 2018-11-17 ENCOUNTER — Other Ambulatory Visit: Payer: Self-pay

## 2018-11-17 ENCOUNTER — Ambulatory Visit (HOSPITAL_COMMUNITY)
Admission: RE | Admit: 2018-11-17 | Discharge: 2018-11-17 | Disposition: A | Discharge status: Home / Self Care | Payer: Medicare Other | Source: Ambulatory Visit | Attending: Physician Assistant | Admitting: Physician Assistant

## 2018-11-17 ENCOUNTER — Encounter (HOSPITAL_COMMUNITY): Payer: Self-pay | Admitting: Physician Assistant

## 2018-11-17 VITALS — BP 108/61 | HR 66 | Ht 72.0 in | Wt 143.0 lb

## 2018-11-17 DIAGNOSIS — I480 Paroxysmal atrial fibrillation: Secondary | ICD-10-CM

## 2018-11-17 NOTE — Progress Notes (Signed)
Electrophysiology TeleHealth Note   Due to national recommendations of social distancing due to [place] 19, Audio/video telehealth visit is felt to be most appropriate for this patient at this time.  See consent below from today for patient consent regarding telehealth for the Atrial Fibrillation Clinic. Consent obtained verbally   Date:  11/17/2018   ID:  Jerry Barr, DOB 1941-04-09, MRN [id number]  Location: home  Provider location: [address] [place], [place] [address] Evaluation Performed: Follow up  PCP:  Antony Contras, MD  Primary Cardiologist:  Dr Martinique  CC: Evaluation of atrial fibrillation   History of Present Illness: Jerry Barr is a 78 y.o. male who presents via audio/video conferencing for a telehealth visit today.   The patient is referred for consultation regarding paroxysmal atrial fibrillation by [facility]. Patient has a PMH of paroxysmal atrial fibrillation, CAD s/p DES to LAD, BPH, HLD, and chronic hypotension on midodrine. Patient has been maintained on amiodarone with no symptomatic episodes of afib since 2017 until 10/15/18 when patient presented to ER with weakness, irregular heart rate, and chest discomfort. He was found to be in afib with rapid rates on exertion. He underwent successful DCCV and his amiodarone was increased to 200 mg daily. He denies significant snoring or alcohol intake.  Patient reports that after his last visit he felt well for several days until he developed neck and shoulder pain with exertion. This was concerning for anginal pain as this was similar to pain he had prior to previous stenting. He also noted nausea and weight loss with higher dose of amiodarone. Patient underwent elective cardiac catheterization on 11/15/18 and is s/p DES to circumflex artery. He reports feeling much improved with no pain or fatigue on exertion. Of note, he was in SR when he presented for catheterization.   Today, he denies symptoms of palpitations,  chest pain, shortness of breath, orthopnea, PND, lower extremity edema, claudication, dizziness, presyncope, syncope, bleeding, or neurologic sequela. The patient is tolerating medications without difficulties and is otherwise without complaint today.   he denies symptoms of cough, fevers, chills, or new SOB worrisome for COVID 19.     Atrial Fibrillation Risk Factors:  he does not have symptoms or diagnosis of sleep apnea. he does not have a history of alcohol use. The patient does not have a history of early familial atrial fibrillation or other arrhythmias.  he has a BMI of Body mass index is 19.39 kg/m..  BP 108/61 Pulse 66 Provided by patient using home BP machine.  Past Medical History:  Diagnosis Date  . Allergy   . BPH (benign prostatic hyperplasia)   . Cancer (HCC)    basal cell ca - face, nose and right leg  . Clotting disorder (HCC)    a fib hx - Xarelto  . Coronary artery disease    Promus drug-eluting stent to a 99% mid LAD, 40-50% narrowing in the distal LAD with mild to moderate disease in the circ and RCA b. 5/6 PCI/DESx1 to mLcx  . Dyslipidemia   . ED (erectile dysfunction)   . Hyperplastic colon polyp   . Hypoglycemia    Past Surgical History:  Procedure Laterality Date  . CARDIOVERSION N/A 04/23/2016   Procedure: CARDIOVERSION;  Surgeon: [name], MD;  Location[facility] ENDOSCOPY;  Service: Cardiovascular;  Laterality: N/A;  . COLONOSCOPY    . CORONARY ANGIOPLASTY WITH STENT PLACEMENT    . CORONARY STENT INTERVENTION N/A 11/15/2018   Procedure: CORONARY STENT  INTERVENTION;  Surgeon: Burnell Blanks, MD;  Location: [place] CV LAB;  Service: Cardiovascular;  Laterality: N/A;  . LEFT HEART CATH AND CORONARY ANGIOGRAPHY N/A 11/15/2018   Procedure: LEFT HEART CATH AND CORONARY ANGIOGRAPHY;  Surgeon: Burnell Blanks, MD;  Location: [place] CV LAB;  Service: Cardiovascular;  Laterality: N/A;  . POLYPECTOMY    . TEE WITHOUT CARDIOVERSION N/A  04/23/2016   Procedure: TRANSESOPHAGEAL ECHOCARDIOGRAM (TEE);  Surgeon: [name], MD;  Location: [facility] ENDOSCOPY;  Service: Cardiovascular;  Laterality: N/A;  . UPPER GASTROINTESTINAL ENDOSCOPY       Current Outpatient Medications  Medication Sig Dispense Refill  . acetaminophen (TYLENOL) 500 MG tablet Take 500 mg by mouth every 6 (six) hours as needed for headache (pain).    Marland Kitchen amiodarone (PACERONE) 200 MG tablet Take 1 tablet (200 mg total) by mouth daily. Take 1 tablet by mouth daily. (Patient taking differently: Take 200 mg by mouth daily. ) 90 tablet 1  . aspirin 81 MG chewable tablet Chew 1 tablet (81 mg total) by mouth daily.    Marland Kitchen atorvastatin (LIPITOR) 20 MG tablet Take 1 tablet (20 mg total) by mouth daily. 90 tablet 1  . Cholecalciferol (VITAMIN D-3) 1000 UNITS CAPS Take 1,000 Units by mouth daily.    . clopidogrel (PLAVIX) 75 MG tablet Take 1 tablet (75 mg total) by mouth daily. 30 tablet 2  . fluticasone (FLONASE) 50 MCG/ACT nasal spray Place 1 spray into both nostrils daily as needed (seasonal allergies).     . Ketotifen Fumarate (ALLERGY EYE DROPS OP) Place 1 drop into both eyes daily as needed (allergies).    . loratadine (CLARITIN) 10 MG tablet Take 10 mg by mouth as needed for allergies.     . Melatonin 3 MG TABS Take 6 mg by mouth at bedtime.     . midodrine (PROAMATINE) 5 MG tablet Take 5 mg by mouth daily.     . rivaroxaban (XARELTO) 20 MG TABS tablet TAKE 1 TABLET (20 MG TOTAL) BY MOUTH DAILY WITH SUPPER. (Patient taking differently: Take 20 mg by mouth daily. ) 90 tablet 1  . Wheat Dextrin (BENEFIBER PO) Take 30 mLs by mouth daily.     No current facility-administered medications for this encounter.     Allergies:   Penicillins   Social History:  The patient  reports that he has never smoked. He has never used smokeless tobacco. He reports current alcohol use of about 5.0 standard drinks of alcohol per week. He reports that he does not use drugs.   Family History:   The patient's  family history includes Bone cancer in his paternal grandfather; Breast cancer in his mother; CAD in his father; Cancer in his brother; Colon cancer in his brother and mother; Esophageal cancer in his brother; Heart attack in his paternal grandmother; Leukemia in his paternal uncle; Liver cancer in his brother.    ROS:  Please see the history of present illness.   All other systems are personally reviewed and negative.   Exam: Well appearing, alert and conversant, regular work of breathing, good skin color.  Recent Labs: 10/15/2018: ALT 38; Magnesium 2.2 11/14/2018: BUN 23; Creatinine, Ser 1.04; Hemoglobin 14.1; Platelets 213; Potassium 5.1; Sodium 141  personally reviewed    Other studies personally reviewed: Additional studies/ records that were reviewed today include: Epic notes    ASSESSMENT AND PLAN:  1.  Paroxysmal atrial fibrillation S/p DCCV 10/15/18. Appears to be maintaining SR. Continue amiodarone 200 mg  daily. Had GI side effects on higher dose. Continue Xarelto 20 mg daily No rate control 2/2 chronic hypotension. Will check TSH and LFTs on f/u.  This patients CHA2DS2-VASc Score and unadjusted Ischemic Stroke Rate (% per year) is equal to 3.2 % stroke rate/year from a score of 3  Above score calculated as 1 point each if present [CHF, HTN, DM, Vascular=MI/PAD/Aortic Plaque, Age if 65-74, or Male] Above score calculated as 2 points each if present [Age > 75, or Stroke/TIA/TE]  2. CAD S/p DES to LAD 2011 and DES to mid circumflex 11/15/18. No anginal symptoms. Continue present therapy and risk factor modification. Currently on triple therapy. DAPT for at least one month. Plans per Dr Martinique.   COVID screen The patient does not have any symptoms that suggest any further testing/ screening at this time.  Social distancing reinforced today.    Follow-up with Almyra Deforest and Dr Martinique as scheduled. AF clinic in 5-6 months.   Current medicines are reviewed at  length with the patient today.   The patient does not have concerns regarding his medicines.  The following changes were made today:  none  Labs/ tests ordered today include:  No orders of the defined types were placed in this encounter.   Patient Risk:  after full review of this patients clinical status, I feel that they are at moderate risk at this time.   Today, I have spent 14 minutes with the patient with telehealth technology discussing atrial fibrillation, lifestyle modifications, anticoagulation and antiplatelet therapy, and COVID-19 precautions.   Gwenlyn Perking PA-C 11/17/2018 2:18 PM  [facility] [address] [place], [place] [address] [phone]

## 2018-11-22 ENCOUNTER — Telehealth: Payer: Self-pay | Admitting: Physician Assistant

## 2018-11-23 ENCOUNTER — Telehealth (INDEPENDENT_AMBULATORY_CARE_PROVIDER_SITE_OTHER): Payer: Medicare Other | Admitting: Physician Assistant

## 2018-11-23 ENCOUNTER — Encounter: Payer: Self-pay | Admitting: Physician Assistant

## 2018-11-23 ENCOUNTER — Telehealth: Payer: Self-pay

## 2018-11-23 VITALS — BP 86/52 | HR 72 | Wt 143.0 lb

## 2018-11-23 DIAGNOSIS — E785 Hyperlipidemia, unspecified: Secondary | ICD-10-CM

## 2018-11-23 DIAGNOSIS — I2510 Atherosclerotic heart disease of native coronary artery without angina pectoris: Secondary | ICD-10-CM | POA: Diagnosis not present

## 2018-11-23 DIAGNOSIS — I480 Paroxysmal atrial fibrillation: Secondary | ICD-10-CM

## 2018-11-23 DIAGNOSIS — I959 Hypotension, unspecified: Secondary | ICD-10-CM

## 2018-11-23 NOTE — Telephone Encounter (Signed)
Virtual Visit Pre-Appointment Phone Call  "(Name), I am calling you today to discuss your upcoming appointment. We are currently trying to limit exposure to the virus that causes COVID-19 by seeing patients at home rather than in the office."  1. "What is the BEST phone number to call the day of the visit?" - include this in appointment notes  2. "Do you have or have access to (through a family member/friend) a smartphone with video capability that we can use for your visit?" a. If yes - list this number in appt notes as "cell" (if different from BEST phone #) and list the appointment type as a VIDEO visit in appointment notes b. If no - list the appointment type as a PHONE visit in appointment notes  3. Confirm consent - "In the setting of the current Covid19 crisis, you are scheduled for a PHONE visit with your provider on 11/23/2018 at 1:00PM.  Just as we do with many in-office visits, in order for you to participate in this visit, we must obtain consent.  If you'd like, I can send this to your mychart (if signed up) or email for you to review.  Otherwise, I can obtain your verbal consent now.  All virtual visits are billed to your insurance company just like a normal visit would be.  By agreeing to a virtual visit, we'd like you to understand that the technology does not allow for your provider to perform an examination, and thus may limit your provider's ability to fully assess your condition. If your provider identifies any concerns that need to be evaluated in person, we will make arrangements to do so.  Finally, though the technology is pretty good, we cannot assure that it will always work on either your or our end, and in the setting of a video visit, we may have to convert it to a phone-only visit.  In either situation, we cannot ensure that we have a secure connection.  Are you willing to proceed?" STAFF: Did the patient verbally acknowledge consent to telehealth visit? Document YES/NO  here: YES  4. Advise patient to be prepared - "Two hours prior to your appointment, go ahead and check your blood pressure, pulse, oxygen saturation, and your weight (if you have the equipment to check those) and write them all down. When your visit starts, your provider will ask you for this information. If you have an Apple Watch or Kardia device, please plan to have heart rate information ready on the day of your appointment. Please have a pen and paper handy nearby the day of the visit as well."  5. Give patient instructions for MyChart download to smartphone OR Doximity/[email or web address] as below if video visit (depending on what platform provider is using)  6. Inform patient they will receive a phone call 15 minutes prior to their appointment time (may be from unknown caller ID) so they should be prepared to answer    TELEPHONE CALL NOTE  Jerry Barr has been deemed a candidate for a follow-up tele-health visit to limit community exposure during the Covid-19 pandemic. I spoke with the patient via phone to ensure availability of phone/video source, confirm preferred email & phone number, and discuss instructions and expectations.  I reminded Jerry Barr to be prepared with any vital sign and/or heart rhythm information that could potentially be obtained via home monitoring, at the time of his visit. I reminded Jerry Barr to expect a phone call prior to his visit.  Jacqulynn Cadet,  CMA 11/23/2018 1:00 PM   INSTRUCTIONS FOR DOWNLOADING THE MYCHART APP TO SMARTPHONE  - The patient must first make sure to have activated MyChart and know their login information - If Apple, go to [employer] and type in MyChart in the search bar and download the app. If Android, ask patient to go to [employer] and type in [place] in the search bar and download the app. The app is free but as with any other app downloads, their phone may require them to verify saved payment information or Apple/Android  password.  - The patient will need to then log into the app with their MyChart username and password, and select [facility] as their healthcare provider to link the account. When it is time for your visit, go to the MyChart app, find appointments, and click Begin Video Visit. Be sure to Select Allow for your device to access the Microphone and Camera for your visit. You will then be connected, and your provider will be with you shortly.  **If they have any issues connecting, or need assistance please contact MyChart service desk [phone])**  **If using a computer, in order to ensure the best quality for their visit they will need to use either of the following Internet Browsers: [employer], or Google Chrome**  IF USING DOXIMITY or DOXY.ME - The patient will receive a link just prior to their visit by text.     FULL LENGTH CONSENT FOR TELE-HEALTH VISIT   I hereby voluntarily request, consent and authorize [facility] and its employed or contracted physicians, physician assistants, nurse practitioners or other licensed health care professionals (the Practitioner), to provide me with telemedicine health care services (the "Services") as deemed necessary by the treating Practitioner. I acknowledge and consent to receive the Services by the Practitioner via telemedicine. I understand that the telemedicine visit will involve communicating with the Practitioner through live audiovisual communication technology and the disclosure of certain medical information by electronic transmission. I acknowledge that I have been given the opportunity to request an in-person assessment or other available alternative prior to the telemedicine visit and am voluntarily participating in the telemedicine visit.  I understand that I have the right to withhold or withdraw my consent to the use of telemedicine in the course of my care at any time, without affecting my right to future care or treatment,  and that the Practitioner or I may terminate the telemedicine visit at any time. I understand that I have the right to inspect all information obtained and/or recorded in the course of the telemedicine visit and may receive copies of available information for a reasonable fee.  I understand that some of the potential risks of receiving the Services via telemedicine include:  Marland Kitchen Delay or interruption in medical evaluation due to technological equipment failure or disruption; . Information transmitted may not be sufficient (e.g. poor resolution of images) to allow for appropriate medical decision making by the Practitioner; and/or  . In rare instances, security protocols could fail, causing a breach of personal health information.  Furthermore, I acknowledge that it is my responsibility to provide information about my medical history, conditions and care that is complete and accurate to the best of my ability. I acknowledge that Practitioner's advice, recommendations, and/or decision may be based on factors not within their control, such as incomplete or inaccurate data provided by me or distortions of diagnostic images or specimens that may result from electronic transmissions. I understand that the practice of  medicine is not an Chief Strategy Officer and that Practitioner makes no warranties or guarantees regarding treatment outcomes. I acknowledge that I will receive a copy of this consent concurrently upon execution via email to the email address I last provided but may also request a printed copy by calling the office of [facility].    I understand that my insurance will be billed for this visit.   I have read or had this consent read to me. . I understand the contents of this consent, which adequately explains the benefits and risks of the Services being provided via telemedicine.  . I have been provided ample opportunity to ask questions regarding this consent and the Services and have had my questions  answered to my satisfaction. . I give my informed consent for the services to be provided through the use of telemedicine in my medical care  By participating in this telemedicine visit I agree to the above.

## 2018-11-23 NOTE — Progress Notes (Signed)
Virtual Visit via Telephone Note   This visit type was conducted due to national recommendations for restrictions regarding the COVID-19 Pandemic (e.g. social distancing) in an effort to limit this patient's exposure and mitigate transmission in our community.  Due to his co-morbid illnesses, this patient is at least at moderate risk for complications without adequate follow up.  This format is felt to be most appropriate for this patient at this time.  The patient did not have access to video technology/had technical difficulties with video requiring transitioning to audio format only (telephone).  All issues noted in this document were discussed and addressed.  No physical exam could be performed with this format.  Please refer to the patient's chart for his  consent to telehealth for [facility].   Date:  11/25/2018   ID:  Jerry Barr, DOB 05/04/1941, MRN [id number]  Patient Location: Home Provider Location: Home  PCP:  Antony Contras, MD  Cardiologist:  Peter Martinique, MD  Electrophysiologist:  None   Evaluation Performed:  Follow-Up Visit  Chief Complaint: Follow-up  History of Present Illness:    Jerry Barr is a 78 y.o. male with PMH of HLD, chronic hypotension, PAF on Xarelto, CAD s/p DES to LAD 2011 in [place] and BPH. He was diagnosed with new afib in 04/2016. Echo in 04/2016 showed EF 50-55%, no RWMA, mild MR. He had successful TEE DCCV 04/23/2016. He was placed on midodrine for orthostatic hypotension. He is not on any rate control therapy for the same reason. Myoview  In 06/2017 showed EF 55%, no significant ischemia or scar. Echo in 06/2017 EF 45-50%, mild diffuse hypokinesis, PA peak pressure 61mmHg.  Patient was recently seen in the ED with recurrent afib and fatigue. Given his history of orthostatic hypotension, it was elected to proceed with cardioversion in the ED. This was performed by Dr. Haroldine Laws. Post cardioversion, his amiodarone was increased to 200mg  BID.  Since discharge, he was seen in the afib clinic, it is planned to consider decreasing amiodarone back to 200mg  daily after 1 month if no recurrent afib.   I last saw the patient on 10/30/2018 for post hospital follow-up, at which time he began to mention shoulder blade pain that occur with brisk walking 20 underwent cardiac catheterization on 11/15/2018 which revealed 20% proximal RCA, 90% mid left circumflex lesion treated with a 2.25 x 16 mm Synergy DES, EF 55 to 65%.  Postprocedure, patient was discharged on aspirin, Plavix and Xarelto.  The plan is to stop aspirin after 1 month.  He will require a minimum of 6 months of Plavix.   Patient was contacted today via telephone visit for follow-up.  Since recent PCI, he has not had any further chest discomfort or shoulder blade pain.  His symptom seems to have completely resolved.  He is aware that he will need to discontinue aspirin after 1 month.  After which he will continue on the Plavix and the Xarelto.  He denies any lower extremity edema, orthopnea or PND.  Overall, I think he is doing quite well from cardiology perspective.  The patient does not have symptoms concerning for COVID-19 infection (fever, chills, cough, or new shortness of breath).    Past Medical History:  Diagnosis Date  . Allergy   . BPH (benign prostatic hyperplasia)   . Cancer (HCC)    basal cell ca - face, nose and right leg  . Clotting disorder (HCC)    a fib hx - Xarelto  . Coronary artery  disease    Promus drug-eluting stent to a 99% mid LAD, 40-50% narrowing in the distal LAD with mild to moderate disease in the circ and RCA b. 5/6 PCI/DESx1 to mLcx  . Dyslipidemia   . ED (erectile dysfunction)   . Hyperplastic colon polyp   . Hypoglycemia    Past Surgical History:  Procedure Laterality Date  . CARDIOVERSION N/A 04/23/2016   Procedure: CARDIOVERSION;  Surgeon: [name], MD;  Location: [facility] ENDOSCOPY;  Service: Cardiovascular;  Laterality: N/A;  . COLONOSCOPY    .  CORONARY ANGIOPLASTY WITH STENT PLACEMENT    . CORONARY STENT INTERVENTION N/A 11/15/2018   Procedure: CORONARY STENT INTERVENTION;  Surgeon: Burnell Blanks, MD;  Location: [place] CV LAB;  Service: Cardiovascular;  Laterality: N/A;  . LEFT HEART CATH AND CORONARY ANGIOGRAPHY N/A 11/15/2018   Procedure: LEFT HEART CATH AND CORONARY ANGIOGRAPHY;  Surgeon: Burnell Blanks, MD;  Location: [place] CV LAB;  Service: Cardiovascular;  Laterality: N/A;  . POLYPECTOMY    . TEE WITHOUT CARDIOVERSION N/A 04/23/2016   Procedure: TRANSESOPHAGEAL ECHOCARDIOGRAM (TEE);  Surgeon: [name], MD;  Location: [facility] ENDOSCOPY;  Service: Cardiovascular;  Laterality: N/A;  . UPPER GASTROINTESTINAL ENDOSCOPY       Current Meds  Medication Sig  . acetaminophen (TYLENOL) 500 MG tablet Take 500 mg by mouth every 6 (six) hours as needed for headache (pain).  Marland Kitchen amiodarone (PACERONE) 200 MG tablet Take 1 tablet (200 mg total) by mouth daily. Take 1 tablet by mouth daily. (Patient taking differently: Take 200 mg by mouth daily. )  . aspirin 81 MG chewable tablet Chew 1 tablet (81 mg total) by mouth daily.  Marland Kitchen atorvastatin (LIPITOR) 20 MG tablet Take 1 tablet (20 mg total) by mouth daily.  . Cholecalciferol (VITAMIN D-3) 1000 UNITS CAPS Take 1,000 Units by mouth daily.  . clopidogrel (PLAVIX) 75 MG tablet Take 1 tablet (75 mg total) by mouth daily.  . Melatonin 3 MG TABS Take 6 mg by mouth at bedtime.   . midodrine (PROAMATINE) 5 MG tablet Take 5 mg by mouth daily.   . rivaroxaban (XARELTO) 20 MG TABS tablet TAKE 1 TABLET (20 MG TOTAL) BY MOUTH DAILY WITH SUPPER. (Patient taking differently: Take 20 mg by mouth daily. )  . Wheat Dextrin (BENEFIBER PO) Take 30 mLs by mouth daily.     Allergies:   Penicillins   Social History   Tobacco Use  . Smoking status: Never Smoker  . Smokeless tobacco: Never Used  Substance Use Topics  . Alcohol use: Yes    Alcohol/week: 5.0 standard drinks    Types: 5  Glasses of wine per week    Comment: red wine  . Drug use: No     Family Hx: The patient's family history includes Bone cancer in his paternal grandfather; Breast cancer in his mother; CAD in his father; Cancer in his brother; Colon cancer in his brother and mother; Esophageal cancer in his brother; Heart attack in his paternal grandmother; Leukemia in his paternal uncle; Liver cancer in his brother. There is no history of Pancreatic cancer, Prostate cancer, Rectal cancer, or Stomach cancer.  ROS:   Please see the history of present illness.     All other systems reviewed and are negative.   Prior CV studies:   The following studies were reviewed today:  Cath 11/15/2018  Prox RCA lesion is 20% stenosed.  Mid Cx lesion is 90% stenosed.  Previously placed Prox LAD to  Mid LAD stent (unknown type) is widely patent.  A drug-eluting stent was successfully placed using a STENT SYNERGY DES 2.25X16.  Post intervention, there is a 0% residual stenosis.  The left ventricular systolic function is normal.  LV end diastolic pressure is normal.  The left ventricular ejection fraction is 55-65% by visual estimate.  There is no mitral valve regurgitation.   1. Patent mid LAD stent without restenosis 2. Severe stenosis mid Circumflex artery.  3. Successful PTCA/DES x 1 mid Circumflex 4. Mild non-obstructive disease in the RCA 5. Normal LV systolic function  Recommendations: ASA and Plavix for one month then stop ASA. Will restart Xarelto tomorrow. Would recommend at least six month of Plavix and Xarelto but could consider stopping Plavix at 6 months if necessary. Likely same day PCI discharge.    Labs/Other Tests and Data Reviewed:    EKG:  An ECG dated 11/15/2018 was personally reviewed today and demonstrated:  Normal sinus rhythm without significant ST-T wave changes.  Recent Labs: 10/15/2018: ALT 38; Magnesium 2.2 11/14/2018: BUN 23; Creatinine, Ser 1.04; Hemoglobin 14.1; Platelets 213;  Potassium 5.1; Sodium 141   Recent Lipid Panel Lab Results  Component Value Date/Time   CHOL 143 04/19/2018 08:07 AM   TRIG 40 04/19/2018 08:07 AM   HDL 55 04/19/2018 08:07 AM   CHOLHDL 2.6 04/19/2018 08:07 AM   CHOLHDL 3.0 06/29/2016 10:11 AM   LDLCALC 80 04/19/2018 08:07 AM    Wt Readings from Last 3 Encounters:  11/23/18 143 lb (64.9 kg)  11/17/18 143 lb (64.9 kg)  11/15/18 144 lb (65.3 kg)     Objective:    Vital Signs:  BP (!) 86/52 (Patient Position: Standing)   Pulse 72   Wt 143 lb (64.9 kg)   BMI 19.39 kg/m    VITAL SIGNS:  reviewed  ASSESSMENT & PLAN:    1. CAD: Recently underwent PCI.  His shoulder blade pain with walking has completely resolved since the stent placement.  He is on triple therapy with aspirin, Plavix and Xarelto.  He will continue aspirin for 1 month then stop.  2. PAF: Recently underwent cardioversion.  Continue Xarelto and amiodarone therapy.  Unable to tolerate any rate control therapy given history of chronic hypotension.  3. Chronic hypotension: His blood pressure remains low during the day, he is on Midrin.  I think his current blood pressure is stable and that he does not exhibit any significant symptoms of hypotension  4. Hyperlipidemia: Continue current statin  COVID-19 Education: The signs and symptoms of COVID-19 were discussed with the patient and how to seek care for testing (follow up with PCP or arrange E-visit).  The importance of social distancing was discussed today.  Time:   Today, I have spent 20 minutes with the patient with telehealth technology discussing the above problems.     Medication Adjustments/Labs and Tests Ordered: Current medicines are reviewed at length with the patient today.  Concerns regarding medicines are outlined above.   Tests Ordered: No orders of the defined types were placed in this encounter.   Medication Changes: No orders of the defined types were placed in this encounter.    Disposition:  Follow up in 3 month(s)  Signed, Almyra Deforest, [place]  11/25/2018 11:42 PM    [facility]

## 2018-11-23 NOTE — Patient Instructions (Signed)
Medication Instructions:   STOP taking Aspirin 81 mg daily after December 16, 2018  If you need a refill on your cardiac medications before your next appointment, please call your pharmacy.   Lab work:  NONE ordered at this time of appointment   If you have labs (blood work) drawn today and your tests are completely normal, you will receive your results only by: Marland Kitchen MyChart Message (if you have MyChart) OR . A paper copy in the mail If you have any lab test that is abnormal or we need to change your treatment, we will call you to review the results.  Testing/Procedures:  NONE ordered at this time of appointment   Follow-Up: At [facility], you and your health needs are our priority.  As part of our continuing mission to provide you with exceptional heart care, we have created designated Provider Care Teams.  These Care Teams include your primary Cardiologist (physician) and Advanced Practice Providers (APPs -  Physician Assistants and Nurse Practitioners) who all work together to provide you with the care you need, when you need it. You will need a follow up appointment in 3 months.  Please call our office 2 months in advance to schedule this appointment.  You may see Peter Martinique, MD or one of the following Advanced Practice Providers on your designated Care Team: [place], [place] . Fabian Sharp, PA-C  Any Other Special Instructions Will Be Listed Below (If Applicable).

## 2018-11-27 ENCOUNTER — Telehealth (HOSPITAL_COMMUNITY): Payer: Self-pay | Admitting: *Deleted

## 2018-11-27 NOTE — Telephone Encounter (Signed)
Pt returned my call and remains interested in participating in CR when permitted to schedule. Pt aware of continued closure. Pt avid jogger and walker prior to cardiac event for stent placement and afib.  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  Will send pt warm up and cool down stretches along with bands.  Pt has handweights at home 1-10 pounds. Advised pt to limit his handweight to 5 pounds or less while at home until we have the opportunity to evaluate vital signs and ekg tracing.  Pt would also like to gain weight. Pt stated he lost 7 unintended pounds and would like to gain this back.   Will send him information on gaining weight. Pt aware will contact him once we are permitted to schedule. Cherre Huger, BSN Cardiac and Training and development officer

## 2018-11-28 ENCOUNTER — Other Ambulatory Visit: Payer: Self-pay | Admitting: Dermatology

## 2018-11-28 DIAGNOSIS — Z7901 Long term (current) use of anticoagulants: Secondary | ICD-10-CM | POA: Diagnosis not present

## 2018-11-28 DIAGNOSIS — L570 Actinic keratosis: Secondary | ICD-10-CM | POA: Diagnosis not present

## 2018-11-28 DIAGNOSIS — C44319 Basal cell carcinoma of skin of other parts of face: Secondary | ICD-10-CM | POA: Diagnosis not present

## 2018-11-28 DIAGNOSIS — R042 Hemoptysis: Secondary | ICD-10-CM | POA: Diagnosis not present

## 2018-11-28 DIAGNOSIS — C44311 Basal cell carcinoma of skin of nose: Secondary | ICD-10-CM | POA: Diagnosis not present

## 2018-11-28 DIAGNOSIS — R0982 Postnasal drip: Secondary | ICD-10-CM | POA: Diagnosis not present

## 2018-12-01 DIAGNOSIS — R634 Abnormal weight loss: Secondary | ICD-10-CM | POA: Diagnosis not present

## 2018-12-01 DIAGNOSIS — I951 Orthostatic hypotension: Secondary | ICD-10-CM | POA: Diagnosis not present

## 2018-12-01 DIAGNOSIS — Z7189 Other specified counseling: Secondary | ICD-10-CM | POA: Diagnosis not present

## 2018-12-01 DIAGNOSIS — K068 Other specified disorders of gingiva and edentulous alveolar ridge: Secondary | ICD-10-CM | POA: Diagnosis not present

## 2018-12-01 IMAGING — DX DG CHEST 2V
4 series · 4 of 4 positions shown · non-contrast
Comparison: May 24, 2016

CLINICAL DATA: Lower chest pressure.

EXAM:
CHEST  2 VIEW

[chest lat (1 of 2)]
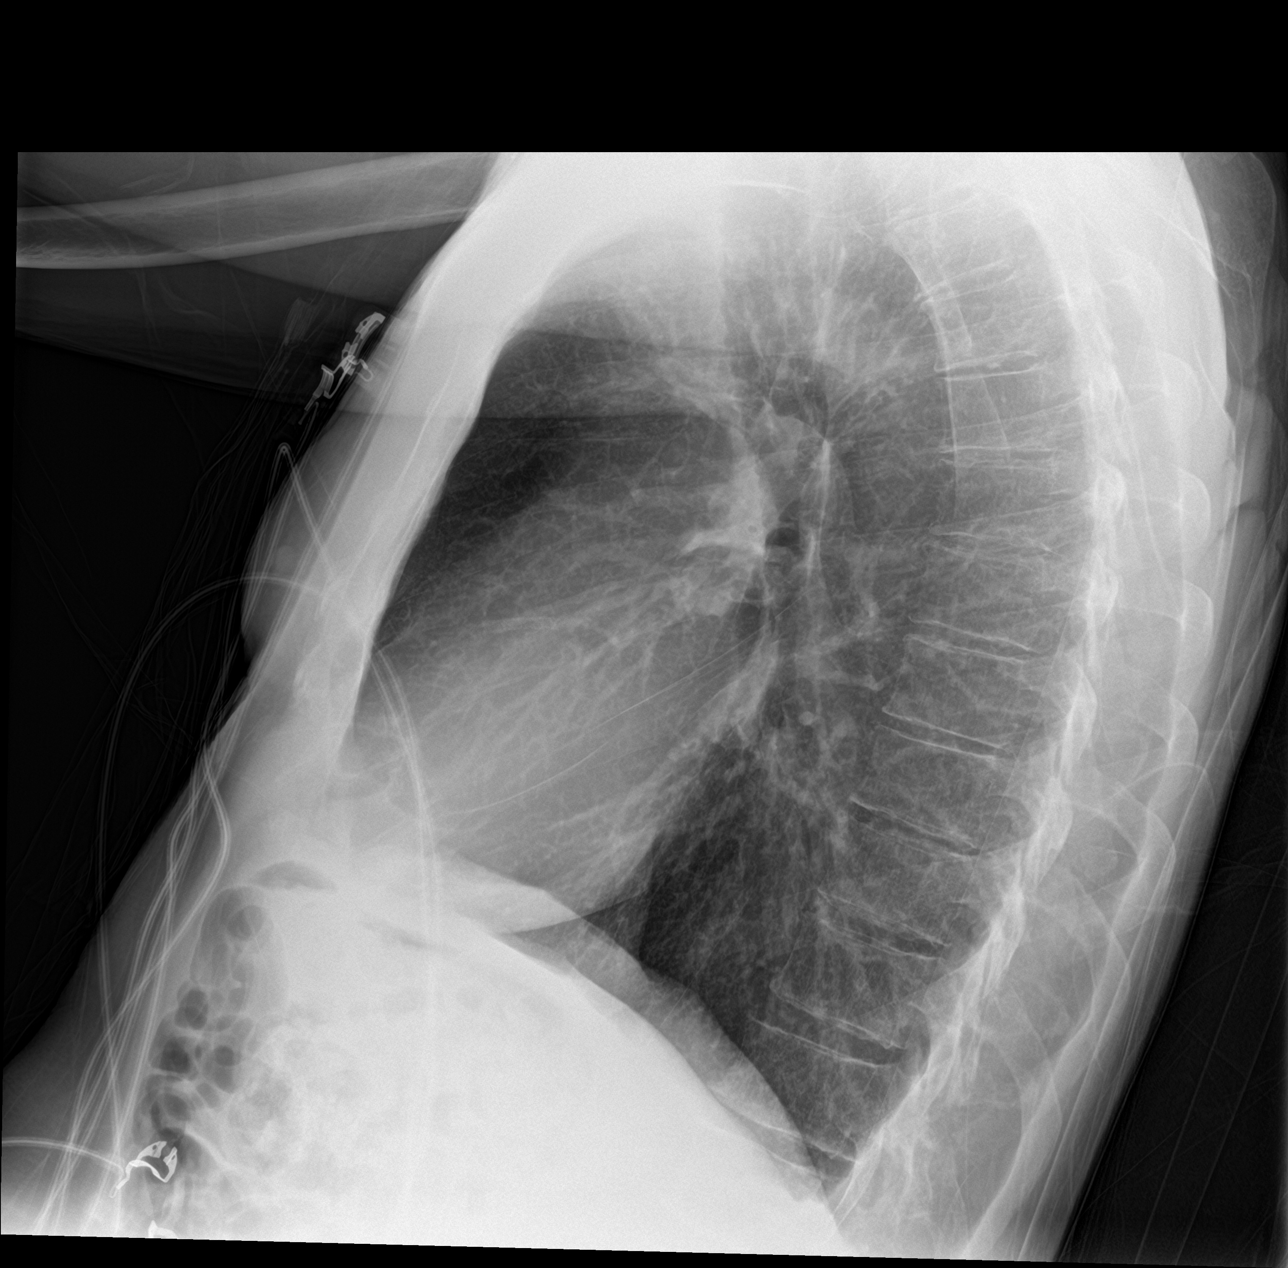

[chest ap (1 of 2)]
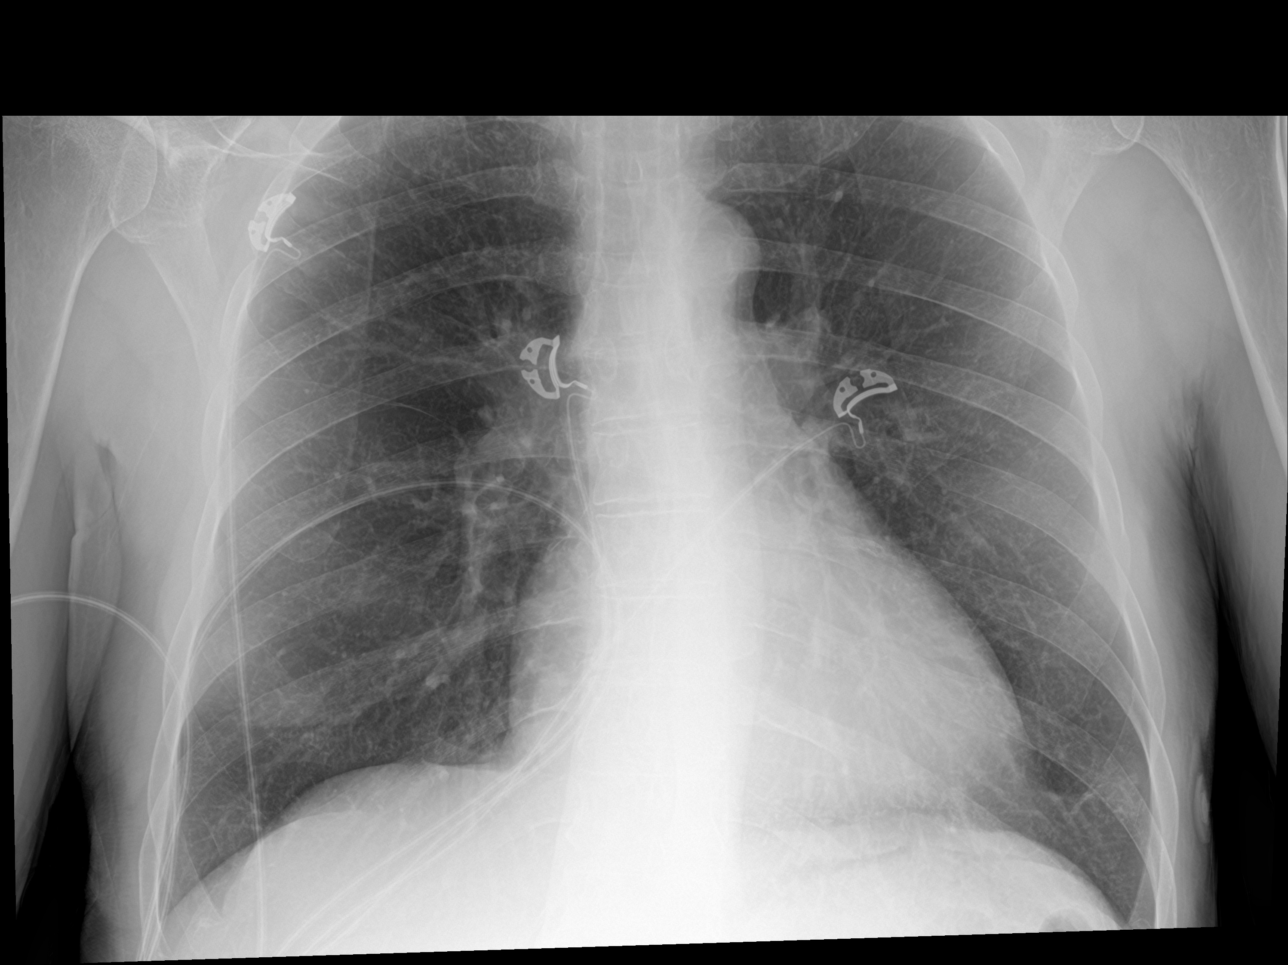

[chest ap (2 of 2)]
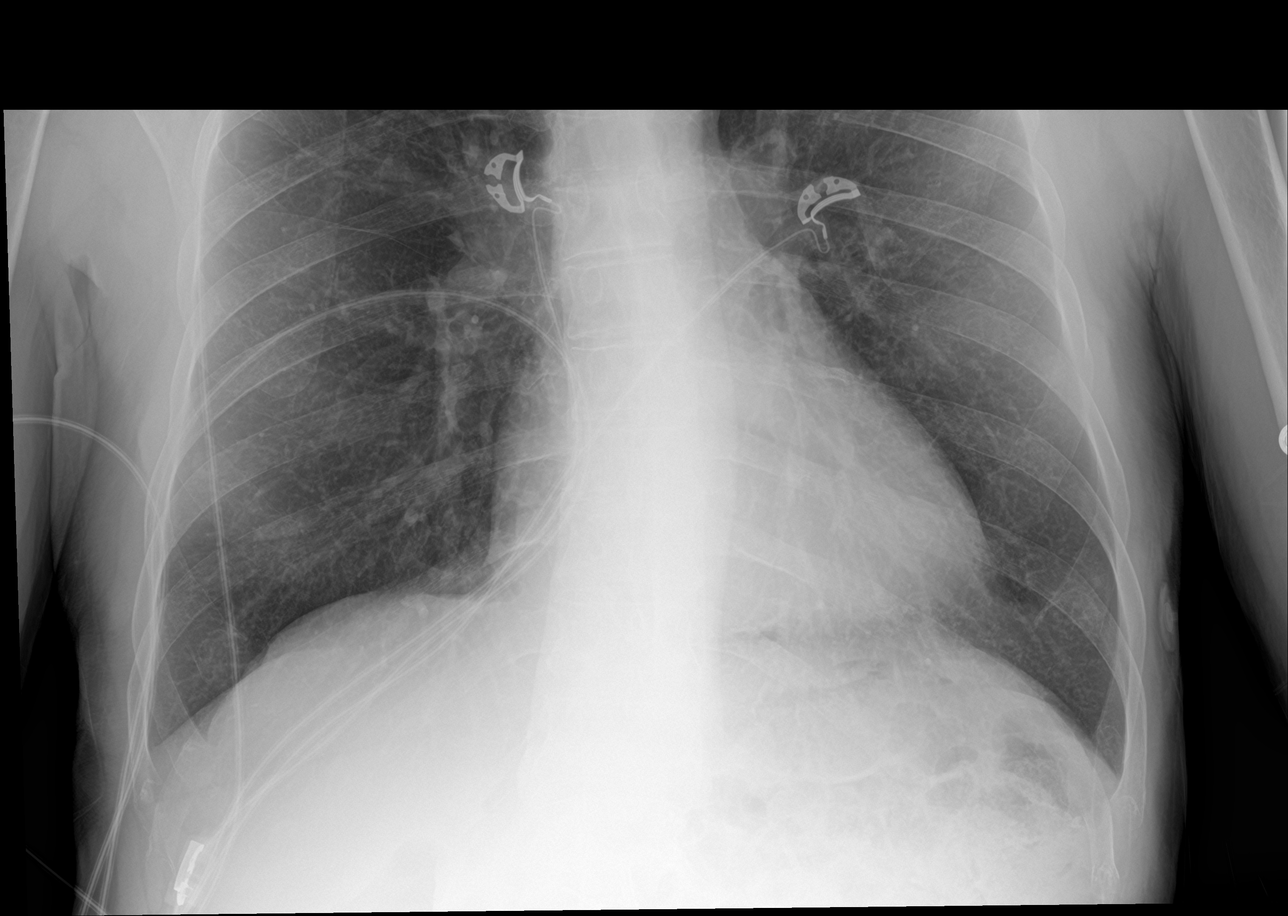

[chest lat (2 of 2)]
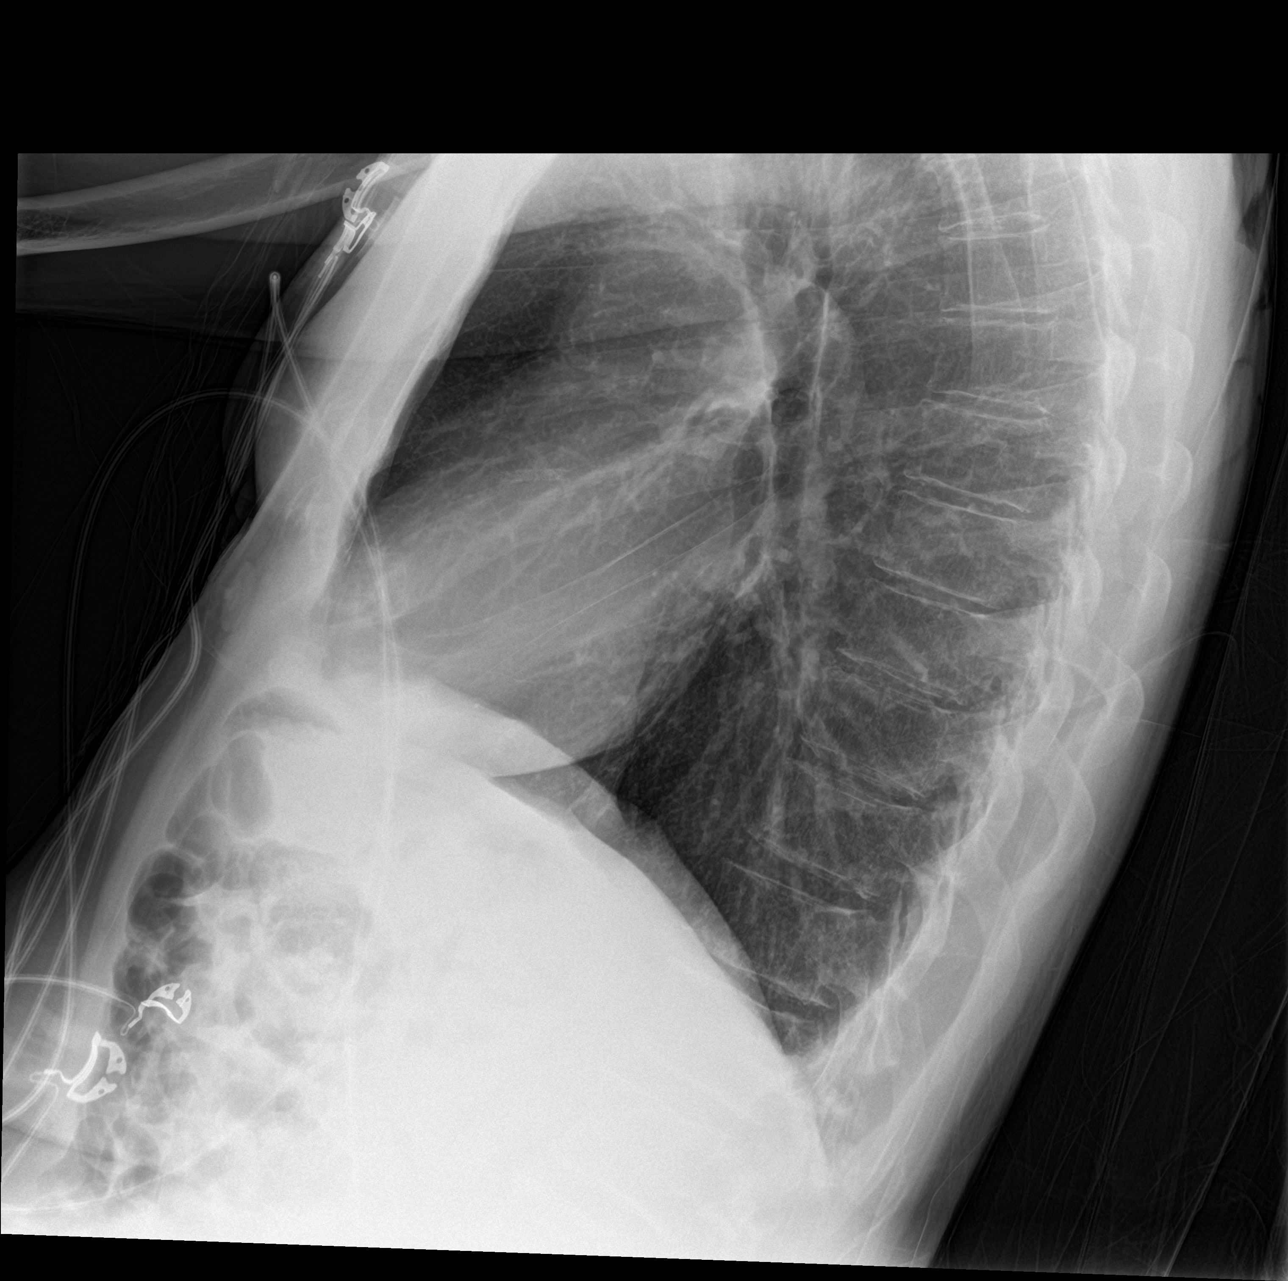

[4 of 4 positions shown; findings below may reference images not displayed]

FINDINGS: The heart size and mediastinal contours are within normal limits.
Both lungs are clear. The visualized skeletal structures are
unremarkable.
IMPRESSION: No active cardiopulmonary disease.

## 2018-12-05 ENCOUNTER — Telehealth: Payer: Self-pay | Admitting: Physician Assistant

## 2018-12-05 MED ORDER — CLOPIDOGREL BISULFATE 75 MG PO TABS: 75.0000 mg | ORAL_TABLET | Freq: Every day | ORAL | 2 refills | Status: DC

## 2018-12-05 NOTE — Telephone Encounter (Signed)
Rx for Plavix sent to the pharmacy per pt request.

## 2018-12-05 NOTE — Telephone Encounter (Signed)
New message   Pt c/o medication issue:  1. Name of Medication: clopidogrel (PLAVIX) 75 MG tablet  2. How are you currently taking this medication (dosage and times per day)? 1 time per   3. Are you having a reaction (difficulty breathing--STAT)? No    4. What is your medication issue? Patient needs a prescription for this medication sent to [facility] on [address]

## 2018-12-20 ENCOUNTER — Telehealth (HOSPITAL_COMMUNITY): Payer: Self-pay

## 2018-12-20 NOTE — Telephone Encounter (Signed)
Phone pt regarding interest in Virtual Cardiac Rehab. Pt would like to wait to participate in program once department opens back up and not doing virtual rehab. Pt is currently exercising. Offered dietician services to pt. Pt states he is interested in gaining weight, sent CR dietician pt's info with pt's consent. Dietician will follow up with pt later today. Will contact pt when department opens back up.    Carma Lair MS, ACSM CEP 12:36 PM  12/20/2018

## 2019-01-01 DIAGNOSIS — G4752 REM sleep behavior disorder: Secondary | ICD-10-CM | POA: Diagnosis not present

## 2019-01-04 ENCOUNTER — Telehealth (HOSPITAL_COMMUNITY): Payer: Self-pay

## 2019-01-04 NOTE — Telephone Encounter (Signed)
Pt insurance is active and benefits verified through Medicare A/B. Co-pay $0.00, DED $198.00/$198.00 met, out of pocket $0.00/$0.00 met, co-insurance 20%. No pre-authorization required. Passport, 01/04/2019 @ 11:27AM, REF# [id number]  2ndary insurance is active and benefits verified through [place]. Co-pay $0.00, DED $0.00/$0.00 met, out of pocket $0.00/$0.00 met, co-insurance 0%. No pre-authorization required. Passport, 01/04/2019 @ 11:29AM, REF# [id number]

## 2019-01-09 NOTE — Telephone Encounter (Signed)
Opened in error

## 2019-01-18 ENCOUNTER — Telehealth (HOSPITAL_COMMUNITY): Payer: Self-pay

## 2019-02-21 ENCOUNTER — Encounter (HOSPITAL_COMMUNITY): Payer: Self-pay

## 2019-02-23 NOTE — Progress Notes (Signed)
Virtual Visit via Telephone Note   This visit type was conducted due to national recommendations for restrictions regarding the COVID-19 Pandemic (e.g. social distancing) in an effort to limit this patient's exposure and mitigate transmission in our community.  Due to his co-morbid illnesses, this patient is at least at moderate risk for complications without adequate follow up.  This format is felt to be most appropriate for this patient at this time.  The patient did not have access to video technology/had technical difficulties with video requiring transitioning to audio format only (telephone).  All issues noted in this document were discussed and addressed.  No physical exam could be performed with this format.  Please refer to the patient's chart for his  consent to telehealth for [facility].   Date:  03/01/2019   ID:  Jerry Barr, DOB 12-24-1940, MRN [id number]  Patient Location: Home Provider Location: Home  PCP:  Antony Contras, MD  Cardiologist:  Callahan Peddie Martinique, MD  Electrophysiologist:  None   Evaluation Performed:  Follow-Up Visit  Chief Complaint: Follow-up  History of Present Illness:    Jerry Barr is a 78 y.o. male with PMH of HLD, chronic hypotension, PAF on Xarelto, CAD s/p DES to LAD 2011 in [place] and BPH. He was diagnosed with new afib in 04/2016. Echo in 04/2016 showed EF 50-55%, no RWMA, mild MR. He had successful TEE DCCV 04/23/2016. He was placed on midodrine for orthostatic hypotension. He is not on any rate control therapy for the same reason. Myoview  In 06/2017 showed EF 55%, no significant ischemia or scar. Echo in 06/2017 EF 45-50%, mild diffuse hypokinesis, PA peak pressure 66mmHg.  Patient was seen in the  ED in April  with recurrent afib and fatigue. Given his history of orthostatic hypotension, it was elected to proceed with cardioversion in the ED. This was performed by Dr. Haroldine Laws. Post cardioversion, his amiodarone was increased to 200mg   BID. Since discharge, he was seen in the afib clinic, it is planned to consider decreasing amiodarone back to 200mg  daily after 1 month if no recurrent afib.   He was seen on 10/30/2018 for post hospital follow-up, at which time he noted  shoulder blade pain that occur with  walking. He had a  cardiac catheterization on 11/15/2018 which revealed 20% proximal RCA, 90% mid left circumflex lesion treated with a 2.25 x 16 mm Synergy DES, EF 55 to 65%.  Postprocedure, patient was discharged on aspirin, Plavix and Xarelto.  The plan is to stop aspirin after 1 month.  He will require a minimum of 6 months of Plavix.   On follow up today he states he feels the best he has in a long time. Denies any chest pain or SOB. No indigestion. He still has some days with orthostasis.  Wears support hose and uses midodrine prn. At most he will take one a day. Symptoms more in the morning. He has been diagnosed with an REM sleep disorder managed by Dr Maxwell Caul.   The patient does not have symptoms concerning for COVID-19 infection (fever, chills, cough, or new shortness of breath).    Past Medical History:  Diagnosis Date  . Allergy   . BPH (benign prostatic hyperplasia)   . Cancer (HCC)    basal cell ca - face, nose and right leg  . Clotting disorder (HCC)    a fib hx - Xarelto  . Coronary artery disease    Promus drug-eluting stent to a 99% mid LAD, 40-50%  narrowing in the distal LAD with mild to moderate disease in the circ and RCA b. 5/6 PCI/DESx1 to mLcx  . Dyslipidemia   . ED (erectile dysfunction)   . Hyperplastic colon polyp   . Hypoglycemia    Past Surgical History:  Procedure Laterality Date  . CARDIOVERSION N/A 04/23/2016   Procedure: CARDIOVERSION;  Surgeon: [name], MD;  Location: [facility] ENDOSCOPY;  Service: Cardiovascular;  Laterality: N/A;  . COLONOSCOPY    . CORONARY ANGIOPLASTY WITH STENT PLACEMENT    . CORONARY STENT INTERVENTION N/A 11/15/2018   Procedure: CORONARY STENT INTERVENTION;   Surgeon: Burnell Blanks, MD;  Location: [place] CV LAB;  Service: Cardiovascular;  Laterality: N/A;  . LEFT HEART CATH AND CORONARY ANGIOGRAPHY N/A 11/15/2018   Procedure: LEFT HEART CATH AND CORONARY ANGIOGRAPHY;  Surgeon: Burnell Blanks, MD;  Location: [place] CV LAB;  Service: Cardiovascular;  Laterality: N/A;  . POLYPECTOMY    . TEE WITHOUT CARDIOVERSION N/A 04/23/2016   Procedure: TRANSESOPHAGEAL ECHOCARDIOGRAM (TEE);  Surgeon: [name], MD;  Location: [facility] ENDOSCOPY;  Service: Cardiovascular;  Laterality: N/A;  . UPPER GASTROINTESTINAL ENDOSCOPY       Current Meds  Medication Sig  . acetaminophen (TYLENOL) 500 MG tablet Take 500 mg by mouth every 6 (six) hours as needed for headache (pain).  Marland Kitchen amiodarone (PACERONE) 200 MG tablet Take 1 tablet (200 mg total) by mouth daily. Take 1 tablet by mouth daily. (Patient taking differently: Take 200 mg by mouth daily. )  . atorvastatin (LIPITOR) 20 MG tablet Take 1 tablet (20 mg total) by mouth daily.  . Cholecalciferol (VITAMIN D-3) 1000 UNITS CAPS Take 1,000 Units by mouth daily.  . clopidogrel (PLAVIX) 75 MG tablet Take 1 tablet (75 mg total) by mouth daily.  . fluticasone (FLONASE) 50 MCG/ACT nasal spray Place 1 spray into both nostrils daily as needed (seasonal allergies).   . Ketotifen Fumarate (ALLERGY EYE DROPS OP) Place 1 drop into both eyes daily as needed (allergies).  . loratadine (CLARITIN) 10 MG tablet Take 10 mg by mouth as needed for allergies.   . Melatonin 3 MG TABS Take 6 mg by mouth at bedtime.   . midodrine (PROAMATINE) 5 MG tablet Take 5 mg by mouth daily.   . rivaroxaban (XARELTO) 20 MG TABS tablet TAKE 1 TABLET (20 MG TOTAL) BY MOUTH DAILY WITH SUPPER. (Patient taking differently: Take 20 mg by mouth daily. )  . Wheat Dextrin (BENEFIBER PO) Take 30 mLs by mouth daily.  . [DISCONTINUED] aspirin 81 MG chewable tablet Chew 1 tablet (81 mg total) by mouth daily.     Allergies:   Penicillins    Social History   Tobacco Use  . Smoking status: Never Smoker  . Smokeless tobacco: Never Used  Substance Use Topics  . Alcohol use: Yes    Alcohol/week: 5.0 standard drinks    Types: 5 Glasses of wine per week    Comment: red wine  . Drug use: No     Family Hx: The patient's family history includes Bone cancer in his paternal grandfather; Breast cancer in his mother; CAD in his father; Cancer in his brother; Colon cancer in his brother and mother; Esophageal cancer in his brother; Heart attack in his paternal grandmother; Leukemia in his paternal uncle; Liver cancer in his brother. There is no history of Pancreatic cancer, Prostate cancer, Rectal cancer, or Stomach cancer.  ROS:   Please see the history of present illness.  All other systems reviewed and are negative.   Prior CV studies:   The following studies were reviewed today:  Echo 06/13/17: Study Conclusions  - Left ventricle: The cavity size was normal. Systolic function was   mildly reduced. The estimated ejection fraction was in the range   of 45% to 50%. Mild diffuse hypokinesis. Left ventricular   diastolic function parameters were normal. - Aortic valve: Transvalvular velocity was within the normal range.   There was no stenosis. There was trivial regurgitation. - Mitral valve: There was trivial regurgitation. - Left atrium: The atrium was moderately dilated. - Right ventricle: The cavity size was normal. Wall thickness was   normal. Systolic function was normal. - Right atrium: The atrium was severely dilated. - Atrial septum: No defect or patent foramen ovale was identified   by color flow Doppler. - Tricuspid valve: There was trivial regurgitation. - Pulmonary arteries: Systolic pressure was within the normal   range. PA peak pressure: 24 mm Hg (S).  Cath 11/15/2018  Prox RCA lesion is 20% stenosed.  Mid Cx lesion is 90% stenosed.  Previously placed Prox LAD to Mid LAD stent (unknown type) is widely  patent.  A drug-eluting stent was successfully placed using a STENT SYNERGY DES 2.25X16.  Post intervention, there is a 0% residual stenosis.  The left ventricular systolic function is normal.  LV end diastolic pressure is normal.  The left ventricular ejection fraction is 55-65% by visual estimate.  There is no mitral valve regurgitation.   1. Patent mid LAD stent without restenosis 2. Severe stenosis mid Circumflex artery.  3. Successful PTCA/DES x 1 mid Circumflex 4. Mild non-obstructive disease in the RCA 5. Normal LV systolic function  Recommendations: ASA and Plavix for one month then stop ASA. Will restart Xarelto tomorrow. Would recommend at least six month of Plavix and Xarelto but could consider stopping Plavix at 6 months if necessary. Likely same day PCI discharge.    Labs/Other Tests and Data Reviewed:    EKG:  No Ecg tracing today  Recent Labs: 10/15/2018: ALT 38; Magnesium 2.2 11/14/2018: BUN 23; Creatinine, Ser 1.04; Hemoglobin 14.1; Platelets 213; Potassium 5.1; Sodium 141   Recent Lipid Panel Lab Results  Component Value Date/Time   CHOL 143 04/19/2018 08:07 AM   TRIG 40 04/19/2018 08:07 AM   HDL 55 04/19/2018 08:07 AM   CHOLHDL 2.6 04/19/2018 08:07 AM   CHOLHDL 3.0 06/29/2016 10:11 AM   LDLCALC 80 04/19/2018 08:07 AM    Wt Readings from Last 3 Encounters:  03/01/19 148 lb (67.1 kg)  11/23/18 143 lb (64.9 kg)  11/17/18 143 lb (64.9 kg)     Objective:    Vital Signs:  Ht 6' (1.829 m)   Wt 148 lb (67.1 kg)   BMI 20.07 kg/m    VITAL SIGNS:  reviewed  General: no distress HEENT normal Respirations unlabored. Neuro: alert oriented x 3. No focal findings.  ASSESSMENT & PLAN:    1. CAD: s/p PCI of the LCx with DES in May.  He is asymptomatic.   He is on  therapy with  Plavix and Xarelto.  Off ASA. Continue Plavix for one year.   2. PAF: s/p DCCV on April 5.   No recurrence. Possible triggered by ischemia in LCx territory. Continue Xarelto and  amiodarone therapy.  Unable to tolerate any rate control therapy given history of chronic hypotension. Will follow up LFTs and TSH.  3. Chronic orthostatic hypotension: His blood pressure is stable. Continue  support hose, liberal salt intake and midodrine prn.  4. Hyperlipidemia: Continue current statin  COVID-19 Education: The signs and symptoms of COVID-19 were discussed with the patient and how to seek care for testing (follow up with PCP or arrange E-visit).  The importance of social distancing was discussed today.  Time:   Today, I have spent 15 minutes with the patient with telehealth technology discussing the above problems.     Medication Adjustments/Labs and Tests Ordered: Current medicines are reviewed at length with the patient today.  Concerns regarding medicines are outlined above.   Tests Ordered: No orders of the defined types were placed in this encounter.   Medication Changes: No orders of the defined types were placed in this encounter.   Disposition:  Follow up in 6 months  Signed, Faige Seely Martinique, MD  03/01/2019 3:45 PM    [facility]

## 2019-03-01 ENCOUNTER — Encounter: Payer: Self-pay | Admitting: Cardiology

## 2019-03-01 ENCOUNTER — Other Ambulatory Visit: Payer: Self-pay

## 2019-03-01 ENCOUNTER — Telehealth (INDEPENDENT_AMBULATORY_CARE_PROVIDER_SITE_OTHER): Payer: Medicare Other | Admitting: Cardiology

## 2019-03-01 VITALS — Ht 72.0 in | Wt 148.0 lb

## 2019-03-01 DIAGNOSIS — I951 Orthostatic hypotension: Secondary | ICD-10-CM

## 2019-03-01 DIAGNOSIS — I480 Paroxysmal atrial fibrillation: Secondary | ICD-10-CM

## 2019-03-01 DIAGNOSIS — I2510 Atherosclerotic heart disease of native coronary artery without angina pectoris: Secondary | ICD-10-CM | POA: Diagnosis not present

## 2019-03-01 DIAGNOSIS — E7800 Pure hypercholesterolemia, unspecified: Secondary | ICD-10-CM

## 2019-03-01 NOTE — Addendum Note (Signed)
Addended by: Kathyrn Lass on: 03/01/2019 03:53 PM   Modules accepted: Orders

## 2019-03-01 NOTE — Patient Instructions (Addendum)
Continue your current therapy  We will check lab work  ( cmet,lipid panel,tsh )   Follow up 6 months     ( Call in Nov to schedule Feb appointment )

## 2019-03-02 ENCOUNTER — Telehealth (HOSPITAL_COMMUNITY): Payer: Self-pay | Admitting: *Deleted

## 2019-03-02 NOTE — Telephone Encounter (Signed)
Pt called after receiving a please contact letter.  Pt indicated that he saw Dr. Martinique on yesterday and reviewed with him what he was doing for exercise.  Pt exercises 150 minutes a week, lifts weights and does push up.  Dr. Martinique felt the pt who is not new to exercise could continue to his home exercise and not participate in cardiac rehab. Cherre Huger, BSN Cardiac and Training and development officer

## 2019-03-13 DIAGNOSIS — C44319 Basal cell carcinoma of skin of other parts of face: Secondary | ICD-10-CM | POA: Diagnosis not present

## 2019-03-22 DIAGNOSIS — Z23 Encounter for immunization: Secondary | ICD-10-CM | POA: Diagnosis not present

## 2019-03-23 DIAGNOSIS — I951 Orthostatic hypotension: Secondary | ICD-10-CM | POA: Diagnosis not present

## 2019-03-23 DIAGNOSIS — E7800 Pure hypercholesterolemia, unspecified: Secondary | ICD-10-CM | POA: Diagnosis not present

## 2019-03-23 DIAGNOSIS — I2510 Atherosclerotic heart disease of native coronary artery without angina pectoris: Secondary | ICD-10-CM | POA: Diagnosis not present

## 2019-03-23 DIAGNOSIS — I480 Paroxysmal atrial fibrillation: Secondary | ICD-10-CM | POA: Diagnosis not present

## 2019-03-23 LAB — LIPID PANEL
Chol/HDL Ratio: 2.4 ratio (ref 0.0–5.0)
Cholesterol, Total: 120 mg/dL (ref 100–199)
HDL: 50 mg/dL (ref >39)
LDL Chol Calc (NIH): 59 mg/dL (ref 0–99)
Triglycerides: 42 mg/dL (ref 0–149)
VLDL Cholesterol Cal: 11 mg/dL (ref 5–40)

## 2019-03-23 LAB — COMPREHENSIVE METABOLIC PANEL
ALT: 21 IU/L (ref 0–44)
AST: 19 IU/L (ref 0–40)
Albumin/Globulin Ratio: 1.7 (ref 1.2–2.2)
Albumin: 4 g/dL (ref 3.7–4.7)
Alkaline Phosphatase: 72 IU/L (ref 39–117)
BUN/Creatinine Ratio: 18 (ref 10–24)
BUN: 20 mg/dL (ref 8–27)
Bilirubin Total: 0.5 mg/dL (ref 0.0–1.2)
CO2: 27 mmol/L (ref 20–29)
Calcium: 9.1 mg/dL (ref 8.6–10.2)
Chloride: 104 mmol/L (ref 96–106)
Creatinine, Ser: 1.11 mg/dL (ref 0.76–1.27)
GFR calc Af Amer: 74 mL/min/{1.73_m2} (ref >59)
GFR calc non Af Amer: 64 mL/min/{1.73_m2} (ref >59)
Globulin, Total: 2.3 g/dL (ref 1.5–4.5)
Glucose: 85 mg/dL (ref 65–99)
Potassium: 5.4 mmol/L — ABNORMAL HIGH (ref 3.5–5.2)
Sodium: 140 mmol/L (ref 134–144)
Total Protein: 6.3 g/dL (ref 6.0–8.5)

## 2019-03-23 LAB — TSH: TSH: 2.62 u[IU]/mL (ref 0.450–4.500)

## 2019-03-27 DIAGNOSIS — C44311 Basal cell carcinoma of skin of nose: Secondary | ICD-10-CM | POA: Diagnosis not present

## 2019-03-28 ENCOUNTER — Telehealth: Payer: Self-pay | Admitting: Cardiology

## 2019-03-28 NOTE — Telephone Encounter (Signed)
Yes  Peter Jordan MD, FACC   

## 2019-03-28 NOTE — Telephone Encounter (Signed)
Called patient, he states he has skin removed from his nose yesterday- they placed stiches but he had so much bleeding over night, he had to go back and have them replaced- Dr.Mitkov asked if he would be able to stop the medication for a few days to get the bleeding under control. Advised I would route to MD and PharmD to advise.

## 2019-03-28 NOTE — Telephone Encounter (Signed)
New message   Patient wants to know if she can stay off blood thinners for a couple of days. Patient had mohs to remove a skin cancer. Dr. Winifred Olive did the surgery. Please call to discuss.

## 2019-03-28 NOTE — Telephone Encounter (Signed)
Patient with diagnosis of  Atrial fibrillation on Xarelto for anticoagulation.    CHADS2-VASc score of  4 (HTN, AGE x 2, CAD)  **No history of stroke of TIA noted*  OKAY to hold warfarin for 2 days until bleeding resolved

## 2019-03-28 NOTE — Telephone Encounter (Signed)
Called and notified patient to hold Xarelto for 2 days, okay per MD. Patient verbalized understanding.

## 2019-03-28 NOTE — Telephone Encounter (Signed)
Patient not on warfarin, can he hold the xarelto for 2 days?  just to clarify, thank you!

## 2019-05-08 DIAGNOSIS — I480 Paroxysmal atrial fibrillation: Secondary | ICD-10-CM | POA: Diagnosis not present

## 2019-05-08 DIAGNOSIS — I4891 Unspecified atrial fibrillation: Secondary | ICD-10-CM | POA: Diagnosis not present

## 2019-05-08 DIAGNOSIS — E785 Hyperlipidemia, unspecified: Secondary | ICD-10-CM | POA: Diagnosis not present

## 2019-05-08 DIAGNOSIS — N401 Enlarged prostate with lower urinary tract symptoms: Secondary | ICD-10-CM | POA: Diagnosis not present

## 2019-05-08 DIAGNOSIS — I2510 Atherosclerotic heart disease of native coronary artery without angina pectoris: Secondary | ICD-10-CM | POA: Diagnosis not present

## 2019-05-08 DIAGNOSIS — E7800 Pure hypercholesterolemia, unspecified: Secondary | ICD-10-CM | POA: Diagnosis not present

## 2019-05-13 ENCOUNTER — Other Ambulatory Visit: Payer: Self-pay | Admitting: Adult Health

## 2019-05-16 ENCOUNTER — Ambulatory Visit (HOSPITAL_COMMUNITY)
Admission: RE | Admit: 2019-05-16 | Discharge: 2019-05-16 | Disposition: A | Discharge status: Home / Self Care | Payer: Medicare Other | Source: Ambulatory Visit | Attending: Physician Assistant | Admitting: Physician Assistant

## 2019-05-16 ENCOUNTER — Other Ambulatory Visit: Payer: Self-pay

## 2019-05-16 ENCOUNTER — Encounter (HOSPITAL_COMMUNITY): Payer: Self-pay | Admitting: Physician Assistant

## 2019-05-16 ENCOUNTER — Other Ambulatory Visit: Payer: Self-pay | Admitting: Dermatology

## 2019-05-16 VITALS — BP 126/66 | HR 69 | Ht 72.0 in | Wt 152.8 lb

## 2019-05-16 DIAGNOSIS — I480 Paroxysmal atrial fibrillation: Secondary | ICD-10-CM

## 2019-05-16 DIAGNOSIS — I9589 Other hypotension: Secondary | ICD-10-CM | POA: Insufficient documentation

## 2019-05-16 DIAGNOSIS — E785 Hyperlipidemia, unspecified: Secondary | ICD-10-CM | POA: Diagnosis not present

## 2019-05-16 DIAGNOSIS — Z7902 Long term (current) use of antithrombotics/antiplatelets: Secondary | ICD-10-CM | POA: Diagnosis not present

## 2019-05-16 DIAGNOSIS — I2510 Atherosclerotic heart disease of native coronary artery without angina pectoris: Secondary | ICD-10-CM | POA: Diagnosis not present

## 2019-05-16 DIAGNOSIS — D6869 Other thrombophilia: Secondary | ICD-10-CM

## 2019-05-16 DIAGNOSIS — Z79899 Other long term (current) drug therapy: Secondary | ICD-10-CM | POA: Insufficient documentation

## 2019-05-16 DIAGNOSIS — D0439 Carcinoma in situ of skin of other parts of face: Secondary | ICD-10-CM | POA: Diagnosis not present

## 2019-05-16 NOTE — Progress Notes (Addendum)
Primary Care Physician: Antony Contras, MD Primary Cardiologist: Dr Martinique Primary Electrophysiologist: none Referring Physician: [facility]   Jerry Barr is a 78 y.o. male with a history of paroxysmal atrial fibrillation, CAD, BPH, HLD, and chronic hypotension who presents for follow up in the [facility]. Patient reports that he has done very well since his last visit. He has not had any heart racing or palpitations. He remains active for his age. He is tolerating the medication without difficulty. He is on Xarelto for a CHADS2VASC score of 3.  Today, he denies symptoms of palpitations, chest pain, shortness of breath, orthopnea, PND, lower extremity edema, dizziness, presyncope, syncope, snoring, daytime somnolence, bleeding, or neurologic sequela. The patient is tolerating medications without difficulties and is otherwise without complaint today.    Atrial Fibrillation Risk Factors:  he does not have symptoms or diagnosis of sleep apnea. he does not have a history of alcohol use. The patient does not have a history of early familial atrial fibrillation or other arrhythmias.  he has a BMI of Body mass index is 20.72 kg/m.Marland Kitchen Filed Weights   05/16/19 1323  Weight: 69.3 kg    Family History  Problem Relation Age of Onset  . Breast cancer Mother   . Colon cancer Mother   . CAD Father   . Heart attack Paternal Grandmother   . Colon cancer Brother   . Esophageal cancer Brother   . Liver cancer Brother   . Cancer Brother   . Leukemia Paternal Uncle   . Bone cancer Paternal Grandfather   . Pancreatic cancer Neg Hx   . Prostate cancer Neg Hx   . Rectal cancer Neg Hx   . Stomach cancer Neg Hx      Atrial Fibrillation Management history:  Previous antiarrhythmic drugs: amiodarone  Previous cardioversions: 2017, 10/15/18 Previous ablations: none CHADS2VASC score: 3 Anticoagulation history: Xarelto    Past Medical History:  Diagnosis Date   . Allergy   . BPH (benign prostatic hyperplasia)   . Cancer (HCC)    basal cell ca - face, nose and right leg  . Clotting disorder (HCC)    a fib hx - Xarelto  . Coronary artery disease    Promus drug-eluting stent to a 99% mid LAD, 40-50% narrowing in the distal LAD with mild to moderate disease in the circ and RCA b. 5/6 PCI/DESx1 to mLcx  . Dyslipidemia   . ED (erectile dysfunction)   . Hyperplastic colon polyp   . Hypoglycemia    Past Surgical History:  Procedure Laterality Date  . CARDIOVERSION N/A 04/23/2016   Procedure: CARDIOVERSION;  Surgeon: [name], MD;  Location: [facility] ENDOSCOPY;  Service: Cardiovascular;  Laterality: N/A;  . COLONOSCOPY    . CORONARY ANGIOPLASTY WITH STENT PLACEMENT    . CORONARY STENT INTERVENTION N/A 11/15/2018   Procedure: CORONARY STENT INTERVENTION;  Surgeon: Burnell Blanks, MD;  Location: [place] CV LAB;  Service: Cardiovascular;  Laterality: N/A;  . LEFT HEART CATH AND CORONARY ANGIOGRAPHY N/A 11/15/2018   Procedure: LEFT HEART CATH AND CORONARY ANGIOGRAPHY;  Surgeon: Burnell Blanks, MD;  Location: [place] CV LAB;  Service: Cardiovascular;  Laterality: N/A;  . POLYPECTOMY    . TEE WITHOUT CARDIOVERSION N/A 04/23/2016   Procedure: TRANSESOPHAGEAL ECHOCARDIOGRAM (TEE);  Surgeon: [name], MD;  Location: [facility] ENDOSCOPY;  Service: Cardiovascular;  Laterality: N/A;  . UPPER GASTROINTESTINAL ENDOSCOPY      Current Outpatient Medications  Medication  Sig Dispense Refill  . acetaminophen (TYLENOL) 500 MG tablet Take 500 mg by mouth every 6 (six) hours as needed for headache (pain).    Marland Kitchen amiodarone (PACERONE) 200 MG tablet Take 1 tablet (200 mg total) by mouth daily. Take 1 tablet by mouth daily. (Patient taking differently: Take 200 mg by mouth daily. ) 90 tablet 1  . atorvastatin (LIPITOR) 20 MG tablet Take 1 tablet (20 mg total) by mouth daily. 90 tablet 1  . Cholecalciferol (VITAMIN D-3) 1000 UNITS CAPS Take 1,000 Units by  mouth daily.    . clopidogrel (PLAVIX) 75 MG tablet Take 1 tablet (75 mg total) by mouth daily. 90 tablet 2  . fluticasone (FLONASE) 50 MCG/ACT nasal spray Place 1 spray into both nostrils daily as needed (seasonal allergies).     . Ketotifen Fumarate (ALLERGY EYE DROPS OP) Place 1 drop into both eyes daily as needed (allergies).    . loratadine (CLARITIN) 10 MG tablet Take 10 mg by mouth as needed for allergies.     . Melatonin 3 MG TABS Take 6 mg by mouth at bedtime.     . midodrine (PROAMATINE) 5 MG tablet Take 5 mg by mouth daily.     . Wheat Dextrin (BENEFIBER PO) Take 30 mLs by mouth daily.    Alveda Reasons 20 MG TABS tablet TAKE 1 TABLET (20 MG TOTAL) BY MOUTH DAILY WITH SUPPER. 90 tablet 1   No current facility-administered medications for this encounter.     Allergies  Allergen Reactions  . Penicillins Rash    Did it involve swelling of the face/tongue/throat, SOB, or low BP? No Did it involve sudden or severe rash/hives, skin peeling, or any reaction on the inside of your mouth or nose? No Did you need to seek medical attention at a hospital or doctor's office? No When did it last happen?50+ years  If all above answers are "NO", may proceed with cephalosporin use.     Social History   Socioeconomic History  . Marital status: Married    Spouse name: Not on file  . Number of children: Not on file  . Years of education: Not on file  . Highest education level: Not on file  Occupational History  . Not on file  Social Needs  . Financial resource strain: Not on file  . Food insecurity    Worry: Not on file    Inability: Not on file  . Transportation needs    Medical: Not on file    Non-medical: Not on file  Tobacco Use  . Smoking status: Never Smoker  . Smokeless tobacco: Never Used  Substance and Sexual Activity  . Alcohol use: Yes    Alcohol/week: 3.0 standard drinks    Types: 3 Glasses of wine per week    Comment: red wine weekly  . Drug use: No  . Sexual  activity: Not on file  Lifestyle  . Physical activity    Days per week: Not on file    Minutes per session: Not on file  . Stress: Not on file  Relationships  . Social Herbalist on phone: Not on file    Gets together: Not on file    Attends religious service: Not on file    Active member of club or organization: Not on file    Attends meetings of clubs or organizations: Not on file    Relationship status: Not on file  . Intimate partner violence    Fear  of current or ex partner: Not on file    Emotionally abused: Not on file    Physically abused: Not on file    Forced sexual activity: Not on file  Other Topics Concern  . Not on file  Social History Narrative  . Not on file     ROS- All systems are reviewed and negative except as per the HPI above.  Physical Exam: Vitals:   05/16/19 1323  BP: 126/66  Pulse: 69  Weight: 69.3 kg  Height: 6' (1.829 m)    GEN- The patient is well appearing elderly male, alert and oriented x 3 today.   Head- normocephalic, atraumatic Eyes-  Sclera clear, conjunctiva pink Ears- hearing intact Oropharynx- clear Neck- supple  Lungs- Clear to ausculation bilaterally, normal work of breathing Heart- Regular rate and rhythm, no murmurs, rubs or gallops  GI- soft, NT, ND, + BS Extremities- no clubbing, cyanosis, or edema MS- no significant deformity or atrophy Skin- no rash or lesion Psych- euthymic mood, full affect Neuro- strength and sensation are intact  Wt Readings from Last 3 Encounters:  05/16/19 69.3 kg  03/01/19 67.1 kg  11/23/18 64.9 kg    EKG today demonstrates SR HR 69, PR 192, QRS 100, QTc 452  Echo 06/13/17 demonstrated  - Left ventricle: The cavity size was normal. Systolic function was   mildly reduced. The estimated ejection fraction was in the range   of 45% to 50%. Mild diffuse hypokinesis. Left ventricular   diastolic function parameters were normal. - Aortic valve: Transvalvular velocity was within  the normal range.   There was no stenosis. There was trivial regurgitation. - Mitral valve: There was trivial regurgitation. - Left atrium: The atrium was moderately dilated. - Right ventricle: The cavity size was normal. Wall thickness was   normal. Systolic function was normal. - Right atrium: The atrium was severely dilated. - Atrial septum: No defect or patent foramen ovale was identified   by color flow Doppler. - Tricuspid valve: There was trivial regurgitation. - Pulmonary arteries: Systolic pressure was within the normal   range. PA peak pressure: 24 mm Hg (S).  Epic records are reviewed at length today  Assessment and Plan:  1. Paroxysmal atrial fibrillation Patient appears to be maintaining SR. Continue amiodarone 200 mg daily. Recent labs reviewed. Continue Xarelto 20 mg daily  This patients CHA2DS2-VASc Score and unadjusted Ischemic Stroke Rate (% per year) is equal to 3.2 % stroke rate/year from a score of 3  Above score calculated as 1 point each if present [CHF, HTN, DM, Vascular=MI/PAD/Aortic Plaque, Age if 65-74, or Male] Above score calculated as 2 points each if present [Age > 75, or Stroke/TIA/TE]   2. CAD S/p DES to LAD 2011 and mid cirfumflex 11/2018. No anginal symptoms. Followed by Dr Martinique.    Follow up with Dr Martinique per recall. AF clinic in 6 months.    [facility] [address] [place], [place] [address] [phone] 05/16/2019 3:44 PM

## 2019-06-05 ENCOUNTER — Other Ambulatory Visit: Payer: Self-pay | Admitting: Adult Health

## 2019-06-05 NOTE — Telephone Encounter (Signed)
Rx request sent to pharmacy.  

## 2019-06-18 ENCOUNTER — Telehealth: Payer: Self-pay | Admitting: Physician Assistant

## 2019-06-18 NOTE — Telephone Encounter (Signed)
Before his anginal pain was in his shoulder blade. Does he think this is the same type of pain or different? Since he had recurrent Afib on low does amiodarone before I would probably stick with 200 mg daily for now   Peter Martinique MD, [facility]

## 2019-06-18 NOTE — Telephone Encounter (Signed)
Spoke to pt. States his neck and shoulder start hurting when he goes on his 30-72min walks. Denies chest pain. States it goes away when he rests. He states he gets this pain when he first wakes up sometimes and makes breakfast but subsides with rest. Advised pt that if he has this pain and it does not subside to call 911.  Also wants to know if his amiodarone is ever going to decreased back down to 100mg .   Will route to Dr Martinique for review.

## 2019-06-18 NOTE — Telephone Encounter (Signed)
New message    Patient calling to report neck, shoulder pain for 1 month. Also wants to discuss changing afib medication.

## 2019-06-19 NOTE — H&P (View-Only) (Signed)
Date:  06/20/2019   ID:  Jerry Barr, DOB Oct 10, 1940, MRN [id number]   PCP:  Antony Contras, MD  Cardiologist:  Tiffany Talarico Martinique, MD  Electrophysiologist:  None   Evaluation Performed:  Follow-Up Visit  Chief Complaint: shoulder pain.   History of Present Illness:    Jerry Barr is a 78 y.o. male seen for evaluation of recurrent angina. He has a  PMH of HLD, chronic hypotension, PAF on Xarelto, CAD s/p DES to LAD 2011 in [place] and BPH. He was diagnosed with new afib in 04/2016. Echo in 04/2016 showed EF 50-55%, no RWMA, mild MR. He had successful TEE DCCV 04/23/2016. He was placed on midodrine for orthostatic hypotension. He is not on any rate control therapy for the same reason. Myoview  In 06/2017 showed EF 55%, no significant ischemia or scar. Echo in 06/2017 EF 45-50%, mild diffuse hypokinesis, PA peak pressure 27mmHg.  Patient was seen in the  ED in April  with recurrent afib and fatigue. Given his history of orthostatic hypotension, it was elected to proceed with cardioversion in the ED. This was performed by Dr. Haroldine Laws. Post cardioversion, his amiodarone was increased to 200mg  BID. After discharge, he was seen in the afib clinic with plans to consider decreasing amiodarone back to 200mg  daily after 1 month if no recurrent afib.   He was seen on 10/30/2018 for post hospital follow-up, at which time he noted  shoulder blade pain that occur with  walking. He had a  cardiac catheterization on 11/15/2018 which revealed 20% proximal RCA, 90% mid left circumflex lesion treated with a 2.25 x 16 mm Synergy DES, EF 55 to 65%.  Postprocedure, patient was discharged on aspirin, Plavix and Xarelto.  The plan is to stop aspirin after 1 month.  He will require a minimum of 6 months of Plavix.   When seen in August he felt great.  He still had some days with orthostasis.  Wears support hose and uses midodrine in am.  He has been diagnosed with an REM sleep disorder managed by Dr Maxwell Caul. He was  seen in the Afib clinic on 05/16/19 and was maintaining NSR on 200 mg of amiodarone.   Since that visit he reports recurrent exertional neck and pain in his shoulder blade that resolves with rest. This may occur in the am with initial activity and also with his daily walk. Sometimes if he continues to walk it goes away after 15 minutes. He also notes increased heartburn in the past 2 weeks. These symptoms are similar to what he had in May and in 2011 with his initial stent.    Past Medical History:  Diagnosis Date  . Allergy   . BPH (benign prostatic hyperplasia)   . Cancer (HCC)    basal cell ca - face, nose and right leg  . Clotting disorder (HCC)    a fib hx - Xarelto  . Coronary artery disease    Promus drug-eluting stent to a 99% mid LAD, 40-50% narrowing in the distal LAD with mild to moderate disease in the circ and RCA b. 5/6 PCI/DESx1 to mLcx  . Dyslipidemia   . ED (erectile dysfunction)   . Hyperplastic colon polyp   . Hypoglycemia    Past Surgical History:  Procedure Laterality Date  . CARDIOVERSION N/A 04/23/2016   Procedure: CARDIOVERSION;  Surgeon: [name], MD;  Location: [facility] ENDOSCOPY;  Service: Cardiovascular;  Laterality: N/A;  . COLONOSCOPY    . CORONARY ANGIOPLASTY WITH  STENT PLACEMENT    . CORONARY STENT INTERVENTION N/A 11/15/2018   Procedure: CORONARY STENT INTERVENTION;  Surgeon: Burnell Blanks, MD;  Location: [place] CV LAB;  Service: Cardiovascular;  Laterality: N/A;  . LEFT HEART CATH AND CORONARY ANGIOGRAPHY N/A 11/15/2018   Procedure: LEFT HEART CATH AND CORONARY ANGIOGRAPHY;  Surgeon: Burnell Blanks, MD;  Location: [place] CV LAB;  Service: Cardiovascular;  Laterality: N/A;  . POLYPECTOMY    . TEE WITHOUT CARDIOVERSION N/A 04/23/2016   Procedure: TRANSESOPHAGEAL ECHOCARDIOGRAM (TEE);  Surgeon: [name], MD;  Location: [facility] ENDOSCOPY;  Service: Cardiovascular;  Laterality: N/A;  . UPPER GASTROINTESTINAL ENDOSCOPY        Current Meds  Medication Sig  . acetaminophen (TYLENOL) 500 MG tablet Take 500 mg by mouth every 6 (six) hours as needed for headache (pain).  Marland Kitchen amiodarone (PACERONE) 200 MG tablet Take 1 tablet (200 mg total) by mouth daily.  Marland Kitchen atorvastatin (LIPITOR) 20 MG tablet Take 1 tablet (20 mg total) by mouth daily.  . Cholecalciferol (VITAMIN D-3) 1000 UNITS CAPS Take 1,000 Units by mouth daily.  . clopidogrel (PLAVIX) 75 MG tablet Take 1 tablet (75 mg total) by mouth daily.  . famotidine (PEPCID) 20 MG tablet Take 1 tablet (20 mg total) by mouth 2 (two) times daily as needed for heartburn or indigestion.  . fluticasone (FLONASE) 50 MCG/ACT nasal spray Place 1 spray into both nostrils daily as needed (seasonal allergies).   . Ketotifen Fumarate (ALLERGY EYE DROPS OP) Place 1 drop into both eyes daily as needed (allergies).  . loratadine (CLARITIN) 10 MG tablet Take 10 mg by mouth as needed for allergies.   . Melatonin 3 MG TABS Take 6 mg by mouth at bedtime.   . midodrine (PROAMATINE) 5 MG tablet Take 5 mg by mouth daily.   . Wheat Dextrin (BENEFIBER PO) Take 30 mLs by mouth daily.  Alveda Reasons 20 MG TABS tablet TAKE 1 TABLET (20 MG TOTAL) BY MOUTH DAILY WITH SUPPER.     Allergies:   Penicillins   Social History   Tobacco Use  . Smoking status: Never Smoker  . Smokeless tobacco: Never Used  Substance Use Topics  . Alcohol use: Yes    Alcohol/week: 3.0 standard drinks    Types: 3 Glasses of wine per week    Comment: red wine weekly  . Drug use: No     Family Hx: The patient's family history includes Bone cancer in his paternal grandfather; Breast cancer in his mother; CAD in his father; Cancer in his brother; Colon cancer in his brother and mother; Esophageal cancer in his brother; Heart attack in his paternal grandmother; Leukemia in his paternal uncle; Liver cancer in his brother. There is no history of Pancreatic cancer, Prostate cancer, Rectal cancer, or Stomach cancer.  ROS:    Please see the history of present illness.     All other systems reviewed and are negative.   Prior CV studies:   The following studies were reviewed today:  Echo 06/13/17: Study Conclusions  - Left ventricle: The cavity size was normal. Systolic function was   mildly reduced. The estimated ejection fraction was in the range   of 45% to 50%. Mild diffuse hypokinesis. Left ventricular   diastolic function parameters were normal. - Aortic valve: Transvalvular velocity was within the normal range.   There was no stenosis. There was trivial regurgitation. - Mitral valve: There was trivial regurgitation. - Left atrium: The atrium was moderately dilated. -  Right ventricle: The cavity size was normal. Wall thickness was   normal. Systolic function was normal. - Right atrium: The atrium was severely dilated. - Atrial septum: No defect or patent foramen ovale was identified   by color flow Doppler. - Tricuspid valve: There was trivial regurgitation. - Pulmonary arteries: Systolic pressure was within the normal   range. PA peak pressure: 24 mm Hg (S).  Cath 11/15/2018  Prox RCA lesion is 20% stenosed.  Mid Cx lesion is 90% stenosed.  Previously placed Prox LAD to Mid LAD stent (unknown type) is widely patent.  A drug-eluting stent was successfully placed using a STENT SYNERGY DES 2.25X16.  Post intervention, there is a 0% residual stenosis.  The left ventricular systolic function is normal.  LV end diastolic pressure is normal.  The left ventricular ejection fraction is 55-65% by visual estimate.  There is no mitral valve regurgitation.   1. Patent mid LAD stent without restenosis 2. Severe stenosis mid Circumflex artery.  3. Successful PTCA/DES x 1 mid Circumflex 4. Mild non-obstructive disease in the RCA 5. Normal LV systolic function  Recommendations: ASA and Plavix for one month then stop ASA. Will restart Xarelto tomorrow. Would recommend at least six month of Plavix  and Xarelto but could consider stopping Plavix at 6 months if necessary. Likely same day PCI discharge.    Labs/Other Tests and Data Reviewed:    EKG:  NSR rate 61. Normal Ecg. I have personally reviewed and interpreted this study.   Recent Labs: 10/15/2018: Magnesium 2.2 11/14/2018: Hemoglobin 14.1; Platelets 213 03/23/2019: ALT 21; BUN 20; Creatinine, Ser 1.11; Potassium 5.4; Sodium 140; TSH 2.620   Recent Lipid Panel Lab Results  Component Value Date/Time   CHOL 120 03/23/2019 08:38 AM   TRIG 42 03/23/2019 08:38 AM   HDL 50 03/23/2019 08:38 AM   CHOLHDL 2.4 03/23/2019 08:38 AM   CHOLHDL 3.0 06/29/2016 10:11 AM   LDLCALC 59 03/23/2019 08:38 AM    Wt Readings from Last 3 Encounters:  06/20/19 151 lb 12.8 oz (68.9 kg)  05/16/19 152 lb 12.8 oz (69.3 kg)  03/01/19 148 lb (67.1 kg)     Objective:    Vital Signs:  BP 112/68   Pulse 65   Temp (!) 97 F (36.1 C)   Ht 6' (1.829 m)   Wt 151 lb 12.8 oz (68.9 kg)   SpO2 99%   BMI 20.59 kg/m    GENERAL:  Well appearing HEENT:  PERRL, EOMI, sclera are clear. Oropharynx is clear. NECK:  No jugular venous distention, carotid upstroke brisk and symmetric, no bruits, no thyromegaly or adenopathy LUNGS:  Clear to auscultation bilaterally CHEST:  Unremarkable HEART:  RRR,  PMI not displaced or sustained,S1 and S2 within normal limits, no S3, no S4: no clicks, no rubs, no murmurs ABD:  Soft, nontender. BS +, no masses or bruits. No hepatomegaly, no splenomegaly EXT:  2 + pulses throughout, no edema, no cyanosis no clubbing SKIN:  Warm and dry.  No rashes NEURO:  Alert and oriented x 3. Cranial nerves II through XII intact. PSYCH:  Cognitively intact     ASSESSMENT & PLAN:    1. CAD: s/p PCI of the LCx with DES in May.  Prior stent of the mid LAD in 2011. He has recurrent symptoms similar to prior angina.  He is on  therapy with  Plavix and Xarelto.  Antianginal therapy limited by orthostatic hypotension. Recommend repeat  angiography. Will arrange blood work and Covid testing.  Hold Xarelto 2 days prior to procedure. The procedure and risks were reviewed including but not limited to death, myocardial infarction, stroke, arrythmias, bleeding, transfusion, emergency surgery, dye allergy, or renal dysfunction. The patient voices understanding and is agreeable to proceed.   2. PAF: s/p DCCV on April 5.   No recurrence.  Continue Xarelto and amiodarone therapy.  Unable to tolerate any rate control therapy given history of chronic hypotension.   3. Chronic orthostatic hypotension: His blood pressure is stable. He reports some orthostasis but really hasn't had any dizziness since he is on midodrine. Continue support hose, liberal salt intake and midodrine daily.  4. Hyperlipidemia: Continue current statin. Excellent control.   Disposition:  TBD  Signed, Yoana Staib Martinique, MD  06/20/2019 1:42 PM    [facility]

## 2019-06-19 NOTE — Telephone Encounter (Signed)
Spoke to patient 06/18/19.He stated his pain his similar to pain he had before coronary stents.He is having neck pain and pain in mid back.He also is having frequent heartburn Mylanta is not helping.Advised to try Pepcid OTC as needed.Appointment scheduled with Dr.Jordan 06/20/19 at 1:20 pm.

## 2019-06-19 NOTE — Progress Notes (Signed)
Date:  06/20/2019   ID:  Jerry Barr, DOB 06-03-1941, MRN [id number]   PCP:  Antony Contras, MD  Cardiologist:  Angala Hilgers Martinique, MD  Electrophysiologist:  None   Evaluation Performed:  Follow-Up Visit  Chief Complaint: shoulder pain.   History of Present Illness:    Jerry Barr is a 78 y.o. male seen for evaluation of recurrent angina. He has a  PMH of HLD, chronic hypotension, PAF on Xarelto, CAD s/p DES to LAD 2011 in [place] and BPH. He was diagnosed with new afib in 04/2016. Echo in 04/2016 showed EF 50-55%, no RWMA, mild MR. He had successful TEE DCCV 04/23/2016. He was placed on midodrine for orthostatic hypotension. He is not on any rate control therapy for the same reason. Myoview  In 06/2017 showed EF 55%, no significant ischemia or scar. Echo in 06/2017 EF 45-50%, mild diffuse hypokinesis, PA peak pressure 61mmHg.  Patient was seen in the  ED in April  with recurrent afib and fatigue. Given his history of orthostatic hypotension, it was elected to proceed with cardioversion in the ED. This was performed by Dr. Haroldine Laws. Post cardioversion, his amiodarone was increased to 200mg  BID. After discharge, he was seen in the afib clinic with plans to consider decreasing amiodarone back to 200mg  daily after 1 month if no recurrent afib.   He was seen on 10/30/2018 for post hospital follow-up, at which time he noted  shoulder blade pain that occur with  walking. He had a  cardiac catheterization on 11/15/2018 which revealed 20% proximal RCA, 90% mid left circumflex lesion treated with a 2.25 x 16 mm Synergy DES, EF 55 to 65%.  Postprocedure, patient was discharged on aspirin, Plavix and Xarelto.  The plan is to stop aspirin after 1 month.  He will require a minimum of 6 months of Plavix.   When seen in August he felt great.  He still had some days with orthostasis.  Wears support hose and uses midodrine in am.  He has been diagnosed with an REM sleep disorder managed by Dr Maxwell Caul. He was  seen in the Afib clinic on 05/16/19 and was maintaining NSR on 200 mg of amiodarone.   Since that visit he reports recurrent exertional neck and pain in his shoulder blade that resolves with rest. This may occur in the am with initial activity and also with his daily walk. Sometimes if he continues to walk it goes away after 15 minutes. He also notes increased heartburn in the past 2 weeks. These symptoms are similar to what he had in May and in 2011 with his initial stent.    Past Medical History:  Diagnosis Date  . Allergy   . BPH (benign prostatic hyperplasia)   . Cancer (HCC)    basal cell ca - face, nose and right leg  . Clotting disorder (HCC)    a fib hx - Xarelto  . Coronary artery disease    Promus drug-eluting stent to a 99% mid LAD, 40-50% narrowing in the distal LAD with mild to moderate disease in the circ and RCA b. 5/6 PCI/DESx1 to mLcx  . Dyslipidemia   . ED (erectile dysfunction)   . Hyperplastic colon polyp   . Hypoglycemia    Past Surgical History:  Procedure Laterality Date  . CARDIOVERSION N/A 04/23/2016   Procedure: CARDIOVERSION;  Surgeon: [name], MD;  Location: [facility] ENDOSCOPY;  Service: Cardiovascular;  Laterality: N/A;  . COLONOSCOPY    . CORONARY ANGIOPLASTY WITH  STENT PLACEMENT    . CORONARY STENT INTERVENTION N/A 11/15/2018   Procedure: CORONARY STENT INTERVENTION;  Surgeon: Burnell Blanks, MD;  Location: [place] CV LAB;  Service: Cardiovascular;  Laterality: N/A;  . LEFT HEART CATH AND CORONARY ANGIOGRAPHY N/A 11/15/2018   Procedure: LEFT HEART CATH AND CORONARY ANGIOGRAPHY;  Surgeon: Burnell Blanks, MD;  Location: [place] CV LAB;  Service: Cardiovascular;  Laterality: N/A;  . POLYPECTOMY    . TEE WITHOUT CARDIOVERSION N/A 04/23/2016   Procedure: TRANSESOPHAGEAL ECHOCARDIOGRAM (TEE);  Surgeon: [name], MD;  Location: [facility] ENDOSCOPY;  Service: Cardiovascular;  Laterality: N/A;  . UPPER GASTROINTESTINAL ENDOSCOPY        Current Meds  Medication Sig  . acetaminophen (TYLENOL) 500 MG tablet Take 500 mg by mouth every 6 (six) hours as needed for headache (pain).  Marland Kitchen amiodarone (PACERONE) 200 MG tablet Take 1 tablet (200 mg total) by mouth daily.  Marland Kitchen atorvastatin (LIPITOR) 20 MG tablet Take 1 tablet (20 mg total) by mouth daily.  . Cholecalciferol (VITAMIN D-3) 1000 UNITS CAPS Take 1,000 Units by mouth daily.  . clopidogrel (PLAVIX) 75 MG tablet Take 1 tablet (75 mg total) by mouth daily.  . famotidine (PEPCID) 20 MG tablet Take 1 tablet (20 mg total) by mouth 2 (two) times daily as needed for heartburn or indigestion.  . fluticasone (FLONASE) 50 MCG/ACT nasal spray Place 1 spray into both nostrils daily as needed (seasonal allergies).   . Ketotifen Fumarate (ALLERGY EYE DROPS OP) Place 1 drop into both eyes daily as needed (allergies).  . loratadine (CLARITIN) 10 MG tablet Take 10 mg by mouth as needed for allergies.   . Melatonin 3 MG TABS Take 6 mg by mouth at bedtime.   . midodrine (PROAMATINE) 5 MG tablet Take 5 mg by mouth daily.   . Wheat Dextrin (BENEFIBER PO) Take 30 mLs by mouth daily.  Alveda Reasons 20 MG TABS tablet TAKE 1 TABLET (20 MG TOTAL) BY MOUTH DAILY WITH SUPPER.     Allergies:   Penicillins   Social History   Tobacco Use  . Smoking status: Never Smoker  . Smokeless tobacco: Never Used  Substance Use Topics  . Alcohol use: Yes    Alcohol/week: 3.0 standard drinks    Types: 3 Glasses of wine per week    Comment: red wine weekly  . Drug use: No     Family Hx: The patient's family history includes Bone cancer in his paternal grandfather; Breast cancer in his mother; CAD in his father; Cancer in his brother; Colon cancer in his brother and mother; Esophageal cancer in his brother; Heart attack in his paternal grandmother; Leukemia in his paternal uncle; Liver cancer in his brother. There is no history of Pancreatic cancer, Prostate cancer, Rectal cancer, or Stomach cancer.  ROS:   Please  see the history of present illness.     All other systems reviewed and are negative.   Prior CV studies:   The following studies were reviewed today:  Echo 06/13/17: Study Conclusions  - Left ventricle: The cavity size was normal. Systolic function was   mildly reduced. The estimated ejection fraction was in the range   of 45% to 50%. Mild diffuse hypokinesis. Left ventricular   diastolic function parameters were normal. - Aortic valve: Transvalvular velocity was within the normal range.   There was no stenosis. There was trivial regurgitation. - Mitral valve: There was trivial regurgitation. - Left atrium: The atrium was moderately dilated. -  Right ventricle: The cavity size was normal. Wall thickness was   normal. Systolic function was normal. - Right atrium: The atrium was severely dilated. - Atrial septum: No defect or patent foramen ovale was identified   by color flow Doppler. - Tricuspid valve: There was trivial regurgitation. - Pulmonary arteries: Systolic pressure was within the normal   range. PA peak pressure: 24 mm Hg (S).  Cath 11/15/2018  Prox RCA lesion is 20% stenosed.  Mid Cx lesion is 90% stenosed.  Previously placed Prox LAD to Mid LAD stent (unknown type) is widely patent.  A drug-eluting stent was successfully placed using a STENT SYNERGY DES 2.25X16.  Post intervention, there is a 0% residual stenosis.  The left ventricular systolic function is normal.  LV end diastolic pressure is normal.  The left ventricular ejection fraction is 55-65% by visual estimate.  There is no mitral valve regurgitation.   1. Patent mid LAD stent without restenosis 2. Severe stenosis mid Circumflex artery.  3. Successful PTCA/DES x 1 mid Circumflex 4. Mild non-obstructive disease in the RCA 5. Normal LV systolic function  Recommendations: ASA and Plavix for one month then stop ASA. Will restart Xarelto tomorrow. Would recommend at least six month of Plavix and  Xarelto but could consider stopping Plavix at 6 months if necessary. Likely same day PCI discharge.    Labs/Other Tests and Data Reviewed:    EKG:  NSR rate 61. Normal Ecg. I have personally reviewed and interpreted this study.   Recent Labs: 10/15/2018: Magnesium 2.2 11/14/2018: Hemoglobin 14.1; Platelets 213 03/23/2019: ALT 21; BUN 20; Creatinine, Ser 1.11; Potassium 5.4; Sodium 140; TSH 2.620   Recent Lipid Panel Lab Results  Component Value Date/Time   CHOL 120 03/23/2019 08:38 AM   TRIG 42 03/23/2019 08:38 AM   HDL 50 03/23/2019 08:38 AM   CHOLHDL 2.4 03/23/2019 08:38 AM   CHOLHDL 3.0 06/29/2016 10:11 AM   LDLCALC 59 03/23/2019 08:38 AM    Wt Readings from Last 3 Encounters:  06/20/19 151 lb 12.8 oz (68.9 kg)  05/16/19 152 lb 12.8 oz (69.3 kg)  03/01/19 148 lb (67.1 kg)     Objective:    Vital Signs:  BP 112/68   Pulse 65   Temp (!) 97 F (36.1 C)   Ht 6' (1.829 m)   Wt 151 lb 12.8 oz (68.9 kg)   SpO2 99%   BMI 20.59 kg/m    GENERAL:  Well appearing HEENT:  PERRL, EOMI, sclera are clear. Oropharynx is clear. NECK:  No jugular venous distention, carotid upstroke brisk and symmetric, no bruits, no thyromegaly or adenopathy LUNGS:  Clear to auscultation bilaterally CHEST:  Unremarkable HEART:  RRR,  PMI not displaced or sustained,S1 and S2 within normal limits, no S3, no S4: no clicks, no rubs, no murmurs ABD:  Soft, nontender. BS +, no masses or bruits. No hepatomegaly, no splenomegaly EXT:  2 + pulses throughout, no edema, no cyanosis no clubbing SKIN:  Warm and dry.  No rashes NEURO:  Alert and oriented x 3. Cranial nerves II through XII intact. PSYCH:  Cognitively intact     ASSESSMENT & PLAN:    1. CAD: s/p PCI of the LCx with DES in May.  Prior stent of the mid LAD in 2011. He has recurrent symptoms similar to prior angina.  He is on  therapy with  Plavix and Xarelto.  Antianginal therapy limited by orthostatic hypotension. Recommend repeat angiography.  Will arrange blood work and Covid testing.  Hold Xarelto 2 days prior to procedure. The procedure and risks were reviewed including but not limited to death, myocardial infarction, stroke, arrythmias, bleeding, transfusion, emergency surgery, dye allergy, or renal dysfunction. The patient voices understanding and is agreeable to proceed.   2. PAF: s/p DCCV on April 5.   No recurrence.  Continue Xarelto and amiodarone therapy.  Unable to tolerate any rate control therapy given history of chronic hypotension.   3. Chronic orthostatic hypotension: His blood pressure is stable. He reports some orthostasis but really hasn't had any dizziness since he is on midodrine. Continue support hose, liberal salt intake and midodrine daily.  4. Hyperlipidemia: Continue current statin. Excellent control.   Disposition:  TBD  Signed, Mercades Bajaj Martinique, MD  06/20/2019 1:42 PM    [facility]

## 2019-06-20 ENCOUNTER — Other Ambulatory Visit: Payer: Self-pay

## 2019-06-20 ENCOUNTER — Ambulatory Visit (INDEPENDENT_AMBULATORY_CARE_PROVIDER_SITE_OTHER): Payer: Medicare Other | Admitting: Cardiology

## 2019-06-20 ENCOUNTER — Encounter: Payer: Self-pay | Admitting: Cardiology

## 2019-06-20 ENCOUNTER — Other Ambulatory Visit: Payer: Self-pay | Admitting: Cardiology

## 2019-06-20 VITALS — BP 112/68 | HR 65 | Temp 97.0°F | Ht 72.0 in | Wt 151.8 lb

## 2019-06-20 DIAGNOSIS — I480 Paroxysmal atrial fibrillation: Secondary | ICD-10-CM | POA: Diagnosis not present

## 2019-06-20 DIAGNOSIS — I200 Unstable angina: Secondary | ICD-10-CM

## 2019-06-20 DIAGNOSIS — I25110 Atherosclerotic heart disease of native coronary artery with unstable angina pectoris: Secondary | ICD-10-CM

## 2019-06-20 DIAGNOSIS — I951 Orthostatic hypotension: Secondary | ICD-10-CM

## 2019-06-20 DIAGNOSIS — E7800 Pure hypercholesterolemia, unspecified: Secondary | ICD-10-CM | POA: Diagnosis not present

## 2019-06-20 MED ORDER — SODIUM CHLORIDE 0.9% FLUSH: 3.0000 mL | Freq: Two times a day (BID) | INTRAVENOUS | Status: DC

## 2019-06-20 NOTE — Patient Instructions (Addendum)
Medication Instructions:  Continue same medications *If you need a refill on your cardiac medications before your next appointment, please call your pharmacy*  Lab Work: Cbc,bmet today   Testing/Procedures: Cardiac cath scheduled Monday 06/25/19   Follow instructions below   Follow-Up: At [facility], you and your health needs are our priority.  As part of our continuing mission to provide you with exceptional heart care, we have created designated Provider Care Teams.  These Care Teams include your primary Cardiologist (physician) and Advanced Practice Providers (APPs -  Physician Assistants and Nurse Practitioners) who all work together to provide you with the care you need, when you need it.  Your next appointment:  Tuesday 07/17/19 at 11:40 am  The format for your next appointment:  Office  Provider: [place] [address] Dept: [phone] Loc: [place]  06/20/2019  You are scheduled for a Cardiac Cath on Monday 06/25/19  with Dr.Jordan.  1. Please arrive at the [facility] (Main Entrance A) at [facility]: [address] [place], [place] [address] at 8:30 am (This time is two hours before your procedure to ensure your preparation). Free valet parking service is available.   Special note: Every effort is made to have your procedure done on time. Please understand that emergencies sometimes delay scheduled procedures.  2. Diet: Do not eat solid foods after midnight.  The patient may have clear liquids until 5am upon the day of the procedure.  3. Labs: You will need to have blood drawn on Wed 12/9 at [place] do not need to be fasting.   Covid Test Thursday 06/21/19 at 12:05 pm [employer] Site Pre Procedure Line [facility] [employer]   4. Medication instructions in preparation for your  procedure:     Hold Xarelto 2 days before Cath    On the morning of your procedure, take Aspirin 81 mg and any morning medicines NOT listed above.  You may use sips of water.  5. Plan for one night stay--bring personal belongings. 6. Bring a current list of your medications and current insurance cards. 7. You MUST have a responsible person to drive you home. 8. Someone MUST be with you the first 24 hours after you arrive home or your discharge will be delayed. 9. Please wear clothes that are easy to get on and off and wear slip-on shoes.  Thank you for allowing [place] to care for you!   -- [facility]

## 2019-06-20 NOTE — Addendum Note (Signed)
Addended by: Kathyrn Lass on: 06/20/2019 02:08 PM   Modules accepted: Orders

## 2019-06-21 ENCOUNTER — Inpatient Hospital Stay (HOSPITAL_COMMUNITY): Admission: RE | Admit: 2019-06-21 | Payer: Medicare Other | Source: Ambulatory Visit

## 2019-06-21 ENCOUNTER — Other Ambulatory Visit: Payer: Self-pay

## 2019-06-21 ENCOUNTER — Telehealth: Payer: Medicare Other | Admitting: Physician Assistant

## 2019-06-21 ENCOUNTER — Ambulatory Visit (HOSPITAL_COMMUNITY): Admission: RE | Admit: 2019-06-21 | Payer: Medicare Other | Source: Home / Self Care | Admitting: Cardiology

## 2019-06-21 ENCOUNTER — Emergency Department (HOSPITAL_COMMUNITY)
Admission: EM | Admit: 2019-06-21 | Discharge: 2019-06-21 | Disposition: A | Discharge status: Home / Self Care | Payer: Medicare Other | Attending: Emergency Medicine | Admitting: Emergency Medicine

## 2019-06-21 ENCOUNTER — Encounter (HOSPITAL_COMMUNITY): Payer: Self-pay

## 2019-06-21 ENCOUNTER — Telehealth: Payer: Self-pay | Admitting: *Deleted

## 2019-06-21 ENCOUNTER — Telehealth: Payer: Self-pay | Admitting: Internal Medicine

## 2019-06-21 DIAGNOSIS — Z20828 Contact with and (suspected) exposure to other viral communicable diseases: Secondary | ICD-10-CM | POA: Insufficient documentation

## 2019-06-21 DIAGNOSIS — R7989 Other specified abnormal findings of blood chemistry: Secondary | ICD-10-CM

## 2019-06-21 DIAGNOSIS — Z7901 Long term (current) use of anticoagulants: Secondary | ICD-10-CM | POA: Diagnosis not present

## 2019-06-21 DIAGNOSIS — Z85828 Personal history of other malignant neoplasm of skin: Secondary | ICD-10-CM | POA: Diagnosis not present

## 2019-06-21 DIAGNOSIS — I2510 Atherosclerotic heart disease of native coronary artery without angina pectoris: Secondary | ICD-10-CM | POA: Insufficient documentation

## 2019-06-21 DIAGNOSIS — Z20822 Contact with and (suspected) exposure to covid-19: Secondary | ICD-10-CM

## 2019-06-21 DIAGNOSIS — E875 Hyperkalemia: Secondary | ICD-10-CM

## 2019-06-21 DIAGNOSIS — Z955 Presence of coronary angioplasty implant and graft: Secondary | ICD-10-CM | POA: Insufficient documentation

## 2019-06-21 DIAGNOSIS — Z79899 Other long term (current) drug therapy: Secondary | ICD-10-CM | POA: Diagnosis not present

## 2019-06-21 LAB — CBC WITH DIFFERENTIAL/PLATELET
Abs Immature Granulocytes: 0.03 10*3/uL (ref 0.00–0.07)
Basophils Absolute: 0 10*3/uL (ref 0.0–0.2)
Basophils Absolute: 0 10*3/uL (ref 0.0–0.1)
Basophils Relative: 0 %
Basos: 1 %
EOS (ABSOLUTE): 0.1 10*3/uL (ref 0.0–0.4)
Eos: 1 %
Eosinophils Absolute: 0.1 10*3/uL (ref 0.0–0.5)
Eosinophils Relative: 1 %
HCT: 43.2 % (ref 39.0–52.0)
Hematocrit: 45.4 % (ref 37.5–51.0)
Hemoglobin: 14.7 g/dL (ref 13.0–17.7)
Hemoglobin: 14 g/dL (ref 13.0–17.0)
Immature Grans (Abs): 0 10*3/uL (ref 0.0–0.1)
Immature Granulocytes: 0 %
Immature Granulocytes: 0 %
Lymphocytes Absolute: 1.2 10*3/uL (ref 0.7–3.1)
Lymphocytes Relative: 17 %
Lymphs Abs: 1.1 10*3/uL (ref 0.7–4.0)
Lymphs: 18 %
MCH: 30.1 pg (ref 26.6–33.0)
MCH: 30.9 pg (ref 26.0–34.0)
MCHC: 32.4 g/dL (ref 31.5–35.7)
MCHC: 32.4 g/dL (ref 30.0–36.0)
MCV: 93 fL (ref 79–97)
MCV: 95.4 fL (ref 80.0–100.0)
Monocytes Absolute: 0.6 10*3/uL (ref 0.1–0.9)
Monocytes Absolute: 0.8 10*3/uL (ref 0.1–1.0)
Monocytes Relative: 12 %
Monocytes: 9 %
Neutro Abs: 4.7 10*3/uL (ref 1.7–7.7)
Neutrophils Absolute: 4.8 10*3/uL (ref 1.4–7.0)
Neutrophils Relative %: 70 %
Neutrophils: 71 %
Platelets: 224 10*3/uL (ref 150–450)
Platelets: 209 10*3/uL (ref 150–400)
RBC: 4.89 x10E6/uL (ref 4.14–5.80)
RBC: 4.53 MIL/uL (ref 4.22–5.81)
RDW: 13.5 % (ref 11.6–15.4)
RDW: 14.7 % (ref 11.5–15.5)
WBC: 6.7 10*3/uL (ref 3.4–10.8)
WBC: 6.8 10*3/uL (ref 4.0–10.5)
nRBC: 0 % (ref 0.0–0.2)

## 2019-06-21 LAB — BASIC METABOLIC PANEL
Anion gap: 6 (ref 5–15)
BUN/Creatinine Ratio: 23 (ref 10–24)
BUN: 27 mg/dL (ref 8–27)
BUN: 25 mg/dL — ABNORMAL HIGH (ref 8–23)
CO2: 24 mmol/L (ref 20–29)
CO2: 26 mmol/L (ref 22–32)
Calcium: 9.5 mg/dL (ref 8.6–10.2)
Calcium: 8.6 mg/dL — ABNORMAL LOW (ref 8.9–10.3)
Chloride: 100 mmol/L (ref 96–106)
Chloride: 105 mmol/L (ref 98–111)
Creatinine, Ser: 1.19 mg/dL (ref 0.76–1.27)
Creatinine, Ser: 1.2 mg/dL (ref 0.61–1.24)
GFR calc Af Amer: 68 mL/min/{1.73_m2} (ref >59)
GFR calc Af Amer: >60 mL/min (ref >60)
GFR calc non Af Amer: 59 mL/min/{1.73_m2} — ABNORMAL LOW (ref >59)
GFR calc non Af Amer: 58 mL/min — ABNORMAL LOW (ref >60)
Glucose, Bld: 88 mg/dL (ref 70–99)
Glucose: 91 mg/dL (ref 65–99)
Potassium: 6.1 mmol/L (ref 3.5–5.2)
Potassium: 5.3 mmol/L — ABNORMAL HIGH (ref 3.5–5.1)
Sodium: 136 mmol/L (ref 134–144)
Sodium: 137 mmol/L (ref 135–145)

## 2019-06-21 LAB — RESPIRATORY PANEL BY RT PCR (FLU A&B, COVID)
Influenza A by PCR: NEGATIVE
Influenza B by PCR: NEGATIVE
SARS Coronavirus 2 by RT PCR: NEGATIVE

## 2019-06-21 LAB — PROTIME-INR
INR: 4 — ABNORMAL HIGH (ref 0.8–1.2)
Prothrombin Time: 38.6 seconds — ABNORMAL HIGH (ref 11.4–15.2)

## 2019-06-21 LAB — I-STAT CHEM 8, ED
BUN: 27 mg/dL — ABNORMAL HIGH (ref 8–23)
Calcium, Ion: 1.22 mmol/L (ref 1.15–1.40)
Chloride: 103 mmol/L (ref 98–111)
Creatinine, Ser: 1.3 mg/dL — ABNORMAL HIGH (ref 0.61–1.24)
Glucose, Bld: 83 mg/dL (ref 70–99)
HCT: 46 % (ref 39.0–52.0)
Hemoglobin: 15.6 g/dL (ref 13.0–17.0)
Potassium: 4.7 mmol/L (ref 3.5–5.1)
Sodium: 139 mmol/L (ref 135–145)
TCO2: 26 mmol/L (ref 22–32)

## 2019-06-21 LAB — APTT: aPTT: 43 seconds — ABNORMAL HIGH (ref 24–36)

## 2019-06-21 MED ORDER — SODIUM CHLORIDE 0.9 % IV BOLUS
500.0000 mL | Freq: Once | INTRAVENOUS | Status: AC
  Administered 2019-06-21: 500 mL via INTRAVENOUS

## 2019-06-21 NOTE — ED Provider Notes (Signed)
[facility] EMERGENCY DEPARTMENT Provider Note   CSN: [id number] Arrival date & time: 06/21/19  0745     History Chief Complaint  Patient presents with   Abnormal Lab    Jerry Barr is a 78 y.o. male.  Jerry Barr is a 78 y.o. male with history of CAD, clotting disorder, A. fib, dyslipidemia, who presents to the ED for evaluation of abnormal lab.  He had routine lab work done yesterday at cardiology office for a planned outpatient heart cath on Monday.  Was called this morning regarding elevated potassium of 6.1.  Patient did not have any other significant electrolyte derangements on lab work and had normal kidney function.  States that he went to his cardiologist office because he has been having pain similar to his anginal equivalent where he has pain in his neck and between his shoulder blades.  This pain is present with exertion and resolves with rest.  He had similar episodes of pain prior to his last 2 stent placements most recently in May of this year.  He is not having any current neck pain or pain in between his shoulder blades at rest, he denies shortness of breath.  Reports he occasionally has had some heartburn, none currently.  Denies any palpitations or shortness of breath, no lightheadedness or syncope.  Chart review shows the patient is not on any medications that would elevate his potassium.  I discussed with the patient and he does state that he had elevated potassium once before and found that he ate a lot of high potassium foods and wonders if this may be contributing.        Past Medical History:  Diagnosis Date   Allergy    BPH (benign prostatic hyperplasia)    Cancer (HCC)    basal cell ca - face, nose and right leg   Clotting disorder (HCC)    a fib hx - Xarelto   Coronary artery disease    Promus drug-eluting stent to a 99% mid LAD, 40-50% narrowing in the distal LAD with mild to moderate disease in the circ and RCA b. 5/6  PCI/DESx1 to mLcx   Dyslipidemia    ED (erectile dysfunction)    Hyperplastic colon polyp    Hypoglycemia     Patient Active Problem List   Diagnosis Date Noted   Acquired thrombophilia ([place]) 05/16/2019   Unstable angina (HCC)    Orthostatic hypotension 06/13/2017   Paroxysmal atrial fibrillation ([place]) 04/20/2016   Palpitations 04/20/2016   Opacity of lung on imaging study 04/20/2016   Atrial fibrillation with RVR ([place]) 04/20/2016   Chest pain    Dyslipidemia    CAD (coronary artery disease), native coronary artery 10/29/2014    Past Surgical History:  Procedure Laterality Date   CARDIOVERSION N/A 04/23/2016   Procedure: CARDIOVERSION;  Surgeon: [name], MD;  Location: [place];  Service: Cardiovascular;  Laterality: N/A;   COLONOSCOPY     CORONARY ANGIOPLASTY WITH STENT PLACEMENT     CORONARY STENT INTERVENTION N/A 11/15/2018   Procedure: CORONARY STENT INTERVENTION;  Surgeon: Burnell Blanks, MD;  Location: [place] CV LAB;  Service: Cardiovascular;  Laterality: N/A;   LEFT HEART CATH AND CORONARY ANGIOGRAPHY N/A 11/15/2018   Procedure: LEFT HEART CATH AND CORONARY ANGIOGRAPHY;  Surgeon: Burnell Blanks, MD;  Location: [place] CV [facility];  Service: Cardiovascular;  Laterality: N/A;   POLYPECTOMY     TEE WITHOUT CARDIOVERSION N/A 04/23/2016   Procedure: TRANSESOPHAGEAL ECHOCARDIOGRAM (  TEE);  Surgeon: [name], MD;  Location: [facility];  Service: Cardiovascular;  Laterality: N/A;   UPPER GASTROINTESTINAL ENDOSCOPY         Family History  Problem Relation Age of Onset   Breast cancer Mother    Colon cancer Mother    CAD Father    Heart attack Paternal Grandmother    Colon cancer Brother    Esophageal cancer Brother    Liver cancer Brother    Cancer Brother    Leukemia Paternal Uncle    Bone cancer Paternal Grandfather    Pancreatic cancer Neg Hx    Prostate cancer Neg Hx    Rectal cancer Neg Hx      Stomach cancer Neg Hx     Social History   Tobacco Use   Smoking status: Never Smoker   Smokeless tobacco: Never Used  Substance Use Topics   Alcohol use: Yes    Alcohol/week: 3.0 standard drinks    Types: 3 Glasses of wine per week    Comment: red wine weekly   Drug use: No    Home Medications Prior to Admission medications   Medication Sig Start Date End Date Taking? Authorizing Provider  acetaminophen (TYLENOL) 500 MG tablet Take 500 mg by mouth every 6 (six) hours as needed for headache (pain).   Yes [provider]  amiodarone (PACERONE) 200 MG tablet Take 1 tablet (200 mg total) by mouth daily. 06/05/19  Yes Martinique, Peter M, MD  atorvastatin (LIPITOR) 20 MG tablet Take 1 tablet (20 mg total) by mouth daily. 11/13/18  Yes Lendon Colonel, NP  Cholecalciferol (VITAMIN D-3) 1000 UNITS CAPS Take 1,000 Units by mouth daily.   Yes [provider]  clopidogrel (PLAVIX) 75 MG tablet Take 1 tablet (75 mg total) by mouth daily. 12/05/18  Yes Almyra Deforest, PA  famotidine (PEPCID) 20 MG tablet Take 1 tablet (20 mg total) by mouth 2 (two) times daily as needed for heartburn or indigestion. 06/19/19  Yes Martinique, Peter M, MD  fluticasone [facility]) 50 MCG/ACT nasal spray Place 1 spray into both nostrils daily as needed (seasonal allergies).    Yes [provider]  Ketotifen Fumarate (ALLERGY EYE DROPS OP) Place 1 drop into both eyes daily as needed (allergies).   Yes [provider]  loratadine (CLARITIN) 10 MG tablet Take 10 mg by mouth as needed for allergies.    Yes [provider]  Melatonin 3 MG TABS Take 6 mg by mouth at bedtime as needed (sleep aid).    Yes [provider]  midodrine (PROAMATINE) 5 MG tablet Take 5 mg by mouth daily as needed (low blood pressure).  03/15/18  Yes Martinique, Peter M, MD  Wheat Dextrin (BENEFIBER PO) Take 30 mLs by mouth daily as needed (Constipation).    Yes [provider]  XARELTO 20 MG TABS  tablet TAKE 1 TABLET (20 MG TOTAL) BY MOUTH DAILY WITH SUPPER. Patient taking differently: Take 20 mg by mouth daily with supper.  05/15/19  Yes Martinique, Peter M, MD    Allergies    Penicillins  Review of Systems   Review of Systems  Constitutional: Negative for chills and fever.  HENT: Negative.   Respiratory: Negative for cough and shortness of breath.   Cardiovascular: Negative for chest pain and leg swelling.  Gastrointestinal: Negative for abdominal pain, nausea and vomiting.  Musculoskeletal: Positive for back pain and neck pain.       Pain in his neck and  in between his shoulder blades with activity that is relieved with rest  Skin: Negative for color change and rash.  Neurological: Negative for syncope, weakness and numbness.  All other systems reviewed and are negative.   Physical Exam Updated Vital Signs BP 110/66 (BP Location: Left Arm)    Pulse 70    Temp 97.9 F (36.6 C) (Oral)    Resp 16    SpO2 100%   Physical Exam Vitals and nursing note reviewed.  Constitutional:      General: He is not in acute distress.    Appearance: Normal appearance. He is well-developed and normal weight. He is not ill-appearing or diaphoretic.     Comments: Well-appearing and in no distress  HENT:     Head: Normocephalic and atraumatic.  Eyes:     General:        Right eye: No discharge.        Left eye: No discharge.     Pupils: Pupils are equal, round, and reactive to light.  Cardiovascular:     Rate and Rhythm: Normal rate and regular rhythm.     Heart sounds: Normal heart sounds.  Pulmonary:     Effort: Pulmonary effort is normal. No respiratory distress.     Breath sounds: Normal breath sounds. No wheezing or rales.     Comments: Respirations equal and unlabored, patient able to speak in full sentences, lungs clear to auscultation bilaterally Abdominal:     General: Bowel sounds are normal. There is no distension.     Palpations: Abdomen is soft. There is no mass.      Tenderness: There is no abdominal tenderness. There is no guarding.     Comments: Abdomen soft, nondistended, nontender to palpation in all quadrants without guarding or peritoneal signs  Musculoskeletal:        General: No deformity.     Cervical back: Neck supple.  Skin:    General: Skin is warm and dry.     Capillary Refill: Capillary refill takes less than 2 seconds.  Neurological:     Mental Status: He is alert.     Coordination: Coordination normal.     Comments: Speech is clear, able to follow commands Moves extremities without ataxia, coordination intact  Psychiatric:        Mood and Affect: Mood normal.        Behavior: Behavior normal.     ED Results / Procedures / Treatments   Labs (all labs ordered are listed, but only abnormal results are displayed) Labs Reviewed  BASIC METABOLIC PANEL - Abnormal; Notable for the following components:      Result Value   Potassium 5.3 (*)    BUN 25 (*)    Calcium 8.6 (*)    GFR calc non Af Amer 58 (*)    All other components within normal limits  PROTIME-INR - Abnormal; Notable for the following components:   Prothrombin Time 38.6 (*)    INR 4.0 (*)    All other components within normal limits  APTT - Abnormal; Notable for the following components:   aPTT 43 (*)    All other components within normal limits  I-STAT CHEM 8, ED - Abnormal; Notable for the following components:   BUN 27 (*)    Creatinine, Ser 1.30 (*)    All other components within normal limits  RESPIRATORY PANEL BY RT PCR (FLU A&B, COVID)  CBC WITH DIFFERENTIAL/PLATELET    EKG EKG Interpretation  Date/Time:  Thursday  June 21 2019 07:59:59 EST Ventricular Rate:  62 PR Interval:    QRS Duration: 100 QT Interval:  456 QTC Calculation: 462 R Axis:   77 Text Interpretation: Probable sinus Nonspecific ST abnormality Abnormal ECG similar to prior 11/20 Confirmed by Aletta Edouard [phone]) on 06/21/2019 8:32:25 AM   Radiology No results  found.  Procedures Procedures (including critical care time)  Medications Ordered in ED Medications  sodium chloride 0.9 % bolus 500 mL (0 mLs Intravenous Stopped 06/21/19 1004)    ED Course  I have reviewed the triage vital signs and the nursing notes.  Pertinent labs & imaging results that were available during my care of the patient were reviewed by me and considered in my medical decision making (see chart for details).  Clinical Course as of Jun 20 1241  Thu Jun 21, 2019  0820 Sent for evaluation of hyperkalemia on preop lab work from yesterday.  I-STAT shows normal potassium of 4.7.  Does show slight elevation in creatinine from baseline at 1.3 with elevated BUN as well suspect slight dehydration.  Will give 500 cc fluid bolus.  Will discuss with cardiology  I-stat chem 8, ED (not at [facility] or [facility])(!) [KF]  [phone] Case discussed with PA Fabian Sharp with cardiology, who initially reached out to Dr. Martinique, and they felt that if the patient wanted to he could be admitted and have cath performed today since he is already here as long as his creatinine improves after IV fluid bolus.  Discussed with patient who would prefer to have cath done today if possible.  Will change Covid test to rapid and get formal lab work, PT and PTT   [KF]  1000 Cardiology at bedside and after reviewing patient's chart because he is on Xarelto and took a dose this morning prior to coming to the hospital he will not be able to have heart cath done today, but they agree with getting lab work to ensure that creatinine has improved, after Covid test today he will quarantine at home and will proceed with outpatient heart cath on Monday at 830 as planned   [KF]  1030 Repeat be met shows improvement in creatinine, potassium minimally elevated at 5.3, patient is not on any medications like ACE or ARB, or spironolactone that would elevate potassium, but in talking with him he does eat many foods that may be high in potassium,  will provide him with a list to help limit these foods and encourage p.o. fluid intake over the weekend.  Cards will likely recheck BMP prior to cath on Monday morning  Basic metabolic panel(!) [KF]  1607 INR and PTT elevated likely in the setting of Xarelto use, patient has been instructed to stop Xarelto after Friday morning dose in preparation for patient's heart cath on Monday.  INR(!): 4.0 [KF]  1030 Negative preprocedure Covid test, patient will quarantine at home until cath on Monday  Respiratory Panel by RT PCR (Flu A&B, Covid) - Nasopharyngeal Swab [KF]    Clinical Course User Index [KF] Jacqlyn Larsen, PA-C [MB] Hayden Rasmussen, MD   MDM Rules/Calculators/A&P  78 year old male sent by cardiology office for elevated potassium of 6.1 on routine preop labs done yesterday, repeat i-STAT Chem-8 here shows normal potassium but slight elevation in creatinine, patient was given IV fluids and case was discussed with cardiology initially they had discussed doing cath today since patient is here but given that he has still been taking his Xarelto he will need to hold  this over the weekend and return as planned on Monday for outpatient heart cath.  Repeat labs after fluid show improvement in creatinine, minimally elevated potassium likely due to dietary intake, patient has been given information to reduce this.  Preprocedural Covid test negative, patient will quarantine at home.  Discharged home in good condition.  Patient discussed with Dr. Melina Copa, who saw patient as well and agrees with plan.   Final Clinical Impression(s) / ED Diagnoses Final diagnoses:  Elevated serum creatinine  Hyperkalemia  Encounter for laboratory testing for COVID-19 virus    Rx / DC Orders ED Discharge Orders    None       Janet Berlin 06/21/19 1253    Hayden Rasmussen, MD 06/21/19 1919

## 2019-06-21 NOTE — Telephone Encounter (Signed)
Cardiology Moonlighter Note  Returned page from [employer] from yesterday showing K of 6.1. All other labs within normal limits. Patient not on any medications that cause hyperkalemia. Will contact patient and recommend he come to ED for workup.   Will send note to ordering physician Dr. Martinique  Anthony Ella Bodo, MD Cardiology Fellow, PGY-7

## 2019-06-21 NOTE — Telephone Encounter (Signed)
Cardiology Moonlighter Note  Spoke with patient. Recommended he come to the ED to have potassium rechecked and receive treatment for hyperkalemia. Told him there is a chance that he might need admission but unsure about this.   Patient notes he has a COVID test scheduled for today. This is a routine screening testing in preparation for his scheduled cardiac catheterization this week. I told him we could do this for him in the ED.  Patient will come to ED this AM. Will cc Dr. Martinique on this note.   Marcie Mowers, MD Cardiology Fellow, PGY-7

## 2019-06-21 NOTE — Telephone Encounter (Signed)
Pt contacted pre-catheterization scheduled at [facility] for: Monday May 26, 2019 10:30 AM Verified arrival time and place: [facility]) at: 8:30 AM   No solid food after midnight prior to cath, clear liquids until 5 AM day of procedure. Contrast allergy: no  Hold: Xarelto-last dose 06/22/19 until post procedure.  Except hold medications AM meds can be  taken pre-cath with sip of water including: ASA 81 mg Plavix 75 mg  Confirmed patient has responsible adult to drive home post procedure and observe 24 hours after arriving home: yes  Currently, due to Covid-19 pandemic, only one support person will be allowed with patient. Must be the same support person for that patient's entire stay, will be screened and required to wear a mask. They will be asked to wait in the waiting room for the duration of the patient's stay.  Patients are required to wear a mask when they enter the hospital.      COVID-19 Pre-Screening Questions:  . In the past 7 to 10 days have you had a cough,  shortness of breath, headache, congestion, fever (100 or greater) body aches, chills, sore throat, or sudden loss of taste or sense of smell? no . Have you been around anyone with known Covid 19? no . Have you been around anyone who is awaiting Covid 19 test results in the past 7 to 10 days? no . Have you been around anyone who has been exposed to Covid 19, or has mentioned symptoms of Covid 19 within the past 7 to 10 days? no  I reviewed procedure/mask/visitor instructions, Covid-19 screening questions with patient, he verbalized understanding, thanked me for call.

## 2019-06-21 NOTE — Discharge Instructions (Addendum)
Your kidney function was slightly elevated today, and your repeat potassium came back just slightly above normal.  Please make sure you are drinking plenty of fluids.  After your Friday morning dose of Xarelto do not take any more doses in preparation for your heart cath on Monday morning at 8:30 AM with Dr. Martinique  If you have worsening pain over the weekend or any other new or concerning symptoms return to the ED.

## 2019-06-21 NOTE — ED Triage Notes (Signed)
Pt called by his dr this morning due to a K level of 6.1. pt is scheduled for a heart cath Monday and scheduled for covid test today at 1205 at [facility]. Axox4., VSS. Denies CP, palpitations, shob.

## 2019-06-25 ENCOUNTER — Ambulatory Visit (HOSPITAL_COMMUNITY)
Admission: RE | Admit: 2019-06-25 | Discharge: 2019-06-25 | Disposition: A | Discharge status: Home / Self Care | Payer: Medicare Other | Attending: Cardiology | Admitting: Cardiology

## 2019-06-25 ENCOUNTER — Other Ambulatory Visit: Payer: Self-pay

## 2019-06-25 ENCOUNTER — Encounter (HOSPITAL_COMMUNITY)
Admission: RE | Disposition: A | Discharge status: Home / Self Care | Payer: Self-pay | Source: Home / Self Care | Attending: Cardiology

## 2019-06-25 DIAGNOSIS — Z7902 Long term (current) use of antithrombotics/antiplatelets: Secondary | ICD-10-CM | POA: Insufficient documentation

## 2019-06-25 DIAGNOSIS — I25119 Atherosclerotic heart disease of native coronary artery with unspecified angina pectoris: Secondary | ICD-10-CM | POA: Diagnosis present

## 2019-06-25 DIAGNOSIS — I951 Orthostatic hypotension: Secondary | ICD-10-CM | POA: Diagnosis not present

## 2019-06-25 DIAGNOSIS — N400 Enlarged prostate without lower urinary tract symptoms: Secondary | ICD-10-CM | POA: Insufficient documentation

## 2019-06-25 DIAGNOSIS — I480 Paroxysmal atrial fibrillation: Secondary | ICD-10-CM | POA: Diagnosis not present

## 2019-06-25 DIAGNOSIS — Z955 Presence of coronary angioplasty implant and graft: Secondary | ICD-10-CM | POA: Insufficient documentation

## 2019-06-25 DIAGNOSIS — G479 Sleep disorder, unspecified: Secondary | ICD-10-CM | POA: Insufficient documentation

## 2019-06-25 DIAGNOSIS — Z7901 Long term (current) use of anticoagulants: Secondary | ICD-10-CM | POA: Insufficient documentation

## 2019-06-25 DIAGNOSIS — M25519 Pain in unspecified shoulder: Secondary | ICD-10-CM | POA: Diagnosis not present

## 2019-06-25 DIAGNOSIS — E785 Hyperlipidemia, unspecified: Secondary | ICD-10-CM | POA: Diagnosis present

## 2019-06-25 DIAGNOSIS — Z79899 Other long term (current) drug therapy: Secondary | ICD-10-CM | POA: Diagnosis not present

## 2019-06-25 DIAGNOSIS — I209 Angina pectoris, unspecified: Secondary | ICD-10-CM | POA: Diagnosis not present

## 2019-06-25 DIAGNOSIS — I2510 Atherosclerotic heart disease of native coronary artery without angina pectoris: Secondary | ICD-10-CM | POA: Diagnosis present

## 2019-06-25 DIAGNOSIS — I200 Unstable angina: Secondary | ICD-10-CM

## 2019-06-25 HISTORY — PX: LEFT HEART CATH AND CORONARY ANGIOGRAPHY: CATH118249

## 2019-06-25 SURGERY — LEFT HEART CATH AND CORONARY ANGIOGRAPHY
Anesthesia: LOCAL

## 2019-06-25 MED ORDER — HEPARIN (PORCINE) IN NACL 1000-0.9 UT/500ML-% IV SOLN
INTRAVENOUS | Status: AC
  Filled 2019-06-25: qty 1000

## 2019-06-25 MED ORDER — FAMOTIDINE 20 MG PO TABS
20.0000 mg | ORAL_TABLET | Freq: Once | ORAL | Status: AC
  Administered 2019-06-25: 20 mg via ORAL
  Filled 2019-06-25: qty 1

## 2019-06-25 MED ORDER — ACETAMINOPHEN 325 MG PO TABS: 650.0000 mg | ORAL_TABLET | ORAL | Status: DC | PRN

## 2019-06-25 MED ORDER — SODIUM CHLORIDE 0.9% FLUSH: 3.0000 mL | INTRAVENOUS | Status: DC | PRN

## 2019-06-25 MED ORDER — LIDOCAINE HCL (PF) 1 % IJ SOLN
INTRAMUSCULAR | Status: DC | PRN
  Administered 2019-06-25: 2 mL

## 2019-06-25 MED ORDER — FENTANYL CITRATE (PF) 100 MCG/2ML IJ SOLN
INTRAMUSCULAR | Status: DC | PRN
  Administered 2019-06-25: 25 ug via INTRAVENOUS

## 2019-06-25 MED ORDER — SODIUM CHLORIDE 0.9% FLUSH: 3.0000 mL | Freq: Two times a day (BID) | INTRAVENOUS | Status: DC

## 2019-06-25 MED ORDER — ASPIRIN 81 MG PO CHEW: 81.0000 mg | CHEWABLE_TABLET | ORAL | Status: DC

## 2019-06-25 MED ORDER — HEPARIN SODIUM (PORCINE) 1000 UNIT/ML IJ SOLN
INTRAMUSCULAR | Status: AC
  Filled 2019-06-25: qty 1

## 2019-06-25 MED ORDER — SODIUM CHLORIDE 0.9 % IV SOLN: 250.0000 mL | INTRAVENOUS | Status: DC | PRN

## 2019-06-25 MED ORDER — SODIUM CHLORIDE 0.9 % WEIGHT BASED INFUSION: 1.0000 mL/kg/h | INTRAVENOUS | Status: DC

## 2019-06-25 MED ORDER — ONDANSETRON HCL 4 MG/2ML IJ SOLN: 4.0000 mg | Freq: Four times a day (QID) | INTRAMUSCULAR | Status: DC | PRN

## 2019-06-25 MED ORDER — VERAPAMIL HCL 2.5 MG/ML IV SOLN
INTRAVENOUS | Status: AC
  Filled 2019-06-25: qty 2

## 2019-06-25 MED ORDER — IOHEXOL 350 MG/ML SOLN
INTRAVENOUS | Status: DC | PRN
  Administered 2019-06-25: 55 mL

## 2019-06-25 MED ORDER — LIDOCAINE HCL (PF) 1 % IJ SOLN
INTRAMUSCULAR | Status: AC
  Filled 2019-06-25: qty 30

## 2019-06-25 MED ORDER — MIDAZOLAM HCL 2 MG/2ML IJ SOLN
INTRAMUSCULAR | Status: DC | PRN
  Administered 2019-06-25: 1 mg via INTRAVENOUS

## 2019-06-25 MED ORDER — FENTANYL CITRATE (PF) 100 MCG/2ML IJ SOLN
INTRAMUSCULAR | Status: AC
  Filled 2019-06-25: qty 2

## 2019-06-25 MED ORDER — VERAPAMIL HCL 2.5 MG/ML IV SOLN
INTRAVENOUS | Status: DC | PRN
  Administered 2019-06-25: 10 mL via INTRA_ARTERIAL

## 2019-06-25 MED ORDER — HEPARIN (PORCINE) IN NACL 1000-0.9 UT/500ML-% IV SOLN
INTRAVENOUS | Status: DC | PRN
  Administered 2019-06-25 (×2): 500 mL

## 2019-06-25 MED ORDER — MIDAZOLAM HCL 2 MG/2ML IJ SOLN
INTRAMUSCULAR | Status: AC
  Filled 2019-06-25: qty 2

## 2019-06-25 MED ORDER — HEPARIN SODIUM (PORCINE) 1000 UNIT/ML IJ SOLN
INTRAMUSCULAR | Status: DC | PRN
  Administered 2019-06-25: 4000 [IU] via INTRAVENOUS

## 2019-06-25 MED ORDER — SODIUM CHLORIDE 0.9 % WEIGHT BASED INFUSION
3.0000 mL/kg/h | INTRAVENOUS | Status: AC
  Administered 2019-06-25: 3 mL/kg/h via INTRAVENOUS

## 2019-06-25 MED ORDER — RIVAROXABAN 20 MG PO TABS: 20.0000 mg | ORAL_TABLET | Freq: Every day | ORAL | Status: DC

## 2019-06-25 SURGICAL SUPPLY — 10 items
CATH 5FR JL3.5 JR4 ANG PIG MP (CATHETERS) ×1 IMPLANT
DEVICE RAD COMP TR BAND LRG (VASCULAR PRODUCTS) ×1 IMPLANT
GLIDESHEATH SLEND SS 6F .021 (SHEATH) ×1 IMPLANT
GUIDEWIRE INQWIRE 1.5J.035X260 (WIRE) IMPLANT
INQWIRE 1.5J .035X260CM (WIRE) ×2
KIT HEART LEFT (KITS) ×2 IMPLANT
PACK CARDIAC CATHETERIZATION (CUSTOM PROCEDURE TRAY) ×2 IMPLANT
SYR MEDRAD MARK 7 150ML (SYRINGE) ×2 IMPLANT
TRANSDUCER W/STOPCOCK (MISCELLANEOUS) ×2 IMPLANT
TUBING CIL FLEX 10 FLL-RA (TUBING) ×2 IMPLANT

## 2019-06-25 NOTE — Interval H&P Note (Signed)
History and Physical Interval Note:  06/25/2019 12:26 PM  Jerry Barr  has presented today for surgery, with the diagnosis of unstable angina.  The various methods of treatment have been discussed with the patient and family. After consideration of risks, benefits and other options for treatment, the patient has consented to  Procedure(s): LEFT HEART CATH AND CORONARY ANGIOGRAPHY (N/A) as a surgical intervention.  The patient's history has been reviewed, patient examined, no change in status, stable for surgery.  I have reviewed the patient's chart and labs.  Questions were answered to the patient's satisfaction.    Cath Lab Visit (complete for each Cath Lab visit)  Clinical Evaluation Leading to the Procedure:   ACS: No.  Non-ACS:    Anginal Classification: CCS III  Anti-ischemic medical therapy: No Therapy  Non-Invasive Test Results: No non-invasive testing performed  Prior CABG: No previous CABG       [name] 06/25/2019 12:26 PM

## 2019-06-25 NOTE — Progress Notes (Signed)
Discharge instructions reviewed with patient and family. Verbalized understanding. 

## 2019-06-25 NOTE — Discharge Instructions (Signed)
Radial Site Care ° °This sheet gives you information about how to care for yourself after your procedure. Your health care provider may also give you more specific instructions. If you have problems or questions, contact your health care provider. °What can I expect after the procedure? °After the procedure, it is common to have: °· Bruising and tenderness at the catheter insertion area. °Follow these instructions at home: °Medicines °· Take over-the-counter and prescription medicines only as told by your health care provider. °Insertion site care °· Follow instructions from your health care provider about how to take care of your insertion site. Make sure you: °? Wash your hands with soap and water before you change your bandage (dressing). If soap and water are not available, use hand sanitizer. °? Change your dressing as told by your health care provider. °? Leave stitches (sutures), skin glue, or adhesive strips in place. These skin closures may need to stay in place for 2 weeks or longer. If adhesive strip edges start to loosen and curl up, you may trim the loose edges. Do not remove adhesive strips completely unless your health care provider tells you to do that. °· Check your insertion site every day for signs of infection. Check for: °? Redness, swelling, or pain. °? Fluid or blood. °? Pus or a bad smell. °? Warmth. °· Do not take baths, swim, or use a hot tub until your health care provider approves. °· You may shower 24-48 hours after the procedure, or as directed by your health care provider. °? Remove the dressing and gently wash the site with plain soap and water. °? Pat the area dry with a clean towel. °? Do not rub the site. That could cause bleeding. °· Do not apply powder or lotion to the site. °Activity ° °· For 24 hours after the procedure, or as directed by your health care provider: °? Do not flex or bend the affected arm. °? Do not push or pull heavy objects with the affected arm. °? Do not  drive yourself home from the hospital or clinic. You may drive 24 hours after the procedure unless your health care provider tells you not to. °? Do not operate machinery or power tools. °· Do not lift anything that is heavier than 10 lb (4.5 kg), or the limit that you are told, until your health care provider says that it is safe. °· Ask your health care provider when it is okay to: °? Return to work or school. °? Resume usual physical activities or sports. °? Resume sexual activity. °General instructions °· If the catheter site starts to bleed, raise your arm and put firm pressure on the site. If the bleeding does not stop, get help right away. This is a medical emergency. °· If you went home on the same day as your procedure, a responsible adult should be with you for the first 24 hours after you arrive home. °· Keep all follow-up visits as told by your health care provider. This is important. °Contact a health care provider if: °· You have a fever. °· You have redness, swelling, or yellow drainage around your insertion site. °Get help right away if: °· You have unusual pain at the radial site. °· The catheter insertion area swells very fast. °· The insertion area is bleeding, and the bleeding does not stop when you hold steady pressure on the area. °· Your arm or hand becomes pale, cool, tingly, or numb. °These symptoms may represent a serious problem   that is an emergency. Do not wait to see if the symptoms will go away. Get medical help right away. Call your local emergency services (911 in the [place] Do not drive yourself to the hospital. °Summary °· After the procedure, it is common to have bruising and tenderness at the site. °· Follow instructions from your health care provider about how to take care of your radial site wound. Check the wound every day for signs of infection. °· Do not lift anything that is heavier than 10 lb (4.5 kg), or the limit that you are told, until your health care provider says  that it is safe. °This information is not intended to replace advice given to you by your health care provider. Make sure you discuss any questions you have with your health care provider. °Document Released: 07/31/2010 Document Revised: 08/03/2017 Document Reviewed: 08/03/2017 °Elsevier Patient Education © 2020 [employer] ° °

## 2019-07-12 NOTE — Progress Notes (Signed)
Date:  07/12/2019   ID:  Jerry Barr, DOB 1941-05-31, MRN [id number]   PCP:  Antony Contras, MD  Cardiologist:  Cort Dragoo Martinique, MD  Electrophysiologist:  None   Evaluation Performed:  Follow-Up Visit  Chief Complaint: shoulder pain.   History of Present Illness:    Jerry Barr is a 78 y.o. male seen for follow up post cardiac cath. He has a  PMH of HLD, chronic hypotension, PAF on Xarelto, CAD s/p DES to LAD 2011 in [place] and BPH. He was diagnosed with new afib in 04/2016. Echo in 04/2016 showed EF 50-55%, no RWMA, mild MR. He had successful TEE DCCV 04/23/2016. He was placed on midodrine for orthostatic hypotension. He is not on any rate control therapy for the same reason. Myoview  In 06/2017 showed EF 55%, no significant ischemia or scar. Echo in 06/2017 EF 45-50%, mild diffuse hypokinesis, PA peak pressure 2mmHg.  Patient was seen in the  ED in April  with recurrent afib and fatigue. Given his history of orthostatic hypotension, it was elected to proceed with cardioversion in the ED. This was performed by Dr. Haroldine Laws. Post cardioversion, his amiodarone was increased to 200mg  BID. After discharge, he was seen in the afib clinic with plans to consider decreasing amiodarone back to 200mg  daily after 1 month if no recurrent afib.   He was seen on 10/30/2018 for post hospital follow-up, at which time he noted  shoulder blade pain that occur with  walking. He had a  cardiac catheterization on 11/15/2018 which revealed 20% proximal RCA, 90% mid left circumflex lesion treated with a 2.25 x 16 mm Synergy DES, EF 55 to 65%.  Postprocedure, patient was discharged on aspirin, Plavix and Xarelto.  The plan is to stop aspirin after 1 month.  He will require a minimum of 6 months of Plavix.   When seen in August he felt great.  He still had some days with orthostasis.  Wears support hose and uses midodrine in am.  He has been diagnosed with an REM sleep disorder managed by Dr Maxwell Caul. He was  seen in the Afib clinic on 05/16/19 and was maintaining NSR on 200 mg of amiodarone. More recently  he reported recurrent exertional neck and pain in his shoulder blade that resolves with rest. This may occur in the am with initial activity and also with his daily walk. Sometimes if he continues to walk it goes away after 15 minutes. He also notes increased heartburn in the past 2 weeks. These symptoms are similar to what he had in May and in 2011 with his initial stent. He underwent repeat Cardiac cath on 06/25/19 and this showed only nonobstructive disease with patent stents.   Potassium has been elevated and he has reduced his intake of high potassium foods. He states since his last procedure he has felt better than he has all year. No further pain.     Past Medical History:  Diagnosis Date  . Allergy   . BPH (benign prostatic hyperplasia)   . Cancer (HCC)    basal cell ca - face, nose and right leg  . Clotting disorder (HCC)    a fib hx - Xarelto  . Coronary artery disease    Promus drug-eluting stent to a 99% mid LAD, 40-50% narrowing in the distal LAD with mild to moderate disease in the circ and RCA b. 5/6 PCI/DESx1 to mLcx  . Dyslipidemia   . ED (erectile dysfunction)   . Hyperplastic colon  polyp   . Hypoglycemia    Past Surgical History:  Procedure Laterality Date  . CARDIOVERSION N/A 04/23/2016   Procedure: CARDIOVERSION;  Surgeon: [name], MD;  Location: [facility] ENDOSCOPY;  Service: Cardiovascular;  Laterality: N/A;  . COLONOSCOPY    . CORONARY ANGIOPLASTY WITH STENT PLACEMENT    . CORONARY STENT INTERVENTION N/A 11/15/2018   Procedure: CORONARY STENT INTERVENTION;  Surgeon: Burnell Blanks, MD;  Location: [facility] LAB;  Service: Cardiovascular;  Laterality: N/A;  . LEFT HEART CATH AND CORONARY ANGIOGRAPHY N/A 11/15/2018   Procedure: LEFT HEART CATH AND CORONARY ANGIOGRAPHY;  Surgeon: Burnell Blanks, MD;  Location: [facility];  Service:  Cardiovascular;  Laterality: N/A;  . LEFT HEART CATH AND CORONARY ANGIOGRAPHY N/A 06/25/2019   Procedure: LEFT HEART CATH AND CORONARY ANGIOGRAPHY;  Surgeon: Martinique, Kariya Lavergne M, MD;  Location: [facility];  Service: Cardiovascular;  Laterality: N/A;  . POLYPECTOMY    . TEE WITHOUT CARDIOVERSION N/A 04/23/2016   Procedure: TRANSESOPHAGEAL ECHOCARDIOGRAM (TEE);  Surgeon: [name], MD;  Location: [facility];  Service: Cardiovascular;  Laterality: N/A;  . UPPER GASTROINTESTINAL ENDOSCOPY       No outpatient medications have been marked as taking for the 07/17/19 encounter (Appointment) with Martinique, Drucilla Cumber M, MD.   Current Facility-Administered Medications for the 07/17/19 encounter (Appointment) with Martinique, Malika Demario M, MD  Medication  . sodium chloride flush (NS) 0.9 % injection 3 mL     Allergies:   Penicillins   Social History   Tobacco Use  . Smoking status: Never Smoker  . Smokeless tobacco: Never Used  Substance Use Topics  . Alcohol use: Yes    Alcohol/week: 3.0 standard drinks    Types: 3 Glasses of wine per week    Comment: red wine weekly  . Drug use: No     Family Hx: The patient's family history includes Bone cancer in his paternal grandfather; Breast cancer in his mother; CAD in his father; Cancer in his brother; Colon cancer in his brother and mother; Esophageal cancer in his brother; Heart attack in his paternal grandmother; Leukemia in his paternal uncle; Liver cancer in his brother. There is no history of Pancreatic cancer, Prostate cancer, Rectal cancer, or Stomach cancer.  ROS:   Please see the history of present illness.     All other systems reviewed and are negative.   Prior CV studies:   The following studies were reviewed today:  Echo 06/13/17: Study Conclusions  - Left ventricle: The cavity size was normal. Systolic function was   mildly reduced. The estimated ejection fraction was in the range   of 45% to 50%. Mild diffuse hypokinesis. Left  ventricular   diastolic function parameters were normal. - Aortic valve: Transvalvular velocity was within the normal range.   There was no stenosis. There was trivial regurgitation. - Mitral valve: There was trivial regurgitation. - Left atrium: The atrium was moderately dilated. - Right ventricle: The cavity size was normal. Wall thickness was   normal. Systolic function was normal. - Right atrium: The atrium was severely dilated. - Atrial septum: No defect or patent foramen ovale was identified   by color flow Doppler. - Tricuspid valve: There was trivial regurgitation. - Pulmonary arteries: Systolic pressure was within the normal   range. PA peak pressure: 24 mm Hg (S).  Cath 11/15/2018  Prox RCA lesion is 20% stenosed.  Mid Cx lesion is 90% stenosed.  Previously placed Prox LAD to Mid  LAD stent (unknown type) is widely patent.  A drug-eluting stent was successfully placed using a STENT SYNERGY DES 2.25X16.  Post intervention, there is a 0% residual stenosis.  The left ventricular systolic function is normal.  LV end diastolic pressure is normal.  The left ventricular ejection fraction is 55-65% by visual estimate.  There is no mitral valve regurgitation.   1. Patent mid LAD stent without restenosis 2. Severe stenosis mid Circumflex artery.  3. Successful PTCA/DES x 1 mid Circumflex 4. Mild non-obstructive disease in the RCA 5. Normal LV systolic function  Recommendations: ASA and Plavix for one month then stop ASA. Will restart Xarelto tomorrow. Would recommend at least six month of Plavix and Xarelto but could consider stopping Plavix at 6 months if necessary. Likely same day PCI discharge.    Cardiac cath 06/25/19: Procedures  LEFT HEART CATH AND CORONARY ANGIOGRAPHY  Conclusion    Non-stenotic Prox LAD to Mid LAD lesion was previously treated.  Previously placed Mid Cx drug eluting stent is widely patent.  Balloon angioplasty was performed.  Prox RCA  lesion is 20% stenosed.  The left ventricular systolic function is normal.  LV end diastolic pressure is normal.  The left ventricular ejection fraction is 55-65% by visual estimate.   1. Nonobstructive CAD- patent stents in LAD and LCx. 2. Normal LV function 3. Normal LVEDP  Plan: continue medical therapy.      Labs/Other Tests and Data Reviewed:     Recent Labs: 10/15/2018: Magnesium 2.2 03/23/2019: ALT 21; TSH 2.620 06/21/2019: BUN 25; Creatinine, Ser 1.20; Hemoglobin 14.0; Platelets 209; Potassium 5.3; Sodium 137   Recent Lipid Panel Lab Results  Component Value Date/Time   CHOL 120 03/23/2019 08:38 AM   TRIG 42 03/23/2019 08:38 AM   HDL 50 03/23/2019 08:38 AM   CHOLHDL 2.4 03/23/2019 08:38 AM   CHOLHDL 3.0 06/29/2016 10:11 AM   LDLCALC 59 03/23/2019 08:38 AM    Wt Readings from Last 3 Encounters:  06/25/19 148 lb (67.1 kg)  06/20/19 151 lb 12.8 oz (68.9 kg)  05/16/19 152 lb 12.8 oz (69.3 kg)     Objective:    Vital Signs:  There were no vitals taken for this visit.   GENERAL:  Well appearing HEENT:  PERRL, EOMI, sclera are clear. Oropharynx is clear. NECK:  No jugular venous distention, carotid upstroke brisk and symmetric, no bruits, no thyromegaly or adenopathy LUNGS:  Clear to auscultation bilaterally CHEST:  Unremarkable HEART:  RRR,  PMI not displaced or sustained,S1 and S2 within normal limits, no S3, no S4: no clicks, no rubs, no murmurs ABD:  Soft, nontender. BS +, no masses or bruits. No hepatomegaly, no splenomegaly EXT:  2 + pulses throughout, no edema, no cyanosis no clubbing. No radial site hematoma SKIN:  Warm and dry.  No rashes NEURO:  Alert and oriented x 3. Cranial nerves II through XII intact. PSYCH:  Cognitively intact     ASSESSMENT & PLAN:    1.   CAD: s/p PCI of the LCx with DES in May.  Prior stent of the mid LAD in 2011. Recent repeat cardiac cath showed nonobstructive disease.   He is on  therapy with  Plavix and Xarelto.   Antianginal therapy limited by orthostatic hypotension.  2.   PAF: s/p DCCV on April 5.   No recurrence.  Continue Xarelto and amiodarone therapy.  Unable to tolerate any rate control therapy given history of chronic hypotension.   3.   Chronic orthostatic  hypotension: His blood pressure is stable. He reports some this is doing much better and he really hasn't had to use midodrine recently.  Continue supportive care.  4.   Hyperlipidemia: Continue current statin. Excellent control.  5. Elevated potassium. Decrease po intake.    Disposition:  6 month with fasting lab  Signed, Katryna Tschirhart Martinique, MD  07/12/2019 3:18 PM    [place]

## 2019-07-17 ENCOUNTER — Ambulatory Visit (INDEPENDENT_AMBULATORY_CARE_PROVIDER_SITE_OTHER): Payer: Medicare Other | Admitting: Cardiology

## 2019-07-17 ENCOUNTER — Other Ambulatory Visit: Payer: Self-pay

## 2019-07-17 ENCOUNTER — Encounter: Payer: Self-pay | Admitting: Cardiology

## 2019-07-17 VITALS — BP 112/67 | HR 61 | Temp 97.3°F | Ht 72.0 in | Wt 152.4 lb

## 2019-07-17 DIAGNOSIS — I25110 Atherosclerotic heart disease of native coronary artery with unstable angina pectoris: Secondary | ICD-10-CM | POA: Diagnosis not present

## 2019-07-17 DIAGNOSIS — I480 Paroxysmal atrial fibrillation: Secondary | ICD-10-CM

## 2019-07-17 DIAGNOSIS — E7800 Pure hypercholesterolemia, unspecified: Secondary | ICD-10-CM | POA: Diagnosis not present

## 2019-08-02 ENCOUNTER — Ambulatory Visit: Payer: Medicare Other | Attending: Internal Medicine

## 2019-08-02 DIAGNOSIS — Z23 Encounter for immunization: Secondary | ICD-10-CM | POA: Insufficient documentation

## 2019-08-02 NOTE — Progress Notes (Signed)
   Covid-19 Vaccination Clinic  Name:  Myon Warford    MRN: [id number] DOB: 1940/11/27  08/02/2019  Mr. Rung was observed post Covid-19 immunization for 15 minutes without incidence. He was provided with Vaccine Information Sheet and instruction to access the V-Safe system.   Mr. Gardipee was instructed to call 911 with any severe reactions post vaccine: Marland Kitchen Difficulty breathing  . Swelling of your face and throat  . A fast heartbeat  . A bad rash all over your body  . Dizziness and weakness    Immunizations Administered    Name Date Dose VIS Date Route   Pfizer COVID-19 Vaccine 08/02/2019  9:45 AM 0.3 mL 06/22/2019 Intramuscular   Manufacturer: [place]   Lot: [id number]   [place]: [id number]

## 2019-08-06 ENCOUNTER — Other Ambulatory Visit: Payer: Self-pay | Admitting: Adult Health

## 2019-08-09 DIAGNOSIS — I480 Paroxysmal atrial fibrillation: Secondary | ICD-10-CM | POA: Diagnosis not present

## 2019-08-09 DIAGNOSIS — I2510 Atherosclerotic heart disease of native coronary artery without angina pectoris: Secondary | ICD-10-CM | POA: Diagnosis not present

## 2019-08-09 DIAGNOSIS — E785 Hyperlipidemia, unspecified: Secondary | ICD-10-CM | POA: Diagnosis not present

## 2019-08-09 DIAGNOSIS — N401 Enlarged prostate with lower urinary tract symptoms: Secondary | ICD-10-CM | POA: Diagnosis not present

## 2019-08-09 DIAGNOSIS — I4891 Unspecified atrial fibrillation: Secondary | ICD-10-CM | POA: Diagnosis not present

## 2019-08-09 DIAGNOSIS — E7800 Pure hypercholesterolemia, unspecified: Secondary | ICD-10-CM | POA: Diagnosis not present

## 2019-08-23 ENCOUNTER — Ambulatory Visit: Payer: Medicare Other | Attending: Internal Medicine

## 2019-08-23 DIAGNOSIS — Z23 Encounter for immunization: Secondary | ICD-10-CM | POA: Insufficient documentation

## 2019-08-23 NOTE — Progress Notes (Signed)
   Covid-19 Vaccination Clinic  Name:  Jerry Barr    MRN: [id number] DOB: [birth year]  08/23/2019  Jerry Barr was observed post Covid-19 immunization for 15 minutes without incidence. He was provided with Vaccine Information Sheet and instruction to access the V-Safe system.   Jerry Barr was instructed to call 911 with any severe reactions post vaccine: Marland Kitchen Difficulty breathing  . Swelling of your face and throat  . A fast heartbeat  . A bad rash all over your body  . Dizziness and weakness    Immunizations Administered    Name Date Dose VIS Date Route   Pfizer COVID-19 Vaccine 08/23/2019  9:27 AM 0.3 mL 06/22/2019 Intramuscular   Manufacturer: [employer]   Lot: [id number]   [place]: [id number]

## 2019-08-30 ENCOUNTER — Other Ambulatory Visit: Payer: Self-pay | Admitting: Physician Assistant

## 2019-10-02 DIAGNOSIS — M542 Cervicalgia: Secondary | ICD-10-CM | POA: Diagnosis not present

## 2019-10-02 DIAGNOSIS — I951 Orthostatic hypotension: Secondary | ICD-10-CM | POA: Diagnosis not present

## 2019-10-02 DIAGNOSIS — I700 Atherosclerosis of aorta: Secondary | ICD-10-CM | POA: Diagnosis not present

## 2019-10-02 DIAGNOSIS — I480 Paroxysmal atrial fibrillation: Secondary | ICD-10-CM | POA: Diagnosis not present

## 2019-10-02 DIAGNOSIS — H538 Other visual disturbances: Secondary | ICD-10-CM | POA: Diagnosis not present

## 2019-10-02 DIAGNOSIS — K5900 Constipation, unspecified: Secondary | ICD-10-CM | POA: Diagnosis not present

## 2019-10-02 DIAGNOSIS — N401 Enlarged prostate with lower urinary tract symptoms: Secondary | ICD-10-CM | POA: Diagnosis not present

## 2019-10-02 DIAGNOSIS — J309 Allergic rhinitis, unspecified: Secondary | ICD-10-CM | POA: Diagnosis not present

## 2019-10-02 DIAGNOSIS — G4752 REM sleep behavior disorder: Secondary | ICD-10-CM | POA: Diagnosis not present

## 2019-10-02 DIAGNOSIS — I2510 Atherosclerotic heart disease of native coronary artery without angina pectoris: Secondary | ICD-10-CM | POA: Diagnosis not present

## 2019-10-02 DIAGNOSIS — N529 Male erectile dysfunction, unspecified: Secondary | ICD-10-CM | POA: Diagnosis not present

## 2019-10-02 DIAGNOSIS — E7800 Pure hypercholesterolemia, unspecified: Secondary | ICD-10-CM | POA: Diagnosis not present

## 2019-10-15 DIAGNOSIS — H5203 Hypermetropia, bilateral: Secondary | ICD-10-CM | POA: Diagnosis not present

## 2019-10-15 DIAGNOSIS — H2513 Age-related nuclear cataract, bilateral: Secondary | ICD-10-CM | POA: Diagnosis not present

## 2019-10-23 DIAGNOSIS — R3912 Poor urinary stream: Secondary | ICD-10-CM | POA: Diagnosis not present

## 2019-10-23 DIAGNOSIS — R351 Nocturia: Secondary | ICD-10-CM | POA: Diagnosis not present

## 2019-11-02 ENCOUNTER — Telehealth: Payer: Self-pay

## 2019-11-02 ENCOUNTER — Telehealth (INDEPENDENT_AMBULATORY_CARE_PROVIDER_SITE_OTHER): Payer: Medicare Other | Admitting: Physician Assistant

## 2019-11-02 ENCOUNTER — Encounter: Payer: Self-pay | Admitting: Physician Assistant

## 2019-11-02 VITALS — BP 103/64 | HR 65 | Ht 72.0 in | Wt 148.0 lb

## 2019-11-02 DIAGNOSIS — Z7902 Long term (current) use of antithrombotics/antiplatelets: Secondary | ICD-10-CM | POA: Diagnosis not present

## 2019-11-02 DIAGNOSIS — Z7901 Long term (current) use of anticoagulants: Secondary | ICD-10-CM

## 2019-11-02 DIAGNOSIS — I4891 Unspecified atrial fibrillation: Secondary | ICD-10-CM

## 2019-11-02 DIAGNOSIS — N401 Enlarged prostate with lower urinary tract symptoms: Secondary | ICD-10-CM

## 2019-11-02 DIAGNOSIS — N400 Enlarged prostate without lower urinary tract symptoms: Secondary | ICD-10-CM | POA: Diagnosis not present

## 2019-11-02 DIAGNOSIS — E785 Hyperlipidemia, unspecified: Secondary | ICD-10-CM

## 2019-11-02 DIAGNOSIS — I480 Paroxysmal atrial fibrillation: Secondary | ICD-10-CM

## 2019-11-02 DIAGNOSIS — I2510 Atherosclerotic heart disease of native coronary artery without angina pectoris: Secondary | ICD-10-CM

## 2019-11-02 DIAGNOSIS — I951 Orthostatic hypotension: Secondary | ICD-10-CM

## 2019-11-02 DIAGNOSIS — Z955 Presence of coronary angioplasty implant and graft: Secondary | ICD-10-CM

## 2019-11-02 NOTE — Progress Notes (Signed)
Virtual Visit via Telephone Note   This visit type was conducted due to national recommendations for restrictions regarding the COVID-19 Pandemic (e.g. social distancing) in an effort to limit this patient's exposure and mitigate transmission in our community.  Due to his co-morbid illnesses, this patient is at least at moderate risk for complications without adequate follow up.  This format is felt to be most appropriate for this patient at this time.  The patient did not have access to video technology/had technical difficulties with video requiring transitioning to audio format only (telephone).  All issues noted in this document were discussed and addressed.  No physical exam could be performed with this format.  Please refer to the patient's chart for his  consent to telehealth for [facility].   The patient was identified using 2 identifiers.  Date:  11/02/2019   ID:  Jerry Barr, DOB Feb 19, 1941, MRN [id number]  Patient Location: Home Provider Location: Office  PCP:  Antony Contras, MD  Cardiologist:  Peter Martinique, MD  Electrophysiologist:  None   Evaluation Performed:  Follow-Up Visit  Chief Complaint:  followup  History of Present Illness:    Jerry Barr is a 79 y.o. male with  hx of hyperlipidemia, chronic hypotension, PAF on Xarelto, CAD and BPH.  Patient had a DES to LAD in 2011 in [place].  He was diagnosed with new onset of A. fib in October 2017.  Echocardiogram obtained around the time showed EF 50 to 55%, no regional wall motion abnormality, mild MR.  He underwent successful TEE DCCV on 04/23/2016.  He has been on midodrine for orthostatic hypotension and is unable to tolerate any rate control therapy for the same reason.  Myoview in December 2018 showed EF 55%, no significant ischemia or infarction.  Echocardiogram obtained in December 2018 showed EF 45 to 50%, mild diffuse hypokinesis, PA peak pressure 24 mmHg.  He did have recurrent A. fib in April  2020, and underwent cardioversion in the emergency room by Dr. Haroldine Laws.  Post cardioversion, amiodarone was increased to 200 mg twice daily.  He had cardiac catheterization in May 2020 that revealed 20% proximal RCA, 90% mid left circumflex lesion treated with a 2.25 x 16 mm Synergy DES, EF 55 to 65%.  Postprocedure, he was discharged on aspirin, Plavix and Xarelto with plan to stop aspirin after 1 month.  Due to recurrent symptoms, he underwent repeat cardiac catheterization in December 2020 and this showed nonobstructive disease with patent stents.  Patient presents today for cardiology virtual visit.  Last week, he had a bad week where his blood pressure was quite low even after taking midodrine and he was barely able to function.  This week, he is doing much better and able to function even without the midodrine in the morning.  He is not on any scheduled midodrine and only take the midodrine in the morning as needed for the drop in the blood pressure.  I discussed with the patient that in situations like last week, if 1 dose of midodrine does not work, he can take additional dose of Midrin to make up a total of 10 mg if needed to get his blood pressure up.  When his blood pressure is low, he sometimes he has neck pain and shoulder pain.  He is going to be seen by a neurologist for this.  I am not sure if the neck pain shoulder pain is related to hypoperfusion of the coronary vessel during hypotension or related to  cervical issues.  He is aware that starting on May 6 of this year, he can stop the Plavix and to continue on Xarelto.  His urologist who has been managing his BPH is considering medical and surgical treatment.  I recommend him to delay all surgical treatment until after he can come off of Plavix therapy.  As far as medication treatment, he is all right to use the finasteride, however I am not sure about nitric oxide or Cialis therapy as those are potent vasodilators and may defeat the purpose of  midodrine.  I will need to double check with Dr. Martinique.  The patient does not have symptoms concerning for COVID-19 infection (fever, chills, cough, or new shortness of breath).    Past Medical History:  Diagnosis Date  . Allergy   . BPH (benign prostatic hyperplasia)   . Cancer (HCC)    basal cell ca - face, nose and right leg  . Clotting disorder (HCC)    a fib hx - Xarelto  . Coronary artery disease    Promus drug-eluting stent to a 99% mid LAD, 40-50% narrowing in the distal LAD with mild to moderate disease in the circ and RCA b. 5/6 PCI/DESx1 to mLcx  . Dyslipidemia   . ED (erectile dysfunction)   . Hyperplastic colon polyp   . Hypoglycemia    Past Surgical History:  Procedure Laterality Date  . CARDIOVERSION N/A 04/23/2016   Procedure: CARDIOVERSION;  Surgeon: [name], MD;  Location: [facility] ENDOSCOPY;  Service: Cardiovascular;  Laterality: N/A;  . COLONOSCOPY    . CORONARY ANGIOPLASTY WITH STENT PLACEMENT    . CORONARY STENT INTERVENTION N/A 11/15/2018   Procedure: CORONARY STENT INTERVENTION;  Surgeon: Burnell Blanks, MD;  Location: [facility] CV LAB;  Service: Cardiovascular;  Laterality: N/A;  . LEFT HEART CATH AND CORONARY ANGIOGRAPHY N/A 11/15/2018   Procedure: LEFT HEART CATH AND CORONARY ANGIOGRAPHY;  Surgeon: Burnell Blanks, MD;  Location: [facility] LAB;  Service: Cardiovascular;  Laterality: N/A;  . LEFT HEART CATH AND CORONARY ANGIOGRAPHY N/A 06/25/2019   Procedure: LEFT HEART CATH AND CORONARY ANGIOGRAPHY;  Surgeon: Martinique, Peter M, MD;  Location: [facility] CV LAB;  Service: Cardiovascular;  Laterality: N/A;  . POLYPECTOMY    . TEE WITHOUT CARDIOVERSION N/A 04/23/2016   Procedure: TRANSESOPHAGEAL ECHOCARDIOGRAM (TEE);  Surgeon: [name], MD;  Location: [facility] ENDOSCOPY;  Service: Cardiovascular;  Laterality: N/A;  . UPPER GASTROINTESTINAL ENDOSCOPY       Current Meds  Medication Sig  . acetaminophen (TYLENOL) 500 MG tablet Take 500  mg by mouth every 6 (six) hours as needed for headache (pain).  Marland Kitchen amiodarone (PACERONE) 200 MG tablet Take 1 tablet (200 mg total) by mouth daily.  Marland Kitchen atorvastatin (LIPITOR) 20 MG tablet TAKE 1 TABLET BY MOUTH EVERY DAY  . Cholecalciferol (VITAMIN D-3) 1000 UNITS CAPS Take 1,000 Units by mouth daily.  . clopidogrel (PLAVIX) 75 MG tablet TAKE 1 TABLET BY MOUTH EVERY DAY  . famotidine (PEPCID) 20 MG tablet Take 1 tablet (20 mg total) by mouth 2 (two) times daily as needed for heartburn or indigestion.  . fluticasone (FLONASE) 50 MCG/ACT nasal spray Place 1 spray into both nostrils daily as needed (seasonal allergies).   . Ketotifen Fumarate (ALLERGY EYE DROPS OP) Place 1 drop into both eyes daily as needed (allergies).  . loratadine (CLARITIN) 10 MG tablet Take 10 mg by mouth as needed for allergies.   . Melatonin 3 MG TABS Take  6 mg by mouth at bedtime as needed (sleep aid).   . midodrine (PROAMATINE) 5 MG tablet Take 5 mg by mouth daily as needed (low blood pressure).   . rivaroxaban (XARELTO) 20 MG TABS tablet Take 1 tablet (20 mg total) by mouth daily with supper.  . Wheat Dextrin (BENEFIBER PO) Take 30 mLs by mouth daily as needed (Constipation).    Current Facility-Administered Medications for the 11/02/19 encounter (Telemedicine) with Almyra Deforest, PA  Medication  . sodium chloride flush (NS) 0.9 % injection 3 mL     Allergies:   Penicillins   Social History   Tobacco Use  . Smoking status: Never Smoker  . Smokeless tobacco: Never Used  Substance Use Topics  . Alcohol use: Yes    Alcohol/week: 3.0 standard drinks    Types: 3 Glasses of wine per week    Comment: red wine weekly  . Drug use: No     Family Hx: The patient's family history includes Bone cancer in his paternal grandfather; Breast cancer in his mother; CAD in his father; Cancer in his brother; Colon cancer in his brother and mother; Esophageal cancer in his brother; Heart attack in his paternal grandmother; Leukemia in  his paternal uncle; Liver cancer in his brother. There is no history of Pancreatic cancer, Prostate cancer, Rectal cancer, or Stomach cancer.  ROS:   Please see the history of present illness.     All other systems reviewed and are negative.   Prior CV studies:   The following studies were reviewed today:  Cath 11/15/2018  Prox RCA lesion is 20% stenosed.  Mid Cx lesion is 90% stenosed.  Previously placed Prox LAD to Mid LAD stent (unknown type) is widely patent.  A drug-eluting stent was successfully placed using a STENT SYNERGY DES 2.25X16.  Post intervention, there is a 0% residual stenosis.  The left ventricular systolic function is normal.  LV end diastolic pressure is normal.  The left ventricular ejection fraction is 55-65% by visual estimate.  There is no mitral valve regurgitation.   1. Patent mid LAD stent without restenosis 2. Severe stenosis mid Circumflex artery.  3. Successful PTCA/DES x 1 mid Circumflex 4. Mild non-obstructive disease in the RCA 5. Normal LV systolic function  Recommendations: ASA and Plavix for one month then stop ASA. Will restart Xarelto tomorrow. Would recommend at least six month of Plavix and Xarelto but could consider stopping Plavix at 6 months if necessary. Likely same day PCI discharge.    Cath 06/25/2019  Non-stenotic Prox LAD to Mid LAD lesion was previously treated.  Previously placed Mid Cx drug eluting stent is widely patent.  Balloon angioplasty was performed.  Prox RCA lesion is 20% stenosed.  The left ventricular systolic function is normal.  LV end diastolic pressure is normal.  The left ventricular ejection fraction is 55-65% by visual estimate.   1. Nonobstructive CAD- patent stents in LAD and LCx. 2. Normal LV function 3. Normal LVEDP  Plan: continue medical therapy.   Labs/Other Tests and Data Reviewed:    EKG:  An ECG dated 06/21/2019 was personally reviewed today and demonstrated:  Normal sinus  rhythm without significant ST-T wave changes.  Recent Labs: 03/23/2019: ALT 21; TSH 2.620 06/21/2019: BUN 25; Creatinine, Ser 1.20; Hemoglobin 14.0; Platelets 209; Potassium 5.3; Sodium 137   Recent Lipid Panel Lab Results  Component Value Date/Time   CHOL 120 03/23/2019 08:38 AM   TRIG 42 03/23/2019 08:38 AM   HDL 50 03/23/2019 08:38  AM   CHOLHDL 2.4 03/23/2019 08:38 AM   CHOLHDL 3.0 06/29/2016 10:11 AM   LDLCALC 59 03/23/2019 08:38 AM    Wt Readings from Last 3 Encounters:  11/02/19 148 lb (67.1 kg)  07/17/19 152 lb 6.4 oz (69.1 kg)  06/25/19 148 lb (67.1 kg)     Objective:    Vital Signs:  BP 103/64 (Patient Position: Standing)   Pulse 65   Ht 6' (1.829 m)   Wt 148 lb (67.1 kg)   BMI 20.07 kg/m    VITAL SIGNS:  reviewed  ASSESSMENT & PLAN:    1. Orthostatic hypotension: I recommended him to restart using a sliding scale midodrine.  He only needed to use the midodrine in the morning.  On some good week, he does not require any midodrine at all.  During other week, he may need either 5 mg or 10 mg midodrine dependent on his response to his initial 5 mg dose.  He is aware that he can take up to a total of 2 tablets in the morning if the first dose failed to work.  2. CAD: Last PCI was in May 2020.  Repeat cardiac catheterization in December 2020 showed nonobstructive disease. He may discontinue Plavix after May 6 of 2021  3. PAF: On amiodarone and Xarelto.  Unable to take any rate control medication due to history of orthostatic hypotension  4. Hyperlipidemia: On Lipitor  5. BPH: Seen by urologist who is planning for a either medical therapy versus surgical treatment.  I recommended him to delay any surgical treatment until at least after he can come off of Plavix.  If surgery is needed, his urologist will need to send [place] a cardiac clearance request so we can determine how long he will need to hold Xarelto prior to the procedure.  I think he is safe to start on finasteride,  however he also discussed possibility of nitric oxide or Cialis.  Both potent vasodilators, therefore I doubt entirely sure if they have a interaction with midodrine, will check with Dr. Martinique.  COVID-19 Education: The signs and symptoms of COVID-19 were discussed with the patient and how to seek care for testing (follow up with PCP or arrange E-visit).  The importance of social distancing was discussed today.  Time:   Today, I have spent 10 minutes with the patient with telehealth technology discussing the above problems.     Medication Adjustments/Labs and Tests Ordered: Current medicines are reviewed at length with the patient today.  Concerns regarding medicines are outlined above.   Tests Ordered: No orders of the defined types were placed in this encounter.   Medication Changes: No orders of the defined types were placed in this encounter.   Follow Up:  Either In Person or Virtual in 6 month(s)  Signed, Almyra Deforest, [place]  11/02/2019 10:53 AM    [place]

## 2019-11-02 NOTE — Telephone Encounter (Signed)
Called patient to discuss AVS instructions gave Hao Meng's recommendations and patient voiced understanding. AVS summary mailed to patient.    

## 2019-11-02 NOTE — Telephone Encounter (Signed)
  Patient Consent for Virtual Visit         Jerry Barr has provided verbal consent on 11/02/2019 for a virtual visit (video or telephone).   CONSENT FOR VIRTUAL VISIT FOR:  Jerry Barr  By participating in this virtual visit I agree to the following:  I hereby voluntarily request, consent and authorize [facility] and its employed or contracted physicians, physician assistants, nurse practitioners or other licensed health care professionals (the Practitioner), to provide me with telemedicine health care services (the "Services") as deemed necessary by the treating Practitioner. I acknowledge and consent to receive the Services by the Practitioner via telemedicine. I understand that the telemedicine visit will involve communicating with the Practitioner through live audiovisual communication technology and the disclosure of certain medical information by electronic transmission. I acknowledge that I have been given the opportunity to request an in-person assessment or other available alternative prior to the telemedicine visit and am voluntarily participating in the telemedicine visit.  I understand that I have the right to withhold or withdraw my consent to the use of telemedicine in the course of my care at any time, without affecting my right to future care or treatment, and that the Practitioner or I may terminate the telemedicine visit at any time. I understand that I have the right to inspect all information obtained and/or recorded in the course of the telemedicine visit and may receive copies of available information for a reasonable fee.  I understand that some of the potential risks of receiving the Services via telemedicine include:  Marland Kitchen Delay or interruption in medical evaluation due to technological equipment failure or disruption; . Information transmitted may not be sufficient (e.g. poor resolution of images) to allow for appropriate medical decision making by the Practitioner;  and/or  . In rare instances, security protocols could fail, causing a breach of personal health information.  Furthermore, I acknowledge that it is my responsibility to provide information about my medical history, conditions and care that is complete and accurate to the best of my ability. I acknowledge that Practitioner's advice, recommendations, and/or decision may be based on factors not within their control, such as incomplete or inaccurate data provided by me or distortions of diagnostic images or specimens that may result from electronic transmissions. I understand that the practice of medicine is not an exact science and that Practitioner makes no warranties or guarantees regarding treatment outcomes. I acknowledge that a copy of this consent can be made available to me via my patient portal ([employer]), or I can request a printed copy by calling the office of [place].    I understand that my insurance will be billed for this visit.   I have read or had this consent read to me. . I understand the contents of this consent, which adequately explains the benefits and risks of the Services being provided via telemedicine.  . I have been provided ample opportunity to ask questions regarding this consent and the Services and have had my questions answered to my satisfaction. . I give my informed consent for the services to be provided through the use of telemedicine in my medical care

## 2019-11-02 NOTE — Patient Instructions (Addendum)
Medication Instructions:   STOP Clopidogrel (Plavix) on 11/15/2019  *If you need a refill on your cardiac medications before your next appointment, please call your pharmacy*  Lab Work: NONE ordered at this time of appointment   If you have labs (blood work) drawn today and your tests are completely normal, you will receive your results only by: Marland Kitchen MyChart Message (if you have MyChart) OR . A paper copy in the mail If you have any lab test that is abnormal or we need to change your treatment, we will call you to review the results.  Testing/Procedures: NONE ordered at this time of appointment   Follow-Up: At [facility], you and your health needs are our priority.  As part of our continuing mission to provide you with exceptional heart care, we have created designated Provider Care Teams.  These Care Teams include your primary Cardiologist (physician) and Advanced Practice Providers (APPs -  Physician Assistants and Nurse Practitioners) who all work together to provide you with the care you need, when you need it.  Your next appointment:   4 month(s)  The format for your next appointment:   In Person  Provider:   Peter Martinique, MD  Other Instructions  Continue to monitor your blood pressure at home. If needed take a dose/tablet of Midodrine, if 1 dose/tablet does not help you may take an additional dose/tablet of Midodrine

## 2019-11-07 DIAGNOSIS — I4891 Unspecified atrial fibrillation: Secondary | ICD-10-CM | POA: Diagnosis not present

## 2019-11-07 DIAGNOSIS — I480 Paroxysmal atrial fibrillation: Secondary | ICD-10-CM | POA: Diagnosis not present

## 2019-11-07 DIAGNOSIS — I2510 Atherosclerotic heart disease of native coronary artery without angina pectoris: Secondary | ICD-10-CM | POA: Diagnosis not present

## 2019-11-07 DIAGNOSIS — N401 Enlarged prostate with lower urinary tract symptoms: Secondary | ICD-10-CM | POA: Diagnosis not present

## 2019-11-07 DIAGNOSIS — E7800 Pure hypercholesterolemia, unspecified: Secondary | ICD-10-CM | POA: Diagnosis not present

## 2019-11-07 DIAGNOSIS — E785 Hyperlipidemia, unspecified: Secondary | ICD-10-CM | POA: Diagnosis not present

## 2019-11-09 ENCOUNTER — Ambulatory Visit: Payer: Medicare Other | Admitting: Physician Assistant

## 2019-11-15 ENCOUNTER — Encounter (HOSPITAL_COMMUNITY): Payer: Self-pay | Admitting: Physician Assistant

## 2019-11-15 ENCOUNTER — Ambulatory Visit (HOSPITAL_COMMUNITY)
Admission: RE | Admit: 2019-11-15 | Discharge: 2019-11-15 | Disposition: A | Discharge status: Home / Self Care | Payer: Medicare Other | Source: Ambulatory Visit | Attending: Physician Assistant | Admitting: Physician Assistant

## 2019-11-15 ENCOUNTER — Other Ambulatory Visit: Payer: Self-pay

## 2019-11-15 VITALS — BP 120/82 | HR 65 | Ht 72.0 in | Wt 153.6 lb

## 2019-11-15 DIAGNOSIS — I951 Orthostatic hypotension: Secondary | ICD-10-CM | POA: Diagnosis not present

## 2019-11-15 DIAGNOSIS — Z79899 Other long term (current) drug therapy: Secondary | ICD-10-CM | POA: Insufficient documentation

## 2019-11-15 DIAGNOSIS — D6869 Other thrombophilia: Secondary | ICD-10-CM

## 2019-11-15 DIAGNOSIS — I2510 Atherosclerotic heart disease of native coronary artery without angina pectoris: Secondary | ICD-10-CM | POA: Diagnosis not present

## 2019-11-15 DIAGNOSIS — I480 Paroxysmal atrial fibrillation: Secondary | ICD-10-CM | POA: Insufficient documentation

## 2019-11-15 DIAGNOSIS — E785 Hyperlipidemia, unspecified: Secondary | ICD-10-CM | POA: Diagnosis not present

## 2019-11-15 LAB — COMPREHENSIVE METABOLIC PANEL
ALT: 20 U/L (ref 0–44)
AST: 18 U/L (ref 15–41)
Albumin: 3.7 g/dL (ref 3.5–5.0)
Alkaline Phosphatase: 72 U/L (ref 38–126)
Anion gap: 5 (ref 5–15)
BUN: 23 mg/dL (ref 8–23)
CO2: 26 mmol/L (ref 22–32)
Calcium: 8.7 mg/dL — ABNORMAL LOW (ref 8.9–10.3)
Chloride: 106 mmol/L (ref 98–111)
Creatinine, Ser: 1.24 mg/dL (ref 0.61–1.24)
GFR calc Af Amer: >60 mL/min (ref >60)
GFR calc non Af Amer: 55 mL/min — ABNORMAL LOW (ref >60)
Glucose, Bld: 94 mg/dL (ref 70–99)
Potassium: 5.6 mmol/L — ABNORMAL HIGH (ref 3.5–5.1)
Sodium: 137 mmol/L (ref 135–145)
Total Bilirubin: 0.7 mg/dL (ref 0.3–1.2)
Total Protein: 6.5 g/dL (ref 6.5–8.1)

## 2019-11-15 LAB — TSH: TSH: 1.793 u[IU]/mL (ref 0.350–4.500)

## 2019-11-15 NOTE — Patient Instructions (Signed)
Scheduling will be in touch regarding appointment with Dr. Caryl Comes

## 2019-11-15 NOTE — Progress Notes (Signed)
Primary Care Physician: Antony Contras, MD Primary Cardiologist: Dr Martinique Primary Electrophysiologist: none Referring Physician: [facility]   Jerry Barr is a 79 y.o. male with a history of paroxysmal atrial fibrillation, CAD, BPH, HLD, and chronic hypotension who presents for follow up in the [facility]. He is on Xarelto for a CHADS2VASC score of 3. Patient seen by Dr Martinique 06/20/19 with symptoms of exertional neck and shoulder pain similar to his previous anginal symptoms. LHC 06/25/19 showed nonobstructive disease with patent stents. Patient reports that he has episodes of heart racing associated with his neck and shoulder pain but this has not been typical of his afib symptoms in the past. He states he can go weeks with no symptoms at all. His symptoms resolve with rest. There does not appears to be any other triggers.  Today, he denies symptoms of shortness of breath, orthopnea, PND, lower extremity edema, syncope, snoring, daytime somnolence, bleeding, or neurologic sequela. The patient is tolerating medications without difficulties and is otherwise without complaint today.    Atrial Fibrillation Risk Factors:  he does not have symptoms or diagnosis of sleep apnea. he does not have a history of alcohol use. The patient does not have a history of early familial atrial fibrillation or other arrhythmias.  he has a BMI of Body mass index is 20.83 kg/m.Marland Kitchen Filed Weights   11/15/19 1332  Weight: 69.7 kg    Family History  Problem Relation Age of Onset  . Breast cancer Mother   . Colon cancer Mother   . CAD Father   . Heart attack Paternal Grandmother   . Colon cancer Brother   . Esophageal cancer Brother   . Liver cancer Brother   . Cancer Brother   . Leukemia Paternal Uncle   . Bone cancer Paternal Grandfather   . Pancreatic cancer Neg Hx   . Prostate cancer Neg Hx   . Rectal cancer Neg Hx   . Stomach cancer Neg Hx      Atrial  Fibrillation Management history:  Previous antiarrhythmic drugs: amiodarone  Previous cardioversions: 2017, 10/15/18 Previous ablations: none CHADS2VASC score: 3 Anticoagulation history: Xarelto    Past Medical History:  Diagnosis Date  . Allergy   . BPH (benign prostatic hyperplasia)   . Cancer (HCC)    basal cell ca - face, nose and right leg  . Clotting disorder (HCC)    a fib hx - Xarelto  . Coronary artery disease    Promus drug-eluting stent to a 99% mid LAD, 40-50% narrowing in the distal LAD with mild to moderate disease in the circ and RCA b. 5/6 PCI/DESx1 to mLcx  . Dyslipidemia   . ED (erectile dysfunction)   . Hyperplastic colon polyp   . Hypoglycemia    Past Surgical History:  Procedure Laterality Date  . CARDIOVERSION N/A 04/23/2016   Procedure: CARDIOVERSION;  Surgeon: [name], MD;  Location: [facility] ENDOSCOPY;  Service: Cardiovascular;  Laterality: N/A;  . COLONOSCOPY    . CORONARY ANGIOPLASTY WITH STENT PLACEMENT    . CORONARY STENT INTERVENTION N/A 11/15/2018   Procedure: CORONARY STENT INTERVENTION;  Surgeon: Burnell Blanks, MD;  Location: [facility];  Service: Cardiovascular;  Laterality: N/A;  . LEFT HEART CATH AND CORONARY ANGIOGRAPHY N/A 11/15/2018   Procedure: LEFT HEART CATH AND CORONARY ANGIOGRAPHY;  Surgeon: Burnell Blanks, MD;  Location: [place] [facility];  Service: Cardiovascular;  Laterality: N/A;  . LEFT HEART CATH AND  CORONARY ANGIOGRAPHY N/A 06/25/2019   Procedure: LEFT HEART CATH AND CORONARY ANGIOGRAPHY;  Surgeon: Martinique, Peter M, MD;  Location: [facility];  Service: Cardiovascular;  Laterality: N/A;  . POLYPECTOMY    . TEE WITHOUT CARDIOVERSION N/A 04/23/2016   Procedure: TRANSESOPHAGEAL ECHOCARDIOGRAM (TEE);  Surgeon: [name], MD;  Location: [facility] ENDOSCOPY;  Service: Cardiovascular;  Laterality: N/A;  . UPPER GASTROINTESTINAL ENDOSCOPY      Current Outpatient Medications  Medication Sig Dispense Refill   . acetaminophen (TYLENOL) 500 MG tablet Take 500 mg by mouth every 6 (six) hours as needed for headache (pain).    Marland Kitchen amiodarone (PACERONE) 200 MG tablet Take 1 tablet (200 mg total) by mouth daily. 90 tablet 1  . atorvastatin (LIPITOR) 20 MG tablet TAKE 1 TABLET BY MOUTH EVERY DAY 90 tablet 3  . Cholecalciferol (VITAMIN D-3) 1000 UNITS CAPS Take 1,000 Units by mouth daily.    . famotidine (PEPCID) 20 MG tablet Take 1 tablet (20 mg total) by mouth 2 (two) times daily as needed for heartburn or indigestion.    . fluticasone (FLONASE) 50 MCG/ACT nasal spray Place 1 spray into both nostrils daily as needed (seasonal allergies).     . Ketotifen Fumarate (ALLERGY EYE DROPS OP) Place 1 drop into both eyes daily as needed (allergies).    . loratadine (CLARITIN) 10 MG tablet Take 10 mg by mouth as needed for allergies.     . Melatonin 3 MG TABS Take 6 mg by mouth at bedtime as needed (sleep aid).     . midodrine (PROAMATINE) 5 MG tablet Take 5 mg by mouth daily as needed (low blood pressure).     . rivaroxaban (XARELTO) 20 MG TABS tablet Take 1 tablet (20 mg total) by mouth daily with supper.    . Wheat Dextrin (BENEFIBER PO) Take 30 mLs by mouth daily as needed (Constipation).      Current Facility-Administered Medications  Medication Dose Route Frequency Provider Last Rate Last Admin  . sodium chloride flush (NS) 0.9 % injection 3 mL  3 mL Intravenous Q12H Martinique, Peter M, MD        Allergies  Allergen Reactions  . Penicillins Rash    Did it involve swelling of the face/tongue/throat, SOB, or low BP? No Did it involve sudden or severe rash/hives, skin peeling, or any reaction on the inside of your mouth or nose? No Did you need to seek medical attention at a hospital or doctor's office? No When did it last happen?50+ years  If all above answers are "NO", may proceed with cephalosporin use.     Social History   Socioeconomic History  . Marital status: Married    Spouse name: Not on  file  . Number of children: Not on file  . Years of education: Not on file  . Highest education level: Not on file  Occupational History  . Not on file  Tobacco Use  . Smoking status: Never Smoker  . Smokeless tobacco: Never Used  Substance and Sexual Activity  . Alcohol use: Yes    Alcohol/week: 3.0 standard drinks    Types: 3 Glasses of wine per week    Comment: red wine weekly  . Drug use: No  . Sexual activity: Not on file  Other Topics Concern  . Not on file  Social History Narrative  . Not on file   Social Determinants of Health   Financial Resource Strain:   . Difficulty of Paying Living Expenses:  Food Insecurity:   . Worried About Charity fundraiser in the Last Year:   . Arboriculturist in the Last Year:   Transportation Needs:   . Film/video editor (Medical):   Marland Kitchen Lack of Transportation (Non-Medical):   Physical Activity:   . Days of Exercise per Week:   . Minutes of Exercise per Session:   Stress:   . Feeling of Stress :   Social Connections:   . Frequency of Communication with Friends and Family:   . Frequency of Social Gatherings with Friends and Family:   . Attends Religious Services:   . Active Member of Clubs or Organizations:   . Attends Archivist Meetings:   Marland Kitchen Marital Status:   Intimate Partner Violence:   . Fear of Current or Ex-Partner:   . Emotionally Abused:   Marland Kitchen Physically Abused:   . Sexually Abused:      ROS- All systems are reviewed and negative except as per the HPI above.  Physical Exam: Vitals:   11/15/19 1332  BP: 120/82  Pulse: 65  Weight: 69.7 kg  Height: 6' (1.829 m)    GEN- The patient is well appearing elderly male, alert and oriented x 3 today.   HEENT-head normocephalic, atraumatic, sclera clear, conjunctiva pink, hearing intact, trachea midline. Lungs- Clear to ausculation bilaterally, normal work of breathing Heart- Regular rate and rhythm, no murmurs, rubs or gallops  GI- soft, NT, ND, +  BS Extremities- no clubbing, cyanosis, or edema MS- no significant deformity or atrophy Skin- no rash or lesion Psych- euthymic mood, full affect Neuro- strength and sensation are intact   Wt Readings from Last 3 Encounters:  11/15/19 69.7 kg  11/02/19 67.1 kg  07/17/19 69.1 kg    EKG today demonstrates SR HR 65, PR 192, QRS 94, QTc 455  Echo 06/13/17 demonstrated  - Left ventricle: The cavity size was normal. Systolic function was   mildly reduced. The estimated ejection fraction was in the range   of 45% to 50%. Mild diffuse hypokinesis. Left ventricular   diastolic function parameters were normal. - Aortic valve: Transvalvular velocity was within the normal range.   There was no stenosis. There was trivial regurgitation. - Mitral valve: There was trivial regurgitation. - Left atrium: The atrium was moderately dilated. - Right ventricle: The cavity size was normal. Wall thickness was   normal. Systolic function was normal. - Right atrium: The atrium was severely dilated. - Atrial septum: No defect or patent foramen ovale was identified   by color flow Doppler. - Tricuspid valve: There was trivial regurgitation. - Pulmonary arteries: Systolic pressure was within the normal   range. PA peak pressure: 24 mm Hg (S).  Epic records are reviewed at length today  Assessment and Plan:  1. Paroxysmal atrial fibrillation Patient appears to be maintaining SR but he does have occasional heart racing. This is different from his afib symptoms in the past. We discussed cardiac monitoring today. Given the erratic nature of his symptoms, we discussed ILR and he is agreeable. Will refer to EP. Continue amiodarone 200 mg daily.  Continue Xarelto 20 mg daily Check cmet/TSH today.  This patients CHA2DS2-VASc Score and unadjusted Ischemic Stroke Rate (% per year) is equal to 3.2 % stroke rate/year from a score of 3  Above score calculated as 1 point each if present [CHF, HTN, DM,  Vascular=MI/PAD/Aortic Plaque, Age if 65-74, or Male] Above score calculated as 2 points each if present [Age >  51, or Stroke/TIA/TE]  2. CAD S/p DES to LAD 2011 and mid cirfumflex 11/2018. Repeat LHC 06/25/19 showed nonobstructive disease and patent stents. Followed by Dr Martinique.  3. Orthostatic Hypotension This has been a chronic problem for him. He has midodrine PRN. BP stable today.   Follow up with Dr Martinique as scheduled. Will refer to EP, his wife is established with Dr Caryl Comes.    [facility] [address] [place], [place] [address] [phone] 11/15/2019 2:04 PM

## 2019-11-16 ENCOUNTER — Other Ambulatory Visit: Payer: Self-pay | Admitting: Cardiology

## 2019-11-16 NOTE — Telephone Encounter (Signed)
CrCl est with Scr=1.2 is 5m/min CrCl est with Scr = 1.24 is 483mmin  wt slightly increase No s/sx of bleeding noted  Will continue current xarelto dose and repeat MET in 6 months

## 2019-11-16 NOTE — Telephone Encounter (Signed)
24m 69.1kg Scr 1.24(11/15/19) ccr 48 Lovw/fenton 11/15/19 Pt requesting a refill for xarelto 20mg , but based on dosing guidelines, the pt only qualifies for 15mg . Will route to the pharmd pool for further review.

## 2019-11-19 ENCOUNTER — Encounter: Payer: Self-pay | Admitting: *Deleted

## 2019-11-20 ENCOUNTER — Other Ambulatory Visit: Payer: Self-pay

## 2019-11-20 ENCOUNTER — Ambulatory Visit (INDEPENDENT_AMBULATORY_CARE_PROVIDER_SITE_OTHER): Payer: Medicare Other | Admitting: Diagnostic Neuroimaging

## 2019-11-20 ENCOUNTER — Encounter: Payer: Self-pay | Admitting: Diagnostic Neuroimaging

## 2019-11-20 ENCOUNTER — Telehealth: Payer: Self-pay | Admitting: Diagnostic Neuroimaging

## 2019-11-20 VITALS — BP 104/65 | HR 69 | Temp 97.1°F | Ht 72.0 in | Wt 152.4 lb

## 2019-11-20 DIAGNOSIS — I951 Orthostatic hypotension: Secondary | ICD-10-CM | POA: Diagnosis not present

## 2019-11-20 DIAGNOSIS — I25110 Atherosclerotic heart disease of native coronary artery with unstable angina pectoris: Secondary | ICD-10-CM

## 2019-11-20 DIAGNOSIS — Z79899 Other long term (current) drug therapy: Secondary | ICD-10-CM

## 2019-11-20 DIAGNOSIS — M542 Cervicalgia: Secondary | ICD-10-CM | POA: Diagnosis not present

## 2019-11-20 DIAGNOSIS — R6889 Other general symptoms and signs: Secondary | ICD-10-CM | POA: Diagnosis not present

## 2019-11-20 NOTE — Patient Instructions (Signed)
orthostatic hypotension (? autonomic dysfunction / neuropathy, neurodegenerative MSA-a, cardiogenic) - check neuropathy workup - check MRI brain - consider repeat echocardiogram; follow up with cardiology and Dr. Caryl Comes - continue midodrine, hydration, compression stockings

## 2019-11-20 NOTE — Telephone Encounter (Signed)
Medicare/aarp order sent to GI. No auth they will reach out to the patient to schedule.  

## 2019-11-20 NOTE — Progress Notes (Signed)
[facility]  PATIENT: Jerry Barr DOB: Jan 30, 1941  REFERRING CLINICIAN: Antony Contras, MD HISTORY FROM: patient  REASON FOR VISIT: new consult    HISTORICAL  CHIEF COMPLAINT:  Chief Complaint  Patient presents with  . Autonomic dysfunction    rm 7 New Pt    HISTORY OF PRESENT ILLNESS:   79 year old male here for evaluation of orthostatic hypotension.  Around 2015 patient had episodes of lightheadedness and fainting after standing up.  Patient has been under care of cardiology for coronary artery disease status post stenting as well as atrial fibrillation.  Due to orthostatic hypotension and syncope he has been started on midodrine, increasing fluid intake, drinking salt water solution, using compression stockings.  Syncope events have stopped happening.  However patient still feels intermittent lightheadedness and orthostatic intolerance.  When patient wakes up his sitting systolic blood pressure ranges 100-120s; heart rate 60s.  When he stands up systolic blood pressures can drop to the 80s but heart rate does not change that much initially.  After he stands up for a few minutes his heart rate may go up to the 110s.  Some days he feels enough lightheadedness dizziness that he has to sit down or lay down.  Some days he is not able to go for 45-minute walks or be that active because of symptoms.  However other days and weeks he is very active able to walk several miles, and tolerate exercise without difficulty.  Patient has history of REM behavior to sleep disorder diagnosed in 2011.  No memory issues, gait or balance difficulties.     REVIEW OF SYSTEMS: Full 14 system review of systems performed and negative with exception of: As per HPI.  ALLERGIES: Allergies  Allergen Reactions  . Penicillins Rash    Did it involve swelling of the face/tongue/throat, SOB, or low BP? No Did it involve sudden or severe rash/hives, skin peeling, or any reaction on the  inside of your mouth or nose? No Did you need to seek medical attention at a hospital or doctor's office? No When did it last happen?50+ years  If all above answers are "NO", may proceed with cephalosporin use.     HOME MEDICATIONS: Outpatient Medications Prior to Visit  Medication Sig Dispense Refill  . acetaminophen (TYLENOL) 500 MG tablet Take 500 mg by mouth every 6 (six) hours as needed for headache (pain).    Marland Kitchen amiodarone (PACERONE) 200 MG tablet Take 1 tablet (200 mg total) by mouth daily. 90 tablet 1  . atorvastatin (LIPITOR) 20 MG tablet TAKE 1 TABLET BY MOUTH EVERY DAY 90 tablet 3  . Cholecalciferol (VITAMIN D-3) 1000 UNITS CAPS Take 1,000 Units by mouth daily.    . famotidine (PEPCID) 20 MG tablet Take 1 tablet (20 mg total) by mouth 2 (two) times daily as needed for heartburn or indigestion.    . fluticasone (FLONASE) 50 MCG/ACT nasal spray Place 1 spray into both nostrils daily as needed (seasonal allergies).     . Ketotifen Fumarate (ALLERGY EYE DROPS OP) Place 1 drop into both eyes daily as needed (allergies).    . loratadine (CLARITIN) 10 MG tablet Take 10 mg by mouth as needed for allergies.     . Melatonin 3 MG TABS Take 6 mg by mouth at bedtime as needed (sleep aid).     . midodrine (PROAMATINE) 5 MG tablet Take 5 mg by mouth daily as needed (low blood pressure).     . Wheat Dextrin (BENEFIBER PO) Take 30  mLs by mouth daily as needed (Constipation).     Alveda Reasons 20 MG TABS tablet TAKE 1 TABLET (20 MG TOTAL) BY MOUTH DAILY WITH SUPPER. 90 tablet 1   Facility-Administered Medications Prior to Visit  Medication Dose Route Frequency Provider Last Rate Last Admin  . sodium chloride flush (NS) 0.9 % injection 3 mL  3 mL Intravenous Q12H Martinique, Peter M, MD        PAST MEDICAL HISTORY: Past Medical History:  Diagnosis Date  . Allergy   . Aortic atherosclerosis ([place])   . Atrial fibrillation ([place])   . BPH (benign prostatic hyperplasia)   . CAD (coronary artery  disease)   . Cancer (HCC)    basal cell ca - face, nose and right leg  . Clotting disorder (HCC)    a fib hx - Xarelto  . Coronary artery disease    Promus drug-eluting stent to a 99% mid LAD, 40-50% narrowing in the distal LAD with mild to moderate disease in the circ and RCA b. 5/6 PCI/DESx1 to mLcx  . Dyslipidemia   . ED (erectile dysfunction)   . Hearing loss   . Hyperplastic colon polyp   . Hypoglycemia   . Orthostatic hypotension   . REM sleep behavior disorder    not tested    PAST SURGICAL HISTORY: Past Surgical History:  Procedure Laterality Date  . CARDIOVERSION N/A 04/23/2016   Procedure: CARDIOVERSION;  Surgeon: [name], MD;  Location: [facility] ENDOSCOPY;  Service: Cardiovascular;  Laterality: N/A;  . COLONOSCOPY    . CORONARY ANGIOPLASTY WITH STENT PLACEMENT    . CORONARY STENT INTERVENTION N/A 11/15/2018   Procedure: CORONARY STENT INTERVENTION;  Surgeon: Burnell Blanks, MD;  Location: [facility] LAB;  Service: Cardiovascular;  Laterality: N/A;  . LEFT HEART CATH AND CORONARY ANGIOGRAPHY N/A 11/15/2018   Procedure: LEFT HEART CATH AND CORONARY ANGIOGRAPHY;  Surgeon: Burnell Blanks, MD;  Location: [place] [facility];  Service: Cardiovascular;  Laterality: N/A;  . LEFT HEART CATH AND CORONARY ANGIOGRAPHY N/A 06/25/2019   Procedure: LEFT HEART CATH AND CORONARY ANGIOGRAPHY;  Surgeon: Martinique, Peter M, MD;  Location: [place];  Service: Cardiovascular;  Laterality: N/A;  . POLYPECTOMY    . TEE WITHOUT CARDIOVERSION N/A 04/23/2016   Procedure: TRANSESOPHAGEAL ECHOCARDIOGRAM (TEE);  Surgeon: [name], MD;  Location[facility] ENDOSCOPY;  Service: Cardiovascular;  Laterality: N/A;  . UPPER GASTROINTESTINAL ENDOSCOPY      FAMILY HISTORY: Family History  Problem Relation Age of Onset  . Breast cancer Mother   . Colon cancer Mother   . CAD Father   . Heart attack Paternal Grandmother   . Colon cancer Brother   . Esophageal cancer Brother   .  Liver cancer Brother   . Cancer Brother   . Leukemia Paternal Uncle   . Bone cancer Paternal Grandfather   . Pancreatic cancer Neg Hx   . Prostate cancer Neg Hx   . Rectal cancer Neg Hx   . Stomach cancer Neg Hx     SOCIAL HISTORY: Social History   Socioeconomic History  . Marital status: Married    Spouse name: Sheria Lang  . Number of children: Not on file  . Years of education: Not on file  . Highest education level: Professional school degree (e.g., MD, DDS, DVM, JD)  Occupational History    Comment: retired  CE  Tobacco Use  . Smoking status: Never Smoker  . Smokeless tobacco: Never Used  Substance and Sexual Activity  . Alcohol use: Yes    Alcohol/week: 3.0 standard drinks    Types: 3 Glasses of wine per week    Comment: red wine weekly  . Drug use: No  . Sexual activity: Not on file  Other Topics Concern  . Not on file  Social History Narrative   Lives with wife   Almost no caffeine   Social Determinants of Health   Financial Resource Strain:   . Difficulty of Paying Living Expenses:   Food Insecurity:   . Worried About Charity fundraiser in the Last Year:   . Arboriculturist in the Last Year:   Transportation Needs:   . Film/video editor (Medical):   Marland Kitchen Lack of Transportation (Non-Medical):   Physical Activity:   . Days of Exercise per Week:   . Minutes of Exercise per Session:   Stress:   . Feeling of Stress :   Social Connections:   . Frequency of Communication with Friends and Family:   . Frequency of Social Gatherings with Friends and Family:   . Attends Religious Services:   . Active Member of Clubs or Organizations:   . Attends Archivist Meetings:   Marland Kitchen Marital Status:   Intimate Partner Violence:   . Fear of Current or Ex-Partner:   . Emotionally Abused:   Marland Kitchen Physically Abused:   . Sexually Abused:      PHYSICAL EXAM  GENERAL EXAM/CONSTITUTIONAL: Vitals:  Vitals:   11/20/19 1402 11/20/19 1407  BP: 126/75 (sitting)  104/65 (standing)  Pulse: 64 69  Temp: (!) 97.1 F (36.2 C)   Weight: 152 lb 6.4 oz (69.1 kg)   Height: 6' (1.829 m)   No data found.      Body mass index is 20.67 kg/m. Wt Readings from Last 3 Encounters:  11/20/19 152 lb 6.4 oz (69.1 kg)  11/15/19 153 lb 9.6 oz (69.7 kg)  11/02/19 148 lb (67.1 kg)     Patient is in no distress; well developed, nourished and groomed; neck is supple  CARDIOVASCULAR:  Examination of carotid arteries is normal; no carotid bruits  Regular rate and rhythm, no murmurs  Examination of peripheral vascular system by observation and palpation is normal  EYES:  Ophthalmoscopic exam of optic discs and posterior segments is normal; no papilledema or hemorrhages  No exam data present  MUSCULOSKELETAL:  Gait, strength, tone, movements noted in Neurologic exam below  NEUROLOGIC: MENTAL STATUS:  No flowsheet data found.  awake, alert, oriented to person, place and time  recent and remote memory intact  normal attention and concentration  language fluent, comprehension intact, naming intact  fund of knowledge appropriate  CRANIAL NERVE:   2nd - no papilledema on fundoscopic exam  2nd, 3rd, 4th, 6th - pupils equal and SLOW reactive to light, visual fields full to confrontation, extraocular muscles intact, no nystagmus  5th - facial sensation symmetric  7th - facial strength symmetric  8th - hearing intact  9th - palate elevates symmetrically, uvula midline  11th - shoulder shrug symmetric  12th - tongue protrusion midline  MOTOR:   normal bulk and tone, full strength in the BUE, BLE  INTENTION TREMOR; MILD BRADYKINESIA IN BUE  SENSORY:   normal and symmetric to light touch, temperature, vibration  COORDINATION:   finger-nose-finger, fine finger movements normal  REFLEXES:   deep tendon reflexes TRACE and symmetric  GAIT/STATION:   narrow based gait     DIAGNOSTIC DATA (  LABS, IMAGING, TESTING) - I  reviewed patient records, labs, notes, testing and imaging myself where available.  Lab Results  Component Value Date   WBC 6.8 06/21/2019   HGB 14.0 06/21/2019   HCT 43.2 06/21/2019   MCV 95.4 06/21/2019   PLT 209 06/21/2019      Component Value Date/Time   NA 137 11/15/2019 1326   NA 136 06/20/2019 1425   K 5.6 (H) 11/15/2019 1326   CL 106 11/15/2019 1326   CO2 26 11/15/2019 1326   GLUCOSE 94 11/15/2019 1326   BUN 23 11/15/2019 1326   BUN 27 06/20/2019 1425   CREATININE 1.24 11/15/2019 1326   CREATININE 1.19 (H) 12/08/2016 1000   CALCIUM 8.7 (L) 11/15/2019 1326   PROT 6.5 11/15/2019 1326   PROT 6.3 03/23/2019 0838   ALBUMIN 3.7 11/15/2019 1326   ALBUMIN 4.0 03/23/2019 0838   AST 18 11/15/2019 1326   ALT 20 11/15/2019 1326   ALKPHOS 72 11/15/2019 1326   BILITOT 0.7 11/15/2019 1326   BILITOT 0.5 03/23/2019 0838   GFRNONAA 55 (L) 11/15/2019 1326   GFRAA >60 11/15/2019 1326   Lab Results  Component Value Date   CHOL 120 03/23/2019   HDL 50 03/23/2019   LDLCALC 59 03/23/2019   TRIG 42 03/23/2019   CHOLHDL 2.4 03/23/2019   Lab Results  Component Value Date   HGBA1C 5.2 06/12/2017   No results found for: [id number] Lab Results  Component Value Date   TSH 1.793 11/15/2019     06/13/17 TTE - Left ventricle: The cavity size was normal. Systolic function was  mildly reduced. The estimated ejection fraction was in the range  of 45% to 50%. Mild diffuse hypokinesis. Left ventricular  diastolic function parameters were normal.  - Aortic valve: Transvalvular velocity was within the normal range.  There was no stenosis. There was trivial regurgitation.  - Mitral valve: There was trivial regurgitation.  - Left atrium: The atrium was moderately dilated.  - Right ventricle: The cavity size was normal. Wall thickness was  normal. Systolic function was normal.  - Right atrium: The atrium was severely dilated.  - Atrial septum: No defect or patent foramen ovale  was identified  by color flow Doppler.  - Tricuspid valve: There was trivial regurgitation.  - Pulmonary arteries: Systolic pressure was within the normal  range. PA peak pressure: 24 mm Hg (S).     ASSESSMENT AND PLAN  79 y.o. year old male here with orthostatic lightheadedness and orthostatic hypotension since 2015, slightly improved with midodrine and other general measures.  We will proceed with further work-up to rule out CNS or PNS etiologies of autonomic dysfunction/neuropathy.  Dx:  1. Orthostatic hypotension   2. Other general symptoms and signs   3. Long-term current use of high risk medication other than anticoagulant     PLAN:  orthostatic hypotension (? autonomic dysfunction / neuropathy, neurodegenerative MSA-a, cardiogenic) - check neuropathy workup - check MRI brain, cervical  - consider repeat echocardiogram; follow up with cardiology and Dr. Caryl Comes - continue midodrine, hydration, compression stockings  Orders Placed This Encounter  Procedures  . MR BRAIN W WO CONTRAST  . MR CERVICAL SPINE W WO CONTRAST  . CBC with diff  . CMP  . Vitamin B12  . A1c  . TSH  . SPEP with IFE  . ANA w/Reflex  . SSA, SSB   Return for pending if symptoms worsen or fail to improve.    Penni Bombard, MD 11/20/2019,  [id number] PM Certified in Neurology, Neurophysiology and [facility] [address], [place], [place] [address] [phone]

## 2019-11-21 ENCOUNTER — Other Ambulatory Visit: Payer: Self-pay | Admitting: Cardiology

## 2019-11-23 LAB — CBC WITH DIFFERENTIAL/PLATELET
Basophils Absolute: 0 10*3/uL (ref 0.0–0.2)
Basos: 0 %
EOS (ABSOLUTE): 0.1 10*3/uL (ref 0.0–0.4)
Eos: 1 %
Hematocrit: 42 % (ref 37.5–51.0)
Hemoglobin: 13.9 g/dL (ref 13.0–17.7)
Immature Grans (Abs): 0 10*3/uL (ref 0.0–0.1)
Immature Granulocytes: 0 %
Lymphocytes Absolute: 1.3 10*3/uL (ref 0.7–3.1)
Lymphs: 17 %
MCH: 30.7 pg (ref 26.6–33.0)
MCHC: 33.1 g/dL (ref 31.5–35.7)
MCV: 93 fL (ref 79–97)
Monocytes Absolute: 0.6 10*3/uL (ref 0.1–0.9)
Monocytes: 8 %
Neutrophils Absolute: 5.4 10*3/uL (ref 1.4–7.0)
Neutrophils: 74 %
Platelets: 222 10*3/uL (ref 150–450)
RBC: 4.53 x10E6/uL (ref 4.14–5.80)
RDW: 13.3 % (ref 11.6–15.4)
WBC: 7.3 10*3/uL (ref 3.4–10.8)

## 2019-11-23 LAB — TSH: TSH: 1.19 u[IU]/mL (ref 0.450–4.500)

## 2019-11-23 LAB — HEMOGLOBIN A1C
Est. average glucose Bld gHb Est-mCnc: 105 mg/dL
Hgb A1c MFr Bld: 5.3 % (ref 4.8–5.6)

## 2019-11-23 LAB — COMPREHENSIVE METABOLIC PANEL
ALT: 20 IU/L (ref 0–44)
AST: 19 IU/L (ref 0–40)
Albumin/Globulin Ratio: 1.8 (ref 1.2–2.2)
Albumin: 4.2 g/dL (ref 3.7–4.7)
Alkaline Phosphatase: 80 IU/L (ref 39–117)
BUN/Creatinine Ratio: 17 (ref 10–24)
BUN: 22 mg/dL (ref 8–27)
Bilirubin Total: 0.4 mg/dL (ref 0.0–1.2)
CO2: 26 mmol/L (ref 20–29)
Calcium: 8.7 mg/dL (ref 8.6–10.2)
Chloride: 104 mmol/L (ref 96–106)
Creatinine, Ser: 1.26 mg/dL (ref 0.76–1.27)
GFR calc Af Amer: 63 mL/min/{1.73_m2} (ref >59)
GFR calc non Af Amer: 54 mL/min/{1.73_m2} — ABNORMAL LOW (ref >59)
Globulin, Total: 2.3 g/dL (ref 1.5–4.5)
Glucose: 92 mg/dL (ref 65–99)
Potassium: 4.6 mmol/L (ref 3.5–5.2)
Sodium: 139 mmol/L (ref 134–144)
Total Protein: 6.5 g/dL (ref 6.0–8.5)

## 2019-11-23 LAB — MULTIPLE MYELOMA PANEL, SERUM
Albumin SerPl Elph-Mcnc: 3.8 g/dL (ref 2.9–4.4)
Albumin/Glob SerPl: 1.5 (ref 0.7–1.7)
Alpha 1: 0.2 g/dL (ref 0.0–0.4)
Alpha2 Glob SerPl Elph-Mcnc: 0.6 g/dL (ref 0.4–1.0)
B-Globulin SerPl Elph-Mcnc: 0.9 g/dL (ref 0.7–1.3)
Gamma Glob SerPl Elph-Mcnc: 1.1 g/dL (ref 0.4–1.8)
Globulin, Total: 2.7 g/dL (ref 2.2–3.9)
IgA/Immunoglobulin A, Serum: 289 mg/dL (ref 61–437)
IgG (Immunoglobin G), Serum: 1040 mg/dL (ref 603–1613)
IgM (Immunoglobulin M), Srm: 49 mg/dL (ref 15–143)

## 2019-11-23 LAB — SJOGREN'S SYNDROME ANTIBODS(SSA + SSB)
ENA SSA (RO) Ab: <0.2 AI (ref 0.0–0.9)
ENA SSB (LA) Ab: <0.2 AI (ref 0.0–0.9)

## 2019-11-23 LAB — VITAMIN B12: Vitamin B-12: 582 pg/mL (ref 232–1245)

## 2019-11-23 LAB — ANA W/REFLEX: ANA Titer 1: NEGATIVE

## 2019-11-26 ENCOUNTER — Encounter: Payer: Self-pay | Admitting: Internal Medicine

## 2019-11-26 ENCOUNTER — Ambulatory Visit (INDEPENDENT_AMBULATORY_CARE_PROVIDER_SITE_OTHER): Payer: Medicare Other | Admitting: Internal Medicine

## 2019-11-26 ENCOUNTER — Other Ambulatory Visit: Payer: Self-pay

## 2019-11-26 ENCOUNTER — Encounter: Payer: Self-pay | Admitting: *Deleted

## 2019-11-26 VITALS — BP 138/80 | HR 62 | Ht 72.0 in | Wt 153.0 lb

## 2019-11-26 DIAGNOSIS — I480 Paroxysmal atrial fibrillation: Secondary | ICD-10-CM

## 2019-11-26 DIAGNOSIS — I951 Orthostatic hypotension: Secondary | ICD-10-CM | POA: Diagnosis not present

## 2019-11-26 NOTE — Progress Notes (Signed)
ELECTROPHYSIOLOGY CONSULT NOTE  Patient ID: Jerry Barr, MRN: [id number], DOB/AGE: [birth year] 79 y.o. Admit date: (Not on file) Date of Consult: 11/26/2019  Primary Physician: Antony Contras, MD Primary Cardiologist: Dilynn Rommel is a 79 y.o. male who is being seen today for the evaluation of palpitaions  at the request of RFenton PA.    HPI Jerry Barr is a 79 y.o. male with a history of orthostatic hypotension dating back many years treated with ProAmatine.  These episodes mostly are notable in the morning.  He awakens he has tremulous.  This is aggravated by showers in the morning.  He also has problems if he stays around the kitchen too long making his breakfast.  Symptoms are much less prominent as the day goes on.  He has significant shower intolerance and has had falls or near falls in the shower.  He has heat intolerance with aggravated lightheadedness.  As noted above, symptoms are much worse in the morning.  He sleeps flat in bed.  He shares a bed with his wife.  He has been wearing compression thigh-high stockings.  Also notes symptoms in his shoulders with discomfort upon standing.  It relieved with sitting.  He has problems with constipation and some dry mouth.  Treated with with ProAmatine  Atrial fibrillation treated with amiodarone and rivaroxaban; cardioversion undertaken 2018 in 2020.  Not able to tolerate higher doses of amiodarone.  History of remote coronary artery disease with stenting LAD 2011 circumflex 2020  DATE TEST EF   10/17 Echo   50-55% %   12/18 Echo  45-50%   12/20 LHC  LAD Stents patent  Cx-stent patent; RCA patent   Date Cr K TSH LFTs Hgb  5/21 1.26 4.6 1.19 20 13.9           Thromboembolic risk factors ( age -48, HTN-1, Vasc disease -1) for a CHADSVASc Score of >=4  Much to my chagrin he was wearing a Aeronautical engineer a clemsonundershirt and then he tells me that he has his undergraduate degree and chemistry PhD from from  [employer]  Past Medical History:  Diagnosis Date  . Allergy   . Aortic atherosclerosis ([place])   . Atrial fibrillation ([place])   . BPH (benign prostatic hyperplasia)   . CAD (coronary artery disease)   . Cancer (HCC)    basal cell ca - face, nose and right leg  . Clotting disorder (HCC)    a fib hx - Xarelto  . Coronary artery disease    Promus drug-eluting stent to a 99% mid LAD, 40-50% narrowing in the distal LAD with mild to moderate disease in the circ and RCA b. 5/6 PCI/DESx1 to mLcx  . Dyslipidemia   . ED (erectile dysfunction)   . Hearing loss   . Hyperplastic colon polyp   . Hypoglycemia   . Orthostatic hypotension   . REM sleep behavior disorder    not tested      Surgical History:  Past Surgical History:  Procedure Laterality Date  . CARDIOVERSION N/A 04/23/2016   Procedure: CARDIOVERSION;  Surgeon: [name], MD;  Location: [facility] ENDOSCOPY;  Service: Cardiovascular;  Laterality: N/A;  . COLONOSCOPY    . CORONARY ANGIOPLASTY WITH STENT PLACEMENT    . CORONARY STENT INTERVENTION N/A 11/15/2018   Procedure: CORONARY STENT INTERVENTION;  Surgeon: Burnell Blanks, MD;  Location: [place] CV LAB;  Service: Cardiovascular;  Laterality: N/A;  . LEFT HEART CATH AND  CORONARY ANGIOGRAPHY N/A 11/15/2018   Procedure: LEFT HEART CATH AND CORONARY ANGIOGRAPHY;  Surgeon: Burnell Blanks, MD;  Location: [place] CV LAB;  Service: Cardiovascular;  Laterality: N/A;  . LEFT HEART CATH AND CORONARY ANGIOGRAPHY N/A 06/25/2019   Procedure: LEFT HEART CATH AND CORONARY ANGIOGRAPHY;  Surgeon: Martinique, Peter M, MD;  Location: [place] CV LAB;  Service: Cardiovascular;  Laterality: N/A;  . POLYPECTOMY    . TEE WITHOUT CARDIOVERSION N/A 04/23/2016   Procedure: TRANSESOPHAGEAL ECHOCARDIOGRAM (TEE);  Surgeon: [name], MD;  Location: [facility] ENDOSCOPY;  Service: Cardiovascular;  Laterality: N/A;  . UPPER GASTROINTESTINAL ENDOSCOPY       Home Meds: Current Meds  Medication  Sig  . acetaminophen (TYLENOL) 500 MG tablet Take 500 mg by mouth every 6 (six) hours as needed for headache (pain).  Marland Kitchen amiodarone (PACERONE) 200 MG tablet TAKE 1 TABLET BY MOUTH EVERY DAY  . atorvastatin (LIPITOR) 20 MG tablet TAKE 1 TABLET BY MOUTH EVERY DAY  . Cholecalciferol (VITAMIN D-3) 1000 UNITS CAPS Take 1,000 Units by mouth daily.  . famotidine (PEPCID) 20 MG tablet Take 1 tablet (20 mg total) by mouth 2 (two) times daily as needed for heartburn or indigestion.  . fluticasone (FLONASE) 50 MCG/ACT nasal spray Place 1 spray into both nostrils daily as needed (seasonal allergies).   . Ketotifen Fumarate (ALLERGY EYE DROPS OP) Place 1 drop into both eyes daily as needed (allergies).  . loratadine (CLARITIN) 10 MG tablet Take 10 mg by mouth as needed for allergies.   . Melatonin 3 MG TABS Take 6 mg by mouth at bedtime as needed (sleep aid).   . midodrine (PROAMATINE) 5 MG tablet Take 5 mg by mouth daily as needed (low blood pressure).   . Wheat Dextrin (BENEFIBER PO) Take 30 mLs by mouth daily as needed (Constipation).   Alveda Reasons 20 MG TABS tablet TAKE 1 TABLET (20 MG TOTAL) BY MOUTH DAILY WITH SUPPER.   Current Facility-Administered Medications for the 11/26/19 encounter (Office Visit) with Deboraha Sprang, MD  Medication  . sodium chloride flush (NS) 0.9 % injection 3 mL    Allergies:  Allergies  Allergen Reactions  . Penicillins Rash    Did it involve swelling of the face/tongue/throat, SOB, or low BP? No Did it involve sudden or severe rash/hives, skin peeling, or any reaction on the inside of your mouth or nose? No Did you need to seek medical attention at a hospital or doctor's office? No When did it last happen?50+ years  If all above answers are "NO", may proceed with cephalosporin use.     Social History   Socioeconomic History  . Marital status: Married    Spouse name: Sheria Lang  . Number of children: Not on file  . Years of education: Not on file  . Highest  education level: Professional school degree (e.g., MD, DDS, DVM, JD)  Occupational History    Comment: retired  CE  Tobacco Use  . Smoking status: Never Smoker  . Smokeless tobacco: Never Used  Substance and Sexual Activity  . Alcohol use: Yes    Alcohol/week: 3.0 standard drinks    Types: 3 Glasses of wine per week    Comment: red wine weekly  . Drug use: No  . Sexual activity: Not on file  Other Topics Concern  . Not on file  Social History Narrative   Lives with wife   Almost no caffeine   Social Determinants of Health   Financial  Resource Strain:   . Difficulty of Paying Living Expenses:   Food Insecurity:   . Worried About Charity fundraiser in the Last Year:   . Arboriculturist in the Last Year:   Transportation Needs:   . Film/video editor (Medical):   Marland Kitchen Lack of Transportation (Non-Medical):   Physical Activity:   . Days of Exercise per Week:   . Minutes of Exercise per Session:   Stress:   . Feeling of Stress :   Social Connections:   . Frequency of Communication with Friends and Family:   . Frequency of Social Gatherings with Friends and Family:   . Attends Religious Services:   . Active Member of Clubs or Organizations:   . Attends Archivist Meetings:   Marland Kitchen Marital Status:   Intimate Partner Violence:   . Fear of Current or Ex-Partner:   . Emotionally Abused:   Marland Kitchen Physically Abused:   . Sexually Abused:      Family History  Problem Relation Age of Onset  . Breast cancer Mother   . Colon cancer Mother   . CAD Father   . Heart attack Paternal Grandmother   . Colon cancer Brother   . Esophageal cancer Brother   . Liver cancer Brother   . Cancer Brother   . Leukemia Paternal Uncle   . Bone cancer Paternal Grandfather   . Pancreatic cancer Neg Hx   . Prostate cancer Neg Hx   . Rectal cancer Neg Hx   . Stomach cancer Neg Hx      ROS:  Please see the history of present illness.     All other systems reviewed and negative.     Physical Exam:  Blood pressure 138/80, pulse 62, height 6' (1.829 m), weight 153 lb (69.4 kg), SpO2 97 %. General: Well developed, well nourished male in no acute distress. Head: Normocephalic, atraumatic, sclera non-icteric, no xanthomas, nares are without discharge. EENT: normal  Lymph Nodes:  none Neck: Negative for carotid bruits. JVD not elevated. Back:without scoliosis kyphosis  Lungs: Clear bilaterally to auscultation without wheezes, rales, or rhonchi. Breathing is unlabored. Heart: RRR with S1 S2. 2/6 m murmur . No rubs, or gallops appreciated. Abdomen: Soft, non-tender, non-distended with normoactive bowel sounds. No hepatomegaly. No rebound/guarding. No obvious abdominal masses. Msk:  Strength and tone appear normal for age. Extremities: No clubbing or cyanosis. No edema.  Distal pedal pulses are 2+ and equal bilaterally. Skin: Warm and Dry Neuro: Alert and oriented X 3. CN III-XII intact Grossly normal sensory and motor function . Psych:  Responds to questions appropriately with a normal affect.      Labs: Cardiac Enzymes No results for input(s): CKTOTAL, CKMB, TROPONINI in the last 72 hours. CBC Lab Results  Component Value Date   WBC 7.3 11/20/2019   HGB 13.9 11/20/2019   HCT 42.0 11/20/2019   MCV 93 11/20/2019   PLT 222 11/20/2019   PROTIME: No results for input(s): LABPROT, INR in the last 72 hours. Chemistry  Recent Labs  Lab 11/20/19 1506  NA 139  K 4.6  CL 104  CO2 26  BUN 22  CREATININE 1.26  CALCIUM 8.7  PROT 6.5  BILITOT 0.4  ALKPHOS 80  ALT 20  AST 19  GLUCOSE 92   Lipids Lab Results  Component Value Date   CHOL 120 03/23/2019   HDL 50 03/23/2019   LDLCALC 59 03/23/2019   TRIG 42 03/23/2019   BNP No results  found for: PROBNP Thyroid Function Tests: No results for input(s): TSH, T4TOTAL, T3FREE, THYROIDAB in the last 72 hours.  Invalid input(s): FREET3 Miscellaneous Lab Results  Component Value Date   DDIMER 0.52 (H)  04/20/2016    Radiology/Studies:  No results found.  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  This is supported by his history of constipation and dry mouth.  This tends to be a progressive issue.  He is currently being further evaluated by neurology.  I would be disinclined to use alpha blockers for his prostatism.  I am also inclined to discontinue his amiodarone knowing that he can aggravate orthostatic intolerance; he has tolerated it poorly.  His episodes of atrial fibrillation have been relatively infrequent requiring cardioversion on just 2 occasions 2 years apart.  Intermittent cardioversion might be a better strategy than using amiodarone.  We discussed the physiology of orthostatic intolerance including gravitational fluid shifts and the impact of hypertensive vascular disease on orthostasis and treatment options.  We discussed pharmacological options including midodrine and its physiology, pyridostigmine, fludrocortisone.  We discussed nonpharmacological options including raising the HOB, isometric contraction upon standing, abdominal binders  thigh sleeves. We emphasized the importance of recognizing the prodrome and sitting prior to falling, safety in the shower and in the bathroom and the avoidance of dehydration   We will begin with a non pharmacological approach with abdominal binders and thigh sleeves.  We further discussed raising the head of his bed 4-6 inches.  Hydrating prior to standing.  Using a shower chair and showering may be later in the day.  This lack of heart rate response with  standing begs the issue as to what is causing his heart rate of 120-130.  It would be less likely to be sinus tachycardia as a consequence of his orthostatic hypotension, and his possibly an arrhythmia.  Given the frequency of the events, we will not use a implantable loop recorder but a 30-day monitor.  Virl Axe

## 2019-11-26 NOTE — Patient Instructions (Addendum)
Medication Instructions:  Your physician has recommended you make the following change in your medication:   ** Stop taking your Amiodarone   *If you need a refill on your cardiac medications before your next appointment, please call your pharmacy*   Lab Work: None ordered.  If you have labs (blood work) drawn today and your tests are completely normal, you will receive your results only by: Marland Kitchen MyChart Message (if you have MyChart) OR . A paper copy in the mail If you have any lab test that is abnormal or we need to change your treatment, we will call you to review the results.   Testing/Procedures: None ordered.    Follow-Up: At [facility], you and your health needs are our priority.  As part of our continuing mission to provide you with exceptional heart care, we have created designated Provider Care Teams.  These Care Teams include your primary Cardiologist (physician) and Advanced Practice Providers (APPs -  Physician Assistants and Nurse Practitioners) who all work together to provide you with the care you need, when you need it.  We recommend signing up for the patient portal called "MyChart".  Sign up information is provided on this After Visit Summary.  MyChart is used to connect with patients for Virtual Visits (Telemedicine).  Patients are able to view lab/test results, encounter notes, upcoming appointments, etc.  Non-urgent messages can be sent to your provider as well.   To learn more about what you can do with MyChart, go to [email or web address].    Your next appointment:   01/25/2020 at 930am  The format for your next appointment:   In Person  Provider:   Virl Axe, MD   Other Instructions Abdominal Binder and Thigh Sleeves as discussed by Dr Brett Albino the head of the bed

## 2019-11-27 ENCOUNTER — Encounter: Payer: Self-pay | Admitting: Internal Medicine

## 2019-11-27 ENCOUNTER — Telehealth: Payer: Self-pay | Admitting: Internal Medicine

## 2019-11-27 ENCOUNTER — Telehealth: Payer: Self-pay | Admitting: Cardiology

## 2019-11-27 NOTE — Telephone Encounter (Signed)
I think stopping amiodarone is reasonable given Dr Olin Pia reasons. Cialis is preferable to an alpha blocker like Flomax but there is a small risk of hypotension < 2%. He could try it but if it worsens his orthostatic symptoms then I would stop.  Akon Reinoso Martinique MD, [facility]

## 2019-11-27 NOTE — Telephone Encounter (Signed)
   Pt said Dr. Caryl Comes ordered heart monitor for him to wear for 30 days, he said he has MRI on 12/22/19. Should he wait to wear his heart monitor after MRI or its ok to wear it once he received it and ok to have it during his MRI

## 2019-11-27 NOTE — Telephone Encounter (Signed)
   Pt c/o medication issue:  1. Name of Medication: Amiodarone, Cialis  2. How are you currently taking this medication (dosage and times per day)?   3. Are you having a reaction (difficulty breathing--STAT)?   4. What is your medication issue? Pt said he just wanted to make sure Dr. Martinique is aware that Dr. Caryl Comes stopped his amiodarone, also, he saw his urologist from his benign enlarge prostate. He said he has an option to take cialis or surgery. He would like to know Dr. Martinique recommendation.

## 2019-11-27 NOTE — Telephone Encounter (Signed)
Error

## 2019-11-28 NOTE — Telephone Encounter (Signed)
Patient called to check on status of his call yesterday.

## 2019-11-28 NOTE — Telephone Encounter (Signed)
LMVM-  Patient can apply [employer] cardiac event monitor now.  When he has his MRI on 12/22/2019, he will need to remove his monitor.  To do this, press pause on the monitor cell phone.  It will ask, are you sure, select yes.  Remove the monitor/ strip and proceed with MRI.  After MRI, remove monitor battery from old strip and insert into new strip.  Apply strip to chest.  On monitor phone, select Resume.  A screen will pop up with 2 pictures.  Touch the picture which shows where you placed your monitor strip on your chest.

## 2019-11-28 NOTE — Telephone Encounter (Signed)
Received call back from patient Dr.Jordan's advice given.

## 2019-11-28 NOTE — Telephone Encounter (Signed)
Returned call to patient no answer.LMTC. 

## 2019-11-29 ENCOUNTER — Other Ambulatory Visit: Payer: Self-pay

## 2019-11-29 ENCOUNTER — Ambulatory Visit (HOSPITAL_COMMUNITY)
Admission: RE | Admit: 2019-11-29 | Discharge: 2019-11-29 | Disposition: A | Discharge status: Home / Self Care | Payer: Medicare Other | Source: Ambulatory Visit | Attending: Nurse Practitioner | Admitting: Nurse Practitioner

## 2019-11-29 DIAGNOSIS — I4819 Other persistent atrial fibrillation: Secondary | ICD-10-CM | POA: Insufficient documentation

## 2019-11-29 LAB — BASIC METABOLIC PANEL
Anion gap: 7 (ref 5–15)
BUN: 25 mg/dL — ABNORMAL HIGH (ref 8–23)
CO2: 25 mmol/L (ref 22–32)
Calcium: 8.5 mg/dL — ABNORMAL LOW (ref 8.9–10.3)
Chloride: 106 mmol/L (ref 98–111)
Creatinine, Ser: 1.37 mg/dL — ABNORMAL HIGH (ref 0.61–1.24)
GFR calc Af Amer: 57 mL/min — ABNORMAL LOW (ref >60)
GFR calc non Af Amer: 49 mL/min — ABNORMAL LOW (ref >60)
Glucose, Bld: 93 mg/dL (ref 70–99)
Potassium: 4.3 mmol/L (ref 3.5–5.1)
Sodium: 138 mmol/L (ref 135–145)

## 2019-11-30 ENCOUNTER — Encounter (HOSPITAL_COMMUNITY): Payer: Self-pay

## 2019-12-01 ENCOUNTER — Ambulatory Visit (INDEPENDENT_AMBULATORY_CARE_PROVIDER_SITE_OTHER): Payer: Medicare Other

## 2019-12-01 ENCOUNTER — Encounter: Payer: Self-pay | Admitting: Internal Medicine

## 2019-12-01 DIAGNOSIS — I480 Paroxysmal atrial fibrillation: Secondary | ICD-10-CM | POA: Diagnosis not present

## 2019-12-01 DIAGNOSIS — I951 Orthostatic hypotension: Secondary | ICD-10-CM

## 2019-12-03 ENCOUNTER — Telehealth: Payer: Self-pay | Admitting: Internal Medicine

## 2019-12-03 DIAGNOSIS — I4891 Unspecified atrial fibrillation: Secondary | ICD-10-CM

## 2019-12-03 NOTE — Telephone Encounter (Signed)
Call from [facility] monitoring - atrial fibrillation noted this evening, rate 110 bpm.

## 2019-12-04 ENCOUNTER — Encounter: Payer: Self-pay | Admitting: Internal Medicine

## 2019-12-04 NOTE — Telephone Encounter (Signed)
[employer] faxed over strips regarding this noted event.  Monitor revealed critical report day 3 on this pt. Report showed that on 5/24 at 7:06 pm CST, pt was in afib RVR sustained with a rate of 110 bpm. This was an auto-triggered event. Called the pt to inquire what he was doing around this time, and what his symptoms were.  Pt states he was out for his evening walk, and the only symptoms he had at that time was complaints of bilateral neck and shoulder pain.  He voiced no complaints of chest pain, sob, doe, palpitations, dizziness, lightheadedness, weakness, N/V, pre-syncopal or syncopal episodes.  Pt states he is currently asymptomatic at this time.    Pt states he is taking all his cardiac medications as prescribed with no skipped doses.  Pt does have a standing history of afib and treated with Xarelto.  He was on amiodarone at one time, but was unable to tolerate higher doses of this medication. Amiodarone was discontinued at his  5/17 OV with Dr. Caryl Comes. Dr. 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  This is supported by his history of constipation and dry mouth.  This tends to be a progressive issue.  He is currently being further evaluated by neurology.  I would be disinclined to use alpha blockers for his prostatism.  I am also inclined to discontinue his amiodarone knowing that he can aggravate orthostatic intolerance; he has tolerated it poorly.  His episodes of atrial fibrillation have been relatively infrequent requiring cardioversion on just 2 occasions 2 years apart.  Intermittent cardioversion might be a better strategy than using amiodarone.  We discussed the physiology of orthostatic intolerance including gravitational fluid shifts and the impact of hypertensive vascular disease on orthostasis and treatment options.   We discussed pharmacological options including midodrine and its physiology, pyridostigmine, fludrocortisone.  We discussed nonpharmacological options including raising the HOB, isometric contraction upon standing, abdominal binders  thigh sleeves. We emphasized the importance of recognizing the prodrome and sitting prior to falling, safety in the shower and in the bathroom and the avoidance of dehydration   We will begin with a non pharmacological approach with abdominal binders and thigh sleeves.  We further discussed raising the head of his bed 4-6 inches.  Hydrating prior to standing.  Using a shower chair and showering may be later in the day.  This lack of heart rate response with standing begs the issue as to what is causing his heart rate of 120-130.  It would be less likely to be sinus tachycardia as a consequence of his orthostatic hypotension, and his possibly an arrhythmia.  Given the frequency of the events, we will not use a implantable loop recorder but a 30-day monitor. Virl Axe   Informed the pt that Dr. Caryl Comes will be in the office this afternoon, and I will have his Primary Covering Nurse Rosann Auerbach show him this recording for further interpretation and advisement.  Informed the pt that Rosann Auerbach RN will follow-up with him shortly thereafter, with Dr. Olin Pia recommendations.  Pt verbalized understanding and agrees with this plan. Will route this message to Dr. Caryl Comes and Rosann Auerbach RN for their review and follow-up.

## 2019-12-04 NOTE — Telephone Encounter (Signed)
Attempted phone call to pt.  Left voicemail message to contact RN at [phone]. 

## 2019-12-04 NOTE — Telephone Encounter (Signed)
Spoke with pt who states he had no symptoms at time event was reported.  Pt reports neck and back pain around 8pm while taking a 19 min walk, however this was not a new pain that he experienced.  Pt advised per Dr Caryl Comes he would like for pt to have an echocardiogram to look at pt's atrial size.  Pt is agreeable to this.  Orders placed for echo.  Pt verbalizes understanding and thanked Therapist, sports for call.

## 2019-12-04 NOTE — Progress Notes (Unsigned)
The patient had tachycardia.  We will need to clarify whether there are associated palpitations.  Event recorder demonstrated atrial fibrillation.  Duration not yet clear.  This would seem to imply that the episodes of symptomatic atrial fibrillation requiring cardioversion may indeed be infrequent but the recurring of atrial fibrillation may be much less so.  This then begs the issue of treatment options for his atrial fibrillation.  Consider catheter ablation.  His echo 2018 demonstrated severe RAE and moderate LAE.  We will repeat an echo to look at this.  We will plan when he comes into get an amiodarone level to see whether he might be a candidate for dofetilide.

## 2019-12-04 NOTE — Addendum Note (Signed)
Addended by: Thora Lance on: 12/04/2019 03:13 PM   Modules accepted: Orders

## 2019-12-07 DIAGNOSIS — R3912 Poor urinary stream: Secondary | ICD-10-CM | POA: Diagnosis not present

## 2019-12-07 DIAGNOSIS — R351 Nocturia: Secondary | ICD-10-CM | POA: Diagnosis not present

## 2019-12-11 ENCOUNTER — Telehealth: Payer: Self-pay | Admitting: Cardiology

## 2019-12-11 NOTE — Telephone Encounter (Signed)
It looks like his BP has been well controlled. There is caution if BP is severely elevated. Otherwise he can start and just monitor BP at home. Let us know if there is a significant increase.   Roma Bondar Martinique MD, [facility]

## 2019-12-11 NOTE — Telephone Encounter (Signed)
Pt c/o medication issue:  1. Name of Medication: Mirabegron 25mg   2. How are you currently taking this medication (dosage and times per day)? n/a  3. Are you having a reaction (difficulty breathing--STAT)? no  4. What is your medication issue? Patient's urologist has prescribed the patient this medication for overactive bladder. The primary side effect is it could raise BP. He states his urologist wants him to start out taking it 1x daily then up to 2x daily if it does not help. Patient wants to know if he should take this medication. Please advise.

## 2019-12-11 NOTE — Telephone Encounter (Signed)
Spoke to patient he stated urologist has prescribed Mirabegron for frequency.Stated a side effect is high B/P.He wanted to ask Dr.Jordan if ok to take.Advised I will send message to [place] for advice.

## 2019-12-11 NOTE — Telephone Encounter (Signed)
Spoke to patient Dr.Jordan's advice given.Stated he will start taking and monitor B/P.He will call back if B/P elevated.

## 2019-12-13 ENCOUNTER — Telehealth: Payer: Self-pay | Admitting: Internal Medicine

## 2019-12-13 NOTE — Telephone Encounter (Signed)
Myrbetriq is used for overactive bladder and there should be no interaction between Myrbetriq and his other current medications on his list.  He should take the medication whole with water.  Most common side effects are increased blood pressure, dry mouth, nasopharyngitis, and diarrhea.  I would recommend patient monitor his blood pressure at home and report back if BP increases.

## 2019-12-13 NOTE — Telephone Encounter (Signed)
°  Patient is calling because his urologist recommended a medication for him to try but he needs to know if it is safe for him to take. The medication is Myrbetriq and he will hold off starting it until he hears from Dr Caryl Comes due to the side effects. Please advise.

## 2019-12-14 ENCOUNTER — Other Ambulatory Visit: Payer: Self-pay

## 2019-12-14 ENCOUNTER — Ambulatory Visit (HOSPITAL_COMMUNITY): Payer: Medicare Other | Attending: Cardiology

## 2019-12-14 DIAGNOSIS — I4891 Unspecified atrial fibrillation: Secondary | ICD-10-CM | POA: Diagnosis not present

## 2019-12-14 NOTE — Telephone Encounter (Signed)
Spoke with pt and advised per Pharm-D there should be no interaction between Myrbetriq and his other current medications on his list.  He should take the medication whole with water.  Most common side effects are increased blood pressure, dry mouth, nasopharyngitis and diarrhea.  Recommended that pt monitor his blood pressure at home and report to urologist if he has an increase of more than 10 points systolic or diastolic from his normal.  Pt verbalizes understanding and agrees with current plan.

## 2019-12-21 ENCOUNTER — Encounter: Payer: Self-pay | Admitting: Internal Medicine

## 2019-12-21 NOTE — Progress Notes (Signed)
Echo shows surprisingly normal atrial size which makes catheter ablation a possible undertaking  Lets arrange a telehealth visit/OV to think about atrial fibrillation treatment options  Thanks SK

## 2019-12-22 ENCOUNTER — Other Ambulatory Visit: Payer: Self-pay

## 2019-12-22 ENCOUNTER — Ambulatory Visit
Admission: RE | Admit: 2019-12-22 | Discharge: 2019-12-22 | Disposition: A | Discharge status: Home / Self Care | Payer: Medicare Other | Source: Ambulatory Visit | Attending: Diagnostic Neuroimaging | Admitting: Diagnostic Neuroimaging

## 2019-12-22 DIAGNOSIS — R6889 Other general symptoms and signs: Secondary | ICD-10-CM

## 2019-12-22 DIAGNOSIS — Z79899 Other long term (current) drug therapy: Secondary | ICD-10-CM

## 2019-12-22 DIAGNOSIS — M542 Cervicalgia: Secondary | ICD-10-CM | POA: Diagnosis not present

## 2019-12-22 DIAGNOSIS — I951 Orthostatic hypotension: Secondary | ICD-10-CM

## 2019-12-22 MED ORDER — GADOBENATE DIMEGLUMINE 529 MG/ML IV SOLN
15.0000 mL | Freq: Once | INTRAVENOUS | Status: AC | PRN
  Administered 2019-12-22: 15 mL via INTRAVENOUS

## 2019-12-25 DIAGNOSIS — G4752 REM sleep behavior disorder: Secondary | ICD-10-CM | POA: Diagnosis not present

## 2019-12-26 ENCOUNTER — Telehealth: Payer: Self-pay | Admitting: Diagnostic Neuroimaging

## 2019-12-26 NOTE — Telephone Encounter (Signed)
Patient called stating he had his MRI on 12/22/19. He would like to know what the results are. He stated the results are on his Mychart but he wants a call to know what his results are.

## 2019-12-26 NOTE — Telephone Encounter (Addendum)
Called patient and informed him the MRI brain results are unremarkable. The MRI cervical spine showed some degenerative changes; no major cord compression. Advised he continue current plan which we reviewed per MD's note. Patient verbalized understanding, appreciation. Marland Kitchen

## 2019-12-28 ENCOUNTER — Other Ambulatory Visit: Payer: Self-pay | Admitting: Cardiology

## 2019-12-31 ENCOUNTER — Institutional Professional Consult (permissible substitution): Payer: Medicare Other | Admitting: Internal Medicine

## 2020-01-01 NOTE — Progress Notes (Signed)
Appointment scheduled for 01/17/2020 to discuss echo and ablation.

## 2020-01-17 ENCOUNTER — Other Ambulatory Visit: Payer: Self-pay

## 2020-01-17 ENCOUNTER — Telehealth: Payer: Self-pay

## 2020-01-17 ENCOUNTER — Telehealth (INDEPENDENT_AMBULATORY_CARE_PROVIDER_SITE_OTHER): Payer: Medicare Other | Admitting: Internal Medicine

## 2020-01-17 VITALS — BP 112/68 | HR 74 | Wt 149.0 lb

## 2020-01-17 DIAGNOSIS — I951 Orthostatic hypotension: Secondary | ICD-10-CM

## 2020-01-17 DIAGNOSIS — I25110 Atherosclerotic heart disease of native coronary artery with unstable angina pectoris: Secondary | ICD-10-CM

## 2020-01-17 DIAGNOSIS — I480 Paroxysmal atrial fibrillation: Secondary | ICD-10-CM

## 2020-01-17 MED ORDER — METOPROLOL TARTRATE 25 MG PO TABS
12.5000 mg | ORAL_TABLET | Freq: Two times a day (BID) | ORAL | 3 refills | Status: DC
Start: 2020-01-17 — End: 2020-02-04

## 2020-01-17 NOTE — Patient Instructions (Signed)
Medication Instructions:  Your physician has recommended you make the following change in your medication:   Begin Metoprolol 25mg  - 1/2 tablet by mouth twice daily.  *If you need a refill on your cardiac medications before your next appointment, please call your pharmacy*   Lab Work: None ordered.  If you have labs (blood work) drawn today and your tests are completely normal, you will receive your results only by: Marland Kitchen MyChart Message (if you have MyChart) OR . A paper copy in the mail If you have any lab test that is abnormal or we need to change your treatment, we will call you to review the results.   Testing/Procedures: None ordered.    Follow-Up: At [facility], you and your health needs are our priority.  As part of our continuing mission to provide you with exceptional heart care, we have created designated Provider Care Teams.  These Care Teams include your primary Cardiologist (physician) and Advanced Practice Providers (APPs -  Physician Assistants and Nurse Practitioners) who all work together to provide you with the care you need, when you need it.  We recommend signing up for the patient portal called "MyChart".  Sign up information is provided on this After Visit Summary.  MyChart is used to connect with patients for Virtual Visits (Telemedicine).  Patients are able to view lab/test results, encounter notes, upcoming appointments, etc.  Non-urgent messages can be sent to your provider as well.   To learn more about what you can do with MyChart, go to [email or web address].    Your next appointment:   Thursday, 03/06/2020 at 315pm  The format for your next appointment:   Virtual  Provider:   Dr Caryl Comes

## 2020-01-17 NOTE — Telephone Encounter (Signed)
  Patient Consent for Virtual Visit         Jerry Barr has provided verbal consent on 01/17/2020 for a virtual visit (video or telephone).   CONSENT FOR VIRTUAL VISIT FOR:  Jerry Barr  By participating in this virtual visit I agree to the following:  I hereby voluntarily request, consent and authorize [facility] and its employed or contracted physicians, physician assistants, nurse practitioners or other licensed health care professionals (the Practitioner), to provide me with telemedicine health care services (the "Services") as deemed necessary by the treating Practitioner. I acknowledge and consent to receive the Services by the Practitioner via telemedicine. I understand that the telemedicine visit will involve communicating with the Practitioner through live audiovisual communication technology and the disclosure of certain medical information by electronic transmission. I acknowledge that I have been given the opportunity to request an in-person assessment or other available alternative prior to the telemedicine visit and am voluntarily participating in the telemedicine visit.  I understand that I have the right to withhold or withdraw my consent to the use of telemedicine in the course of my care at any time, without affecting my right to future care or treatment, and that the Practitioner or I may terminate the telemedicine visit at any time. I understand that I have the right to inspect all information obtained and/or recorded in the course of the telemedicine visit and may receive copies of available information for a reasonable fee.  I understand that some of the potential risks of receiving the Services via telemedicine include:  Marland Kitchen Delay or interruption in medical evaluation due to technological equipment failure or disruption; . Information transmitted may not be sufficient (e.g. poor resolution of images) to allow for appropriate medical decision making by the Practitioner;  and/or  . In rare instances, security protocols could fail, causing a breach of personal health information.  Furthermore, I acknowledge that it is my responsibility to provide information about my medical history, conditions and care that is complete and accurate to the best of my ability. I acknowledge that Practitioner's advice, recommendations, and/or decision may be based on factors not within their control, such as incomplete or inaccurate data provided by me or distortions of diagnostic images or specimens that may result from electronic transmissions. I understand that the practice of medicine is not an exact science and that Practitioner makes no warranties or guarantees regarding treatment outcomes. I acknowledge that a copy of this consent can be made available to me via my patient portal ([place]), or I can request a printed copy by calling the office of [facility].    I understand that my insurance will be billed for this visit.   I have read or had this consent read to me. . I understand the contents of this consent, which adequately explains the benefits and risks of the Services being provided via telemedicine.  . I have been provided ample opportunity to ask questions regarding this consent and the Services and have had my questions answered to my satisfaction. . I give my informed consent for the services to be provided through the use of telemedicine in my medical care

## 2020-01-17 NOTE — Progress Notes (Signed)
Electrophysiology TeleHealth Note   Due to national recommendations of social distancing due to COVID 19, an audio/video telehealth visit is felt to be most appropriate for this patient at this time.  See MyChart message from today for the patient's consent to telehealth for [facility].   Date:  01/17/2020   ID:  Jerry Barr, DOB 1941-07-09, MRN [id number]  Location: patient's home  Provider location: [address], [place]  Evaluation Performed: Follow-up visit  PCP:  Antony Contras, MD  Cardiologist:  PJ   Electrophysiologist:  SK   Chief Complaint:  AFib  History of Present Illness:    Jerry Barr is a 79 y.o. male who presents via audio/video conferencing for a telehealth visit today.  Since last being seen in our clinic for thought to be infrequent atrial fib and significant orthostatic hypotension with little HR response concerning for more systemic autonomic insufficiency and having stopped amiodarone the patient reports feeling much better.  Improved energy. Wearing compression with improvement in OI   At his last visit>> event recorder confirmed detected fast HR as PAF but of max 7 min + duration-- episodes of AF are assoc with LH   History of remote coronary artery disease with stenting LAD 2011 circumflex 2020  inability to tolerate higher doses of amiodarone  DATE TEST EF   10/17 Echo   50-55% %   12/18 Echo  45-50%   12/20 LHC  LAD Stents patent  Cx-stent patent; RCA patent   Date Cr K TSH LFTs Hgb  5/21 1.26 4.6 1.19 20 13.9           Thromboembolic risk factors ( age -82, HTN-1, Vasc disease -1) for a CHADSVASc Score of >=4    The patient denies symptoms of fevers, chills, cough, or new SOB worrisome for COVID 19.    Past Medical History:  Diagnosis Date  . Allergy   . Aortic atherosclerosis ([place])   . Atrial fibrillation ([place])   . BPH (benign prostatic hyperplasia)   . CAD (coronary artery disease)   . Cancer  (HCC)    basal cell ca - face, nose and right leg  . Clotting disorder (HCC)    a fib hx - Xarelto  . Coronary artery disease    Promus drug-eluting stent to a 99% mid LAD, 40-50% narrowing in the distal LAD with mild to moderate disease in the circ and RCA b. 5/6 PCI/DESx1 to mLcx  . Dyslipidemia   . ED (erectile dysfunction)   . Hearing loss   . Hyperplastic colon polyp   . Hypoglycemia   . Orthostatic hypotension   . REM sleep behavior disorder    not tested    Past Surgical History:  Procedure Laterality Date  . CARDIOVERSION N/A 04/23/2016   Procedure: CARDIOVERSION;  Surgeon: [name], MD;  Location: [facility] ENDOSCOPY;  Service: Cardiovascular;  Laterality: N/A;  . COLONOSCOPY    . CORONARY ANGIOPLASTY WITH STENT PLACEMENT    . CORONARY STENT INTERVENTION N/A 11/15/2018   Procedure: CORONARY STENT INTERVENTION;  Surgeon: Burnell Blanks, MD;  Location: [facility] CV LAB;  Service: Cardiovascular;  Laterality: N/A;  . LEFT HEART CATH AND CORONARY ANGIOGRAPHY N/A 11/15/2018   Procedure: LEFT HEART CATH AND CORONARY ANGIOGRAPHY;  Surgeon: Burnell Blanks, MD;  Location: [facility] CV LAB;  Service: Cardiovascular;  Laterality: N/A;  . LEFT HEART CATH AND CORONARY ANGIOGRAPHY N/A 06/25/2019   Procedure: LEFT HEART CATH AND CORONARY ANGIOGRAPHY;  Surgeon: Martinique, Peter M, MD;  Location: [facility] CV LAB;  Service: Cardiovascular;  Laterality: N/A;  . POLYPECTOMY    . TEE WITHOUT CARDIOVERSION N/A 04/23/2016   Procedure: TRANSESOPHAGEAL ECHOCARDIOGRAM (TEE);  Surgeon: [name], MD;  Location: [facility] ENDOSCOPY;  Service: Cardiovascular;  Laterality: N/A;  . UPPER GASTROINTESTINAL ENDOSCOPY      Current Outpatient Medications  Medication Sig Dispense Refill  . acetaminophen (TYLENOL) 500 MG tablet Take 500 mg by mouth every 6 (six) hours as needed for headache (pain).    Marland Kitchen atorvastatin (LIPITOR) 20 MG tablet TAKE 1 TABLET BY MOUTH EVERY DAY 90 tablet 3  .  Cholecalciferol (VITAMIN D-3) 1000 UNITS CAPS Take 1,000 Units by mouth daily.    . famotidine (PEPCID) 20 MG tablet Take 1 tablet (20 mg total) by mouth 2 (two) times daily as needed for heartburn or indigestion.    . fluticasone (FLONASE) 50 MCG/ACT nasal spray Place 1 spray into both nostrils daily as needed (seasonal allergies).     . Ketotifen Fumarate (ALLERGY EYE DROPS OP) Place 1 drop into both eyes daily as needed (allergies).    . loratadine (CLARITIN) 10 MG tablet Take 10 mg by mouth as needed for allergies.     . Melatonin 3 MG TABS Take 6 mg by mouth at bedtime as needed (sleep aid).     . midodrine (PROAMATINE) 5 MG tablet TAKE 2 TABLETS 3 TIMES A DAY WITH MEALS 180 tablet 1  . Wheat Dextrin (BENEFIBER PO) Take 30 mLs by mouth daily as needed (Constipation).     Alveda Reasons 20 MG TABS tablet TAKE 1 TABLET (20 MG TOTAL) BY MOUTH DAILY WITH SUPPER. 90 tablet 1   Current Facility-Administered Medications  Medication Dose Route Frequency Provider Last Rate Last Admin  . sodium chloride flush (NS) 0.9 % injection 3 mL  3 mL Intravenous Q12H Martinique, Peter M, MD        Allergies:   Penicillins   Social History:  The patient  reports that he has never smoked. He has never used smokeless tobacco. He reports current alcohol use of about 3.0 standard drinks of alcohol per week. He reports that he does not use drugs.   Family History:  The patient's   family history includes Bone cancer in his paternal grandfather; Breast cancer in his mother; CAD in his father; Cancer in his brother; Colon cancer in his brother and mother; Esophageal cancer in his brother; Heart attack in his paternal grandmother; Leukemia in his paternal uncle; Liver cancer in his brother.   ROS:  Please see the history of present illness.   All other systems are personally reviewed and negative.    Exam:    Vital Signs:  BP 112/68 (Patient Position: Standing)   Pulse 74   Wt 149 lb (67.6 kg)   BMI 20.21 kg/m       2   Labs/Other Tests and Data Reviewed:    Recent Labs: 11/20/2019: ALT 20; Hemoglobin 13.9; Platelets 222; TSH 1.190 11/29/2019: BUN 25; Creatinine, Ser 1.37; Potassium 4.3; Sodium 138   Wt Readings from Last 3 Encounters:  01/17/20 149 lb (67.6 kg)  11/26/19 153 lb (69.4 kg)  11/20/19 152 lb 6.4 oz (69.1 kg)     Other studies personally reviewed: Additional studies/ records that were reviewed today include:      ASSESSMENT & PLAN:     Orthostatic intolerance  Question generalized autonomic insufficiency  Coronary artery disease with prior stenting  Atrial fibrillation  High Risk Medication Surveillance amiodarone   Palpitations  Chest pain may be ischemia 2/2 HR  Would like to try low dose betablocker and have discussed the untoward effects specifically related to aggravation of BP--His HR are not fast based on his monitor ( < 0.6% > 100 bpm) but seemingly enough to trigger what sounds like angina.  I am encouraged however that he is able to generate a HR with activity that he could NOT with standing and orthostatic hypotension   I do not understand the discordance   AFib burden is 0.6% so not the main issue for his palps-- so no need of Anti-arrhythmic drugs   Discussed ED use of Cialis but its long duration > 24 hrs is probably the wrong choice  So would recommend a shorter acting drug  Ok to drink some wine    COVID 19 screen The patient denies symptoms of COVID 19 at this time.  The importance of social distancing was discussed today.  Follow-up: 4 wk     Current medicines are reviewed at length with the patient today.   The patient does not have concerns regarding his medicines.  The following changes were made today:  beign metoprolol 12.5 bid  Labs/ tests ordered today include:   No orders of the defined types were placed in this encounter.   Future tests ( post COVID )     Patient Risk:  after full review of this patients clinical status, I feel  that they are at moderate** * risk at this time.  Today, I have spent 22 minutes with the patient with telehealth technology discussing the above.  Signed, Virl Axe, MD  01/17/2020 2:18 PM     [place] [address] [phone] (office) [phone] (fax)

## 2020-01-24 ENCOUNTER — Other Ambulatory Visit: Payer: Self-pay | Admitting: Cardiology

## 2020-01-25 ENCOUNTER — Ambulatory Visit: Payer: Medicare Other | Admitting: Internal Medicine

## 2020-01-31 ENCOUNTER — Telehealth: Payer: Self-pay | Admitting: Internal Medicine

## 2020-01-31 NOTE — Telephone Encounter (Signed)
Pt c/o medication issue:  1. Name of Medication: metoprolol tartrate (LOPRESSOR) 25 MG tablet  2. How are you currently taking this medication (dosage and times per day)? 0.5 tablets (12.5 mg total) by mouth 2 (two) times daily.  3. Are you having a reaction (difficulty breathing--STAT)? Yes  4. What is your medication issue? Patient states the medication is causing fatigue, indigestion, and fluttering in chest.  Patient c/o Palpitations:  High priority if patient c/o lightheadedness, shortness of breath, or chest pain  1) How long have you had palpitations/irregular HR/ Afib? Are you having the symptoms now? Patient described the feeling as "heart flutters" - he is experiencing symptoms right now, however the flutters come and go  2) Are you currently experiencing lightheadedness, SOB or CP? No  3) Do you have a history of afib (atrial fibrillation) or irregular heart rhythm? Yes  4) Have you checked your BP or HR? (document readings if available):  (Seated)  01/27/20: BP-126/76 HR-62      BP- 147/76 HR-59  01/28/20: BP- 126/74 HR-60      BP- 109/69 HR-66  01/29/20: BP- 123/67  HR-58      BP- 124/76 HR-66  01/31/20: BP- 118/75 HR-66   5) Are you experiencing any other symptoms? No

## 2020-01-31 NOTE — Telephone Encounter (Signed)
Spoke with pt, he has not really seen any change in his heart rate with the starting of the metoprolol. He is now having heart fluttering and fatigue and indigestion. He reports he would rather live with the neck pain that goes away with rest than feel fatigued and the heart flutters. He has not taken the metoprolol this morning. Aware will forward to dr Caryl Comes to review and advise.

## 2020-02-04 NOTE — Telephone Encounter (Signed)
Good mornign  I dont have Metoprolol on his MAR and dont iknow why he would need to be on it So I would stop it Thanks SK

## 2020-02-04 NOTE — Telephone Encounter (Signed)
Spoke with pt, aware of dr [employer] recommendations. The patient continues to have occ palpitations and heart rate has not changed from previous. They are not like his previous atrial fib.

## 2020-02-04 NOTE — Telephone Encounter (Signed)
Patient called stating he has not heard back.  He states he is still having the same issues.

## 2020-02-20 DIAGNOSIS — E7800 Pure hypercholesterolemia, unspecified: Secondary | ICD-10-CM | POA: Diagnosis not present

## 2020-02-25 DIAGNOSIS — I480 Paroxysmal atrial fibrillation: Secondary | ICD-10-CM | POA: Diagnosis not present

## 2020-02-25 DIAGNOSIS — J302 Other seasonal allergic rhinitis: Secondary | ICD-10-CM | POA: Diagnosis not present

## 2020-02-25 DIAGNOSIS — Z0000 Encounter for general adult medical examination without abnormal findings: Secondary | ICD-10-CM | POA: Diagnosis not present

## 2020-02-25 DIAGNOSIS — I700 Atherosclerosis of aorta: Secondary | ICD-10-CM | POA: Diagnosis not present

## 2020-02-25 DIAGNOSIS — E7800 Pure hypercholesterolemia, unspecified: Secondary | ICD-10-CM | POA: Diagnosis not present

## 2020-02-25 DIAGNOSIS — N401 Enlarged prostate with lower urinary tract symptoms: Secondary | ICD-10-CM | POA: Diagnosis not present

## 2020-02-25 DIAGNOSIS — I2510 Atherosclerotic heart disease of native coronary artery without angina pectoris: Secondary | ICD-10-CM | POA: Diagnosis not present

## 2020-02-25 DIAGNOSIS — I951 Orthostatic hypotension: Secondary | ICD-10-CM | POA: Diagnosis not present

## 2020-02-25 DIAGNOSIS — Z1389 Encounter for screening for other disorder: Secondary | ICD-10-CM | POA: Diagnosis not present

## 2020-02-28 NOTE — Progress Notes (Signed)
Date:  03/03/2020   ID:  Jerry Barr, DOB 13-Nov-1940, MRN [id number]   PCP:  Antony Contras, MD  Cardiologist:  Lerline Valdivia Martinique, MD  Electrophysiologist:  None   Evaluation Performed:  Follow-Up Visit  Chief Complaint: shoulder pain.   History of Present Illness:    Jerry Barr is a 79 y.o. male seen for follow up post cardiac cath. He has a  PMH of HLD, chronic hypotension, PAF on Xarelto, CAD s/p DES to LAD 2011 in [place] and BPH. He was diagnosed with new afib in 04/2016. Echo in 04/2016 showed EF 50-55%, no RWMA, mild MR. He had successful TEE DCCV 04/23/2016. He was placed on midodrine for orthostatic hypotension. He is not on any rate control therapy for the same reason. Myoview  In 06/2017 showed EF 55%, no significant ischemia or scar. Echo in 06/2017 EF 45-50%, mild diffuse hypokinesis, PA peak pressure 24mmHg.  Patient was seen in the  ED in April  with recurrent afib and fatigue. Given his history of orthostatic hypotension, it was elected to proceed with cardioversion in the ED. This was performed by Dr. Haroldine Laws. Post cardioversion, his amiodarone was increased to 200mg  BID. After discharge, he was seen in the afib clinic with plans to consider decreasing amiodarone back to 200mg  daily after 1 month if no recurrent afib.   He was seen on 10/30/2018 for post hospital follow-up, at which time he noted  shoulder blade pain that occur with  walking. He had a  cardiac catheterization on 11/15/2018 which revealed 20% proximal RCA, 90% mid left circumflex lesion treated with a 2.25 x 16 mm Synergy DES, EF 55 to 65%.  Postprocedure, patient was discharged on aspirin, Plavix and Xarelto.  He stopped aspirin after 1 month.  He will require a minimum of 6 months of Plavix.   When seen in August he felt great.  He still had some days with orthostasis.  Wears support hose and uses midodrine in am.  He has been diagnosed with an REM sleep disorder managed by Dr Maxwell Caul. He was seen in the  Afib clinic on 05/16/19 and was maintaining NSR on 200 mg of amiodarone. More recently  he reported recurrent exertional neck and pain in his shoulder blade that resolves with rest. This may occur in the am with initial activity and also with his daily walk. Sometimes if he continues to walk it goes away after 15 minutes. He also notes increased heartburn in the past 2 weeks. These symptoms are similar to what he had in May and in 2011 with his initial stent. He underwent repeat Cardiac cath on 06/25/19 and this showed only nonobstructive disease with patent stents.   He has since been seen by Dr Caryl Comes for evaluation of his orthostatic hypotension- felt to be related to autonomic failure. Echo was OK. Dr Caryl Comes felt that amiodarone was making symptoms worse and this was discontinued. Event monitor showed AFib burden of 0.6% so AAD therapy was not needed. He was tried on low dose metoprolol but did not tolerate this due to fatigue and indigestion.   On follow up today he is doing well. He still has some orthostatic hypotension but not everyday and he has really learned to manage it. Uses compression hose and abdominal binder in am along with midodrine and salt water. Not aware of any Afib. No chest pain.   Past Medical History:  Diagnosis Date  . Allergy   . Aortic atherosclerosis ([place])   .  Atrial fibrillation ([place])   . BPH (benign prostatic hyperplasia)   . CAD (coronary artery disease)   . Cancer (HCC)    basal cell ca - face, nose and right leg  . Clotting disorder (HCC)    a fib hx - Xarelto  . Coronary artery disease    Promus drug-eluting stent to a 99% mid LAD, 40-50% narrowing in the distal LAD with mild to moderate disease in the circ and RCA b. 5/6 PCI/DESx1 to mLcx  . Dyslipidemia   . ED (erectile dysfunction)   . Hearing loss   . Hyperplastic colon polyp   . Hypoglycemia   . Orthostatic hypotension   . REM sleep behavior disorder    not tested   Past Surgical History:  Procedure  Laterality Date  . CARDIOVERSION N/A 04/23/2016   Procedure: CARDIOVERSION;  Surgeon: [name], MD;  Location: [facility] ENDOSCOPY;  Service: Cardiovascular;  Laterality: N/A;  . COLONOSCOPY    . CORONARY ANGIOPLASTY WITH STENT PLACEMENT    . CORONARY STENT INTERVENTION N/A 11/15/2018   Procedure: CORONARY STENT INTERVENTION;  Surgeon: Burnell Blanks, MD;  Location: [place] CV LAB;  Service: Cardiovascular;  Laterality: N/A;  . LEFT HEART CATH AND CORONARY ANGIOGRAPHY N/A 11/15/2018   Procedure: LEFT HEART CATH AND CORONARY ANGIOGRAPHY;  Surgeon: Burnell Blanks, MD;  Location: [place] CV LAB;  Service: Cardiovascular;  Laterality: N/A;  . LEFT HEART CATH AND CORONARY ANGIOGRAPHY N/A 06/25/2019   Procedure: LEFT HEART CATH AND CORONARY ANGIOGRAPHY;  Surgeon: Martinique, Tatumn Corbridge M, MD;  Location: [place] [facility] CV LAB;  Service: Cardiovascular;  Laterality: N/A;  . POLYPECTOMY    . TEE WITHOUT CARDIOVERSION N/A 04/23/2016   Procedure: TRANSESOPHAGEAL ECHOCARDIOGRAM (TEE);  Surgeon: [name], MD;  Location: [facility] ENDOSCOPY;  Service: Cardiovascular;  Laterality: N/A;  . UPPER GASTROINTESTINAL ENDOSCOPY       Current Meds  Medication Sig  . acetaminophen (TYLENOL) 500 MG tablet Take 500 mg by mouth every 6 (six) hours as needed for headache (pain).  Marland Kitchen atorvastatin (LIPITOR) 20 MG tablet TAKE 1 TABLET BY MOUTH EVERY DAY  . Cholecalciferol (VITAMIN D-3) 1000 UNITS CAPS Take 1,000 Units by mouth daily.  . famotidine (PEPCID) 20 MG tablet Take 1 tablet (20 mg total) by mouth 2 (two) times daily as needed for heartburn or indigestion.  . fluticasone (FLONASE) 50 MCG/ACT nasal spray Place 1 spray into both nostrils daily as needed (seasonal allergies).   . Ketotifen Fumarate (ALLERGY EYE DROPS OP) Place 1 drop into both eyes daily as needed (allergies).  . loratadine (CLARITIN) 10 MG tablet Take 10 mg by mouth as needed for allergies.   . Melatonin 3 MG TABS Take 6 mg by mouth at bedtime  as needed (sleep aid).   . midodrine (PROAMATINE) 5 MG tablet Take 5 mg by mouth as needed.   . Wheat Dextrin (BENEFIBER PO) Take 30 mLs by mouth daily as needed (Constipation).   Alveda Reasons 20 MG TABS tablet TAKE 1 TABLET (20 MG TOTAL) BY MOUTH DAILY WITH SUPPER.   Current Facility-Administered Medications for the 03/03/20 encounter (Office Visit) with Martinique, Rosalene Wardrop M, MD  Medication  . sodium chloride flush (NS) 0.9 % injection 3 mL     Allergies:   Penicillins   Social History   Tobacco Use  . Smoking status: Never Smoker  . Smokeless tobacco: Never Used  Vaping Use  . Vaping Use: Never used  Substance Use Topics  . Alcohol use:  Yes    Alcohol/week: 3.0 standard drinks    Types: 3 Glasses of wine per week    Comment: red wine weekly  . Drug use: No     Family Hx: The patient's family history includes Bone cancer in his paternal grandfather; Breast cancer in his mother; CAD in his father; Cancer in his brother; Colon cancer in his brother and mother; Esophageal cancer in his brother; Heart attack in his paternal grandmother; Leukemia in his paternal uncle; Liver cancer in his brother. There is no history of Pancreatic cancer, Prostate cancer, Rectal cancer, or Stomach cancer.  ROS:   Please see the history of present illness.     All other systems reviewed and are negative.   Prior CV studies:   The following studies were reviewed today:  Echo 06/13/17: Study Conclusions  - Left ventricle: The cavity size was normal. Systolic function was   mildly reduced. The estimated ejection fraction was in the range   of 45% to 50%. Mild diffuse hypokinesis. Left ventricular   diastolic function parameters were normal. - Aortic valve: Transvalvular velocity was within the normal range.   There was no stenosis. There was trivial regurgitation. - Mitral valve: There was trivial regurgitation. - Left atrium: The atrium was moderately dilated. - Right ventricle: The cavity size was  normal. Wall thickness was   normal. Systolic function was normal. - Right atrium: The atrium was severely dilated. - Atrial septum: No defect or patent foramen ovale was identified   by color flow Doppler. - Tricuspid valve: There was trivial regurgitation. - Pulmonary arteries: Systolic pressure was within the normal   range. PA peak pressure: 24 mm Hg (S).  Cath 11/15/2018  Prox RCA lesion is 20% stenosed.  Mid Cx lesion is 90% stenosed.  Previously placed Prox LAD to Mid LAD stent (unknown type) is widely patent.  A drug-eluting stent was successfully placed using a STENT SYNERGY DES 2.25X16.  Post intervention, there is a 0% residual stenosis.  The left ventricular systolic function is normal.  LV end diastolic pressure is normal.  The left ventricular ejection fraction is 55-65% by visual estimate.  There is no mitral valve regurgitation.   1. Patent mid LAD stent without restenosis 2. Severe stenosis mid Circumflex artery.  3. Successful PTCA/DES x 1 mid Circumflex 4. Mild non-obstructive disease in the RCA 5. Normal LV systolic function  Recommendations: ASA and Plavix for one month then stop ASA. Will restart Xarelto tomorrow. Would recommend at least six month of Plavix and Xarelto but could consider stopping Plavix at 6 months if necessary. Likely same day PCI discharge.    Cardiac cath 06/25/19: Procedures  LEFT HEART CATH AND CORONARY ANGIOGRAPHY  Conclusion    Non-stenotic Prox LAD to Mid LAD lesion was previously treated.  Previously placed Mid Cx drug eluting stent is widely patent.  Balloon angioplasty was performed.  Prox RCA lesion is 20% stenosed.  The left ventricular systolic function is normal.  LV end diastolic pressure is normal.  The left ventricular ejection fraction is 55-65% by visual estimate.   1. Nonobstructive CAD- patent stents in LAD and LCx. 2. Normal LV function 3. Normal LVEDP  Plan: continue medical therapy.      Echo 12/14/19: IMPRESSIONS    1. Left ventricular ejection fraction, by estimation, is 55 to 60%. The  left ventricle has normal function. The left ventricle has no regional  wall motion abnormalities. Left ventricular diastolic parameters were  normal. The average left  ventricular  global longitudinal strain is -20.5 %. The global longitudinal strain is  normal.  2. Right ventricular systolic function is normal. The right ventricular  size is normal. There is normal pulmonary artery systolic pressure. The  estimated right ventricular systolic pressure is 32.9 mmHg.  3. The mitral valve is normal in structure. Mild mitral valve  regurgitation. No evidence of mitral stenosis.  4. The aortic valve is normal in structure. Aortic valve regurgitation is  not visualized. No aortic stenosis is present.  5. The inferior vena cava is normal in size with greater than 50%  respiratory variability, suggesting right atrial pressure of 3 mmHg.    Event monitor 01/01/20: Study Highlights  Indication: palpitations  Duration: 30d  Findings HR  avg 67  Min 51-Max 127  PVCs <1% PACs about 1 % AFib noted < 1% Recommendations Will try low dose BB See note   Labs/Other Tests and Data Reviewed:     Recent Labs: 11/20/2019: ALT 20; Hemoglobin 13.9; Platelets 222; TSH 1.190 11/29/2019: BUN 25; Creatinine, Ser 1.37; Potassium 4.3; Sodium 138   Recent Lipid Panel Lab Results  Component Value Date/Time   CHOL 120 03/23/2019 08:38 AM   TRIG 42 03/23/2019 08:38 AM   HDL 50 03/23/2019 08:38 AM   CHOLHDL 2.4 03/23/2019 08:38 AM   CHOLHDL 3.0 06/29/2016 10:11 AM   LDLCALC 59 03/23/2019 08:38 AM    Wt Readings from Last 3 Encounters:  03/03/20 154 lb 6.4 oz (70 kg)  01/17/20 149 lb (67.6 kg)  11/26/19 153 lb (69.4 kg)     Objective:    Vital Signs:  BP 104/80   Pulse 82   Ht 6' (1.829 m)   Wt 154 lb 6.4 oz (70 kg)   SpO2 98%   BMI 20.94 kg/m    GENERAL:  Well appearing WM  in NAD HEENT:  PERRL, EOMI, sclera are clear. Oropharynx is clear. NECK:  No jugular venous distention, carotid upstroke brisk and symmetric, no bruits, no thyromegaly or adenopathy LUNGS:  Clear to auscultation bilaterally CHEST:  Unremarkable HEART:  RRR,  PMI not displaced or sustained,S1 and S2 within normal limits, no S3, no S4: no clicks, no rubs, no murmurs ABD:  Soft, nontender. BS +, no masses or bruits. No hepatomegaly, no splenomegaly EXT:  2 + pulses throughout, no edema, no cyanosis no clubbing. No radial site hematoma SKIN:  Warm and dry.  No rashes NEURO:  Alert and oriented x 3. Cranial nerves II through XII intact. PSYCH:  Cognitively intact    ASSESSMENT & PLAN:    1.   CAD: s/p PCI of the LCx with DES in May.  Prior stent of the mid LAD in 2011. Repeat cardiac cath in Dec 2020 showed nonobstructive disease.   He is on  therapy with  Plavix and Xarelto.  Antianginal therapy limited by orthostatic hypotension.  2.   PAF: s/p DCCV on October 15, 2019.   Continue Xarelto therapy.  Unable to tolerate  rate control therapy with metoprolol  or amiodarone due to side effects.  Afib burden now < 1%.   3.   Chronic orthostatic hypotension/ autonomic failure: His blood pressure is stable. He reports some this is doing much better and he has learned to manage it.   Continue supportive care.  4.   Hyperlipidemia: Continue current statin. Excellent control.     Disposition:  6 month   Signed, Alexandar Weisenberger Martinique, MD  03/03/2020 11:19 AM    [facility]

## 2020-03-03 ENCOUNTER — Encounter: Payer: Self-pay | Admitting: Cardiology

## 2020-03-03 ENCOUNTER — Other Ambulatory Visit: Payer: Self-pay

## 2020-03-03 ENCOUNTER — Ambulatory Visit (INDEPENDENT_AMBULATORY_CARE_PROVIDER_SITE_OTHER): Payer: Medicare Other | Admitting: Cardiology

## 2020-03-03 VITALS — BP 104/80 | HR 82 | Ht 72.0 in | Wt 154.4 lb

## 2020-03-03 DIAGNOSIS — I951 Orthostatic hypotension: Secondary | ICD-10-CM

## 2020-03-03 DIAGNOSIS — E785 Hyperlipidemia, unspecified: Secondary | ICD-10-CM | POA: Diagnosis not present

## 2020-03-03 DIAGNOSIS — I480 Paroxysmal atrial fibrillation: Secondary | ICD-10-CM | POA: Diagnosis not present

## 2020-03-03 DIAGNOSIS — I2510 Atherosclerotic heart disease of native coronary artery without angina pectoris: Secondary | ICD-10-CM | POA: Diagnosis not present

## 2020-03-03 DIAGNOSIS — I25110 Atherosclerotic heart disease of native coronary artery with unstable angina pectoris: Secondary | ICD-10-CM | POA: Diagnosis not present

## 2020-03-06 ENCOUNTER — Other Ambulatory Visit: Payer: Self-pay

## 2020-03-06 ENCOUNTER — Telehealth (INDEPENDENT_AMBULATORY_CARE_PROVIDER_SITE_OTHER): Payer: Medicare Other | Admitting: Internal Medicine

## 2020-03-06 VITALS — BP 83/54 | HR 79 | Wt 153.0 lb

## 2020-03-06 DIAGNOSIS — I480 Paroxysmal atrial fibrillation: Secondary | ICD-10-CM | POA: Diagnosis not present

## 2020-03-06 DIAGNOSIS — I25110 Atherosclerotic heart disease of native coronary artery with unstable angina pectoris: Secondary | ICD-10-CM

## 2020-03-06 DIAGNOSIS — R002 Palpitations: Secondary | ICD-10-CM | POA: Diagnosis not present

## 2020-03-06 DIAGNOSIS — I951 Orthostatic hypotension: Secondary | ICD-10-CM

## 2020-03-06 NOTE — Progress Notes (Signed)
Marland Kitchen     Electrophysiology TeleHealth Note   Due to national recommendations of social distancing due to COVID 19, an audio/video telehealth visit is felt to be most appropriate for this patient at this time.  See MyChart message from today for the patient's consent to telehealth for [facility].   Date:  03/06/2020   ID:  Jerry Barr, DOB 1940-09-13, MRN [id number]  Location: patient's home  Provider location: [address] [place]  Evaluation Performed: Follow-up visit  PCP:  Antony Contras, MD  Cardiologist:  PJ   Electrophysiologist:  SK   Chief Complaint:  AFib  History of Present Illness:    Jerry Barr is a 79 y.o. male who presents via audio/video conferencing for a telehealth visit today.  Since last being seen in our clinic for thought to be infrequent atrial fib and significant orthostatic hypotension with little HR response concerning for more systemic autonomic insufficiency and having stopped amiodarone with subsequent improvement but also symptoms(neck pain) of angina with HR > 100 for which BB were started the patient reports it made things worse, tired stomach ache and palpitations.  But overall feels better than he has in years   Wearing compression with improvement in OI, esp in am  At his last visit>> event recorder confirmed detected fast HR as PAF but of max 7 min + duration-- episodes of AF are assoc with LH   History of remote coronary artery disease with stenting LAD 2011 circumflex 2020    DATE TEST EF   10/17 Echo   50-55% %   12/18 Echo  45-50%   12/20 LHC  LAD Stents patent  Cx-stent patent; RCA patent   Date Cr K TSH LFTs Hgb  5/21 1.26 4.6 1.19 20 13.9           Thromboembolic risk factors ( age -15, HTN-1, Vasc disease -1) for a CHADSVASc Score of >=4    The patient denies symptoms of fevers, chills, cough, or new SOB worrisome for COVID 19.    Past Medical History:  Diagnosis Date  . Allergy   . Aortic  atherosclerosis ([place])   . Atrial fibrillation ([place])   . BPH (benign prostatic hyperplasia)   . CAD (coronary artery disease)   . Cancer (HCC)    basal cell ca - face, nose and right leg  . Clotting disorder (HCC)    a fib hx - Xarelto  . Coronary artery disease    Promus drug-eluting stent to a 99% mid LAD, 40-50% narrowing in the distal LAD with mild to moderate disease in the circ and RCA b. 5/6 PCI/DESx1 to mLcx  . Dyslipidemia   . ED (erectile dysfunction)   . Hearing loss   . Hyperplastic colon polyp   . Hypoglycemia   . Orthostatic hypotension   . REM sleep behavior disorder    not tested    Past Surgical History:  Procedure Laterality Date  . CARDIOVERSION N/A 04/23/2016   Procedure: CARDIOVERSION;  Surgeon: [name], MD;  Location: [facility] ENDOSCOPY;  Service: Cardiovascular;  Laterality: N/A;  . COLONOSCOPY    . CORONARY ANGIOPLASTY WITH STENT PLACEMENT    . CORONARY STENT INTERVENTION N/A 11/15/2018   Procedure: CORONARY STENT INTERVENTION;  Surgeon: Burnell Blanks, MD;  Location: [place] CV LAB;  Service: Cardiovascular;  Laterality: N/A;  . LEFT HEART CATH AND CORONARY ANGIOGRAPHY N/A 11/15/2018   Procedure: LEFT HEART CATH AND CORONARY ANGIOGRAPHY;  Surgeon: Burnell Blanks, MD;  Location: [place] CV LAB;  Service: Cardiovascular;  Laterality: N/A;  . LEFT HEART CATH AND CORONARY ANGIOGRAPHY N/A 06/25/2019   Procedure: LEFT HEART CATH AND CORONARY ANGIOGRAPHY;  Surgeon: Martinique, Peter M, MD;  Location: [place] CV LAB;  Service: Cardiovascular;  Laterality: N/A;  . POLYPECTOMY    . TEE WITHOUT CARDIOVERSION N/A 04/23/2016   Procedure: TRANSESOPHAGEAL ECHOCARDIOGRAM (TEE);  Surgeon: [name], MD;  Location: [facility] ENDOSCOPY;  Service: Cardiovascular;  Laterality: N/A;  . UPPER GASTROINTESTINAL ENDOSCOPY      Current Outpatient Medications  Medication Sig Dispense Refill  . acetaminophen (TYLENOL) 500 MG tablet Take 500 mg by mouth every 6  (six) hours as needed for headache (pain).    Marland Kitchen atorvastatin (LIPITOR) 20 MG tablet TAKE 1 TABLET BY MOUTH EVERY DAY 90 tablet 3  . Cholecalciferol (VITAMIN D-3) 1000 UNITS CAPS Take 1,000 Units by mouth daily.    . famotidine (PEPCID) 20 MG tablet Take 1 tablet (20 mg total) by mouth 2 (two) times daily as needed for heartburn or indigestion.    . fluticasone (FLONASE) 50 MCG/ACT nasal spray Place 1 spray into both nostrils daily as needed (seasonal allergies).     . Ketotifen Fumarate (ALLERGY EYE DROPS OP) Place 1 drop into both eyes daily as needed (allergies).    . loratadine (CLARITIN) 10 MG tablet Take 10 mg by mouth as needed for allergies.     . Melatonin 3 MG TABS Take 6 mg by mouth at bedtime as needed (sleep aid).     . midodrine (PROAMATINE) 5 MG tablet Take 5 mg by mouth as needed.     . polyethylene glycol powder (GLYCOLAX/MIRALAX) 17 GM/SCOOP powder Take 1 Container by mouth once.    . Wheat Dextrin (BENEFIBER PO) Take 30 mLs by mouth daily as needed (Constipation).     Alveda Reasons 20 MG TABS tablet TAKE 1 TABLET (20 MG TOTAL) BY MOUTH DAILY WITH SUPPER. 90 tablet 1   Current Facility-Administered Medications  Medication Dose Route Frequency Provider Last Rate Last Admin  . sodium chloride flush (NS) 0.9 % injection 3 mL  3 mL Intravenous Q12H Martinique, Peter M, MD        Allergies:   Penicillins   Social History:  The patient  reports that he has never smoked. He has never used smokeless tobacco. He reports current alcohol use of about 3.0 standard drinks of alcohol per week. He reports that he does not use drugs.   Family History:  The patient's   family history includes Bone cancer in his paternal grandfather; Breast cancer in his mother; CAD in his father; Cancer in his brother; Colon cancer in his brother and mother; Esophageal cancer in his brother; Heart attack in his paternal grandmother; Leukemia in his paternal uncle; Liver cancer in his brother.   ROS:  Please see the  history of present illness.   All other systems are personally reviewed and negative.    Exam:    Vital Signs:  BP (!) 83/54   Pulse 79   Wt 153 lb (69.4 kg)   BMI 20.75 kg/m         Labs/Other Tests and Data Reviewed:    Recent Labs: 11/20/2019: ALT 20; Hemoglobin 13.9; Platelets 222; TSH 1.190 11/29/2019: BUN 25; Creatinine, Ser 1.37; Potassium 4.3; Sodium 138   Wt Readings from Last 3 Encounters:  03/06/20 153 lb (69.4 kg)  03/03/20 154 lb 6.4 oz (70 kg)  01/17/20 149 lb (67.6  kg)     Other studies personally reviewed: Additional studies/ records that were reviewed today include:      ASSESSMENT & PLAN:     Orthostatic intolerance  Question generalized autonomic insufficiency  Coronary artery disease with prior stenting  Atrial fibrillation   Palpitations  Presume that the neck pain is an anginal equivalent.  Discussed options incl doing nothing or trying different meds--he also discussed with Dr Shirlee More  Orthostasis is much improved.  Using the abdominal binder.  No longer on amiodarone.  Would use short acting ED drug    COVID 19 screen The patient denies symptoms of COVID 19 at this time.  The importance of social distancing was discussed today.  Follow-up: 4 month    Current medicines are reviewed at length with the patient today.   The patient does not have concerns regarding his medicines.  The following changes were made today:  none  Labs/ tests ordered today include:   No orders of the defined types were placed in this encounter.   Future tests ( post COVID )     Patient Risk:  after full review of this patients clinical status, I feel that they are at moderate  risk at this time.  Today, I have spent 15 minutes with the patient with telehealth technology discussing the above.  Signed, Virl Axe, MD  03/06/2020 3:21 PM     [address] [address] [place] [address] [phone]  (office) [phone] (fax)

## 2020-03-06 NOTE — Patient Instructions (Addendum)
Medication Instructions:  Your physician recommends that you continue on your current medications as directed. Please refer to the Current Medication list given to you today.  *If you need a refill on your cardiac medications before your next appointment, please call your pharmacy*   Lab Work: None ordered.  If you have labs (blood work) drawn today and your tests are completely normal, you will receive your results only by: Marland Kitchen MyChart Message (if you have MyChart) OR . A paper copy in the mail If you have any lab test that is abnormal or we need to change your treatment, we will call you to review the results.   Testing/Procedures: None ordered.    Follow-Up: At [facility], you and your health needs are our priority.  As part of our continuing mission to provide you with exceptional heart care, we have created designated Provider Care Teams.  These Care Teams include your primary Cardiologist (physician) and Advanced Practice Providers (APPs -  Physician Assistants and Nurse Practitioners) who all work together to provide you with the care you need, when you need it.  We recommend signing up for the patient portal called "MyChart".  Sign up information is provided on this After Visit Summary.  MyChart is used to connect with patients for Virtual Visits (Telemedicine).  Patients are able to view lab/test results, encounter notes, upcoming appointments, etc.  Non-urgent messages can be sent to your provider as well.   To learn more about what you can do with MyChart, go to [email or web address].    Your next appointment:   Wednesday 07/02/2020 at 845am with Dr Caryl Comes

## 2020-03-19 DIAGNOSIS — Z23 Encounter for immunization: Secondary | ICD-10-CM | POA: Diagnosis not present

## 2020-03-24 DIAGNOSIS — Z23 Encounter for immunization: Secondary | ICD-10-CM | POA: Diagnosis not present

## 2020-05-08 DIAGNOSIS — N401 Enlarged prostate with lower urinary tract symptoms: Secondary | ICD-10-CM | POA: Diagnosis not present

## 2020-05-08 DIAGNOSIS — I4891 Unspecified atrial fibrillation: Secondary | ICD-10-CM | POA: Diagnosis not present

## 2020-05-08 DIAGNOSIS — I2510 Atherosclerotic heart disease of native coronary artery without angina pectoris: Secondary | ICD-10-CM | POA: Diagnosis not present

## 2020-05-08 DIAGNOSIS — E785 Hyperlipidemia, unspecified: Secondary | ICD-10-CM | POA: Diagnosis not present

## 2020-05-08 DIAGNOSIS — E7800 Pure hypercholesterolemia, unspecified: Secondary | ICD-10-CM | POA: Diagnosis not present

## 2020-05-08 DIAGNOSIS — I480 Paroxysmal atrial fibrillation: Secondary | ICD-10-CM | POA: Diagnosis not present

## 2020-05-12 DIAGNOSIS — Z20822 Contact with and (suspected) exposure to covid-19: Secondary | ICD-10-CM | POA: Diagnosis not present

## 2020-05-15 ENCOUNTER — Encounter (HOSPITAL_COMMUNITY): Payer: Self-pay | Admitting: Emergency Medicine

## 2020-05-15 ENCOUNTER — Ambulatory Visit (INDEPENDENT_AMBULATORY_CARE_PROVIDER_SITE_OTHER): Payer: Medicare Other

## 2020-05-15 ENCOUNTER — Ambulatory Visit (HOSPITAL_COMMUNITY)
Admission: EM | Admit: 2020-05-15 | Discharge: 2020-05-15 | Disposition: A | Discharge status: Home / Self Care | Payer: Medicare Other | Attending: Internal Medicine | Admitting: Internal Medicine

## 2020-05-15 ENCOUNTER — Other Ambulatory Visit: Payer: Self-pay

## 2020-05-15 DIAGNOSIS — R509 Fever, unspecified: Secondary | ICD-10-CM

## 2020-05-15 DIAGNOSIS — I951 Orthostatic hypotension: Secondary | ICD-10-CM | POA: Diagnosis not present

## 2020-05-15 DIAGNOSIS — Z20822 Contact with and (suspected) exposure to covid-19: Secondary | ICD-10-CM | POA: Insufficient documentation

## 2020-05-15 DIAGNOSIS — J069 Acute upper respiratory infection, unspecified: Secondary | ICD-10-CM | POA: Diagnosis not present

## 2020-05-15 DIAGNOSIS — B9789 Other viral agents as the cause of diseases classified elsewhere: Secondary | ICD-10-CM

## 2020-05-15 DIAGNOSIS — R059 Cough, unspecified: Secondary | ICD-10-CM | POA: Diagnosis not present

## 2020-05-15 DIAGNOSIS — J988 Other specified respiratory disorders: Secondary | ICD-10-CM | POA: Diagnosis not present

## 2020-05-15 LAB — RESP PANEL BY RT PCR (RSV, FLU A&B, COVID)
Influenza A by PCR: NEGATIVE
Influenza B by PCR: NEGATIVE
Respiratory Syncytial Virus by PCR: POSITIVE — AB
SARS Coronavirus 2 by RT PCR: NEGATIVE

## 2020-05-15 NOTE — ED Provider Notes (Signed)
[facility]    CSN: [id number] Arrival date & time: 05/15/20  1145      History   Chief Complaint Chief Complaint  Patient presents with  . Nasal Congestion  . Fever  . Cough    HPI Jerry Barr is a 79 y.o. male with past medical history of A. fib on Xarelto, CAD, orthostatic hypotension presents to urgent care with complaints of nasal congestion  fever and cough.  Patient states he has been feeling unwell since evening of 11/29 after attending a [place].  Symptoms began as chills and progressed over that weekend to sneezing and runny nose.  Patient now with chest congestion and cough productive of dark green sputum.  Patient still with intermittent body aches and fever.  He denies any recent chest pain, SOB, dizziness or dysuria.  He does report decrease in appetite but is able to tolerate p.o.'s without any GI upset.  He feels guaifenesin is helping as well as as needed Tylenol.  Patient fully vaccinated against NFAOZ-30 with booster received September 2021.  Patient states wife has started developing similar symptoms    Past Medical History:  Diagnosis Date  . Allergy   . Aortic atherosclerosis ([place])   . Atrial fibrillation ([place])   . BPH (benign prostatic hyperplasia)   . CAD (coronary artery disease)   . Cancer (HCC)    basal cell ca - face, nose and right leg  . Clotting disorder (HCC)    a fib hx - Xarelto  . Coronary artery disease    Promus drug-eluting stent to a 99% mid LAD, 40-50% narrowing in the distal LAD with mild to moderate disease in the circ and RCA b. 5/6 PCI/DESx1 to mLcx  . Dyslipidemia   . ED (erectile dysfunction)   . Hearing loss   . Hyperplastic colon polyp   . Hypoglycemia   . Orthostatic hypotension   . REM sleep behavior disorder    not tested    Patient Active Problem List   Diagnosis Date Noted  . Secondary hypercoagulable state ([place]) 05/16/2019  . Unstable angina ([place])   . Orthostatic hypotension 06/13/2017  . Paroxysmal  atrial fibrillation ([place]) 04/20/2016  . Palpitations 04/20/2016  . Opacity of lung on imaging study 04/20/2016  . Atrial fibrillation with RVR ([place]) 04/20/2016  . Angina pectoris ([place])   . Dyslipidemia   . CAD (coronary artery disease), native coronary artery 10/29/2014    Past Surgical History:  Procedure Laterality Date  . CARDIOVERSION N/A 04/23/2016   Procedure: CARDIOVERSION;  Surgeon: [name], MD;  Location: [facility] ENDOSCOPY;  Service: Cardiovascular;  Laterality: N/A;  . COLONOSCOPY    . CORONARY ANGIOPLASTY WITH STENT PLACEMENT    . CORONARY STENT INTERVENTION N/A 11/15/2018   Procedure: CORONARY STENT INTERVENTION;  Surgeon: Burnell Blanks, MD;  Location: [facility] CV LAB;  Service: Cardiovascular;  Laterality: N/A;  . LEFT HEART CATH AND CORONARY ANGIOGRAPHY N/A 11/15/2018   Procedure: LEFT HEART CATH AND CORONARY ANGIOGRAPHY;  Surgeon: Burnell Blanks, MD;  Location: [place] CV LAB;  Service: Cardiovascular;  Laterality: N/A;  . LEFT HEART CATH AND CORONARY ANGIOGRAPHY N/A 06/25/2019   Procedure: LEFT HEART CATH AND CORONARY ANGIOGRAPHY;  Surgeon: Martinique, Peter M, MD;  Location: [place] CV LAB;  Service: Cardiovascular;  Laterality: N/A;  . POLYPECTOMY    . TEE WITHOUT CARDIOVERSION N/A 04/23/2016   Procedure: TRANSESOPHAGEAL ECHOCARDIOGRAM (TEE);  Surgeon: [name], MD;  Location: [place];  Service: Cardiovascular;  Laterality: N/A;  . UPPER GASTROINTESTINAL ENDOSCOPY         Home Medications    Prior to Admission medications   Medication Sig Start Date End Date Taking? Authorizing Provider  acetaminophen (TYLENOL) 500 MG tablet Take 500 mg by mouth every 6 (six) hours as needed for headache (pain).    [provider]  atorvastatin (LIPITOR) 20 MG tablet TAKE 1 TABLET BY MOUTH EVERY DAY 08/06/19   Lendon Colonel, NP  Cholecalciferol (VITAMIN D-3) 1000 UNITS CAPS Take 1,000 Units by mouth daily.    [provider]   famotidine (PEPCID) 20 MG tablet Take 1 tablet (20 mg total) by mouth 2 (two) times daily as needed for heartburn or indigestion. 06/19/19   Martinique, Peter M, MD  fluticasone (FLONASE) 50 MCG/ACT nasal spray Place 1 spray into both nostrils daily as needed (seasonal allergies).     [provider]  Ketotifen Fumarate (ALLERGY EYE DROPS OP) Place 1 drop into both eyes daily as needed (allergies).    [provider]  loratadine (CLARITIN) 10 MG tablet Take 10 mg by mouth as needed for allergies.     [provider]  Melatonin 3 MG TABS Take 6 mg by mouth at bedtime as needed (sleep aid).     [provider]  midodrine (PROAMATINE) 5 MG tablet Take 5 mg by mouth as needed.     [provider]  polyethylene glycol powder (GLYCOLAX/MIRALAX) 17 GM/SCOOP powder Take 1 Container by mouth once.    [provider]  Wheat Dextrin (BENEFIBER PO) Take 30 mLs by mouth daily as needed (Constipation).     [provider]  XARELTO 20 MG TABS tablet TAKE 1 TABLET (20 MG TOTAL) BY MOUTH DAILY WITH SUPPER. 11/16/19   Martinique, Peter M, MD    Family History Family History  Problem Relation Age of Onset  . Breast cancer Mother   . Colon cancer Mother   . CAD Father   . Heart attack Paternal Grandmother   . Colon cancer Brother   . Esophageal cancer Brother   . Liver cancer Brother   . Cancer Brother   . Leukemia Paternal Uncle   . Bone cancer Paternal Grandfather   . Pancreatic cancer Neg Hx   . Prostate cancer Neg Hx   . Rectal cancer Neg Hx   . Stomach cancer Neg Hx     Social History Social History   Tobacco Use  . Smoking status: Never Smoker  . Smokeless tobacco: Never Used  Vaping Use  . Vaping Use: Never used  Substance Use Topics  . Alcohol use: Yes    Alcohol/week: 3.0 standard drinks    Types: 3 Glasses of wine per week    Comment: red wine weekly  . Drug use: No     Allergies   Penicillins   Review of Systems As  stated in HPI otherwise negative   Physical Exam Triage Vital Signs ED Triage Vitals  Enc Vitals Group     BP 05/15/20 1200 124/74     Pulse Rate 05/15/20 1200 92     Resp 05/15/20 1200 18     Temp 05/15/20 1241 99.2 F (37.3 C)     Temp Source 05/15/20 1241 Oral     SpO2 05/15/20 1200 100 %     Weight --      Height --      Head Circumference --      Peak Flow --  Pain Score 05/15/20 1159 0     Pain Loc --      Pain Edu? --      Excl. in [place]? --    No data found.  Updated Vital Signs BP 124/74 (BP Location: Right Arm)   Pulse 92   Temp 99.2 F (37.3 C) (Oral)   Resp 18   SpO2 100%   Visual Acuity Right Eye Distance:   Left Eye Distance:   Bilateral Distance:    Right Eye Near:   Left Eye Near:    Bilateral Near:     Physical Exam Constitutional:      General: He is not in acute distress.    Appearance: Normal appearance.  HENT:     Right Ear: Tympanic membrane normal.     Left Ear: Tympanic membrane normal.     Nose: Congestion and rhinorrhea present.     Mouth/Throat:     Mouth: Mucous membranes are moist.     Pharynx: Oropharynx is clear. No oropharyngeal exudate or posterior oropharyngeal erythema.  Cardiovascular:     Rate and Rhythm: Normal rate and regular rhythm.     Comments: No lower extremity edema Pulmonary:     Effort: Pulmonary effort is normal.     Breath sounds: No wheezing, rhonchi or rales.     Comments: Slightly diminished lower lobes bilaterally Abdominal:     General: Abdomen is flat. Bowel sounds are normal.     Palpations: Abdomen is soft.  Musculoskeletal:        General: Normal range of motion.     Cervical back: Normal range of motion and neck supple. No rigidity.  Lymphadenopathy:     Cervical: No cervical adenopathy.  Skin:    General: Skin is warm.     Comments: Clammy  Neurological:     General: No focal deficit present.     Mental Status: He is alert and oriented to person, place, and time.  Psychiatric:         Mood and Affect: Mood normal.        Behavior: Behavior normal.      UC Treatments / Results  Labs (all labs ordered are listed, but only abnormal results are displayed) Labs Reviewed  RESP PANEL BY RT PCR (RSV, FLU A&B, COVID)    EKG   Radiology DG Chest 2 View  Result Date: 05/15/2020 CLINICAL DATA:  Fever, cough. EXAM: CHEST - 2 VIEW COMPARISON:  October 15, 2018. FINDINGS: The heart size and mediastinal contours are within normal limits. Both lungs are clear. The visualized skeletal structures are unremarkable. IMPRESSION: No active cardiopulmonary disease. Electronically Signed   By: Marijo Conception M.D.   On: 05/15/2020 13:54    Procedures Procedures (including critical care time)  Medications Ordered in UC Medications - No data to display  Initial Impression / Assessment and Plan / UC Course  I have reviewed the triage vital signs and the nursing notes.  Pertinent labs & imaging results that were available during my care of the patient were reviewed by me and considered in my medical decision making (see chart for details).  Acute viral respiratory illness -No evidence of pneumonia on chest x-ray.  Covid PCR done earlier this week at [facility] reportedly negative.  VSS and patient is nontoxic-appearing -Respiratory viral panel obtained to assess for influenza. -Symptomatic care with guaifenesin and allergy medication patient already taking.  Okay to add Flonase.  Push fluids -Discussed use of Tylenol for  fever reduction and myalgia relief -Long discussion had regarding current symptoms and plan of care should symptoms worsen.  Patient agrees to go to the emergency department if he experiences any worsening symptoms, chest pain or shortness of breath -Asked that he follow-up with his PCP early next week  Reviewed expections re: course of current medical issues. Questions answered. Outlined signs and symptoms indicating need for more acute intervention. Pt  verbalized understanding. AVS given   Final Clinical Impressions(s) / UC Diagnoses   Final diagnoses:  Viral respiratory illness     Discharge Instructions     Your chest x-ray does not show any evidence of pneumonia.  I do not see any indication for antibiotics at this time.  We have tested you for flu as well as rescreened you for Covid 19 just in case.  These results will be available to you on your MyChart in the next 2 to 3 days.  In the meantime you can continue your guaifenesin and allergy medication.  Adding Flonase may be helpful.  You can take Tylenol every 4 hours as needed and/or Motrin 600 mg every 8 hours as needed for fever.  Please call Dr. Harvie Bridge office to schedule follow-up for early next week.  You will need to go to the emergency department for any worsening symptoms.    ED Prescriptions    None     PDMP not reviewed this encounter.   Rudolpho Sevin, NP 05/15/20 1517

## 2020-05-15 NOTE — Discharge Instructions (Addendum)
Your chest x-ray does not show any evidence of pneumonia.  I do not see any indication for antibiotics at this time.  We have tested you for flu as well as rescreened you for Covid 19 just in case.  These results will be available to you on your MyChart in the next 2 to 3 days.  In the meantime you can continue your guaifenesin and allergy medication.  Adding Flonase may be helpful.  You can take Tylenol every 4 hours as needed and/or Motrin 600 mg every 8 hours as needed for fever.  Please call Dr. Harvie Bridge office to schedule follow-up for early next week.  You will need to go to the emergency department for any worsening symptoms.

## 2020-05-15 NOTE — ED Triage Notes (Signed)
Pt presents with congestions, chills, and cough xs 6 days. States started with nasal congestion and went into chest with productive cough.   States taken OTC sudafed with some relief. States went to [facility] center for COVID test with negative rapid and PCR test.

## 2020-05-20 DIAGNOSIS — J210 Acute bronchiolitis due to respiratory syncytial virus: Secondary | ICD-10-CM | POA: Diagnosis not present

## 2020-05-22 ENCOUNTER — Other Ambulatory Visit: Payer: Self-pay | Admitting: Cardiology

## 2020-05-22 NOTE — Telephone Encounter (Signed)
39m 70kg 1.37scr 11/29/19 ccr 44 Lovw/klein 03/06/20 Pt requests a refill of the xarelto 20mg  when he only qualifies for the 15mg  will route to pharmd pool for further review

## 2020-05-23 ENCOUNTER — Other Ambulatory Visit: Payer: Self-pay | Admitting: Pharmacist Clinician (PhC)/ Clinical Pharmacy Specialist

## 2020-05-23 MED ORDER — RIVAROXABAN 15 MG PO TABS
15.0000 mg | ORAL_TABLET | Freq: Every day | ORAL | 1 refills | Status: DC
Start: 2020-05-23 — End: 2020-08-31

## 2020-05-23 NOTE — Telephone Encounter (Signed)
Patient with SCr 1.24-1.37 over last 6 months.  This lowers CrCl to < 50.  Will decrease Xarelto to 15 mg once daily.  Reviewed information with patient.  Answered all questions.

## 2020-06-24 DIAGNOSIS — G4752 REM sleep behavior disorder: Secondary | ICD-10-CM | POA: Diagnosis not present

## 2020-07-02 ENCOUNTER — Ambulatory Visit (INDEPENDENT_AMBULATORY_CARE_PROVIDER_SITE_OTHER): Payer: Medicare Other | Admitting: Internal Medicine

## 2020-07-02 ENCOUNTER — Other Ambulatory Visit: Payer: Self-pay

## 2020-07-02 ENCOUNTER — Encounter: Payer: Self-pay | Admitting: Internal Medicine

## 2020-07-02 VITALS — BP 102/70 | HR 71 | Ht 72.0 in | Wt 158.8 lb

## 2020-07-02 DIAGNOSIS — I25110 Atherosclerotic heart disease of native coronary artery with unstable angina pectoris: Secondary | ICD-10-CM

## 2020-07-02 DIAGNOSIS — I951 Orthostatic hypotension: Secondary | ICD-10-CM | POA: Diagnosis not present

## 2020-07-02 DIAGNOSIS — I480 Paroxysmal atrial fibrillation: Secondary | ICD-10-CM

## 2020-07-02 DIAGNOSIS — R002 Palpitations: Secondary | ICD-10-CM | POA: Diagnosis not present

## 2020-07-02 NOTE — Patient Instructions (Signed)

## 2020-07-02 NOTE — Progress Notes (Signed)
Patient Care Team: Antony Contras, MD as PCP - General (Family Medicine) Martinique, Peter M, MD as PCP - Cardiology (Cardiology)   HPI  Jerry Barr is a 79 y.o. male seen in follow-up for atrial fibrillation and orthostatic hypotension with little heart rate response concerning for more systemic autonomic insufficiency.  Improved with discontinuation of amiodarone  Indeed, as as noted below your is also improvement in heart rate with orthostatic blood pressure fall.  Significantly less symptomatic. Takes Primatene in the morning. Able to shower now in the morning. Uses his abdominal binder occasionally   History of remote coronary artery disease with stentingLAD 2011 circumflex 2020;  no symptoms of ischemia  Anticoagulation with apixaban without bleeding    DATE TEST EF   10/17 Echo 50-55%%   12/18 Echo 45-50%   12/20 LHC  LAD Stents patent Cx-stent patent; RCA patent   Date Cr K TSH LFTs Hgb  5/21 1.26 4.6 1.19 20 13.9          Thromboembolic risk factors ( age -30, HTN-1, Vasc disease -1) for a CHADSVASc Score of>=4    Records and Results Reviewed   Past Medical History:  Diagnosis Date  . Allergy   . Aortic atherosclerosis ([place])   . Atrial fibrillation ([place])   . BPH (benign prostatic hyperplasia)   . CAD (coronary artery disease)   . Cancer (HCC)    basal cell ca - face, nose and right leg  . Clotting disorder (HCC)    a fib hx - Xarelto  . Coronary artery disease    Promus drug-eluting stent to a 99% mid LAD, 40-50% narrowing in the distal LAD with mild to moderate disease in the circ and RCA b. 5/6 PCI/DESx1 to mLcx  . Dyslipidemia   . ED (erectile dysfunction)   . Hearing loss   . Hyperplastic colon polyp   . Hypoglycemia   . Orthostatic hypotension   . REM sleep behavior disorder    not tested    Past Surgical History:  Procedure Laterality Date  . CARDIOVERSION N/A 04/23/2016   Procedure: CARDIOVERSION;  Surgeon: [name], MD;  Location: [facility] ENDOSCOPY;  Service: Cardiovascular;  Laterality: N/A;  . COLONOSCOPY    . CORONARY ANGIOPLASTY WITH STENT PLACEMENT    . CORONARY STENT INTERVENTION N/A 11/15/2018   Procedure: CORONARY STENT INTERVENTION;  Surgeon: Burnell Blanks, MD;  Location: [place] CV LAB;  Service: Cardiovascular;  Laterality: N/A;  . LEFT HEART CATH AND CORONARY ANGIOGRAPHY N/A 11/15/2018   Procedure: LEFT HEART CATH AND CORONARY ANGIOGRAPHY;  Surgeon: Burnell Blanks, MD;  Location: [place] CV LAB;  Service: Cardiovascular;  Laterality: N/A;  . LEFT HEART CATH AND CORONARY ANGIOGRAPHY N/A 06/25/2019   Procedure: LEFT HEART CATH AND CORONARY ANGIOGRAPHY;  Surgeon: Martinique, Peter M, MD;  Location: [place] CV LAB;  Service: Cardiovascular;  Laterality: N/A;  . POLYPECTOMY    . TEE WITHOUT CARDIOVERSION N/A 04/23/2016   Procedure: TRANSESOPHAGEAL ECHOCARDIOGRAM (TEE);  Surgeon: [name], MD;  Location: [facility] ENDOSCOPY;  Service: Cardiovascular;  Laterality: N/A;  . UPPER GASTROINTESTINAL ENDOSCOPY      Current Meds  Medication Sig  . acetaminophen (TYLENOL) 500 MG tablet Take 500 mg by mouth every 6 (six) hours as needed for headache (pain).  Marland Kitchen atorvastatin (LIPITOR) 20 MG tablet TAKE 1 TABLET BY MOUTH EVERY DAY  . Cholecalciferol (VITAMIN D-3) 1000 UNITS CAPS Take 1,000 Units by mouth daily.  . famotidine (  PEPCID) 20 MG tablet Take 1 tablet (20 mg total) by mouth 2 (two) times daily as needed for heartburn or indigestion.  . fluticasone (FLONASE) 50 MCG/ACT nasal spray Place 1 spray into both nostrils daily as needed (seasonal allergies).   . Ketotifen Fumarate (ALLERGY EYE DROPS OP) Place 1 drop into both eyes daily as needed (allergies).  . loratadine (CLARITIN) 10 MG tablet Take 10 mg by mouth as needed for allergies.   . Melatonin 3 MG TABS Take 6 mg by mouth at bedtime as needed (sleep aid).   . midodrine (PROAMATINE) 5 MG tablet Take 5 mg by mouth as needed.    . polyethylene glycol powder (GLYCOLAX/MIRALAX) 17 GM/SCOOP powder Take 1 Container by mouth once.  . Rivaroxaban (XARELTO) 15 MG TABS tablet Take 1 tablet (15 mg total) by mouth daily with supper.  . Wheat Dextrin (BENEFIBER PO) Take 30 mLs by mouth daily as needed (Constipation).    Current Facility-Administered Medications for the 07/02/20 encounter (Office Visit) with Deboraha Sprang, MD  Medication  . sodium chloride flush (NS) 0.9 % injection 3 mL    Allergies  Allergen Reactions  . Penicillins Rash    Did it involve swelling of the face/tongue/throat, SOB, or low BP? No Did it involve sudden or severe rash/hives, skin peeling, or any reaction on the inside of your mouth or nose? No Did you need to seek medical attention at a hospital or doctor's office? No When did it last happen?50+ years  If all above answers are "NO", may proceed with cephalosporin use.       Review of Systems negative except from HPI and PMH  Physical Exam BP 102/70 (BP Location: Left Arm, Patient Position: Sitting, Cuff Size: Normal)   Pulse 71   Ht 6' (1.829 m)   Wt 158 lb 12.8 oz (72 kg)   SpO2 97%   BMI 21.54 kg/m  Well developed and well nourished in no acute distress HENT normal E scleral and icterus clear Neck Supple JVP flat; carotids brisk and full Clear to ausculation  Regular rate and rhythm, no murmurs gallops or rub Soft with active bowel sounds No clubbing cyanosis  Edema Alert and oriented, grossly normal motor and sensory function Skin Warm and Dry  ECG sinus @ 71 16/09/39  CrCl cannot be calculated (Patient's most recent lab result is older than the maximum 21 days allowed.).   Assessment and  Plan Orthostatic intolerance  Coronary artery disease with prior stenting  Atrial fibrillation  Palpitations  No symptomatic atrial fibrillation. On anticoagulation without bleeding.  No symptoms of ischemia  Orthostatic intolerance is much improved off the  amiodarone. He is using ProAmatine in the mornings. Now able to shower in the mornings. Use abdominal binder occasionally. Reviewed the physiology. Notably today on orthostatic vital signs, significant heart rate increase was noted. This makes the issue of systemic dysautonomia less likely. Low voltage raises the possibility of amyloid. Will review with Dr. Shirlee More.  Echo report reviewed-- no LVH and LVEDP normal at cath 12/20  Reasonable to do TTR scan and see if worth pursuing          Current medicines are reviewed at length with the patient today .  The patient does not  have concerns regarding medicines.

## 2020-07-23 ENCOUNTER — Other Ambulatory Visit: Payer: Self-pay | Admitting: Adult Health

## 2020-08-04 ENCOUNTER — Other Ambulatory Visit: Payer: Self-pay

## 2020-08-04 ENCOUNTER — Ambulatory Visit (INDEPENDENT_AMBULATORY_CARE_PROVIDER_SITE_OTHER): Payer: Medicare Other | Admitting: Dermatology

## 2020-08-04 DIAGNOSIS — I2510 Atherosclerotic heart disease of native coronary artery without angina pectoris: Secondary | ICD-10-CM | POA: Diagnosis not present

## 2020-08-04 DIAGNOSIS — L309 Dermatitis, unspecified: Secondary | ICD-10-CM

## 2020-08-04 DIAGNOSIS — L570 Actinic keratosis: Secondary | ICD-10-CM

## 2020-08-04 MED ORDER — BETAMETHASONE VALERATE 0.12 % EX FOAM: 1.0000 "application " | Freq: Two times a day (BID) | CUTANEOUS | 2 refills | Status: DC

## 2020-08-05 ENCOUNTER — Telehealth: Payer: Self-pay | Admitting: Dermatology

## 2020-08-05 ENCOUNTER — Encounter: Payer: Self-pay | Admitting: Gastroenterology

## 2020-08-05 ENCOUNTER — Telehealth: Payer: Self-pay

## 2020-08-05 ENCOUNTER — Other Ambulatory Visit (INDEPENDENT_AMBULATORY_CARE_PROVIDER_SITE_OTHER): Payer: Medicare Other

## 2020-08-05 ENCOUNTER — Ambulatory Visit (INDEPENDENT_AMBULATORY_CARE_PROVIDER_SITE_OTHER): Payer: Medicare Other | Admitting: Gastroenterology

## 2020-08-05 VITALS — BP 100/60 | HR 86 | Ht 72.0 in | Wt 159.0 lb

## 2020-08-05 DIAGNOSIS — K5904 Chronic idiopathic constipation: Secondary | ICD-10-CM | POA: Diagnosis not present

## 2020-08-05 DIAGNOSIS — K219 Gastro-esophageal reflux disease without esophagitis: Secondary | ICD-10-CM | POA: Diagnosis not present

## 2020-08-05 DIAGNOSIS — R1031 Right lower quadrant pain: Secondary | ICD-10-CM | POA: Diagnosis not present

## 2020-08-05 LAB — CBC WITH DIFFERENTIAL/PLATELET
Basophils Absolute: 0 10*3/uL (ref 0.0–0.1)
Basophils Relative: 0.4 % (ref 0.0–3.0)
Eosinophils Absolute: 0.2 10*3/uL (ref 0.0–0.7)
Eosinophils Relative: 2.7 % (ref 0.0–5.0)
HCT: 42.8 % (ref 39.0–52.0)
Hemoglobin: 14.5 g/dL (ref 13.0–17.0)
Lymphocytes Relative: 16.5 % (ref 12.0–46.0)
Lymphs Abs: 1 10*3/uL (ref 0.7–4.0)
MCHC: 33.8 g/dL (ref 30.0–36.0)
MCV: 90.9 fl (ref 78.0–100.0)
Monocytes Absolute: 0.6 10*3/uL (ref 0.1–1.0)
Monocytes Relative: 9.2 % (ref 3.0–12.0)
Neutro Abs: 4.4 10*3/uL (ref 1.4–7.7)
Neutrophils Relative %: 71.2 % (ref 43.0–77.0)
Platelets: 199 10*3/uL (ref 150.0–400.0)
RBC: 4.71 Mil/uL (ref 4.22–5.81)
RDW: 14.6 % (ref 11.5–15.5)
WBC: 6.1 10*3/uL (ref 4.0–10.5)

## 2020-08-05 LAB — COMPREHENSIVE METABOLIC PANEL
ALT: 17 U/L (ref 0–53)
AST: 18 U/L (ref 0–37)
Albumin: 4.2 g/dL (ref 3.5–5.2)
Alkaline Phosphatase: 71 U/L (ref 39–117)
BUN: 26 mg/dL — ABNORMAL HIGH (ref 6–23)
CO2: 30 mEq/L (ref 19–32)
Calcium: 9 mg/dL (ref 8.4–10.5)
Chloride: 106 mEq/L (ref 96–112)
Creatinine, Ser: 0.9 mg/dL (ref 0.40–1.50)
GFR: 81.52 mL/min (ref >60.00)
Glucose, Bld: 78 mg/dL (ref 70–99)
Potassium: 4.3 mEq/L (ref 3.5–5.1)
Sodium: 139 mEq/L (ref 135–145)
Total Bilirubin: 0.7 mg/dL (ref 0.2–1.2)
Total Protein: 6.8 g/dL (ref 6.0–8.3)

## 2020-08-05 LAB — LIPASE: Lipase: 31 U/L (ref 11.0–59.0)

## 2020-08-05 NOTE — Telephone Encounter (Signed)
Per patient we sent Betamethasone to [facility] because of cash pay price with good RX but it was over $400. If alternate medication is sent please [facility] to [facility] [address].

## 2020-08-05 NOTE — Telephone Encounter (Signed)
Left message to please call back. °

## 2020-08-05 NOTE — Telephone Encounter (Signed)
-----   Message from Ladene Artist, MD sent at 08/05/2020 12:23 PM EST ----- Normal except minimally elevated BUN suggesting mild dehydration Increase daily water, juice intake (non alcoholic, non caffeinated beverages) at least 6 glasses daily

## 2020-08-05 NOTE — Telephone Encounter (Signed)
Attempted to call patient, unable to leave message for patient to return our call because voicemail box was full.

## 2020-08-05 NOTE — Patient Instructions (Addendum)
Your provider has requested that you go to the basement level for lab work before leaving today. Press "B" on the elevator. The lab is located at the first door on the left as you exit the elevator.  You have been scheduled for a CT scan of the abdomen and pelvis at [facility] ([address] You are scheduled on 08/20/20  at 9:20am. You should arrive 15 minutes prior to your appointment time for registration.  Please pick up 2 bottles of contrast from [facility] at least 3 days prior to your scan. The solution may taste better if refrigerated, but do NOT add ice or any other liquid to this solution. Shake well before drinking.   Please follow the written instructions below on the day of your exam:   1) Do not eat anything after 5:20am (4 hours prior to your test)   2) Drink 1 bottle of contrast @ 7:20am (2 hours prior to your exam)  Remember to shake well before drinking and do NOT pour over ice.     Drink 1 bottle of contrast @ 8:20am (1 hour prior to your exam)   You may take any medications as prescribed with a small amount of water, if necessary. If you take any of the following medications: METFORMIN, GLUCOPHAGE, GLUCOVANCE, AVANDAMET, RIOMET, FORTAMET, [place] MET, JANUMET, GLUMETZA or METAGLIP, you MAY be asked to HOLD this medication 48 hours AFTER the exam.   The purpose of you drinking the oral contrast is to aid in the visualization of your intestinal tract. The contrast solution may cause some diarrhea. Depending on your individual set of symptoms, you may also receive an intravenous injection of x-ray contrast/dye. Plan on being at [facility] for 45 minutes or longer, depending on the type of exam you are having performed.   If you have any questions regarding your exam or if you need to reschedule, you may call [facility] at [phone].  Thank you for choosing me and [facility].  Pricilla Riffle. Dagoberto Ligas., MD., [facility]

## 2020-08-05 NOTE — Progress Notes (Signed)
    History of Present Illness: This is a 80 year old complaining of intermittent RLQ pain for the past several weeks.  He states it started about 6 to 8 weeks ago described as an achy sensation in his right lower quadrant.  Lasted for about a week then abated and returned about 1 week ago.  Does not change with meals or bowel movements.  He has ongoing problems with constipation which is well managed with fiber supplements and diet.  He notes gas and bloating from certain foods and beverages so he avoids them.  Infrequent and mild reflux symptoms relieved with Pepcid as needed.  Denies weight loss, diarrhea, change in stool caliber, melena, hematochezia, nausea, vomiting, dysphagia, chest pain.   Colonoscopy 09/2017 - Two 6 to 7 mm polyps in the ascending colon, removed with a cold snare. Resected and retrieved. ( 1 TA, 1 HP) - Moderate diverticulosis in the left colon. There was no evidence of diverticular bleeding. - Internal hemorrhoids. - The examination was otherwise normal on direct and retroflexion views.   Current Medications, Allergies, Past Medical History, Past Surgical History, Family History and Social History were reviewed in [employer] record.   Physical Exam: General: Well developed, well nourished, no acute distress Head: Normocephalic and atraumatic Eyes:  sclerae anicteric, EOMI Ears: Normal auditory acuity Mouth: Not examined, mask on during Covid-19 pandemic Lungs: Clear throughout to auscultation Heart: Regular rate and rhythm; no murmurs, rubs or bruits Abdomen: Soft, mild focal RLQ tenderness near umbilicus and non distended. No masses, hepatosplenomegaly or hernias noted. Normal Bowel sounds Rectal: Not done Musculoskeletal: Symmetrical with no gross deformities  Pulses:  Normal pulses noted Extremities: No clubbing, cyanosis, edema or deformities noted Neurological: Alert oriented x 4, grossly nonfocal Psychological:  Alert and  cooperative. Normal mood and affect   Assessment and Recommendations:  1.  Intermittent focal RLQ pain and tenderness. R/O intraabdominal process.  CMP, CBC, lipase today.  Schedule CT AP.  Trial of Tylenol or Advil for possible musculoskeletal symptoms.  Further plans pending above findings.  2.  Chronic idiopathic constipation.  Continue high-fiber diet with Benefiber daily and MiraLAX daily as needed.  3. Personal history of adenomatous colon polyps. Family history of colon cancer.  Most recent colonoscopy as above.  No plans for future surveillance colonoscopies due to age.  4.  GERD.  Follow antireflux measures and continue famotidine 20 mg p.o. twice daily as needed.  5. Afib on Xarelto.

## 2020-08-08 NOTE — Telephone Encounter (Signed)
Phone call to patient to inform him that I've spoke to his Pharmacy and he's now able to call and get the medication at a discounted price.  Patient aware and states he will contact the Pharmacy.

## 2020-08-08 NOTE — Telephone Encounter (Signed)
Phone call to patient's Pharmacy to see why someone told the patient that his medication would be over $400 when on Good Rx the medication is no more than $60?  Per Pharmacist she's not sure who told the patient that because there's no Good Rx coupon on the patient's profile.  I gave the Pharmacist the Good Rx coupon information to be added to his profile.  Per Pharmacist have the patient call them and they will be able to give him the updated price of the medication.

## 2020-08-16 ENCOUNTER — Telehealth: Payer: Self-pay | Admitting: Student

## 2020-08-16 NOTE — Telephone Encounter (Addendum)
    The patient called the after hours line reporting he has experienced episodes of his heart fluttering the past two days. Denies any associated chest pain or dyspnea. His HR can go into the 110's to 120's with activity but has been in the 60's to 70's at rest. He is on anticoagulation but is not on AV nodal blocking agents due to significant orthostatic hypotension. Was orthostatic this AM and took Midodrine 5mg  along with consuming salt water and wearing his abdominal binder. Denies any associated dizziness or presyncope.   I encouraged the patient to continue using Midodrine as needed for orthostasis and he is aware he can take this up to 3 times daily.  He is concerned he might be back in atrial fibrillation and I will send a note to the [facility] to see if they can evaluate the patient next week. He was previously intolerant to Amiodarone. He will continue to monitor his BP/HR and is aware to seek emergent evaluation in the interim if he develops any worsening symptoms.   Signed, Erma Heritage, PA-C 08/16/2020, 10:17 AM Pager: [phone]

## 2020-08-18 ENCOUNTER — Other Ambulatory Visit: Payer: Self-pay

## 2020-08-18 ENCOUNTER — Ambulatory Visit (HOSPITAL_COMMUNITY)
Admission: RE | Admit: 2020-08-18 | Discharge: 2020-08-18 | Disposition: A | Discharge status: Home / Self Care | Payer: Medicare Other | Source: Ambulatory Visit | Attending: Physician Assistant | Admitting: Physician Assistant

## 2020-08-18 ENCOUNTER — Ambulatory Visit (HOSPITAL_COMMUNITY): Payer: Medicare Other | Admitting: Physician Assistant

## 2020-08-18 VITALS — BP 106/68 | HR 102 | Ht 72.0 in | Wt 156.4 lb

## 2020-08-18 DIAGNOSIS — Z79899 Other long term (current) drug therapy: Secondary | ICD-10-CM | POA: Diagnosis not present

## 2020-08-18 DIAGNOSIS — I951 Orthostatic hypotension: Secondary | ICD-10-CM | POA: Insufficient documentation

## 2020-08-18 DIAGNOSIS — E785 Hyperlipidemia, unspecified: Secondary | ICD-10-CM | POA: Insufficient documentation

## 2020-08-18 DIAGNOSIS — N400 Enlarged prostate without lower urinary tract symptoms: Secondary | ICD-10-CM | POA: Diagnosis not present

## 2020-08-18 DIAGNOSIS — D6869 Other thrombophilia: Secondary | ICD-10-CM

## 2020-08-18 DIAGNOSIS — Z955 Presence of coronary angioplasty implant and graft: Secondary | ICD-10-CM | POA: Diagnosis not present

## 2020-08-18 DIAGNOSIS — Z8719 Personal history of other diseases of the digestive system: Secondary | ICD-10-CM | POA: Insufficient documentation

## 2020-08-18 DIAGNOSIS — I2510 Atherosclerotic heart disease of native coronary artery without angina pectoris: Secondary | ICD-10-CM | POA: Diagnosis not present

## 2020-08-18 DIAGNOSIS — Z7901 Long term (current) use of anticoagulants: Secondary | ICD-10-CM | POA: Diagnosis not present

## 2020-08-18 DIAGNOSIS — I4819 Other persistent atrial fibrillation: Secondary | ICD-10-CM | POA: Diagnosis not present

## 2020-08-18 DIAGNOSIS — Z880 Allergy status to penicillin: Secondary | ICD-10-CM | POA: Insufficient documentation

## 2020-08-18 DIAGNOSIS — Z8249 Family history of ischemic heart disease and other diseases of the circulatory system: Secondary | ICD-10-CM | POA: Insufficient documentation

## 2020-08-18 LAB — BASIC METABOLIC PANEL
Anion gap: 5 (ref 5–15)
BUN: 22 mg/dL (ref 8–23)
CO2: 26 mmol/L (ref 22–32)
Calcium: 8.5 mg/dL — ABNORMAL LOW (ref 8.9–10.3)
Chloride: 106 mmol/L (ref 98–111)
Creatinine, Ser: 1.06 mg/dL (ref 0.61–1.24)
GFR, Estimated: >60 mL/min (ref >60)
Glucose, Bld: 84 mg/dL (ref 70–99)
Potassium: 4.6 mmol/L (ref 3.5–5.1)
Sodium: 137 mmol/L (ref 135–145)

## 2020-08-18 LAB — CBC
HCT: 46.1 % (ref 39.0–52.0)
Hemoglobin: 15 g/dL (ref 13.0–17.0)
MCH: 30.2 pg (ref 26.0–34.0)
MCHC: 32.5 g/dL (ref 30.0–36.0)
MCV: 92.9 fL (ref 80.0–100.0)
Platelets: 228 10*3/uL (ref 150–400)
RBC: 4.96 MIL/uL (ref 4.22–5.81)
RDW: 13.9 % (ref 11.5–15.5)
WBC: 8.4 10*3/uL (ref 4.0–10.5)
nRBC: 0 % (ref 0.0–0.2)

## 2020-08-18 NOTE — Patient Instructions (Signed)
Cardioversion scheduled for Thursday, February 10th  - Arrive at the [facility] and go to admitting at 12PM  - Do not eat or drink anything after midnight the night prior to your procedure.  - Take all your morning medication (except diabetic medications) with a sip of water prior to arrival.  - You will not be able to drive home after your procedure.  - Do NOT miss any doses of your blood thinner - if you should miss a dose please notify our office immediately.  - If you feel as if you go back into normal rhythm prior to scheduled cardioversion, please notify our office immediately. If your procedure is canceled in the cardioversion suite you will be charged a cancellation fee.

## 2020-08-18 NOTE — Progress Notes (Signed)
Primary Care Physician: Antony Contras, MD Primary Cardiologist: Dr Martinique Primary Electrophysiologist: Dr Caryl Comes Referring Physician: [facility]   Jerry Barr is a 80 y.o. male with a history of paroxysmal atrial fibrillation, CAD, BPH, HLD, and chronic hypotension who presents for follow up in the [facility]. He is on Xarelto for a CHADS2VASC score of 3. Patient seen by Dr Martinique 06/20/19 with symptoms of exertional neck and shoulder pain similar to his previous anginal symptoms. LHC 06/25/19 showed nonobstructive disease with patent stents. His amiodarone was discontinued 2/2 side effects with autonomic dysfunction. He was in his usual health until 08/15/20 when he started having symptoms of fatigue and intermittent elevated heart rates. He is back in afib today. There were no specific triggers that he could identify.   Today, he denies symptoms of chest pain, shortness of breath, orthopnea, PND, lower extremity edema, syncope, snoring, daytime somnolence, bleeding, or neurologic sequela. The patient is tolerating medications without difficulties and is otherwise without complaint today.    Atrial Fibrillation Risk Factors:  he does not have symptoms or diagnosis of sleep apnea. he does not have a history of alcohol use. The patient does not have a history of early familial atrial fibrillation or other arrhythmias.  he has a BMI of Body mass index is 21.21 kg/m.Marland Kitchen Filed Weights   08/18/20 1138  Weight: 70.9 kg    Family History  Problem Relation Age of Onset  . Breast cancer Mother   . Colon cancer Mother   . CAD Father   . Heart attack Paternal Grandmother   . Colon cancer Brother   . Esophageal cancer Brother   . Liver cancer Brother   . Cancer Brother   . Leukemia Paternal Uncle   . Bone cancer Paternal Grandfather   . Pancreatic cancer Neg Hx   . Prostate cancer Neg Hx   . Rectal cancer Neg Hx   . Stomach cancer Neg Hx      Atrial  Fibrillation Management history:  Previous antiarrhythmic drugs: amiodarone  Previous cardioversions: 2017, 10/15/18 Previous ablations: none CHADS2VASC score: 3 Anticoagulation history: Xarelto    Past Medical History:  Diagnosis Date  . Allergy   . Aortic atherosclerosis ([place])   . Atrial fibrillation ([place])   . BPH (benign prostatic hyperplasia)   . CAD (coronary artery disease)   . Cancer (HCC)    basal cell ca - face, nose and right leg  . Clotting disorder (HCC)    a fib hx - Xarelto  . Coronary artery disease    Promus drug-eluting stent to a 99% mid LAD, 40-50% narrowing in the distal LAD with mild to moderate disease in the circ and RCA b. 5/6 PCI/DESx1 to mLcx  . Dyslipidemia   . ED (erectile dysfunction)   . Hearing loss   . Hyperplastic colon polyp   . Hypoglycemia   . Orthostatic hypotension   . REM sleep behavior disorder    not tested   Past Surgical History:  Procedure Laterality Date  . CARDIOVERSION N/A 04/23/2016   Procedure: CARDIOVERSION;  Surgeon: [name], MD;  Location: [facility] ENDOSCOPY;  Service: Cardiovascular;  Laterality: N/A;  . COLONOSCOPY    . CORONARY ANGIOPLASTY WITH STENT PLACEMENT    . CORONARY STENT INTERVENTION N/A 11/15/2018   Procedure: CORONARY STENT INTERVENTION;  Surgeon: Burnell Blanks, MD;  Location: [place] CV LAB;  Service: Cardiovascular;  Laterality: N/A;  . LEFT HEART CATH AND CORONARY ANGIOGRAPHY  N/A 11/15/2018   Procedure: LEFT HEART CATH AND CORONARY ANGIOGRAPHY;  Surgeon: Burnell Blanks, MD;  Location: [place] CV LAB;  Service: Cardiovascular;  Laterality: N/A;  . LEFT HEART CATH AND CORONARY ANGIOGRAPHY N/A 06/25/2019   Procedure: LEFT HEART CATH AND CORONARY ANGIOGRAPHY;  Surgeon: Martinique, Peter M, MD;  Location: [place] CV LAB;  Service: Cardiovascular;  Laterality: N/A;  . POLYPECTOMY    . TEE WITHOUT CARDIOVERSION N/A 04/23/2016   Procedure: TRANSESOPHAGEAL ECHOCARDIOGRAM (TEE);  Surgeon: [name], MD;  Location: [facility] ENDOSCOPY;  Service: Cardiovascular;  Laterality: N/A;  . UPPER GASTROINTESTINAL ENDOSCOPY      Current Outpatient Medications  Medication Sig Dispense Refill  . acetaminophen (TYLENOL) 500 MG tablet Take 500 mg by mouth every 6 (six) hours as needed for headache (pain).    Marland Kitchen atorvastatin (LIPITOR) 20 MG tablet TAKE 1 TABLET BY MOUTH EVERY DAY 90 tablet 3  . Cholecalciferol (VITAMIN D-3) 1000 UNITS CAPS Take 1,000 Units by mouth daily.    . famotidine (PEPCID) 20 MG tablet Take 1 tablet (20 mg total) by mouth 2 (two) times daily as needed for heartburn or indigestion.    . fluticasone (FLONASE) 50 MCG/ACT nasal spray Place 1 spray into both nostrils daily as needed (seasonal allergies).     . loratadine (CLARITIN) 10 MG tablet Take 10 mg by mouth daily.    . Melatonin 3 MG TABS Take 6 mg by mouth every evening.    . midodrine (PROAMATINE) 5 MG tablet Take 5 mg by mouth as needed.    Marland Kitchen POLYETHYLENE GLYCOL 3350 PO Take 1 g by mouth as needed for mild constipation.    . Rivaroxaban (XARELTO) 15 MG TABS tablet Take 1 tablet (15 mg total) by mouth daily with supper. 90 tablet 1  . Wheat Dextrin (BENEFIBER PO) Take 30 mLs by mouth daily.     Current Facility-Administered Medications  Medication Dose Route Frequency Provider Last Rate Last Admin  . sodium chloride flush (NS) 0.9 % injection 3 mL  3 mL Intravenous Q12H Martinique, Peter M, MD        Allergies  Allergen Reactions  . Penicillins Rash    Did it involve swelling of the face/tongue/throat, SOB, or low BP? No Did it involve sudden or severe rash/hives, skin peeling, or any reaction on the inside of your mouth or nose? No Did you need to seek medical attention at a hospital or doctor's office? No When did it last happen?50+ years  If all above answers are "NO", may proceed with cephalosporin use.     Social History   Socioeconomic History  . Marital status: Married    Spouse name: Sheria Lang  .  Number of children: Not on file  . Years of education: Not on file  . Highest education level: Professional school degree (e.g., MD, DDS, DVM, JD)  Occupational History    Comment: retired  CE  Tobacco Use  . Smoking status: Never Smoker  . Smokeless tobacco: Never Used  Vaping Use  . Vaping Use: Never used  Substance and Sexual Activity  . Alcohol use: Yes    Alcohol/week: 4.0 standard drinks    Types: 4 Glasses of wine per week  . Drug use: No  . Sexual activity: Not Currently  Other Topics Concern  . Not on file  Social History Narrative   Lives with wife   Almost no caffeine   Social Determinants of Health   Financial Resource Strain:  Not on file  Food Insecurity: Not on file  Transportation Needs: Not on file  Physical Activity: Not on file  Stress: Not on file  Social Connections: Not on file  Intimate Partner Violence: Not on file     ROS- All systems are reviewed and negative except as per the HPI above.  Physical Exam: Vitals:   08/18/20 1138  BP: 106/68  Pulse: (!) 102  Weight: 70.9 kg  Height: 6' (1.829 m)    GEN- The patient is well appearing elderly male, alert and oriented x 3 today.   HEENT-head normocephalic, atraumatic, sclera clear, conjunctiva pink, hearing intact, trachea midline. Lungs- Clear to ausculation bilaterally, normal work of breathing Heart- irregular rate and rhythm, no murmurs, rubs or gallops  GI- soft, NT, ND, + BS Extremities- no clubbing, cyanosis, or edema MS- no significant deformity or atrophy Skin- no rash or lesion Psych- euthymic mood, full affect Neuro- strength and sensation are intact   Wt Readings from Last 3 Encounters:  08/18/20 70.9 kg  08/05/20 72.1 kg  07/02/20 72 kg    EKG today demonstrates  Afib  Vent. rate 102 BPM QRS duration 86 ms QT/QTc 328/427 ms  Echo 12/14/19 demonstrated  1. Left ventricular ejection fraction, by estimation, is 55 to 60%. The  left ventricle has normal function. The  left ventricle has no regional  wall motion abnormalities. Left ventricular diastolic parameters were  normal. The average left ventricular  global longitudinal strain is -20.5 %. The global longitudinal strain is  normal.  2. Right ventricular systolic function is normal. The right ventricular  size is normal. There is normal pulmonary artery systolic pressure. The  estimated right ventricular systolic pressure is 42.7 mmHg.  3. The mitral valve is normal in structure. Mild mitral valve  regurgitation. No evidence of mitral stenosis.  4. The aortic valve is normal in structure. Aortic valve regurgitation is  not visualized. No aortic stenosis is present.  5. The inferior vena cava is normal in size with greater than 50%  respiratory variability, suggesting right atrial pressure of 3 mmHg.   Epic records are reviewed at length today  Assessment and Plan:  1. Persistent atrial fibrillation Patient appears to be in persistent afib. We discussed therapeutic options today including DCCV, ablation, and AAD.  Given the infrequent nature of his afib, will plan for DCCV alone. Check cbc/bmet If he should have early return of afib, can consider rhythm control with ablation or dofetilide. Continue Xarelto 20 mg daily  This patients CHA2DS2-VASc Score and unadjusted Ischemic Stroke Rate (% per year) is equal to 3.2 % stroke rate/year from a score of 3  Above score calculated as 1 point each if present [CHF, HTN, DM, Vascular=MI/PAD/Aortic Plaque, Age if 65-74, or Male] Above score calculated as 2 points each if present [Age > 75, or Stroke/TIA/TE]  2. CAD S/p DES to LAD 2011 and mid cirfumflex 11/2018. Repeat LHC 06/25/19 showed nonobstructive disease and patent stents. Followed by Dr Martinique.  3. Orthostatic Hypotension On midodrine PRN, followed by Dr Caryl Comes. BP stable today.   Follow up in the AF clinic one week post DCCV and with Dr Martinique as scheduled.    [facility] [address] [place], [place] [address] [phone] 08/18/2020 12:01 PM

## 2020-08-18 NOTE — H&P (View-Only) (Signed)
  Primary Care Physician: Swayne, David, MD Primary Cardiologist: Dr Jordan Primary Electrophysiologist: Dr Klein Referring Physician: [facility]   Kenyata Higganum is a 80 y.o. male with a history of paroxysmal atrial fibrillation, CAD, BPH, HLD, and chronic hypotension who presents for follow up in the [facility]. He is on Xarelto for a CHADS2VASC score of 3. Patient seen by Dr Jordan 06/20/19 with symptoms of exertional neck and shoulder pain similar to his previous anginal symptoms. LHC 06/25/19 showed nonobstructive disease with patent stents. His amiodarone was discontinued 2/2 side effects with autonomic dysfunction. He was in his usual health until 08/15/20 when he started having symptoms of fatigue and intermittent elevated heart rates. He is back in afib today. There were no specific triggers that he could identify.   Today, he denies symptoms of chest pain, shortness of breath, orthopnea, PND, lower extremity edema, syncope, snoring, daytime somnolence, bleeding, or neurologic sequela. The patient is tolerating medications without difficulties and is otherwise without complaint today.    Atrial Fibrillation Risk Factors:  he does not have symptoms or diagnosis of sleep apnea. he does not have a history of alcohol use. The patient does not have a history of early familial atrial fibrillation or other arrhythmias.  he has a BMI of Body mass index is 21.21 kg/m.. Filed Weights   08/18/20 1138  Weight: 70.9 kg    Family History  Problem Relation Age of Onset  . Breast cancer Mother   . Colon cancer Mother   . CAD Father   . Heart attack Paternal Grandmother   . Colon cancer Brother   . Esophageal cancer Brother   . Liver cancer Brother   . Cancer Brother   . Leukemia Paternal Uncle   . Bone cancer Paternal Grandfather   . Pancreatic cancer Neg Hx   . Prostate cancer Neg Hx   . Rectal cancer Neg Hx   . Stomach cancer Neg Hx      Atrial  Fibrillation Management history:  Previous antiarrhythmic drugs: amiodarone  Previous cardioversions: 2017, 10/15/18 Previous ablations: none CHADS2VASC score: 3 Anticoagulation history: Xarelto    Past Medical History:  Diagnosis Date  . Allergy   . Aortic atherosclerosis ([facility])   . Atrial fibrillation (H[facility])   . BPH (benign prostatic hyperplasia)   . CAD (coronary artery disease)   . Cancer (HCC)    basal cell ca - face, nose and right leg  . Clotting disorder (HCC)    a fib hx - Xarelto  . Coronary artery disease    Promus drug-eluting stent to a 99% mid LAD, 40-50% narrowing in the distal LAD with mild to moderate disease in the circ and RCA b. 5/6 PCI/DESx1 to mLcx  . Dyslipidemia   . ED (erectile dysfunction)   . Hearing loss   . Hyperplastic colon polyp   . Hypoglycemia   . Orthostatic hypotension   . REM sleep behavior disorder    not tested   Past Surgical History:  Procedure Laterality Date  . CARDIOVERSION N/A 04/23/2016   Procedure: CARDIOVERSION;  Surgeon: Mark C Skains, MD;  Location: [facility] ENDOSCOPY;  Service: Cardiovascular;  Laterality: N/A;  . COLONOSCOPY    . CORONARY ANGIOPLASTY WITH STENT PLACEMENT    . CORONARY STENT INTERVENTION N/A 11/15/2018   Procedure: CORONARY STENT INTERVENTION;  Surgeon: McAlhany, Christopher D, MD;  Location: [facility] INVASIVE CV LAB;  Service: Cardiovascular;  Laterality: N/A;  . LEFT HEART CATH AND CORONARY ANGIOGRAPHY   N/A 11/15/2018   Procedure: LEFT HEART CATH AND CORONARY ANGIOGRAPHY;  Surgeon: McAlhany, Christopher D, MD;  Location: [facility] INVASIVE CV LAB;  Service: Cardiovascular;  Laterality: N/A;  . LEFT HEART CATH AND CORONARY ANGIOGRAPHY N/A 06/25/2019   Procedure: LEFT HEART CATH AND CORONARY ANGIOGRAPHY;  Surgeon: Jordan, Peter M, MD;  Location: [facility] INVASIVE CV LAB;  Service: Cardiovascular;  Laterality: N/A;  . POLYPECTOMY    . TEE WITHOUT CARDIOVERSION N/A 04/23/2016   Procedure: TRANSESOPHAGEAL ECHOCARDIOGRAM (TEE);  Surgeon: Mark  C Skains, MD;  Location: [facility] ENDOSCOPY;  Service: Cardiovascular;  Laterality: N/A;  . UPPER GASTROINTESTINAL ENDOSCOPY      Current Outpatient Medications  Medication Sig Dispense Refill  . acetaminophen (TYLENOL) 500 MG tablet Take 500 mg by mouth every 6 (six) hours as needed for headache (pain).    . atorvastatin (LIPITOR) 20 MG tablet TAKE 1 TABLET BY MOUTH EVERY DAY 90 tablet 3  . Cholecalciferol (VITAMIN D-3) 1000 UNITS CAPS Take 1,000 Units by mouth daily.    . famotidine (PEPCID) 20 MG tablet Take 1 tablet (20 mg total) by mouth 2 (two) times daily as needed for heartburn or indigestion.    . fluticasone (FLONASE) 50 MCG/ACT nasal spray Place 1 spray into both nostrils daily as needed (seasonal allergies).     . loratadine (CLARITIN) 10 MG tablet Take 10 mg by mouth daily.    . Melatonin 3 MG TABS Take 6 mg by mouth every evening.    . midodrine (PROAMATINE) 5 MG tablet Take 5 mg by mouth as needed.    . POLYETHYLENE GLYCOL 3350 PO Take 1 g by mouth as needed for mild constipation.    . Rivaroxaban (XARELTO) 15 MG TABS tablet Take 1 tablet (15 mg total) by mouth daily with supper. 90 tablet 1  . Wheat Dextrin (BENEFIBER PO) Take 30 mLs by mouth daily.     Current Facility-Administered Medications  Medication Dose Route Frequency Provider Last Rate Last Admin  . sodium chloride flush (NS) 0.9 % injection 3 mL  3 mL Intravenous Q12H Jordan, Peter M, MD        Allergies  Allergen Reactions  . Penicillins Rash    Did it involve swelling of the face/tongue/throat, SOB, or low BP? No Did it involve sudden or severe rash/hives, skin peeling, or any reaction on the inside of your mouth or nose? No Did you need to seek medical attention at a hospital or doctor's office? No When did it last happen?50+ years  If all above answers are "NO", may proceed with cephalosporin use.     Social History   Socioeconomic History  . Marital status: Married    Spouse name: Jo Ellen  .  Number of children: Not on file  . Years of education: Not on file  . Highest education level: Professional school degree (e.g., MD, DDS, DVM, JD)  Occupational History    Comment: retired  CE  Tobacco Use  . Smoking status: Never Smoker  . Smokeless tobacco: Never Used  Vaping Use  . Vaping Use: Never used  Substance and Sexual Activity  . Alcohol use: Yes    Alcohol/week: 4.0 standard drinks    Types: 4 Glasses of wine per week  . Drug use: No  . Sexual activity: Not Currently  Other Topics Concern  . Not on file  Social History Narrative   Lives with wife   Almost no caffeine   Social Determinants of Health   Financial Resource Strain:   Not on file  Food Insecurity: Not on file  Transportation Needs: Not on file  Physical Activity: Not on file  Stress: Not on file  Social Connections: Not on file  Intimate Partner Violence: Not on file     ROS- All systems are reviewed and negative except as per the HPI above.  Physical Exam: Vitals:   08/18/20 1138  BP: 106/68  Pulse: (!) 102  Weight: 70.9 kg  Height: 6' (1.829 m)    GEN- The patient is well appearing elderly male, alert and oriented x 3 today.   HEENT-head normocephalic, atraumatic, sclera clear, conjunctiva pink, hearing intact, trachea midline. Lungs- Clear to ausculation bilaterally, normal work of breathing Heart- irregular rate and rhythm, no murmurs, rubs or gallops  GI- soft, NT, ND, + BS Extremities- no clubbing, cyanosis, or edema MS- no significant deformity or atrophy Skin- no rash or lesion Psych- euthymic mood, full affect Neuro- strength and sensation are intact   Wt Readings from Last 3 Encounters:  08/18/20 70.9 kg  08/05/20 72.1 kg  07/02/20 72 kg    EKG today demonstrates  Afib  Vent. rate 102 BPM QRS duration 86 ms QT/QTc 328/427 ms  Echo 12/14/19 demonstrated  1. Left ventricular ejection fraction, by estimation, is 55 to 60%. The  left ventricle has normal function. The  left ventricle has no regional  wall motion abnormalities. Left ventricular diastolic parameters were  normal. The average left ventricular  global longitudinal strain is -20.5 %. The global longitudinal strain is  normal.  2. Right ventricular systolic function is normal. The right ventricular  size is normal. There is normal pulmonary artery systolic pressure. The  estimated right ventricular systolic pressure is 31.5 mmHg.  3. The mitral valve is normal in structure. Mild mitral valve  regurgitation. No evidence of mitral stenosis.  4. The aortic valve is normal in structure. Aortic valve regurgitation is  not visualized. No aortic stenosis is present.  5. The inferior vena cava is normal in size with greater than 50%  respiratory variability, suggesting right atrial pressure of 3 mmHg.   Epic records are reviewed at length today  Assessment and Plan:  1. Persistent atrial fibrillation Patient appears to be in persistent afib. We discussed therapeutic options today including DCCV, ablation, and AAD.  Given the infrequent nature of his afib, will plan for DCCV alone. Check cbc/bmet If he should have early return of afib, can consider rhythm control with ablation or dofetilide. Continue Xarelto 20 mg daily  This patients CHA2DS2-VASc Score and unadjusted Ischemic Stroke Rate (% per year) is equal to 3.2 % stroke rate/year from a score of 3  Above score calculated as 1 point each if present [CHF, HTN, DM, Vascular=MI/PAD/Aortic Plaque, Age if 65-74, or Male] Above score calculated as 2 points each if present [Age > 75, or Stroke/TIA/TE]  2. CAD S/p DES to LAD 2011 and mid cirfumflex 11/2018. Repeat LHC 06/25/19 showed nonobstructive disease and patent stents. Followed by Dr Jordan.  3. Orthostatic Hypotension On midodrine PRN, followed by Dr Klein. BP stable today.   Follow up in the AF clinic one week post DCCV and with Dr Jordan as scheduled.    Ricky Quantina Dershem  PA-C [facility] [address] [place], [place] [address] [phone] 08/18/2020 12:01 PM 

## 2020-08-18 NOTE — Telephone Encounter (Signed)
Appt made for 2/7

## 2020-08-19 ENCOUNTER — Other Ambulatory Visit (HOSPITAL_COMMUNITY)
Admission: RE | Admit: 2020-08-19 | Discharge: 2020-08-19 | Disposition: A | Discharge status: Home / Self Care | Payer: Medicare Other | Source: Ambulatory Visit | Attending: Cardiology | Admitting: Cardiology

## 2020-08-19 DIAGNOSIS — Z01812 Encounter for preprocedural laboratory examination: Secondary | ICD-10-CM | POA: Insufficient documentation

## 2020-08-19 DIAGNOSIS — Z20822 Contact with and (suspected) exposure to covid-19: Secondary | ICD-10-CM | POA: Insufficient documentation

## 2020-08-19 LAB — SARS CORONAVIRUS 2 (TAT 6-24 HRS): SARS Coronavirus 2: NEGATIVE

## 2020-08-20 ENCOUNTER — Other Ambulatory Visit: Payer: Medicare Other

## 2020-08-21 ENCOUNTER — Other Ambulatory Visit: Payer: Self-pay

## 2020-08-21 ENCOUNTER — Ambulatory Visit (HOSPITAL_COMMUNITY): Payer: Medicare Other | Admitting: Anesthesiology

## 2020-08-21 ENCOUNTER — Encounter (HOSPITAL_COMMUNITY): Payer: Self-pay | Admitting: Cardiology

## 2020-08-21 ENCOUNTER — Ambulatory Visit (HOSPITAL_COMMUNITY)
Admission: RE | Admit: 2020-08-21 | Discharge: 2020-08-21 | Disposition: A | Discharge status: Home / Self Care | Payer: Medicare Other | Attending: Cardiology | Admitting: Cardiology

## 2020-08-21 ENCOUNTER — Encounter (HOSPITAL_COMMUNITY)
Admission: RE | Disposition: A | Discharge status: Home / Self Care | Payer: Self-pay | Source: Home / Self Care | Attending: Cardiology

## 2020-08-21 DIAGNOSIS — Z880 Allergy status to penicillin: Secondary | ICD-10-CM | POA: Diagnosis not present

## 2020-08-21 DIAGNOSIS — I2510 Atherosclerotic heart disease of native coronary artery without angina pectoris: Secondary | ICD-10-CM | POA: Diagnosis not present

## 2020-08-21 DIAGNOSIS — Z955 Presence of coronary angioplasty implant and graft: Secondary | ICD-10-CM | POA: Diagnosis not present

## 2020-08-21 DIAGNOSIS — I4819 Other persistent atrial fibrillation: Secondary | ICD-10-CM | POA: Insufficient documentation

## 2020-08-21 DIAGNOSIS — Z7901 Long term (current) use of anticoagulants: Secondary | ICD-10-CM | POA: Insufficient documentation

## 2020-08-21 DIAGNOSIS — I951 Orthostatic hypotension: Secondary | ICD-10-CM | POA: Diagnosis not present

## 2020-08-21 DIAGNOSIS — E785 Hyperlipidemia, unspecified: Secondary | ICD-10-CM | POA: Diagnosis not present

## 2020-08-21 DIAGNOSIS — Z79899 Other long term (current) drug therapy: Secondary | ICD-10-CM | POA: Diagnosis not present

## 2020-08-21 DIAGNOSIS — I4892 Unspecified atrial flutter: Secondary | ICD-10-CM | POA: Insufficient documentation

## 2020-08-21 DIAGNOSIS — I25110 Atherosclerotic heart disease of native coronary artery with unstable angina pectoris: Secondary | ICD-10-CM | POA: Diagnosis not present

## 2020-08-21 HISTORY — PX: CARDIOVERSION: SHX1299

## 2020-08-21 SURGERY — CARDIOVERSION
Anesthesia: General

## 2020-08-21 MED ORDER — LIDOCAINE HCL (CARDIAC) PF 100 MG/5ML IV SOSY
PREFILLED_SYRINGE | INTRAVENOUS | Status: DC | PRN
  Administered 2020-08-21: 20 mg via INTRATRACHEAL

## 2020-08-21 MED ORDER — SODIUM CHLORIDE 0.9 % IV SOLN: INTRAVENOUS | Status: DC | PRN

## 2020-08-21 MED ORDER — PROPOFOL 10 MG/ML IV BOLUS
INTRAVENOUS | Status: DC | PRN
  Administered 2020-08-21: 60 mg via INTRAVENOUS
  Administered 2020-08-21: 40 mg via INTRAVENOUS

## 2020-08-21 NOTE — CV Procedure (Signed)
   Cardioversion Note  Jerry Barr [id number] 1940/07/31  Procedure: DC Cardioversion Indications: atrial flutter  Procedure Details Consent: Obtained Time Out: Verified patient identification, verified procedure, site/side was marked, verified correct patient position, special equipment/implants available, Radiology Safety Procedures followed,  medications/allergies/relevent history reviewed, required imaging and test results available.  Performed  The patient has been on adequate anticoagulation.  The patient received IV 100 mg of Propofol and 20 mg IV Lidocaine  for sedation.  Synchronous cardioversion was performed at 120 joules.  The cardioversion was successful.   Complications: No apparent complications Patient did tolerate procedure well.  Jerry Dawley, MD, [facility] 08/21/2020, 11:23 AM

## 2020-08-21 NOTE — Anesthesia Preprocedure Evaluation (Addendum)
Anesthesia Evaluation  Patient identified by MRN, date of birth, ID band Patient awake    Reviewed: Allergy & Precautions, NPO status , Patient's Chart, lab work & pertinent test results  History of Anesthesia Complications Negative for: history of anesthetic complications  Airway Mallampati: I  TM Distance: >3 FB Neck ROM: Full    Dental  (+) Caps, Dental Advisory Given   Pulmonary neg pulmonary ROS,  08/19/2020 SARS coronavirus NEG   breath sounds clear to auscultation       Cardiovascular (-) angina+ CAD, + Cardiac Stents and + Peripheral Vascular Disease  + dysrhythmias Atrial Fibrillation  Rhythm:Irregular Rate:Normal  12/2019 ECHO: EF 55-60%. LV normal function, no regional wall motion abnormalities, mild MR   Neuro/Psych negative neurological ROS     GI/Hepatic Neg liver ROS, GERD  Medicated and Controlled,  Endo/Other  negative endocrine ROS  Renal/GU negative Renal ROS     Musculoskeletal   Abdominal   Peds  Hematology xarelto   Anesthesia Other Findings   Reproductive/Obstetrics                            Anesthesia Physical Anesthesia Plan  ASA: III  Anesthesia Plan: General   Post-op Pain Management:    Induction: Intravenous  PONV Risk Score and Plan: 2 and Treatment may vary due to age or medical condition  Airway Management Planned: Mask and Natural Airway  Additional Equipment: None  Intra-op Plan:   Post-operative Plan:   Informed Consent: I have reviewed the patients History and Physical, chart, labs and discussed the procedure including the risks, benefits and alternatives for the proposed anesthesia with the patient or authorized representative who has indicated his/her understanding and acceptance.     Dental advisory given  Plan Discussed with: CRNA and Surgeon  Anesthesia Plan Comments:        Anesthesia Quick Evaluation

## 2020-08-21 NOTE — Anesthesia Postprocedure Evaluation (Signed)
Anesthesia Post Note  Patient: Jerry Barr  Procedure(s) Performed: CARDIOVERSION (N/A )     Patient location during evaluation: Endoscopy Anesthesia Type: General Level of consciousness: awake and alert, patient cooperative and oriented Pain management: pain level controlled Vital Signs Assessment: post-procedure vital signs reviewed and stable Respiratory status: spontaneous breathing, nonlabored ventilation and respiratory function stable Cardiovascular status: blood pressure returned to baseline and stable Postop Assessment: no apparent nausea or vomiting and able to ambulate Anesthetic complications: no   No complications documented.  Last Vitals:  Vitals:   08/21/20 1041 08/21/20 1124  BP: (!) 136/91 123/75  Pulse: 86 73  Resp: 16 20  Temp: 36.6 C 36.6 C  SpO2: 100% 100%    Last Pain:  Vitals:   08/21/20 1124  TempSrc: Oral  PainSc: 0-No pain                 Deronte Solis,E. Lanessa Shill

## 2020-08-21 NOTE — Interval H&P Note (Signed)
History and Physical Interval Note:  08/21/2020 10:45 AM  Jerry Barr  has presented today for surgery, with the diagnosis of a-fib.  The various methods of treatment have been discussed with the patient and family. After consideration of risks, benefits and other options for treatment, the patient has consented to  Procedure(s): CARDIOVERSION (N/A) as a surgical intervention.  The patient's history has been reviewed, patient examined, no change in status, stable for surgery.  I have reviewed the patient's chart and labs.  Questions were answered to the patient's satisfaction.     [name]

## 2020-08-21 NOTE — Transfer of Care (Signed)
Immediate Anesthesia Transfer of Care Note  Patient: Jerry Barr  Procedure(s) Performed: CARDIOVERSION (N/A )  Patient Location: Endoscopy Unit  Anesthesia Type:General  Level of Consciousness: responds to stimulation  Airway & Oxygen Therapy: Patient Spontanous Breathing and Patient connected to nasal cannula oxygen  Post-op Assessment: Report given to RN and Post -op Vital signs reviewed and stable  Post vital signs: Reviewed and stable  Last Vitals:  Vitals Value Taken Time  BP    Temp    Pulse 74 08/21/20 1121  Resp 16 08/21/20 1121  SpO2 99 % 08/21/20 1121  Vitals shown include unvalidated device data.  Last Pain:  Vitals:   08/21/20 1041  TempSrc: Temporal  PainSc: 0-No pain         Complications: No complications documented.

## 2020-08-24 ENCOUNTER — Encounter (HOSPITAL_COMMUNITY): Payer: Self-pay | Admitting: Cardiology

## 2020-08-25 ENCOUNTER — Telehealth (HOSPITAL_COMMUNITY): Payer: Self-pay | Admitting: *Deleted

## 2020-08-25 ENCOUNTER — Inpatient Hospital Stay (HOSPITAL_COMMUNITY)
Admission: EM | Admit: 2020-08-25 | Discharge: 2020-08-31 | DRG: 310 | Disposition: A | Discharge status: Home / Self Care | Payer: Medicare Other | Attending: Internal Medicine | Admitting: Internal Medicine

## 2020-08-25 ENCOUNTER — Ambulatory Visit (HOSPITAL_COMMUNITY): Payer: Medicare Other | Admitting: Physician Assistant

## 2020-08-25 DIAGNOSIS — I10 Essential (primary) hypertension: Secondary | ICD-10-CM | POA: Diagnosis present

## 2020-08-25 DIAGNOSIS — I4891 Unspecified atrial fibrillation: Secondary | ICD-10-CM

## 2020-08-25 DIAGNOSIS — R531 Weakness: Secondary | ICD-10-CM | POA: Diagnosis not present

## 2020-08-25 DIAGNOSIS — G909 Disorder of the autonomic nervous system, unspecified: Secondary | ICD-10-CM | POA: Diagnosis present

## 2020-08-25 DIAGNOSIS — Z880 Allergy status to penicillin: Secondary | ICD-10-CM

## 2020-08-25 DIAGNOSIS — I951 Orthostatic hypotension: Secondary | ICD-10-CM | POA: Diagnosis not present

## 2020-08-25 DIAGNOSIS — Z806 Family history of leukemia: Secondary | ICD-10-CM

## 2020-08-25 DIAGNOSIS — E785 Hyperlipidemia, unspecified: Secondary | ICD-10-CM | POA: Diagnosis not present

## 2020-08-25 DIAGNOSIS — R0902 Hypoxemia: Secondary | ICD-10-CM | POA: Diagnosis not present

## 2020-08-25 DIAGNOSIS — I480 Paroxysmal atrial fibrillation: Principal | ICD-10-CM | POA: Diagnosis present

## 2020-08-25 DIAGNOSIS — Z7901 Long term (current) use of anticoagulants: Secondary | ICD-10-CM

## 2020-08-25 DIAGNOSIS — Z803 Family history of malignant neoplasm of breast: Secondary | ICD-10-CM

## 2020-08-25 DIAGNOSIS — Z20822 Contact with and (suspected) exposure to covid-19: Secondary | ICD-10-CM | POA: Diagnosis not present

## 2020-08-25 DIAGNOSIS — Z79899 Other long term (current) drug therapy: Secondary | ICD-10-CM

## 2020-08-25 DIAGNOSIS — Z8249 Family history of ischemic heart disease and other diseases of the circulatory system: Secondary | ICD-10-CM

## 2020-08-25 DIAGNOSIS — I2510 Atherosclerotic heart disease of native coronary artery without angina pectoris: Secondary | ICD-10-CM

## 2020-08-25 DIAGNOSIS — Z800 Family history of malignant neoplasm of digestive organs: Secondary | ICD-10-CM

## 2020-08-25 DIAGNOSIS — Z955 Presence of coronary angioplasty implant and graft: Secondary | ICD-10-CM

## 2020-08-25 LAB — CBC WITH DIFFERENTIAL/PLATELET
Abs Immature Granulocytes: 0.02 10*3/uL (ref 0.00–0.07)
Basophils Absolute: 0 10*3/uL (ref 0.0–0.1)
Basophils Relative: 1 %
Eosinophils Absolute: 0.2 10*3/uL (ref 0.0–0.5)
Eosinophils Relative: 2 %
HCT: 49 % (ref 39.0–52.0)
Hemoglobin: 15.9 g/dL (ref 13.0–17.0)
Immature Granulocytes: 0 %
Lymphocytes Relative: 18 %
Lymphs Abs: 1.3 10*3/uL (ref 0.7–4.0)
MCH: 30.2 pg (ref 26.0–34.0)
MCHC: 32.4 g/dL (ref 30.0–36.0)
MCV: 93 fL (ref 80.0–100.0)
Monocytes Absolute: 0.7 10*3/uL (ref 0.1–1.0)
Monocytes Relative: 9 %
Neutro Abs: 5.3 10*3/uL (ref 1.7–7.7)
Neutrophils Relative %: 70 %
Platelets: 210 10*3/uL (ref 150–400)
RBC: 5.27 MIL/uL (ref 4.22–5.81)
RDW: 13.7 % (ref 11.5–15.5)
WBC: 7.6 10*3/uL (ref 4.0–10.5)
nRBC: 0 % (ref 0.0–0.2)

## 2020-08-25 LAB — URINALYSIS, ROUTINE W REFLEX MICROSCOPIC
Bilirubin Urine: NEGATIVE
Glucose, UA: NEGATIVE mg/dL
Hgb urine dipstick: NEGATIVE
Ketones, ur: NEGATIVE mg/dL
Leukocytes,Ua: NEGATIVE
Nitrite: NEGATIVE
Protein, ur: NEGATIVE mg/dL
Specific Gravity, Urine: 1.012 (ref 1.005–1.030)
pH: 7 (ref 5.0–8.0)

## 2020-08-25 LAB — COMPREHENSIVE METABOLIC PANEL
ALT: 14 U/L (ref 0–44)
AST: 17 U/L (ref 15–41)
Albumin: 3.6 g/dL (ref 3.5–5.0)
Alkaline Phosphatase: 57 U/L (ref 38–126)
Anion gap: 8 (ref 5–15)
BUN: 20 mg/dL (ref 8–23)
CO2: 26 mmol/L (ref 22–32)
Calcium: 8.9 mg/dL (ref 8.9–10.3)
Chloride: 107 mmol/L (ref 98–111)
Creatinine, Ser: 0.93 mg/dL (ref 0.61–1.24)
GFR, Estimated: >60 mL/min (ref >60)
Glucose, Bld: 88 mg/dL (ref 70–99)
Potassium: 4.2 mmol/L (ref 3.5–5.1)
Sodium: 141 mmol/L (ref 135–145)
Total Bilirubin: 0.8 mg/dL (ref 0.3–1.2)
Total Protein: 6.6 g/dL (ref 6.5–8.1)

## 2020-08-25 LAB — MAGNESIUM: Magnesium: 2.4 mg/dL (ref 1.7–2.4)

## 2020-08-25 LAB — SARS CORONAVIRUS 2 (TAT 6-24 HRS): SARS Coronavirus 2: NEGATIVE

## 2020-08-25 LAB — TSH: TSH: 2.124 u[IU]/mL (ref 0.350–4.500)

## 2020-08-25 MED ORDER — ATORVASTATIN CALCIUM 10 MG PO TABS
20.0000 mg | ORAL_TABLET | Freq: Every day | ORAL | Status: DC
  Administered 2020-08-26 – 2020-08-31 (×6): 20 mg via ORAL
  Filled 2020-08-25 (×7): qty 2

## 2020-08-25 MED ORDER — LORATADINE 10 MG PO TABS: 10.0000 mg | ORAL_TABLET | Freq: Every day | ORAL | Status: DC

## 2020-08-25 MED ORDER — ACETAMINOPHEN 325 MG PO TABS
650.0000 mg | ORAL_TABLET | ORAL | Status: DC | PRN
  Administered 2020-08-26 – 2020-08-30 (×5): 650 mg via ORAL
  Filled 2020-08-25 (×5): qty 2

## 2020-08-25 MED ORDER — ONDANSETRON HCL 4 MG/2ML IJ SOLN: 4.0000 mg | Freq: Four times a day (QID) | INTRAMUSCULAR | Status: DC | PRN

## 2020-08-25 MED ORDER — RIVAROXABAN 20 MG PO TABS: 20.0000 mg | ORAL_TABLET | Freq: Every day | ORAL | Status: DC

## 2020-08-25 MED ORDER — MIDODRINE HCL 5 MG PO TABS
5.0000 mg | ORAL_TABLET | Freq: Every day | ORAL | Status: DC
  Administered 2020-08-26 – 2020-08-31 (×6): 5 mg via ORAL
  Filled 2020-08-25 (×7): qty 1

## 2020-08-25 MED ORDER — NITROGLYCERIN 0.4 MG SL SUBL: 0.4000 mg | SUBLINGUAL_TABLET | SUBLINGUAL | Status: DC | PRN

## 2020-08-25 MED ORDER — FAMOTIDINE 20 MG PO TABS: 20.0000 mg | ORAL_TABLET | Freq: Two times a day (BID) | ORAL | Status: DC | PRN

## 2020-08-25 MED ORDER — LORATADINE 10 MG PO TABS
10.0000 mg | ORAL_TABLET | Freq: Every day | ORAL | Status: DC
  Administered 2020-08-26 – 2020-08-31 (×6): 10 mg via ORAL
  Filled 2020-08-25 (×6): qty 1

## 2020-08-25 MED ORDER — POLYETHYLENE GLYCOL 3350 17 G PO PACK: 17.0000 g | PACK | Freq: Every day | ORAL | Status: DC | PRN

## 2020-08-25 MED ORDER — VITAMIN D 25 MCG (1000 UNIT) PO TABS
1000.0000 [IU] | ORAL_TABLET | Freq: Every day | ORAL | Status: DC
  Administered 2020-08-26 – 2020-08-31 (×6): 1000 [IU] via ORAL
  Filled 2020-08-25 (×6): qty 1

## 2020-08-25 MED ORDER — VITAMIN D-3 25 MCG (1000 UT) PO CAPS: 1000.0000 [IU] | ORAL_CAPSULE | Freq: Every day | ORAL | Status: DC

## 2020-08-25 MED ORDER — MELATONIN 3 MG PO TABS
6.0000 mg | ORAL_TABLET | Freq: Every evening | ORAL | Status: DC
  Administered 2020-08-25 – 2020-08-30 (×6): 6 mg via ORAL
  Filled 2020-08-25 (×7): qty 2

## 2020-08-25 NOTE — ED Triage Notes (Signed)
Pt arrives from home via EMS. Pt complains of weakness today, recently discharged from hospital for a-fib. Cardiologist recently took pt off some medications and pt has been not been feeling well. Pt's cardiologist told pt to come to ED. Pt a/o, BGL=83. 18g IV place left hand. Pt received approx 200 ml NS and reports that he feels better. EMS reports pt's weakness worsens with exertion.

## 2020-08-25 NOTE — Telephone Encounter (Signed)
845AM - Patient called to AF this morning stating he feels like he returned to Afib yesterday morning. He feels very weak BP 90/50 HR 65 this AM. He states he feels poorly when in AF.  Moved appt up to this afternoon for further assessment.   915AM - patient calls back to Af clinic stating he is feeling worse - more weak, shaky all over, does not feel like he can get out of bed  and feels like his words are slurred (patient was completely alert oriented and normal sounding without slurring on the phone call).  Instructed patient if he is feeling as though he cannot get out of bed he should call EMS to transport to ER for further evaluation. Pt verbalized agreement.

## 2020-08-25 NOTE — ED Notes (Signed)
Pt given a [place] sandwich bag and water.

## 2020-08-25 NOTE — ED Notes (Signed)
Report given to [employer] from [employer]

## 2020-08-25 NOTE — ED Notes (Signed)
Got patient into a gown on the monitor did ekg shown to Dr Eulis Foster patient is resting with call bell in reach GOT PATIENT A WARM BLANKET

## 2020-08-25 NOTE — ED Notes (Signed)
Dinner Tray Ordered @ [phone].

## 2020-08-25 NOTE — ED Provider Notes (Signed)
[facility] EMERGENCY DEPARTMENT Provider Note   CSN: [id number] Arrival date & time: 08/25/20  1006     History Chief Complaint  Patient presents with  . Weakness    Jerry Barr is a 80 y.o. male.  HPI Presents for evaluation of sensation of irregular heartbeat, and concern for atrial fibrillation.  He is also very weak and unable to to anything that requires standing or moving around.  He has severe orthostatic hypotension and is currently taking midodrine, and drinking salt water to improve that.  He had cardioversion done several days ago, after recurrent atrial fibrillation.  He is anticoagulated.  He denies chest pain, shortness of breath, current bowel or urine problems.  He has been eating well.  He contacted the atrial fibrillation clinic who advised that he come to the ED for evaluation.  There are no other known modifying factors.    Past Medical History:  Diagnosis Date  . Allergy   . Aortic atherosclerosis ([place])   . Atrial fibrillation ([place])   . BPH (benign prostatic hyperplasia)   . CAD (coronary artery disease)   . Cancer (HCC)    basal cell ca - face, nose and right leg  . Clotting disorder (HCC)    a fib hx - Xarelto  . Coronary artery disease    Promus drug-eluting stent to a 99% mid LAD, 40-50% narrowing in the distal LAD with mild to moderate disease in the circ and RCA b. 5/6 PCI/DESx1 to mLcx  . Dyslipidemia   . ED (erectile dysfunction)   . Hearing loss   . Hyperplastic colon polyp   . Hypoglycemia   . Orthostatic hypotension   . REM sleep behavior disorder    not tested    Patient Active Problem List   Diagnosis Date Noted  . Persistent atrial fibrillation ([place]) 08/18/2020  . Secondary hypercoagulable state ([place]) 05/16/2019  . Unstable angina ([place])   . Orthostatic hypotension 06/13/2017  . Paroxysmal atrial fibrillation ([place]) 04/20/2016  . Palpitations 04/20/2016  . Opacity of lung on imaging study 04/20/2016  . Atrial  fibrillation with RVR ([place]) 04/20/2016  . Angina pectoris ([place])   . Dyslipidemia   . CAD (coronary artery disease), native coronary artery 10/29/2014    Past Surgical History:  Procedure Laterality Date  . CARDIOVERSION N/A 04/23/2016   Procedure: CARDIOVERSION;  Surgeon: [name], MD;  Location: [facility];  Service: Cardiovascular;  Laterality: N/A;  . CARDIOVERSION N/A 08/21/2020   Procedure: CARDIOVERSION;  Surgeon: Dorothy Spark, MD;  Location: [facility] ENDOSCOPY;  Service: Cardiovascular;  Laterality: N/A;  . COLONOSCOPY    . CORONARY ANGIOPLASTY WITH STENT PLACEMENT    . CORONARY STENT INTERVENTION N/A 11/15/2018   Procedure: CORONARY STENT INTERVENTION;  Surgeon: Burnell Blanks, MD;  Location: [facility] CV LAB;  Service: Cardiovascular;  Laterality: N/A;  . LEFT HEART CATH AND CORONARY ANGIOGRAPHY N/A 11/15/2018   Procedure: LEFT HEART CATH AND CORONARY ANGIOGRAPHY;  Surgeon: Burnell Blanks, MD;  Location: [facility] CV LAB;  Service: Cardiovascular;  Laterality: N/A;  . LEFT HEART CATH AND CORONARY ANGIOGRAPHY N/A 06/25/2019   Procedure: LEFT HEART CATH AND CORONARY ANGIOGRAPHY;  Surgeon: Martinique, Peter M, MD;  Location: [facility] CV LAB;  Service: Cardiovascular;  Laterality: N/A;  . POLYPECTOMY    . TEE WITHOUT CARDIOVERSION N/A 04/23/2016   Procedure: TRANSESOPHAGEAL ECHOCARDIOGRAM (TEE);  Surgeon: [name], MD;  Location: [facility];  Service: Cardiovascular;  Laterality: N/A;  .  UPPER GASTROINTESTINAL ENDOSCOPY         Family History  Problem Relation Age of Onset  . Breast cancer Mother   . Colon cancer Mother   . CAD Father   . Heart attack Paternal Grandmother   . Colon cancer Brother   . Esophageal cancer Brother   . Liver cancer Brother   . Cancer Brother   . Leukemia Paternal Uncle   . Bone cancer Paternal Grandfather   . Pancreatic cancer Neg Hx   . Prostate cancer Neg Hx   . Rectal cancer Neg Hx   . Stomach cancer Neg Hx      Social History   Tobacco Use  . Smoking status: Never Smoker  . Smokeless tobacco: Never Used  Vaping Use  . Vaping Use: Never used  Substance Use Topics  . Alcohol use: Yes    Alcohol/week: 4.0 standard drinks    Types: 4 Glasses of wine per week  . Drug use: No    Home Medications Prior to Admission medications   Medication Sig Start Date End Date Taking? Authorizing Provider  acetaminophen (TYLENOL) 500 MG tablet Take 500 mg by mouth every 6 (six) hours as needed for headache (pain).    [provider]  atorvastatin (LIPITOR) 20 MG tablet TAKE 1 TABLET BY MOUTH EVERY DAY Patient taking differently: Take 20 mg by mouth daily. 07/23/20   Lendon Colonel, NP  Cholecalciferol (VITAMIN D-3) 1000 UNITS CAPS Take 1,000 Units by mouth daily.    [provider]  famotidine (PEPCID) 20 MG tablet Take 1 tablet (20 mg total) by mouth 2 (two) times daily as needed for heartburn or indigestion. 06/19/19   Martinique, Peter M, MD  fluticasone (FLONASE) 50 MCG/ACT nasal spray Place 1 spray into both nostrils daily as needed (seasonal allergies).     [provider]  loratadine (CLARITIN) 10 MG tablet Take 10 mg by mouth daily.    [provider]  Melatonin 3 MG TABS Take 6 mg by mouth every evening.    [provider]  midodrine (PROAMATINE) 5 MG tablet Take 5 mg by mouth daily as needed (low blood pressure). 08/05/20   [provider]  polyethylene glycol (MIRALAX / GLYCOLAX) 17 g packet Take 17 g by mouth daily as needed for mild constipation.    [provider]  Rivaroxaban (XARELTO) 15 MG TABS tablet Take 1 tablet (15 mg total) by mouth daily with supper. 05/23/20   Martinique, Peter M, MD  Wheat Dextrin (BENEFIBER PO) Take 30 mLs by mouth daily.    [provider]    Allergies    Penicillins  Review of Systems   Review of Systems  All other systems reviewed and are negative.   Physical Exam Updated Vital Signs BP  (!) 144/101   Pulse 66   Temp 97.7 F (36.5 C) (Oral)   Resp 11   SpO2 100%   Physical Exam Vitals and nursing note reviewed.  Constitutional:      General: He is not in acute distress.    Appearance: Normal appearance. He is well-developed and well-nourished. He is not ill-appearing, toxic-appearing or diaphoretic.  HENT:     Head: Normocephalic and atraumatic.     Right Ear: External ear normal.     Left Ear: External ear normal.  Eyes:     Extraocular Movements: EOM normal.     Conjunctiva/sclera: Conjunctivae normal.     Pupils: Pupils are equal, round, and reactive to  light.  Neck:     Trachea: Phonation normal.  Cardiovascular:     Rate and Rhythm: Normal rate. Rhythm irregular.     Heart sounds: Normal heart sounds.  Pulmonary:     Effort: Pulmonary effort is normal.     Breath sounds: Normal breath sounds.  Chest:     Chest wall: No bony tenderness.  Abdominal:     General: There is no distension.     Palpations: Abdomen is soft.     Tenderness: There is no abdominal tenderness.  Musculoskeletal:        General: Normal range of motion.     Cervical back: Normal range of motion and neck supple.     Right lower leg: No edema.     Left lower leg: No edema.  Skin:    General: Skin is warm, dry and intact.  Neurological:     Mental Status: He is alert and oriented to person, place, and time.     Cranial Nerves: No cranial nerve deficit.     Sensory: No sensory deficit.     Motor: No abnormal muscle tone.     Coordination: Coordination normal.  Psychiatric:        Mood and Affect: Mood and affect and mood normal.        Behavior: Behavior normal.        Thought Content: Thought content normal.        Judgment: Judgment normal.     ED Results / Procedures / Treatments   Labs (all labs ordered are listed, but only abnormal results are displayed) Labs Reviewed  SARS CORONAVIRUS 2 (TAT 6-24 HRS)  COMPREHENSIVE METABOLIC PANEL  CBC WITH DIFFERENTIAL/PLATELET   URINALYSIS, ROUTINE W REFLEX MICROSCOPIC    EKG EKG Interpretation  Date/Time:  Monday August 25 2020 10:17:42 EST Ventricular Rate:  77 PR Interval:    QRS Duration: 95 QT Interval:  392 QTC Calculation: 444 R Axis:   70 Text Interpretation: Atrial fibrillation Ventricular premature complex Probable left ventricular hypertrophy Since last tracing now in Atrial fibrillation and pvc new Otherwise no significant change Confirmed by Daleen Bo [phone]) on 08/25/2020 10:29:40 AM   Radiology No results found.  Procedures Procedures   Medications Ordered in ED Medications - No data to display  ED Course  I have reviewed the triage vital signs and the nursing notes.  Pertinent labs & imaging results that were available during my care of the patient were reviewed by me and considered in my medical decision making (see chart for details).  Clinical Course as of 08/25/20 1624  Mon Aug 25, 2020  1304 DispositionDiscussed with Dr. Debara Pickett, cardiology, he will see the patient in the ED for possible admission. [EW]    Clinical Course User Index [EW] Daleen Bo, MD   MDM Rules/Calculators/A&P                           Patient Vitals for the past 24 hrs:  BP Temp Temp src Pulse Resp SpO2  08/25/20 1500 (!) 144/101 -- -- 66 11 100 %  08/25/20 1430 (!) 123/94 -- -- 65 18 100 %  08/25/20 1400 140/87 -- -- 70 19 100 %  08/25/20 1330 (!) 146/98 -- -- 72 10 100 %  08/25/20 1300 132/89 -- -- 73 (!) 21 99 %  08/25/20 1230 136/88 -- -- 67 (!) 0 100 %  08/25/20 1100 (!) 135/95 -- -- 74 17 100 %  08/25/20 1018 (!) 155/103 97.7 F (36.5 C) Oral 70 20 100 %    4:20 PM Reevaluation with update and discussion. After initial assessment and treatment, an updated evaluation reveals he remains stable, but has noticed heart rate going to 145 when he stands up.  Awaiting cardiology consultation.  Patient is hungry.  I will allow him to eat.  I do not anticipate that he will require urgent  cardioversion. Daleen Bo   Medical Decision Making:  This patient is presenting for evaluation of irregular heartbeat, which does require a range of treatment options, and is a complaint that involves a high risk of morbidity and mortality. The differential diagnoses include recurrent atrial fibrillation, cardiac arrhythmia, acute illness, metabolic disorder. I decided to review old records, and in summary elderly male with orthostatic hypotension and paroxysmal atrial fibrillation.  He had a recent successful cardioversion.  I did not require additional historical information from anyone.  Clinical Laboratory Tests Ordered, included CBC, Metabolic panel and Urinalysis. Review indicates normal findings.   Cardiac Monitor Tracing which shows atrial fibrillation    Critical Interventions-clinical evaluation, laboratory testing, cardiac monitor, EKG, reassessment  After These Interventions, the Patient was reevaluated and was found with recurrent atrial fibrillation, symptomatic.  Cardiology consult required in the emergency department.  Patient with significant orthostatic tachycardia, and discomfort preventing him from performing usual activities.  Patient with very transient, 3-day, improvement following recent cardioversion.  He is stable at rest.  He does not require urgent cardioversion.  Cardiology consultation pending at the time of completion of this record.  CRITICAL CARE-no Performed by: Daleen Bo  Nursing Notes Reviewed/ Care Coordinated Applicable Imaging Reviewed Interpretation of Laboratory Data incorporated into ED treatment   Plan-disposition per cardiology recommendations.    Final Clinical Impression(s) / ED Diagnoses Final diagnoses:  Paroxysmal atrial fibrillation [facility])    Rx / DC Orders ED Discharge Orders    None       Daleen Bo, MD 08/25/20 1625

## 2020-08-25 NOTE — H&P (Addendum)
Cardiology Consultation:   Patient ID: Jerry Barr MRN: [id number]; DOB: [birth year]  Admit date: 08/25/2020 Date of Consult: 08/25/2020  PCP:  Antony Contras, MD   [place]  Cardiologist:  Peter Martinique, MD Electrophysiologist:  Virl Axe, MD   Patient Profile:   Jerry Barr is a 80 y.o. male with a history of CAD with prior stenting to LAD and LCX, atrial fibrillation on Xarelto s/p recent DCCV on 08/21/2020, orthostatic hypotension on Midodrine and concern for autonomic dysfunction, dysplipidemia, who is being seen today for the evaluation of atrial fibrillation at the request of Dr. Eulis Foster.  History of Present Illness:   Mr. Alms is a 80 year old male with the above history who is followed by Dr. Martinique and Dr. Caryl Comes. He has a history of CAD with prior stenting to LAD and LCX. Most recent cath in 06/2019 showed patent stents with non-obstructive CAD. Continued medical therapy was recommended. Most recent Echo from 12/2019 showed LVEF of 55-60% with normal wall motion. Cardiac Monitor around this time showed atrial fibrillation less than 1% of the time with rare PVC and PACs. Low dose beta-blocker was but patient did not tolerate this well so it was stopped. He was previously on Amiodarone but this was discontinued due to side effects with autonomic dysfunction. He was recently seen by Malka So, PA-C in the [facility], at which time he reported recurrent fatigue and intermittent elevated heart rates. He was noted to be back in atrial fibrillation at that visit. He was set up for outpatient DCCV and underwent this on 08/21/2020. Cardioversion was successful and he was doing well until yesterday morning when he felt like he went back into atrial fibrillation.   He states on Saturday he went for a 45 minute walk and did great without any issues. However, yesterday morning he woke up and did usual morning routine of drinking salt water, taking  Midodrine, and putting on abdominal binder and compression stockings. Despite this, he felt like he could barely stand up for 30 seconds because he felt so weak and felt like he was going to fall. Denied clear lightheadedness, dizziness, or near syncope. He denies any falls or syncope. He reports palpitations and feeling like it heart was racing. Symptoms persisted today. BP was 75/45 sitting down this morning. He reported neck/should pain when standing which is the normal symptoms he has with his orthostatic hypotension. His BP was so low this morning he took 2 doses of Midodrine. He denies any chest pain, shortness of breath, orthopnea, PND, or edema. He has not missed any doses of his Xarelto. No abnormal bleeding in urine or stools. He reports some abdominal soreness a few weeks ago but none today. No nausea or vomiting. No recent fevers or illnesses.  He was scheduled to follow-up in the [facility] today but his symptoms were so severe that he did not feel like he could wait. While on the phone with the Atrial Fibrillation clinic, he mentioned concern about his speech being slurred but RN who was familiar with patient reports that his speech sounded normal. When I ask him about this, he states he thinks it was just because his BP was so low. Denies any other stroke like symptoms including facial droop, vision changes, one sided weakness/numbness.  In the ED, patient hypertensive but vitals stable. EKG showed rate controlled atrial fibrillation with PVC and no acute ST/T changes. WBC 7.6, Hgb 15.9, Plts 210. Na 141, K 4.2,  Glucose 88, BUN 20, Cr 0.93. LFTs normal. Heart rates well controlled at rate but spikes to the 140's to 160's with minimal movement in the bed. Therefore, Cardiology consulted for further evaluation.  At the time of this evaluation, patient resting comfortably in bed in no acute distress. Rates shoot up to the 140's to 160's with minimal movement. However, he denise any  symptoms other than palpitations as long as he is sitting down. He states the problem is when he stands up.  Past Medical History:  Diagnosis Date  . Allergy   . Aortic atherosclerosis ([place])   . Atrial fibrillation ([place])   . BPH (benign prostatic hyperplasia)   . CAD (coronary artery disease)   . Cancer (HCC)    basal cell ca - face, nose and right leg  . Clotting disorder (HCC)    a fib hx - Xarelto  . Coronary artery disease    Promus drug-eluting stent to a 99% mid LAD, 40-50% narrowing in the distal LAD with mild to moderate disease in the circ and RCA b. 5/6 PCI/DESx1 to mLcx  . Dyslipidemia   . ED (erectile dysfunction)   . Hearing loss   . Hyperplastic colon polyp   . Hypoglycemia   . Orthostatic hypotension   . REM sleep behavior disorder    not tested    Past Surgical History:  Procedure Laterality Date  . CARDIOVERSION N/A 04/23/2016   Procedure: CARDIOVERSION;  Surgeon: [name], MD;  Location: [place];  Service: Cardiovascular;  Laterality: N/A;  . CARDIOVERSION N/A 08/21/2020   Procedure: CARDIOVERSION;  Surgeon: Dorothy Spark, MD;  Location: [facility] ENDOSCOPY;  Service: Cardiovascular;  Laterality: N/A;  . COLONOSCOPY    . CORONARY ANGIOPLASTY WITH STENT PLACEMENT    . CORONARY STENT INTERVENTION N/A 11/15/2018   Procedure: CORONARY STENT INTERVENTION;  Surgeon: Burnell Blanks, MD;  Location: [place] CV LAB;  Service: Cardiovascular;  Laterality: N/A;  . LEFT HEART CATH AND CORONARY ANGIOGRAPHY N/A 11/15/2018   Procedure: LEFT HEART CATH AND CORONARY ANGIOGRAPHY;  Surgeon: Burnell Blanks, MD;  Location: [place] CV LAB;  Service: Cardiovascular;  Laterality: N/A;  . LEFT HEART CATH AND CORONARY ANGIOGRAPHY N/A 06/25/2019   Procedure: LEFT HEART CATH AND CORONARY ANGIOGRAPHY;  Surgeon: Martinique, Peter M, MD;  Location: [place] [facility];  Service: Cardiovascular;  Laterality: N/A;  . POLYPECTOMY    . TEE WITHOUT CARDIOVERSION N/A  04/23/2016   Procedure: TRANSESOPHAGEAL ECHOCARDIOGRAM (TEE);  Surgeon: [name], MD;  Location: [facility] ENDOSCOPY;  Service: Cardiovascular;  Laterality: N/A;  . UPPER GASTROINTESTINAL ENDOSCOPY       Home Medications:  Prior to Admission medications   Medication Sig Start Date End Date Taking? Authorizing Provider  acetaminophen (TYLENOL) 500 MG tablet Take 500 mg by mouth every 6 (six) hours as needed for headache (pain).    [provider]  atorvastatin (LIPITOR) 20 MG tablet TAKE 1 TABLET BY MOUTH EVERY DAY Patient taking differently: Take 20 mg by mouth daily. 07/23/20   Lendon Colonel, NP  Cholecalciferol (VITAMIN D-3) 1000 UNITS CAPS Take 1,000 Units by mouth daily.    [provider]  famotidine (PEPCID) 20 MG tablet Take 1 tablet (20 mg total) by mouth 2 (two) times daily as needed for heartburn or indigestion. 06/19/19   Martinique, Peter M, MD  fluticasone (FLONASE) 50 MCG/ACT nasal spray Place 1 spray into both nostrils daily as needed (seasonal allergies).     [provider]  loratadine (CLARITIN) 10 MG tablet Take 10 mg by mouth daily.    [provider]  Melatonin 3 MG TABS Take 6 mg by mouth every evening.    [provider]  midodrine (PROAMATINE) 5 MG tablet Take 5 mg by mouth daily as needed (low blood pressure). 08/05/20   [provider]  polyethylene glycol (MIRALAX / GLYCOLAX) 17 g packet Take 17 g by mouth daily as needed for mild constipation.    [provider]  Rivaroxaban (XARELTO) 15 MG TABS tablet Take 1 tablet (15 mg total) by mouth daily with supper. 05/23/20   Martinique, Peter M, MD  Wheat Dextrin (BENEFIBER PO) Take 30 mLs by mouth daily.    [provider]    Inpatient Medications: Scheduled Meds: . sodium chloride flush  3 mL Intravenous Q12H   Continuous Infusions:  PRN Meds:   Allergies:    Allergies  Allergen Reactions  . Penicillins Rash    Did it involve swelling of the  face/tongue/throat, SOB, or low BP? No Did it involve sudden or severe rash/hives, skin peeling, or any reaction on the inside of your mouth or nose? No Did you need to seek medical attention at a hospital or doctor's office? No When did it last happen?50+ years  If all above answers are "NO", may proceed with cephalosporin use.     Social History:   Social History   Socioeconomic History  . Marital status: Married    Spouse name: Sheria Lang  . Number of children: Not on file  . Years of education: Not on file  . Highest education level: Professional school degree (e.g., MD, DDS, DVM, JD)  Occupational History    Comment: retired  CE  Tobacco Use  . Smoking status: Never Smoker  . Smokeless tobacco: Never Used  Vaping Use  . Vaping Use: Never used  Substance and Sexual Activity  . Alcohol use: Yes    Alcohol/week: 4.0 standard drinks    Types: 4 Glasses of wine per week  . Drug use: No  . Sexual activity: Not Currently  Other Topics Concern  . Not on file  Social History Narrative   Lives with wife   Almost no caffeine   Social Determinants of Health   Financial Resource Strain: Not on file  Food Insecurity: Not on file  Transportation Needs: Not on file  Physical Activity: Not on file  Stress: Not on file  Social Connections: Not on file  Intimate Partner Violence: Not on file    Family History:    Family History  Problem Relation Age of Onset  . Breast cancer Mother   . Colon cancer Mother   . CAD Father   . Heart attack Paternal Grandmother   . Colon cancer Brother   . Esophageal cancer Brother   . Liver cancer Brother   . Cancer Brother   . Leukemia Paternal Uncle   . Bone cancer Paternal Grandfather   . Pancreatic cancer Neg Hx   . Prostate cancer Neg Hx   . Rectal cancer Neg Hx   . Stomach cancer Neg Hx      ROS:  Please see the history of present illness.  Review of Systems  Constitutional: Negative for chills and fever.  HENT: Negative  for congestion.   Eyes: Negative for blurred vision.  Respiratory: Negative for shortness of breath.   Cardiovascular: Positive for palpitations. Negative for chest pain, orthopnea, leg swelling and PND.  Gastrointestinal:  Negative for blood in stool, melena, nausea and vomiting.  Genitourinary: Negative for hematuria.  Musculoskeletal: Positive for neck pain. Negative for falls and myalgias.  Neurological: Negative for loss of consciousness.  Endo/Heme/Allergies: Does not bruise/bleed easily.  Psychiatric/Behavioral: Negative for substance abuse.    Physical Exam/Data:   Vitals:   08/25/20 1500 08/25/20 1530 08/25/20 1600 08/25/20 1700  BP: (!) 144/101 133/89 125/90 (!) 148/89  Pulse: 66 66 63 93  Resp: 11 17 18 20   Temp:      TempSrc:      SpO2: 100% 100% 100% 100%   No intake or output data in the 24 hours ending 08/25/20 1754 Last 3 Weights 08/18/2020 08/05/2020 07/02/2020  Weight (lbs) 156 lb 6.4 oz 159 lb 158 lb 12.8 oz  Weight (kg) 70.943 kg 72.122 kg 72.031 kg     There is no height or weight on file to calculate BMI.  General: 80 y.o. male resting comfortably in no acute distress. HEENT: Normocephalic and atraumatic. Sclera clear.  Neck: Supple. No carotid bruits. No JVD. Heart: Tachycardic with irregularly irregular rhythm. Distinct S1 and S2. No murmurs, gallops, or rubs. Radial and distal pedal pulses 2+ and equal bilaterally. Lungs: No increased work of breathing. Clear to ausculation bilaterally. No wheezes, rhonchi, or rales.  Abdomen: Soft, non-distended, and non-tender to palpation. Bowel sounds present. MSK: Normal strength and tone for age. Extremities: No lower extremity edema.    Skin: Warm and dry. Neuro: Alert and oriented x3. No focal deficits. Psych: Normal affect. Responds appropriately.  EKG:  The EKG was personally reviewed and demonstrates: Rate controlled atrial fibrillation with PVC and no acute ST/T changes.  Telemetry:  Telemetry was personally  reviewed and demonstrates:  Atrial fibrillation with rates in the 70' to 90's at rest but spike to the 140' to 160's with minimal activity.  Relevant CV Studies:  Left Cardiac Catheterization 06/25/2019:  Non-stenotic Prox LAD to Mid LAD lesion was previously treated.  Previously placed Mid Cx drug eluting stent is widely patent.  Balloon angioplasty was performed.  Prox RCA lesion is 20% stenosed.  The left ventricular systolic function is normal.  LV end diastolic pressure is normal.  The left ventricular ejection fraction is 55-65% by visual estimate.   1. Nonobstructive CAD- patent stents in LAD and LCx. 2. Normal LV function 3. Normal LVEDP  Plan: Continue medical therapy.  Diagnostic Dominance: Right   _______________  Echocardiogram 12/14/2019: Impressions: 1. Left ventricular ejection fraction, by estimation, is 55 to 60%. The  left ventricle has normal function. The left ventricle has no regional  wall motion abnormalities. Left ventricular diastolic parameters were  normal. The average left ventricular  global longitudinal strain is -20.5 %. The global longitudinal strain is  normal.  2. Right ventricular systolic function is normal. The right ventricular  size is normal. There is normal pulmonary artery systolic pressure. The  estimated right ventricular systolic pressure is 14.9 mmHg.  3. The mitral valve is normal in structure. Mild mitral valve  regurgitation. No evidence of mitral stenosis.  4. The aortic valve is normal in structure. Aortic valve regurgitation is  not visualized. No aortic stenosis is present.  5. The inferior vena cava is normal in size with greater than 50%  respiratory variability, suggesting right atrial pressure of 3 mmHg. _______________  Cardiac Monitor 12/01/2019 to 12/30/2019: Findings: HR  avg 67  Min 51-Max 127  PVCs <1% PACs about 1 % AFib noted < 1%  Recommendations: Will try  low dose BB  Laboratory  Data:  High Sensitivity Troponin:  No results for input(s): TROPONINIHS in the last 720 hours.   Chemistry Recent Labs  Lab 08/25/20 1100  NA 141  K 4.2  CL 107  CO2 26  GLUCOSE 88  BUN 20  CREATININE 0.93  CALCIUM 8.9  GFRNONAA >60  ANIONGAP 8    Recent Labs  Lab 08/25/20 1100  PROT 6.6  ALBUMIN 3.6  AST 17  ALT 14  ALKPHOS 57  BILITOT 0.8   Hematology Recent Labs  Lab 08/25/20 1100  WBC 7.6  RBC 5.27  HGB 15.9  HCT 49.0  MCV 93.0  MCH 30.2  MCHC 32.4  RDW 13.7  PLT 210   BNPNo results for input(s): BNP, PROBNP in the last 168 hours.  DDimer No results for input(s): DDIMER in the last 168 hours.   Radiology/Studies:  No results found.   Assessment and Plan:   Paroxysmal Atrial Fibrillation - History of paroxysmal atrial fibrillation. Difficult to manage given significant orthostatic hypotension on Midodrine. Has not been able to tolerate low dose Lopressor or Amiodarone. S/p recent DCCV on 08/21/2020. Was doing well until yesterday 08/24/2020 when he think he went make into atrial fibrillation. Found to be back in atrial fibrillation in the ED. Rate controlled at rest but rates spike to the 140's to 150's with minimal activity. - Potassium 4.2.  - Will check Magnesium and TSH. - Echo from 11/2019 showed normal LVEF. - Recent note from [facility] mentions possible ablation or Tikosyn if patient fails DCCV. We were initially going to plan on starting Tikosyn tonight and then having EP pick up tomorrow. He has not missed any doses of his Xarelto. However, upon further review of his chart, it appears he has been under anticoagulated on reduced dose of Xarelto 15mg  at least since November. His kidney function is normal; therefore, he should be on Xarelto 20mg  daily. Will start this dose here. Will have EP see tomorrow and decide on whether they are comfortably starting Tikosyn at this point.  CAD - S/p prior stents to LAD and LCX. - No angina.  -  No aspirin given need for DOAC.  - Unable to tolerate beta-blocker given orthostatic hypotension. - Continue statin.  Orthostatic Hypotension Autonomic Dysfunction - Patient has significant history of orthostatic hypotension. He drinks salt water, takes Midodrine, and uses abdominal binder, and compression stockings every day.  - BP actually mildly hypertensive here in the ED; however, he was significantly hypotensive this morning with BP of 75/45 so he took 2 doses of Midodrine today. - Continue Midodrine 5mg  daily.  Dyslipidemia - Continue home statin.   Risk Assessment/Risk Scores:   CHA2DS2-VASc Score = 3  This indicates a 3.2% annual risk of stroke. The patient's score is based upon: CHF History: No HTN History: No Diabetes History: No Stroke History: No Vascular Disease History: Yes Age Score: 2 Gender Score: 0  Severity of Illness: The appropriate patient status for this patient is OBSERVATION. Observation status is judged to be reasonable and necessary in order to provide the required intensity of service to ensure the patient's safety. The patient's presenting symptoms, physical exam findings, and initial radiographic and laboratory data in the context of their medical condition is felt to place them at decreased risk for further clinical deterioration. Furthermore, it is anticipated that the patient will be medically stable for discharge from the hospital within 2 midnights of admission. The following factors support the patient  status of observation.   " The patient's presenting symptoms include palpitations. " The physical exam findings as above. " The initial radiographic and laboratory data are as above.     For questions or updates, please contact [place] Please consult [email or web address] for contact info under     Signed, Eppie Gibson  08/25/2020 5:57 PM   Patient seen and examined with Sande Rives PA-C.  Agree as above, with the following  exceptions and changes as noted below. Patient presented with recurrence of Afib after cardioversion last week. Hypotension, and subjective slurred speech. Gen: NAD, CV: iRRR, no murmurs, Lungs: clear, Abd: soft, Extrem: Warm, well perfused, no edema, Neuro/Psych: alert and oriented x 3, normal mood and affect. All available labs, radiology testing, previous records reviewed.   Initially planned to follow afib clinic recommendations of dofetilide loading, however concern for subtherapeutic anticoagulation now that renal function is normal. Requires xarelto 20 mg daily. With this reduced dosing of AC, would recommend EP opinion on best course of action, AAD vs ablation prior to proceeding. Contacted EP service who will plan to see in AM. Patients rates are well controlled if resting.   Elouise Munroe, MD 08/25/20 7:45 PM

## 2020-08-26 ENCOUNTER — Other Ambulatory Visit: Payer: Self-pay

## 2020-08-26 ENCOUNTER — Encounter (HOSPITAL_COMMUNITY): Payer: Self-pay | Admitting: Internal Medicine

## 2020-08-26 DIAGNOSIS — Z800 Family history of malignant neoplasm of digestive organs: Secondary | ICD-10-CM | POA: Diagnosis not present

## 2020-08-26 DIAGNOSIS — Z20822 Contact with and (suspected) exposure to covid-19: Secondary | ICD-10-CM | POA: Diagnosis not present

## 2020-08-26 DIAGNOSIS — I951 Orthostatic hypotension: Secondary | ICD-10-CM | POA: Diagnosis not present

## 2020-08-26 DIAGNOSIS — I4819 Other persistent atrial fibrillation: Secondary | ICD-10-CM | POA: Diagnosis not present

## 2020-08-26 DIAGNOSIS — E785 Hyperlipidemia, unspecified: Secondary | ICD-10-CM | POA: Diagnosis not present

## 2020-08-26 DIAGNOSIS — Z8249 Family history of ischemic heart disease and other diseases of the circulatory system: Secondary | ICD-10-CM | POA: Diagnosis not present

## 2020-08-26 DIAGNOSIS — G909 Disorder of the autonomic nervous system, unspecified: Secondary | ICD-10-CM | POA: Diagnosis not present

## 2020-08-26 DIAGNOSIS — Z7901 Long term (current) use of anticoagulants: Secondary | ICD-10-CM | POA: Diagnosis not present

## 2020-08-26 DIAGNOSIS — Z955 Presence of coronary angioplasty implant and graft: Secondary | ICD-10-CM | POA: Diagnosis not present

## 2020-08-26 DIAGNOSIS — I4891 Unspecified atrial fibrillation: Secondary | ICD-10-CM | POA: Diagnosis not present

## 2020-08-26 DIAGNOSIS — I2510 Atherosclerotic heart disease of native coronary artery without angina pectoris: Secondary | ICD-10-CM | POA: Diagnosis not present

## 2020-08-26 DIAGNOSIS — Z79899 Other long term (current) drug therapy: Secondary | ICD-10-CM | POA: Diagnosis not present

## 2020-08-26 DIAGNOSIS — Z803 Family history of malignant neoplasm of breast: Secondary | ICD-10-CM | POA: Diagnosis not present

## 2020-08-26 DIAGNOSIS — Z806 Family history of leukemia: Secondary | ICD-10-CM | POA: Diagnosis not present

## 2020-08-26 DIAGNOSIS — I10 Essential (primary) hypertension: Secondary | ICD-10-CM | POA: Diagnosis not present

## 2020-08-26 DIAGNOSIS — Z880 Allergy status to penicillin: Secondary | ICD-10-CM | POA: Diagnosis not present

## 2020-08-26 DIAGNOSIS — I480 Paroxysmal atrial fibrillation: Secondary | ICD-10-CM | POA: Diagnosis not present

## 2020-08-26 DIAGNOSIS — I081 Rheumatic disorders of both mitral and tricuspid valves: Secondary | ICD-10-CM | POA: Diagnosis not present

## 2020-08-26 DIAGNOSIS — I340 Nonrheumatic mitral (valve) insufficiency: Secondary | ICD-10-CM | POA: Diagnosis not present

## 2020-08-26 LAB — BASIC METABOLIC PANEL
Anion gap: 9 (ref 5–15)
BUN: 23 mg/dL (ref 8–23)
CO2: 22 mmol/L (ref 22–32)
Calcium: 8.8 mg/dL — ABNORMAL LOW (ref 8.9–10.3)
Chloride: 106 mmol/L (ref 98–111)
Creatinine, Ser: 0.89 mg/dL (ref 0.61–1.24)
GFR, Estimated: >60 mL/min (ref >60)
Glucose, Bld: 91 mg/dL (ref 70–99)
Potassium: 4.4 mmol/L (ref 3.5–5.1)
Sodium: 137 mmol/L (ref 135–145)

## 2020-08-26 MED ORDER — SODIUM CHLORIDE 0.9 % IV SOLN: INTRAVENOUS | Status: DC

## 2020-08-26 MED ORDER — RIVAROXABAN 20 MG PO TABS
20.0000 mg | ORAL_TABLET | Freq: Every day | ORAL | Status: DC
  Administered 2020-08-26: 20 mg via ORAL
  Filled 2020-08-26: qty 1

## 2020-08-26 NOTE — ED Notes (Signed)
Attempted report x1, RN unable to take report at this time

## 2020-08-26 NOTE — ED Notes (Signed)
Pt's cardiac monitor reading HR=150s, went into patients room. Pt at end of stretcher, legs splayed, laying on back with urinal.  Pt assisted in using urinal and repositioning in bed. Reminded pt to use call bell when needed. Pt's HR=110s upon laying back down.

## 2020-08-26 NOTE — Care Management (Signed)
1425 08-26-20 Benefits check submitted for Tikosyn. Case Manager will follow for cost and pharmacy of choice. Bethena Roys, RN,BSN Case Manager

## 2020-08-26 NOTE — H&P (View-Only) (Signed)
Cardiology Consultation:   Patient ID: Jerry Barr MRN: [id number]; DOB: 06/28/1941  Admit date: 08/25/2020 Date of Consult: 08/26/2020  PCP:  Antony Contras, MD   [place]  Cardiologist:  Peter Martinique, MD  Electrophysiologist:  Virl Axe, MD    Patient Profile:   Jerry Barr is a 80 y.o. male with a hx of HTN, HLD, CAD (DES in 2011, Most recent cath in 06/2019 showed patent stents with non-obstructive CAD), AFib, orthostatic hypotension (uses midodrine in AM's, support wear) who is being seen today for the evaluation of AFib rhythm control management at the request of Dr. Margaretann Loveless.  History of Present Illness:   Mr. Pitstick last saw Dr. Caryl Comes Dec 2021 discussed hx of little HR response concerning for more systemic autonomic insufficiency that improved with stopping amiodarone, there is mention of orthostatic symptoms much better.  Though summarized: "Notably today on orthostatic vital signs, significant heart rate increase was noted. This makes the issue of systemic dysautonomia less likely. Low voltage raises the possibility of amyloid. Will review with Dr. Shirlee More" No changes were made.  He was seen in the AFib clinic 08/18/20 with palpitations and AFib planned for DCCV >> done 08/21/20 and was successful  Returned to the ER yesterday feeling very weak, racing palpitations and low SBPs at home despite taking proamatine. Reported waking Saturday feeling well went for a walk, did noting unusual.  Sunday woke feeling poorly and knew he had gone back out of rhythm. No CP, felt his heart fast and racing, very weak, home BP 06'Y systolic and at some point felt like he has slurred speech, though this resolved as his BP improved, and denied feeling any localized weakness. NO CP No syncope He feels well currently with rate controlled AFib and mentions that for the week or so prior to getting his DCCV knew he was in AFib but as symptomatic as he was this time.  He was  admitted by cardiology service with plans to go ahead and get him start on Tikosyn, though noted his Xarelto was under-dosed with his recent Creat levels and held off   Last few Creat levels and Calc CrCl using today's weight Home Xarelto dose was 15mg  daily 11/29/19 1.37 (age 85, calc CrCl 41) 08/05/20 0.90 (calc CrCl 64) 08/18/20 1.06 (54) 08/25/20 0.93 (62) Today 0.89 (65)  AFib HX Diagnosed Oct 2017  AAD Amiodarone started 2017 >> stopped May 2021 Notes report intolerant of low dose BB 2/2 fatigue  Past Medical History:  Diagnosis Date  . Allergy   . Aortic atherosclerosis ([place])   . Atrial fibrillation ([place])   . BPH (benign prostatic hyperplasia)   . CAD (coronary artery disease)   . Cancer (HCC)    basal cell ca - face, nose and right leg  . Clotting disorder (HCC)    a fib hx - Xarelto  . Coronary artery disease    Promus drug-eluting stent to a 99% mid LAD, 40-50% narrowing in the distal LAD with mild to moderate disease in the circ and RCA b. 5/6 PCI/DESx1 to mLcx  . Dyslipidemia   . ED (erectile dysfunction)   . Hearing loss   . Hyperplastic colon polyp   . Hypoglycemia   . Orthostatic hypotension   . REM sleep behavior disorder    not tested    Past Surgical History:  Procedure Laterality Date  . CARDIOVERSION N/A 04/23/2016   Procedure: CARDIOVERSION;  Surgeon: [name], MD;  Location: [facility];  Service: Cardiovascular;  Laterality: N/A;  . CARDIOVERSION N/A 08/21/2020   Procedure: CARDIOVERSION;  Surgeon: Dorothy Spark, MD;  Location: [facility] ENDOSCOPY;  Service: Cardiovascular;  Laterality: N/A;  . COLONOSCOPY    . CORONARY ANGIOPLASTY WITH STENT PLACEMENT    . CORONARY STENT INTERVENTION N/A 11/15/2018   Procedure: CORONARY STENT INTERVENTION;  Surgeon: Burnell Blanks, MD;  Location: [facility] CV LAB;  Service: Cardiovascular;  Laterality: N/A;  . LEFT HEART CATH AND CORONARY ANGIOGRAPHY N/A 11/15/2018   Procedure: LEFT HEART CATH AND  CORONARY ANGIOGRAPHY;  Surgeon: Burnell Blanks, MD;  Location: [place] CV LAB;  Service: Cardiovascular;  Laterality: N/A;  . LEFT HEART CATH AND CORONARY ANGIOGRAPHY N/A 06/25/2019   Procedure: LEFT HEART CATH AND CORONARY ANGIOGRAPHY;  Surgeon: Martinique, Peter M, MD;  Location: [facility] CV LAB;  Service: Cardiovascular;  Laterality: N/A;  . POLYPECTOMY    . TEE WITHOUT CARDIOVERSION N/A 04/23/2016   Procedure: TRANSESOPHAGEAL ECHOCARDIOGRAM (TEE);  Surgeon: [name], MD;  Location[facility] ENDOSCOPY;  Service: Cardiovascular;  Laterality: N/A;  . UPPER GASTROINTESTINAL ENDOSCOPY       Home Medications:  Prior to Admission medications   Medication Sig Start Date End Date Taking? Authorizing Provider  acetaminophen (TYLENOL) 500 MG tablet Take 500 mg by mouth every 6 (six) hours as needed for mild pain (or headaches).   Yes [provider]  atorvastatin (LIPITOR) 20 MG tablet TAKE 1 TABLET BY MOUTH EVERY DAY Patient taking differently: Take 20 mg by mouth daily. 07/23/20  Yes Lendon Colonel, NP  Cholecalciferol (VITAMIN D-3) 1000 UNITS CAPS Take 1,000 Units by mouth daily.   Yes [provider]  famotidine (PEPCID) 20 MG tablet Take 1 tablet (20 mg total) by mouth 2 (two) times daily as needed for heartburn or indigestion. 06/19/19  Yes Martinique, Peter M, MD  fluticasone [facility]) 50 MCG/ACT nasal spray Place 1 spray into both nostrils daily as needed (seasonal allergies).    Yes [provider]  loratadine (CLARITIN) 10 MG tablet Take 10 mg by mouth daily.   Yes [provider]  Melatonin 3 MG TABS Take 6 mg by mouth at bedtime.   Yes [provider]  midodrine (PROAMATINE) 5 MG tablet Take 5 mg by mouth See admin instructions. Take 5 mg by mouth in the morning and at lunchtime 08/05/20  Yes [provider]  polyethylene glycol (MIRALAX / GLYCOLAX) 17 g packet Take 17 g by mouth daily as needed for mild constipation (MIX INTO 8  OUNCES OF WATER AND DRINK).   Yes [provider]  Rivaroxaban (XARELTO) 15 MG TABS tablet Take 1 tablet (15 mg total) by mouth daily with supper. Patient taking differently: Take 15 mg by mouth daily. 05/23/20  Yes Martinique, Peter M, MD  Wheat Dextrin (BENEFIBER PO) Take by mouth See admin instructions. Mix 2 teaspoonsful of powder into 6 ounces of water and drink once a day as needed for mild constipation or to add fiber to the diet   Yes [provider]    Inpatient Medications: Scheduled Meds: . atorvastatin  20 mg Oral Daily  . cholecalciferol  1,000 Units Oral Daily  . loratadine  10 mg Oral Daily  . melatonin  6 mg Oral QPM  . midodrine  5 mg Oral Daily  . Rivaroxaban  20 mg Oral Q supper   Continuous Infusions:  PRN Meds: acetaminophen, famotidine, nitroGLYCERIN, ondansetron (ZOFRAN) IV, polyethylene glycol  Allergies:  Allergies  Allergen Reactions  . Penicillins Rash    Did it involve swelling of the face/tongue/throat, SOB, or low BP? No Did it involve sudden or severe rash/hives, skin peeling, or any reaction on the inside of your mouth or nose? No Did you need to seek medical attention at a hospital or doctor's office? No When did it last happen?50+ years  If all above answers are "NO", may proceed with cephalosporin use.     Social History:   Social History   Socioeconomic History  . Marital status: Married    Spouse name: Sheria Lang  . Number of children: Not on file  . Years of education: Not on file  . Highest education level: Professional school degree (e.g., MD, DDS, DVM, JD)  Occupational History    Comment: retired  CE  Tobacco Use  . Smoking status: Never Smoker  . Smokeless tobacco: Never Used  Vaping Use  . Vaping Use: Never used  Substance and Sexual Activity  . Alcohol use: Yes    Alcohol/week: 4.0 standard drinks    Types: 4 Glasses of wine per week  . Drug use: No  . Sexual activity: Not Currently  Other Topics  Concern  . Not on file  Social History Narrative   Lives with wife   Almost no caffeine   Social Determinants of Health   Financial Resource Strain: Not on file  Food Insecurity: Not on file  Transportation Needs: Not on file  Physical Activity: Not on file  Stress: Not on file  Social Connections: Not on file  Intimate Partner Violence: Not on file    Family History:   Family History  Problem Relation Age of Onset  . Breast cancer Mother   . Colon cancer Mother   . CAD Father   . Heart attack Paternal Grandmother   . Colon cancer Brother   . Esophageal cancer Brother   . Liver cancer Brother   . Cancer Brother   . Leukemia Paternal Uncle   . Bone cancer Paternal Grandfather   . Pancreatic cancer Neg Hx   . Prostate cancer Neg Hx   . Rectal cancer Neg Hx   . Stomach cancer Neg Hx      ROS:  Please see the history of present illness.  All other ROS reviewed and negative.     Physical Exam/Data:   Vitals:   08/26/20 0230 08/26/20 0400 08/26/20 0600 08/26/20 0645  BP: 105/80 107/78 116/71 109/79  Pulse: 71 68 65 86  Resp: 16 17 16 20   Temp:   98.5 F (36.9 C) 98.4 F (36.9 C)  TempSrc:   Oral Oral  SpO2: 97% 96% 98% 99%  Weight:    69 kg  Height:    6' (1.829 m)   No intake or output data in the 24 hours ending 08/26/20 0814 Last 3 Weights 08/26/2020 08/18/2020 08/05/2020  Weight (lbs) 152 lb 1.9 oz 156 lb 6.4 oz 159 lb  Weight (kg) 69 kg 70.943 kg 72.122 kg     Body mass index is 20.63 kg/m.  General:  Well nourished, well developed, in no acute distress HEENT: normal Lymph: no adenopathy Neck: no JVD Endocrine:  No thryomegaly Vascular: No carotid bruits; FA pulses 2+ bilaterally without bruits  Cardiac:  irreg-irreg; no murmurs, gallops or rubs Lungs:  CTA b/l, no wheezing, rhonchi or rales  Abd: soft, nontender Ext: no edema Musculoskeletal:  No deformities Skin: warm and dry  Neuro:  no focal abnormalities  noted Psych:  Normal affect   EKG:   The EKG was personally reviewed and demonstrates:   AFib 77bpm, PVC, QTc 45ms Today AFib 75bpm, QTc 464ms  08/21/20 SR 74bpm, QTc 453ms  Telemetry:  Telemetry was personally reviewed and demonstrates:   AFib 70's, did have RVR 140's earlier in his stay  Relevant CV Studies:  Event monitor 01/01/20: Study Highlights  Indication: palpitations  Duration: 30d  Findings HR avg 67 Min 51-Max 127  PVCs <1% PACs about 1 % AFib noted < 1% Recommendations Will try low dose BB   12/14/2019: TTE IMPRESSIONS  1. Left ventricular ejection fraction, by estimation, is 55 to 60%. The  left ventricle has normal function. The left ventricle has no regional  wall motion abnormalities. Left ventricular diastolic parameters were  normal. The average left ventricular  global longitudinal strain is -20.5 %. The global longitudinal strain is  normal.  2. Right ventricular systolic function is normal. The right ventricular  size is normal. There is normal pulmonary artery systolic pressure. The  estimated right ventricular systolic pressure is 16.1 mmHg.  3. The mitral valve is normal in structure. Mild mitral valve  regurgitation. No evidence of mitral stenosis.  4. The aortic valve is normal in structure. Aortic valve regurgitation is  not visualized. No aortic stenosis is present.  5. The inferior vena cava is normal in size with greater than 50%  respiratory variability, suggesting right atrial pressure of 3 mmHg.   Left Atrium: Left atrial size was normal in size.    06/25/2019: LHC  Non-stenotic Prox LAD to Mid LAD lesion was previously treated.  Previously placed Mid Cx drug eluting stent is widely patent.  Balloon angioplasty was performed.  Prox RCA lesion is 20% stenosed.  The left ventricular systolic function is normal.  LV end diastolic pressure is normal.  The left ventricular ejection fraction is 55-65% by visual estimate.   1. Nonobstructive CAD- patent  stents in LAD and LCx. 2. Normal LV function 3. Normal LVEDP  Plan: continue medical therapy.     Laboratory Data:  High Sensitivity Troponin:  No results for input(s): TROPONINIHS in the last 720 hours.   Chemistry Recent Labs  Lab 08/25/20 1100 08/26/20 0435  NA 141 137  K 4.2 4.4  CL 107 106  CO2 26 22  GLUCOSE 88 91  BUN 20 23  CREATININE 0.93 0.89  CALCIUM 8.9 8.8*  GFRNONAA >60 >60  ANIONGAP 8 9    Recent Labs  Lab 08/25/20 1100  PROT 6.6  ALBUMIN 3.6  AST 17  ALT 14  ALKPHOS 57  BILITOT 0.8   Hematology Recent Labs  Lab 08/25/20 1100  WBC 7.6  RBC 5.27  HGB 15.9  HCT 49.0  MCV 93.0  MCH 30.2  MCHC 32.4  RDW 13.7  PLT 210   BNPNo results for input(s): BNP, PROBNP in the last 168 hours.  DDimer No results for input(s): DDIMER in the last 168 hours.   Radiology/Studies:  No results found.   Assessment and Plan:   1. Paroxysmal AFib     CHA2DS2Vasc is 4, on Xarelto  He took Xarelto 15mg  yesterday AM at home Has not yet received 20mg  dose, will order for now  The patient would really like to be considered for ablation Last echo June 2021 with preserved LVEF, and LA described as normal in size. (measured 3.75cm)  He thinks he would be able to tolerate amiodarone in the short term mentioned the  400mg  dose is what made him feel poorly, though would consider Tikosyn as well. He is most comfortable with holding off any rhythm control until fully anticoagulated   Dr. Rayann Heman has seen and examined the patient, will plan for TEE > Tikosyn Unfortunately 1st available TEE opening is 0800 Thursday. Will hold NPO after MN tonight in case they have a cancellation tomorrow, endo will let me know if an available slot opens for tomorrow  The patient is aware, he has had TEE before, we discussed procedure, potential risks/benefits I have consulted case management for cost evaluation for dofetilide  2. CAD     No symptoms of angina     Stable CAD  by cath 2020   4. HTN 3. Known orthostatic symptoms     On midodrine    Risk Assessment/Risk Scores:  { For questions or updates, please contact [facility] Please consult [email or web address] for contact info under    Signed, Baldwin Jamaica, PA-C  08/26/2020 8:14 AM   I have seen, examined the patient, and reviewed the above assessment and plan.  Changes to above are made where necessary.  On exam, iRRR.  Will plan to start tikosyn after TEE.  Increase xarelto to therapeutic dose. EP to follow  Co Sign: Thompson Grayer, MD 08/26/2020 2:01 PM

## 2020-08-26 NOTE — Consult Note (Addendum)
Cardiology Consultation:   Patient ID: Jerry Barr MRN: [id number]; DOB: 02/25/1941  Admit date: 08/25/2020 Date of Consult: 08/26/2020  PCP:  Barr, David, MD   [facility]  Cardiologist:  Jerry Jordan, MD  Electrophysiologist:  Jerry Klein, MD    Patient Profile:   Jerry Barr is a 80 y.o. male with a hx of HTN, HLD, CAD (DES in 2011, Most recent cath in 06/2019 showed patent stents with non-obstructive CAD), AFib, orthostatic hypotension (uses midodrine in AM's, support wear) who is being seen today for the evaluation of AFib rhythm control management at the request of Jerry Barr.  History of Present Illness:   Jerry Barr last saw Jerry Barr Dec 2021 discussed hx of little HR response concerning for more systemic autonomic insufficiency that improved with stopping amiodarone, there is mention of orthostatic symptoms much better.  Though summarized: "Notably today on orthostatic vital signs, significant heart rate increase was noted. This makes the issue of systemic dysautonomia less likely. Low voltage raises the possibility of amyloid. Will review with Jerry Barr" No changes were made.  He was seen in the AFib clinic 08/18/20 with palpitations and AFib planned for DCCV >> done 08/21/20 and was successful  Returned to the ER yesterday feeling very weak, racing palpitations and low SBPs at home despite taking proamatine. Reported waking Saturday feeling well went for a walk, did noting unusual.  Sunday woke feeling poorly and knew he had gone back out of rhythm. No CP, felt his heart fast and racing, very weak, home BP 70's systolic and at some point felt like he has slurred speech, though this resolved as his BP improved, and denied feeling any localized weakness. NO CP No syncope He feels well currently with rate controlled AFib and mentions that for the week or so prior to getting his DCCV knew he was in AFib but as symptomatic as he was this time.  He was  admitted by cardiology service with plans to go ahead and get him start on Tikosyn, though noted his Xarelto was under-dosed with his recent Creat levels and held off   Last few Creat levels and Calc CrCl using today's weight Home Xarelto dose was 15mg daily 11/29/19 1.37 (age 68, calc CrCl 41) 08/05/20 0.90 (calc CrCl 64) 08/18/20 1.06 (55) 08/25/20 0.93 (62) Today 0.89 (66)  AFib HX Diagnosed Oct 2017  AAD Amiodarone started 2017 >> stopped May 2021 Notes report intolerant of low dose BB 2/2 fatigue  Past Medical History:  Diagnosis Date  . Allergy   . Aortic atherosclerosis (HCC)   . Atrial fibrillation (HCC)   . BPH (benign prostatic hyperplasia)   . CAD (coronary artery disease)   . Cancer (HCC)    basal cell ca - face, nose and right leg  . Clotting disorder (HCC)    a fib hx - Xarelto  . Coronary artery disease    Promus drug-eluting stent to a 99% mid LAD, 40-50% narrowing in the distal LAD with mild to moderate disease in the circ and RCA b. 5/6 PCI/DESx1 to mLcx  . Dyslipidemia   . ED (erectile dysfunction)   . Hearing loss   . Hyperplastic colon polyp   . Hypoglycemia   . Orthostatic hypotension   . REM sleep behavior disorder    not tested    Past Surgical History:  Procedure Laterality Date  . CARDIOVERSION N/A 04/23/2016   Procedure: CARDIOVERSION;  Surgeon: Mark C Skains, MD;  Location: [facility] ENDOSCOPY;    Service: Cardiovascular;  Laterality: N/A;  . CARDIOVERSION N/A 08/21/2020   Procedure: CARDIOVERSION;  Surgeon: Dorothy Spark, MD;  Location: [facility] ENDOSCOPY;  Service: Cardiovascular;  Laterality: N/A;  . COLONOSCOPY    . CORONARY ANGIOPLASTY WITH STENT PLACEMENT    . CORONARY STENT INTERVENTION N/A 11/15/2018   Procedure: CORONARY STENT INTERVENTION;  Surgeon: Burnell Blanks, MD;  Location: [place] CV LAB;  Service: Cardiovascular;  Laterality: N/A;  . LEFT HEART CATH AND CORONARY ANGIOGRAPHY N/A 11/15/2018   Procedure: LEFT HEART CATH AND  CORONARY ANGIOGRAPHY;  Surgeon: Burnell Blanks, MD;  Location: [place] [facility];  Service: Cardiovascular;  Laterality: N/A;  . LEFT HEART CATH AND CORONARY ANGIOGRAPHY N/A 06/25/2019   Procedure: LEFT HEART CATH AND CORONARY ANGIOGRAPHY;  Surgeon: Martinique, Jerry M, MD;  Location: [place] [facility];  Service: Cardiovascular;  Laterality: N/A;  . POLYPECTOMY    . TEE WITHOUT CARDIOVERSION N/A 04/23/2016   Procedure: TRANSESOPHAGEAL ECHOCARDIOGRAM (TEE);  Surgeon: [name], MD;  Location: [facility] ENDOSCOPY;  Service: Cardiovascular;  Laterality: N/A;  . UPPER GASTROINTESTINAL ENDOSCOPY       Home Medications:  Prior to Admission medications   Medication Sig Start Date End Date Taking? Authorizing Provider  acetaminophen (TYLENOL) 500 MG tablet Take 500 mg by mouth every 6 (six) hours as needed for mild pain (or headaches).   Yes [provider]  atorvastatin (LIPITOR) 20 MG tablet TAKE 1 TABLET BY MOUTH EVERY DAY Patient taking differently: Take 20 mg by mouth daily. 07/23/20  Yes Lendon Colonel, NP  Cholecalciferol (VITAMIN D-3) 1000 UNITS CAPS Take 1,000 Units by mouth daily.   Yes [provider]  famotidine (PEPCID) 20 MG tablet Take 1 tablet (20 mg total) by mouth 2 (two) times daily as needed for heartburn or indigestion. 06/19/19  Yes Martinique, Jerry M, MD  fluticasone [facility]) 50 MCG/ACT nasal spray Place 1 spray into both nostrils daily as needed (seasonal allergies).    Yes [provider]  loratadine (CLARITIN) 10 MG tablet Take 10 mg by mouth daily.   Yes [provider]  Melatonin 3 MG TABS Take 6 mg by mouth at bedtime.   Yes [provider]  midodrine (PROAMATINE) 5 MG tablet Take 5 mg by mouth See admin instructions. Take 5 mg by mouth in the morning and at lunchtime 08/05/20  Yes [provider]  polyethylene glycol (MIRALAX / GLYCOLAX) 17 g packet Take 17 g by mouth daily as needed for mild constipation (MIX INTO 8  OUNCES OF WATER AND DRINK).   Yes [provider]  Rivaroxaban (XARELTO) 15 MG TABS tablet Take 1 tablet (15 mg total) by mouth daily with supper. Patient taking differently: Take 15 mg by mouth daily. 05/23/20  Yes Martinique, Jerry M, MD  Wheat Dextrin (BENEFIBER PO) Take by mouth See admin instructions. Mix 2 teaspoonsful of powder into 6 ounces of water and drink once a day as needed for mild constipation or to add fiber to the diet   Yes [provider]    Inpatient Medications: Scheduled Meds: . atorvastatin  20 mg Oral Daily  . cholecalciferol  1,000 Units Oral Daily  . loratadine  10 mg Oral Daily  . melatonin  6 mg Oral QPM  . midodrine  5 mg Oral Daily  . Rivaroxaban  20 mg Oral Q supper   Continuous Infusions:  PRN Meds: acetaminophen, famotidine, nitroGLYCERIN, ondansetron (ZOFRAN) IV, polyethylene glycol  Allergies:  Allergies  Allergen Reactions  . Penicillins Rash    Did it involve swelling of the face/tongue/throat, SOB, or low BP? No Did it involve sudden or severe rash/hives, skin peeling, or any reaction on the inside of your mouth or nose? No Did you need to seek medical attention at a hospital or doctor's office? No When did it last happen?50+ years  If all above answers are "NO", may proceed with cephalosporin use.     Social History:   Social History   Socioeconomic History  . Marital status: Married    Spouse name: Sheria Lang  . Number of children: Not on file  . Years of education: Not on file  . Highest education level: Professional school degree (e.g., MD, DDS, DVM, JD)  Occupational History    Comment: retired  CE  Tobacco Use  . Smoking status: Never Smoker  . Smokeless tobacco: Never Used  Vaping Use  . Vaping Use: Never used  Substance and Sexual Activity  . Alcohol use: Yes    Alcohol/week: 4.0 standard drinks    Types: 4 Glasses of wine per week  . Drug use: No  . Sexual activity: Not Currently  Other Topics  Concern  . Not on file  Social History Narrative   Lives with wife   Almost no caffeine   Social Determinants of Health   Financial Resource Strain: Not on file  Food Insecurity: Not on file  Transportation Needs: Not on file  Physical Activity: Not on file  Stress: Not on file  Social Connections: Not on file  Intimate Partner Violence: Not on file    Family History:   Family History  Problem Relation Age of Onset  . Breast cancer Mother   . Colon cancer Mother   . CAD Father   . Heart attack Paternal Grandmother   . Colon cancer Brother   . Esophageal cancer Brother   . Liver cancer Brother   . Cancer Brother   . Leukemia Paternal Uncle   . Bone cancer Paternal Grandfather   . Pancreatic cancer Neg Hx   . Prostate cancer Neg Hx   . Rectal cancer Neg Hx   . Stomach cancer Neg Hx      ROS:  Please see the history of present illness.  All other ROS reviewed and negative.     Physical Exam/Data:   Vitals:   08/26/20 0230 08/26/20 0400 08/26/20 0600 08/26/20 0645  BP: 105/80 107/78 116/71 109/79  Pulse: 71 68 65 86  Resp: 16 17 16 20   Temp:   98.5 F (36.9 C) 98.4 F (36.9 C)  TempSrc:   Oral Oral  SpO2: 97% 96% 98% 99%  Weight:    69 kg  Height:    6' (1.829 m)   No intake or output data in the 24 hours ending 08/26/20 0814 Last 3 Weights 08/26/2020 08/18/2020 08/05/2020  Weight (lbs) 152 lb 1.9 oz 156 lb 6.4 oz 159 lb  Weight (kg) 69 kg 70.943 kg 72.122 kg     Body mass index is 20.63 kg/m.  General:  Well nourished, well developed, in no acute distress HEENT: normal Lymph: no adenopathy Neck: no JVD Endocrine:  No thryomegaly Vascular: No carotid bruits; FA pulses 2+ bilaterally without bruits  Cardiac:  irreg-irreg; no murmurs, gallops or rubs Lungs:  CTA b/l, no wheezing, rhonchi or rales  Abd: soft, nontender Ext: no edema Musculoskeletal:  No deformities Skin: warm and dry  Neuro:  no focal abnormalities  noted Psych:  Normal affect   EKG:   The EKG was personally reviewed and demonstrates:   AFib 77bpm, PVC, QTc 415ms Today AFib 75bpm, QTc 437ms  08/21/20 SR 74bpm, QTc 431ms  Telemetry:  Telemetry was personally reviewed and demonstrates:   AFib 70's, did have RVR 140's earlier in his stay  Relevant CV Studies:  Event monitor 01/01/20: Study Highlights  Indication: palpitations  Duration: 30d  Findings HR avg 67 Min 51-Max 127  PVCs <1% PACs about 1 % AFib noted < 1% Recommendations Will try low dose BB   12/14/2019: TTE IMPRESSIONS  1. Left ventricular ejection fraction, by estimation, is 55 to 60%. The  left ventricle has normal function. The left ventricle has no regional  wall motion abnormalities. Left ventricular diastolic parameters were  normal. The average left ventricular  global longitudinal strain is -20.5 %. The global longitudinal strain is  normal.  2. Right ventricular systolic function is normal. The right ventricular  size is normal. There is normal pulmonary artery systolic pressure. The  estimated right ventricular systolic pressure is 50.5 mmHg.  3. The mitral valve is normal in structure. Mild mitral valve  regurgitation. No evidence of mitral stenosis.  4. The aortic valve is normal in structure. Aortic valve regurgitation is  not visualized. No aortic stenosis is present.  5. The inferior vena cava is normal in size with greater than 50%  respiratory variability, suggesting right atrial pressure of 3 mmHg.   Left Atrium: Left atrial size was normal in size.    06/25/2019: LHC  Non-stenotic Prox LAD to Mid LAD lesion was previously treated.  Previously placed Mid Cx drug eluting stent is widely patent.  Balloon angioplasty was performed.  Prox RCA lesion is 20% stenosed.  The left ventricular systolic function is normal.  LV end diastolic pressure is normal.  The left ventricular ejection fraction is 55-65% by visual estimate.   1. Nonobstructive CAD- patent  stents in LAD and LCx. 2. Normal LV function 3. Normal LVEDP  Plan: continue medical therapy.     Laboratory Data:  High Sensitivity Troponin:  No results for input(s): TROPONINIHS in the last 720 hours.   Chemistry Recent Labs  Lab 08/25/20 1100 08/26/20 0435  NA 141 137  K 4.2 4.4  CL 107 106  CO2 26 22  GLUCOSE 88 91  BUN 20 23  CREATININE 0.93 0.89  CALCIUM 8.9 8.8*  GFRNONAA >60 >60  ANIONGAP 8 9    Recent Labs  Lab 08/25/20 1100  PROT 6.6  ALBUMIN 3.6  AST 17  ALT 14  ALKPHOS 57  BILITOT 0.8   Hematology Recent Labs  Lab 08/25/20 1100  WBC 7.6  RBC 5.27  HGB 15.9  HCT 49.0  MCV 93.0  MCH 30.2  MCHC 32.4  RDW 13.7  PLT 210   BNPNo results for input(s): BNP, PROBNP in the last 168 hours.  DDimer No results for input(s): DDIMER in the last 168 hours.   Radiology/Studies:  No results found.   Assessment and Plan:   1. Paroxysmal AFib     CHA2DS2Vasc is 4, on Xarelto  He took Xarelto 15mg  yesterday AM at home Has not yet received 20mg  dose, will order for now  The patient would really like to be considered for ablation Last echo June 2021 with preserved LVEF, and LA described as normal in size. (measured 3.75cm)  He thinks he would be able to tolerate amiodarone in the short term mentioned the  400mg  dose is what made him feel poorly, though would consider Tikosyn as well. He is most comfortable with holding off any rhythm control until fully anticoagulated   Dr. Rayann Heman has seen and examined the patient, will plan for TEE > Tikosyn Unfortunately 1st available TEE opening is 0800 Thursday. Will hold NPO after MN tonight in case they have a cancellation tomorrow, endo will let me know if an available slot opens for tomorrow  The patient is aware, he has had TEE before, we discussed procedure, potential risks/benefits I have consulted case management for cost evaluation for dofetilide  2. CAD     No symptoms of angina     Stable CAD  by cath 2020   4. HTN 3. Known orthostatic symptoms     On midodrine    Risk Assessment/Risk Scores:  { For questions or updates, please contact [facility] Please consult [email or web address] for contact info under    Signed, Baldwin Jamaica, PA-C  08/26/2020 8:14 AM   I have seen, examined the patient, and reviewed the above assessment and plan.  Changes to above are made where necessary.  On exam, iRRR.  Will plan to start tikosyn after TEE.  Increase xarelto to therapeutic dose. EP to follow  Co Sign: Thompson Grayer, MD 08/26/2020 2:01 PM

## 2020-08-27 ENCOUNTER — Inpatient Hospital Stay (HOSPITAL_COMMUNITY): Payer: Medicare Other | Admitting: Registered Nurse

## 2020-08-27 ENCOUNTER — Encounter (HOSPITAL_COMMUNITY): Payer: Self-pay | Admitting: Internal Medicine

## 2020-08-27 ENCOUNTER — Encounter (HOSPITAL_COMMUNITY)
Admission: EM | Disposition: A | Discharge status: Home / Self Care | Payer: Self-pay | Source: Home / Self Care | Attending: Internal Medicine

## 2020-08-27 ENCOUNTER — Inpatient Hospital Stay (HOSPITAL_COMMUNITY): Payer: Medicare Other

## 2020-08-27 DIAGNOSIS — I340 Nonrheumatic mitral (valve) insufficiency: Secondary | ICD-10-CM

## 2020-08-27 DIAGNOSIS — I2510 Atherosclerotic heart disease of native coronary artery without angina pectoris: Secondary | ICD-10-CM | POA: Diagnosis not present

## 2020-08-27 DIAGNOSIS — I10 Essential (primary) hypertension: Secondary | ICD-10-CM | POA: Diagnosis not present

## 2020-08-27 DIAGNOSIS — I4891 Unspecified atrial fibrillation: Secondary | ICD-10-CM | POA: Diagnosis not present

## 2020-08-27 HISTORY — PX: BUBBLE STUDY: SHX6837

## 2020-08-27 HISTORY — PX: TEE WITHOUT CARDIOVERSION: SHX5443

## 2020-08-27 LAB — BASIC METABOLIC PANEL
Anion gap: 10 (ref 5–15)
BUN: 21 mg/dL (ref 8–23)
CO2: 25 mmol/L (ref 22–32)
Calcium: 8.7 mg/dL — ABNORMAL LOW (ref 8.9–10.3)
Chloride: 103 mmol/L (ref 98–111)
Creatinine, Ser: 0.97 mg/dL (ref 0.61–1.24)
GFR, Estimated: >60 mL/min (ref >60)
Glucose, Bld: 94 mg/dL (ref 70–99)
Potassium: 4 mmol/L (ref 3.5–5.1)
Sodium: 138 mmol/L (ref 135–145)

## 2020-08-27 LAB — MAGNESIUM: Magnesium: 2.2 mg/dL (ref 1.7–2.4)

## 2020-08-27 SURGERY — ECHOCARDIOGRAM, TRANSESOPHAGEAL
Anesthesia: Monitor Anesthesia Care

## 2020-08-27 MED ORDER — LIDOCAINE 2% (20 MG/ML) 5 ML SYRINGE
INTRAMUSCULAR | Status: DC | PRN
  Administered 2020-08-27: 60 mg via INTRAVENOUS

## 2020-08-27 MED ORDER — SODIUM CHLORIDE 0.9% FLUSH
3.0000 mL | Freq: Two times a day (BID) | INTRAVENOUS | Status: DC
  Administered 2020-08-27 – 2020-08-31 (×7): 3 mL via INTRAVENOUS

## 2020-08-27 MED ORDER — DOFETILIDE 500 MCG PO CAPS
500.0000 ug | ORAL_CAPSULE | Freq: Two times a day (BID) | ORAL | Status: DC
  Administered 2020-08-27: 500 ug via ORAL
  Filled 2020-08-27 (×2): qty 1

## 2020-08-27 MED ORDER — SODIUM CHLORIDE 0.9 % IV SOLN: 250.0000 mL | INTRAVENOUS | Status: DC | PRN

## 2020-08-27 MED ORDER — SODIUM CHLORIDE 0.9% FLUSH: 3.0000 mL | INTRAVENOUS | Status: DC | PRN

## 2020-08-27 MED ORDER — SODIUM CHLORIDE 0.9 % IV SOLN: INTRAVENOUS | Status: DC | PRN

## 2020-08-27 MED ORDER — PROPOFOL 500 MG/50ML IV EMUL
INTRAVENOUS | Status: DC | PRN
  Administered 2020-08-27: 75 ug/kg/min via INTRAVENOUS

## 2020-08-27 MED ORDER — PROPOFOL 10 MG/ML IV BOLUS
INTRAVENOUS | Status: DC | PRN
  Administered 2020-08-27: 20 mg via INTRAVENOUS
  Administered 2020-08-27: 30 mg via INTRAVENOUS

## 2020-08-27 MED ORDER — RIVAROXABAN 20 MG PO TABS
20.0000 mg | ORAL_TABLET | Freq: Every day | ORAL | Status: DC
  Administered 2020-08-27 – 2020-08-31 (×5): 20 mg via ORAL
  Filled 2020-08-27 (×5): qty 1

## 2020-08-27 NOTE — Anesthesia Procedure Notes (Signed)
Procedure Name: MAC Date/Time: 08/27/2020 2:04 PM Performed by: Trinna Post., CRNA Pre-anesthesia Checklist: Patient identified, Emergency Drugs available, Suction available, Patient being monitored and Timeout performed Patient Re-evaluated:Patient Re-evaluated prior to induction Oxygen Delivery Method: Nasal cannula Preoxygenation: Pre-oxygenation with 100% oxygen Induction Type: IV induction Placement Confirmation: positive ETCO2

## 2020-08-27 NOTE — TOC Benefit Eligibility Note (Signed)
Transition of Care (TOC) Benefit Eligibility Note    Patient Details  Name: Jerry Barr MRN: [id number] Date of Birth: 05/20/1941   Medication/Dose: DOFETILIDE  125 MCG BID   CO-PAY-$ 105.93      250 MCG BID -CO-PAY -$108.93      500 MCG BID -CO-PAY- $105.93  Covered?: Yes (NOT COVER / NON-FORMULARY)  Tier:  (TIER- 4 DRUG)  Prescription Coverage Preferred Pharmacy: [facility]  Spoke with Person/Company/Phone Number:: J.J  @ [employer] # [phone] OPT- MEMBER  Co-Pay: $108.93  Prior Approval: No  Deductible: Met  Additional Notes: TIKOSYN : NOT COVER / NON-FORMULARY   P/A- YES  3 [phone] OPT- 3    Greenlee, Dora Phone Number: 08/27/2020, 10:07 AM     

## 2020-08-27 NOTE — Anesthesia Preprocedure Evaluation (Addendum)
Anesthesia Evaluation  Patient identified by MRN, date of birth, ID band Patient awake    Reviewed: Allergy & Precautions, H&P , NPO status , Patient's Chart, lab work & pertinent test results  Airway Mallampati: II  TM Distance: >3 FB Neck ROM: Full    Dental no notable dental hx. (+) Teeth Intact, Dental Advisory Given   Pulmonary neg pulmonary ROS,    Pulmonary exam normal breath sounds clear to auscultation       Cardiovascular + CAD and + Cardiac Stents  + dysrhythmias Atrial Fibrillation  Rhythm:Irregular Rate:Normal     Neuro/Psych negative neurological ROS  negative psych ROS   GI/Hepatic negative GI ROS, Neg liver ROS,   Endo/Other  negative endocrine ROS  Renal/GU negative Renal ROS  negative genitourinary   Musculoskeletal   Abdominal   Peds  Hematology negative hematology ROS (+)   Anesthesia Other Findings   Reproductive/Obstetrics negative OB ROS                            Anesthesia Physical Anesthesia Plan  ASA: III  Anesthesia Plan: MAC   Post-op Pain Management:    Induction: Intravenous  PONV Risk Score and Plan: 1 and Propofol infusion and Treatment may vary due to age or medical condition  Airway Management Planned: Nasal Cannula  Additional Equipment:   Intra-op Plan:   Post-operative Plan:   Informed Consent: I have reviewed the patients History and Physical, chart, labs and discussed the procedure including the risks, benefits and alternatives for the proposed anesthesia with the patient or authorized representative who has indicated his/her understanding and acceptance.     Dental advisory given  Plan Discussed with: CRNA  Anesthesia Plan Comments:         Anesthesia Quick Evaluation

## 2020-08-27 NOTE — Interval H&P Note (Signed)
History and Physical Interval Note:  08/27/2020 1:55 PM  Jerry Barr  has presented today for surgery, with the diagnosis of afib.  The various methods of treatment have been discussed with the patient and family. After consideration of risks, benefits and other options for treatment, the patient has consented to  Procedure(s): TRANSESOPHAGEAL ECHOCARDIOGRAM (TEE) (N/A) as a surgical intervention.  The patient's history has been reviewed, patient examined, no change in status, stable for surgery.  I have reviewed the patient's chart and labs.  Questions were answered to the patient's satisfaction.     Freada Bergeron

## 2020-08-27 NOTE — Progress Notes (Addendum)
Progress Note  Patient Name: Jerry Barr Date of Encounter: 08/27/2020  [facility] Cardiologist: Peter Martinique, MD   Subjective   Doing OK, no complaints  Inpatient Medications    Scheduled Meds: . atorvastatin  20 mg Oral Daily  . cholecalciferol  1,000 Units Oral Daily  . loratadine  10 mg Oral Daily  . melatonin  6 mg Oral QPM  . midodrine  5 mg Oral Daily  . Rivaroxaban  20 mg Oral Q breakfast   Continuous Infusions: . sodium chloride     PRN Meds: acetaminophen, famotidine, nitroGLYCERIN, ondansetron (ZOFRAN) IV, polyethylene glycol   Vital Signs    Vitals:   08/26/20 1143 08/26/20 1630 08/26/20 2031 08/27/20 0627  BP: (!) 124/93 111/75 107/76 110/67  Pulse: 79 70 77 67  Resp: 18 20 19    Temp: 97.8 F (36.6 C) 97.8 F (36.6 C)    TempSrc: Oral Oral Oral Oral  SpO2: 99% 99% 97% 98%  Weight:      Height:        Intake/Output Summary (Last 24 hours) at 08/27/2020 0805 Last data filed at 08/26/2020 1412 Gross per 24 hour  Intake -  Output 925 ml  Net -925 ml   Last 3 Weights 08/26/2020 08/18/2020 08/05/2020  Weight (lbs) 152 lb 1.9 oz 156 lb 6.4 oz 159 lb  Weight (kg) 69 kg 70.943 kg 72.122 kg      Telemetry    AFIB generally 70's, has had some brief RVR as well 120's-130's - Personally Reviewed  ECG   no new EKG - Personally Reviewed  Physical Exam   GEN: No acute distress.   Neck: No JVD Cardiac: irreg-irreg, no murmurs, rubs, or gallops.  Respiratory: CTA b/l GI: Soft, nontender, non-distended  MS: No edema; No deformity. Neuro:  Nonfocal  Psych: Normal affect   Labs    High Sensitivity Troponin:  No results for input(s): TROPONINIHS in the last 720 hours.    Chemistry Recent Labs  Lab 08/25/20 1100 08/26/20 0435 08/27/20 0210  NA 141 137 138  K 4.2 4.4 4.0  CL 107 106 103  CO2 26 22 25   GLUCOSE 88 91 94  BUN 20 23 21   CREATININE 0.93 0.89 0.97  CALCIUM 8.9 8.8* 8.7*  PROT 6.6  --   --   ALBUMIN 3.6  --   --   AST 17   --   --   ALT 14  --   --   ALKPHOS 57  --   --   BILITOT 0.8  --   --   GFRNONAA >60 >60 >60  ANIONGAP 8 9 10      Hematology Recent Labs  Lab 08/25/20 1100  WBC 7.6  RBC 5.27  HGB 15.9  HCT 49.0  MCV 93.0  MCH 30.2  MCHC 32.4  RDW 13.7  PLT 210    BNPNo results for input(s): BNP, PROBNP in the last 168 hours.   DDimer No results for input(s): DDIMER in the last 168 hours.   Radiology    No results found.  Cardiac Studies   Event monitor 01/01/20:Study Highlights  Indication: palpitations  Duration: 30d  Findings HR avg 67 Min 51-Max 127  PVCs <1% PACs about 1 % AFib noted < 1% Recommendations Will try low dose BB   12/14/2019: TTE IMPRESSIONS  1. Left ventricular ejection fraction, by estimation, is 55 to 60%. The  left ventricle has normal function. The left ventricle has no regional  wall  motion abnormalities. Left ventricular diastolic parameters were  normal. The average left ventricular  global longitudinal strain is -20.5 %. The global longitudinal strain is  normal.  2. Right ventricular systolic function is normal. The right ventricular  size is normal. There is normal pulmonary artery systolic pressure. The  estimated right ventricular systolic pressure is 37.0 mmHg.  3. The mitral valve is normal in structure. Mild mitral valve  regurgitation. No evidence of mitral stenosis.  4. The aortic valve is normal in structure. Aortic valve regurgitation is  not visualized. No aortic stenosis is present.  5. The inferior vena cava is normal in size with greater than 50%  respiratory variability, suggesting right atrial pressure of 3 mmHg.   Left Atrium: Left atrial size was normal in size.    06/25/2019: LHC  Non-stenotic Prox LAD to Mid LAD lesion was previously treated.  Previously placed Mid Cx drug eluting stent is widely patent.  Balloon angioplasty was performed.  Prox RCA lesion is 20% stenosed.  The left ventricular  systolic function is normal.  LV end diastolic pressure is normal.  The left ventricular ejection fraction is 55-65% by visual estimate.  1. Nonobstructive CAD- patent stents in LAD and LCx. 2. Normal LV function 3. Normal LVEDP  Plan: continue medical therapy.   Patient Profile     80 y.o. male with a hx of HTN, HLD, CAD (DES in 2011, Most recent cath in 06/2019 showed patent stents with non-obstructive CAD), AFib, orthostatic hypotension (uses midodrine in AM's, support wear) admitted with symptomatic Afib  AFib HX Diagnosed Oct 2017  AAD Amiodarone started 2017 >> stopped May 2021 Notes report intolerant of low dose BB 2/2 fatigue  Assessment & Plan    1. Paroxysmal AFib     CHA2DS2Vasc is 4, on Xarelto  He tookXarelto 15mg  AM of admission at home got 20mg  dose here yesterday (will get another 20mg  this AM) D/w the patient, he very much prefers to take his xarelto in the AM, takes with his breakfast Will make an AM med here as well  TEE this afternoon, if no thrombus will start Tikosyn tonight K+ 4.0 Mag 2.2 Creat 0.97 (calc CrCl is 60), will start with 537mcg  meds reviewed with RPH No contraindicated meds Pepcid is condition, but a PRN medicine and has not gotten any Zofran orderd on admission, not a home med, and has not gotten any  Midodrine not contraindicated, monitor HR/bradycardia  Pending benefits check outcome  2. CAD     No symptoms of angina     Stable CAD by cath 2020   4. HTN 3. Known orthostatic symptoms     On midodrine  For questions or updates, please contact [place] Please consult [email or web address] for contact info under        Signed, Baldwin Jamaica, PA-C  08/27/2020, 8:05 AM     I have seen, examined the patient, and reviewed the above assessment and plan.  Changes to above are made where necessary.  On exam, iRRR.  We will plan TEE and subsequent initiation of tikosyn today.  Continue current CAD, HTN  medicines.  Co Sign: Thompson Grayer, MD 08/27/2020

## 2020-08-27 NOTE — Procedures (Signed)
     Transesophageal Echocardiogram Note  Jerry Barr [id number] July 13, 1940  Procedure: Transesophageal Echocardiogram Indications: Atrial fibrillation  Procedure Details Consent: Obtained Time Out: Verified patient identification, verified procedure, site/side was marked, verified correct patient position, special equipment/implants available, Radiology Safety Procedures followed,  medications/allergies/relevent history reviewed, required imaging and test results available.  Performed  Medications: Propofol: 100mg  Lidocaine: 60mg   Left Ventrical:  LVEF 55-60%  Mitral Valve: Mild MR  Aortic Valve: Tricuspid. No AI  Tricuspid Valve: Normal structure. Mild TR.  Pulmonic Valve: Normal structure. No PI  Left Atrium/ Left atrial appendage: There is smoke in the LA consistent with a low flow state. No evidence of LAA thrombus.  Atrial septum: No PFO by color doppler or agitated saline contrast.  Aorta: Mild plaquing   Complications: No apparent complications Patient did tolerate procedure well.  Freada Bergeron, MD 08/27/2020, 2:21 PM

## 2020-08-27 NOTE — Progress Notes (Signed)
  Echocardiogram Echocardiogram Transesophageal has been performed.  Jerry Barr 08/27/2020, 2:37 PM

## 2020-08-27 NOTE — Progress Notes (Addendum)
Notified by Dr. Johney Frame TEE negative for thrombus. EKG with QTc 461ms K+ 4.0 Mag 2.2 Creat 0.97 (calc CrCl is 60) I have reviewed meds this AM with RPH, no contraindicated medications noted. Start Tiokosyn 540mcg this evening Pt is aware and OK with copay/cost of drug. On DCCV schedule for Friday if needed  Tommye Standard, PA-C

## 2020-08-27 NOTE — Progress Notes (Signed)
Pharmacy: Dofetilide (Tikosyn) - Initial Consult Assessment and Electrolyte Replacement  Pharmacy consulted to assist in monitoring and replacing electrolytes in this 80 y.o. male admitted on 08/25/2020 undergoing dofetilide initiation.   Assessment:  Patient Exclusion Criteria: If any screening criteria checked as "Yes", then  patient  should NOT receive dofetilide until criteria item is corrected.  If "Yes" please indicate correction plan.  YES  NO Patient  Exclusion Criteria Correction Plan   []   [x]   Baseline QTc interval is greater than or equal to 440 msec. IF above YES box checked dofetilide contraindicated unless patient has ICD; then may proceed if QTc 500-550 msec or with known ventricular conduction abnormalities may proceed with QTc 550-600 msec. QTc =  420    []   [x]   Patient is known or suspected to have a digoxin level greater than 2 ng/ml: No results found for: DIGOXIN     []   [x]   Creatinine clearance less than 20 ml/min (calculated using Cockcroft-Gault, actual body weight and serum creatinine): Estimated Creatinine Clearance: 60.3 mL/min (by C-G formula based on SCr of 0.97 mg/dL).     []   [x]  Patient has received drugs known to prolong the QT intervals within the last 48 hours (phenothiazines, tricyclics or tetracyclic antidepressants, erythromycin, H-1 antihistamines, cisapride, fluoroquinolones, azithromycin, ondansetron).   Updated information on QT prolonging agents is available to be searched on the following database:QT prolonging agents     []   [x]   Patient received a dose of hydrochlorothiazide (Oretic) alone or in any combination including triamterene (Dyazide, Maxzide) in the last 48 hours.    []   [x]  Patient received a medication known to increase dofetilide plasma concentrations prior to initial dofetilide dose:  . Trimethoprim (Primsol, Proloprim) in the last 36 hours . Verapamil (Calan, Verelan) in the last 36 hours or a sustained release  dose in the last 72 hours . Megestrol (Megace) in the last 5 days  . Cimetidine (Tagamet) in the last 6 hours . Ketoconazole (Nizoral) in the last 24 hours . Itraconazole (Sporanox) in the last 48 hours  . Prochlorperazine (Compazine) in the last 36 hours     []   [x]   Patient is known to have a history of torsades de pointes; congenital or acquired long QT syndromes.    []   [x]   Patient has received a Class 1 antiarrhythmic with less than 2 half-lives since last dose. (Disopyramide, Quinidine, Procainamide, Lidocaine, Mexiletine, Flecainide, Propafenone)    []   [x]   Patient has received amiodarone therapy in the past 3 months or amiodarone level is greater than 0.3 ng/ml.    Patient has been appropriately anticoagulated with Xarelto.  Labs:    Component Value Date/Time   K 4.0 08/27/2020 0210   MG 2.2 08/27/2020 0210     Plan: Potassium: K >/= 4: Appropriate to initiate Tikosyn, no replacement needed    Magnesium: Mg >2: Appropriate to initiate Tikosyn, no replacement needed     Thank you for allowing pharmacy to participate in this patient's care    Hildred Laser, PharmD Clinical Pharmacist **Pharmacist phone directory can now be found on [email or web address] ([email or web address]).  Listed under [place].

## 2020-08-28 ENCOUNTER — Ambulatory Visit (HOSPITAL_COMMUNITY): Payer: Medicare Other | Admitting: Physician Assistant

## 2020-08-28 DIAGNOSIS — I4891 Unspecified atrial fibrillation: Secondary | ICD-10-CM | POA: Diagnosis not present

## 2020-08-28 LAB — BASIC METABOLIC PANEL
Anion gap: 9 (ref 5–15)
BUN: 19 mg/dL (ref 8–23)
CO2: 25 mmol/L (ref 22–32)
Calcium: 8.9 mg/dL (ref 8.9–10.3)
Chloride: 102 mmol/L (ref 98–111)
Creatinine, Ser: 1.03 mg/dL (ref 0.61–1.24)
GFR, Estimated: >60 mL/min (ref >60)
Glucose, Bld: 90 mg/dL (ref 70–99)
Potassium: 4 mmol/L (ref 3.5–5.1)
Sodium: 136 mmol/L (ref 135–145)

## 2020-08-28 LAB — MAGNESIUM: Magnesium: 2.1 mg/dL (ref 1.7–2.4)

## 2020-08-28 MED ORDER — DOFETILIDE 500 MCG PO CAPS
500.0000 ug | ORAL_CAPSULE | Freq: Two times a day (BID) | ORAL | Status: DC
  Administered 2020-08-28 (×2): 500 ug via ORAL
  Filled 2020-08-28 (×2): qty 1

## 2020-08-28 NOTE — H&P (View-Only) (Signed)
Progress Note  Patient Name: Jerry Barr Date of Encounter: 08/28/2020  [facility] Cardiologist: Peter Martinique, MD   Subjective   Doing OK, no complaints  Inpatient Medications    Scheduled Meds: . atorvastatin  20 mg Oral Daily  . cholecalciferol  1,000 Units Oral Daily  . loratadine  10 mg Oral Daily  . melatonin  6 mg Oral QPM  . midodrine  5 mg Oral Daily  . Rivaroxaban  20 mg Oral Q breakfast  . sodium chloride flush  3 mL Intravenous Q12H   Continuous Infusions: . sodium chloride     PRN Meds: sodium chloride, acetaminophen, nitroGLYCERIN, polyethylene glycol, sodium chloride flush   Vital Signs    Vitals:   08/27/20 2023 08/27/20 2305 08/28/20 0515 08/28/20 0516  BP: 103/76 104/73  102/75  Pulse: 79   74  Resp: 16 16 16    Temp: 97.8 F (36.6 C) 98.4 F (36.9 C) (!) 97.4 F (36.3 C)   TempSrc: Oral Oral Oral   SpO2: 100%   97%  Weight:    68.4 kg  Height:        Intake/Output Summary (Last 24 hours) at 08/28/2020 0808 Last data filed at 08/28/2020 0521 Gross per 24 hour  Intake 300 ml  Output 1300 ml  Net -1000 ml   Last 3 Weights 08/28/2020 08/27/2020 08/26/2020  Weight (lbs) 150 lb 14.4 oz 152 lb 1.9 oz 152 lb 1.9 oz  Weight (kg) 68.448 kg 69 kg 69 kg      Telemetry    AFIB generally 70's, has had some brief RVR as well 110's-120's - Personally Reviewed  ECG    AFib 75bpm, QT 392, QTc 443ms-Personally Reviewed  Physical Exam   GEN: No acute distress.   Neck: No JVD Cardiac: irreg-irreg, no murmurs, rubs, or gallops.  Respiratory: CTA b/l GI: Soft, nontender, non-distended  MS: No edema; No deformity. Neuro:  Nonfocal  Psych: Normal affect   Labs    High Sensitivity Troponin:  No results for input(s): TROPONINIHS in the last 720 hours.    Chemistry Recent Labs  Lab 08/25/20 1100 08/26/20 0435 08/27/20 0210 08/28/20 0229  NA 141 137 138 136  K 4.2 4.4 4.0 4.0  CL 107 106 103 102  CO2 26 22 25 25   GLUCOSE 88 91 94 90   BUN 20 23 21 19   CREATININE 0.93 0.89 0.97 1.03  CALCIUM 8.9 8.8* 8.7* 8.9  PROT 6.6  --   --   --   ALBUMIN 3.6  --   --   --   AST 17  --   --   --   ALT 14  --   --   --   ALKPHOS 57  --   --   --   BILITOT 0.8  --   --   --   GFRNONAA >60 >60 >60 >60  ANIONGAP 8 9 10 9      Hematology Recent Labs  Lab 08/25/20 1100  WBC 7.6  RBC 5.27  HGB 15.9  HCT 49.0  MCV 93.0  MCH 30.2  MCHC 32.4  RDW 13.7  PLT 210    BNPNo results for input(s): BNP, PROBNP in the last 168 hours.   DDimer No results for input(s): DDIMER in the last 168 hours.   Radiology      Cardiac Studies    08/27/20: TEE IMPRESSIONS  1. Left ventricular ejection fraction, by estimation, is 55 to 60%. The  left  ventricle has normal function.  2. Right ventricular systolic function is normal. The right ventricular  size is normal. There is normal pulmonary artery systolic pressure.  3. Left atrial size was moderately dilated. No left atrial/left atrial  appendage thrombus was detected.  4. Right atrial size was mildly dilated.  5. The mitral valve is grossly normal. Mild mitral valve regurgitation.  6. The aortic valve is tricuspid. There is mild thickening of the aortic  valve. Aortic valve regurgitation is not visualized. Mild aortic valve  sclerosis is present, with no evidence of aortic valve stenosis.  7. Agitated saline contrast bubble study was negative, with no evidence  of any interatrial shunt.     Event monitor 01/01/20:Study Highlights  Indication: palpitations  Duration: 30d  Findings HR avg 67 Min 51-Max 127  PVCs <1% PACs about 1 % AFib noted < 1% Recommendations Will try low dose BB   12/14/2019: TTE IMPRESSIONS  1. Left ventricular ejection fraction, by estimation, is 55 to 60%. The  left ventricle has normal function. The left ventricle has no regional  wall motion abnormalities. Left ventricular diastolic parameters were  normal. The average left  ventricular  global longitudinal strain is -20.5 %. The global longitudinal strain is  normal.  2. Right ventricular systolic function is normal. The right ventricular  size is normal. There is normal pulmonary artery systolic pressure. The  estimated right ventricular systolic pressure is 36.6 mmHg.  3. The mitral valve is normal in structure. Mild mitral valve  regurgitation. No evidence of mitral stenosis.  4. The aortic valve is normal in structure. Aortic valve regurgitation is  not visualized. No aortic stenosis is present.  5. The inferior vena cava is normal in size with greater than 50%  respiratory variability, suggesting right atrial pressure of 3 mmHg.   Left Atrium: Left atrial size was normal in size.    06/25/2019: LHC  Non-stenotic Prox LAD to Mid LAD lesion was previously treated.  Previously placed Mid Cx drug eluting stent is widely patent.  Balloon angioplasty was performed.  Prox RCA lesion is 20% stenosed.  The left ventricular systolic function is normal.  LV end diastolic pressure is normal.  The left ventricular ejection fraction is 55-65% by visual estimate.  1. Nonobstructive CAD- patent stents in LAD and LCx. 2. Normal LV function 3. Normal LVEDP  Plan: continue medical therapy.   Patient Profile     80 y.o. male with a hx of HTN, HLD, CAD (DES in 2011, Most recent cath in 06/2019 showed patent stents with non-obstructive CAD), AFib, orthostatic hypotension (uses midodrine in AM's, support wear) admitted with symptomatic Afib  AFib HX Diagnosed Oct 2017  AAD Amiodarone started 2017 >> stopped May 2021 Notes report intolerant of low dose BB 2/2 fatigue  Assessment & Plan    1. Paroxysmal AFib     CHA2DS2Vasc is 4, on Xarelto  Xarelto 15mg  at home, increased to 20mg  dose here  TEE yesterday without thrombus Tikosyn started  K+ 4.0 Mag 2.1 Creat 1.03 (calc CrCl is 56) QTc stable  Calc CrCl wobbles some, though last  few have been 60's until today D/w Dr. Rayann Heman, encourage oral hydration (was NPO much of yesterday), c/w 568mcg today QTc stable DCCV tomorrow if needed, discussed with the pt plans, procedure, risks/benefits and he is agreeable  2. CAD     No symptoms of angina     Stable CAD by cath 2020   4. HTN 3. Known orthostatic symptoms  On midodrine  For questions or updates, please contact [place] Please consult [email or web address] for contact info under        Signed, Baldwin Jamaica, PA-C  08/28/2020, 8:08 AM    I have seen, examined the patient, and reviewed the above assessment and plan.  Changes to above are made where necessary.  On exam, IRRR S/p tee yesterday (reviewed).  Now on tikosyn.  Repeat cardioversion tomorrow if he does not convert in the interim.  Co Sign: Thompson Grayer, MD 08/28/2020 10:29 AM

## 2020-08-28 NOTE — Plan of Care (Signed)

## 2020-08-28 NOTE — Anesthesia Postprocedure Evaluation (Signed)
Anesthesia Post Note  Patient: Adriaan Maltese  Procedure(s) Performed: TRANSESOPHAGEAL ECHOCARDIOGRAM (TEE) (N/A ) BUBBLE STUDY     Patient location during evaluation: Endoscopy Anesthesia Type: MAC Level of consciousness: awake and alert Pain management: pain level controlled Vital Signs Assessment: post-procedure vital signs reviewed and stable Respiratory status: spontaneous breathing, nonlabored ventilation and respiratory function stable Cardiovascular status: stable and blood pressure returned to baseline Postop Assessment: no apparent nausea or vomiting Anesthetic complications: no   No complications documented.  Last Vitals:  Vitals:   08/28/20 0853 08/28/20 1053  BP: 96/72 108/73  Pulse:    Resp: 16 16  Temp: 36.8 C 36.9 C  SpO2:      Last Pain:  Vitals:   08/28/20 1053  TempSrc: Oral  PainSc: 0-No pain                 Bohden Dung,W. EDMOND

## 2020-08-28 NOTE — Transfer of Care (Signed)
Immediate Anesthesia Transfer of Care Note  Patient: Jerry Barr  Procedure(s) Performed: TRANSESOPHAGEAL ECHOCARDIOGRAM (TEE) (N/A ) BUBBLE STUDY  Patient Location: PACU and Endoscopy Unit  Anesthesia Type:MAC  Level of Consciousness: drowsy  Airway & Oxygen Therapy: Patient Spontanous Breathing  Post-op Assessment: Report given to RN and Post -op Vital signs reviewed and stable  Post vital signs: Reviewed and stable  Last Vitals:  Vitals Value Taken Time  BP 108/68 08/27/20 1453  Temp    Pulse 70 08/27/20 1458  Resp 15 08/27/20 1458  SpO2 100 % 08/27/20 1458  Vitals shown include unvalidated device data.  Last Pain:  Vitals:   08/28/20 0604  TempSrc:   PainSc: Asleep      Patients Stated Pain Goal: 0 (99/77/41 4239)  Complications: No complications documented.

## 2020-08-28 NOTE — Progress Notes (Signed)
Pharmacy: Dofetilide (Tikosyn) - Follow Up Assessment and Electrolyte Replacement  Pharmacy consulted to assist in monitoring and replacing electrolytes in this 80 y.o. male admitted on 08/25/2020 undergoing dofetilide initiation.  Labs:    Component Value Date/Time   K 4.0 08/28/2020 0229   MG 2.1 08/28/2020 0229     Plan: Potassium: K >/= 4: No additional supplementation needed  Magnesium: Mg > 2: No additional supplementation needed     Thank you for allowing pharmacy to participate in this patient's care   Hildred Laser, PharmD Clinical Pharmacist **Pharmacist phone directory can now be found on [email or web address] ([email or web address]).  Listed under [place].

## 2020-08-28 NOTE — Progress Notes (Addendum)
Progress Note  Patient Name: Jerry Barr Date of Encounter: 08/28/2020  [facility] Cardiologist: Peter Martinique, MD   Subjective   Doing OK, no complaints  Inpatient Medications    Scheduled Meds: . atorvastatin  20 mg Oral Daily  . cholecalciferol  1,000 Units Oral Daily  . loratadine  10 mg Oral Daily  . melatonin  6 mg Oral QPM  . midodrine  5 mg Oral Daily  . Rivaroxaban  20 mg Oral Q breakfast  . sodium chloride flush  3 mL Intravenous Q12H   Continuous Infusions: . sodium chloride     PRN Meds: sodium chloride, acetaminophen, nitroGLYCERIN, polyethylene glycol, sodium chloride flush   Vital Signs    Vitals:   08/27/20 2023 08/27/20 2305 08/28/20 0515 08/28/20 0516  BP: 103/76 104/73  102/75  Pulse: 79   74  Resp: 16 16 16    Temp: 97.8 F (36.6 C) 98.4 F (36.9 C) (!) 97.4 F (36.3 C)   TempSrc: Oral Oral Oral   SpO2: 100%   97%  Weight:    68.4 kg  Height:        Intake/Output Summary (Last 24 hours) at 08/28/2020 0808 Last data filed at 08/28/2020 0521 Gross per 24 hour  Intake 300 ml  Output 1300 ml  Net -1000 ml   Last 3 Weights 08/28/2020 08/27/2020 08/26/2020  Weight (lbs) 150 lb 14.4 oz 152 lb 1.9 oz 152 lb 1.9 oz  Weight (kg) 68.448 kg 69 kg 69 kg      Telemetry    AFIB generally 70's, has had some brief RVR as well 110's-120's - Personally Reviewed  ECG    AFib 75bpm, QT 392, QTc 475ms-Personally Reviewed  Physical Exam   GEN: No acute distress.   Neck: No JVD Cardiac: irreg-irreg, no murmurs, rubs, or gallops.  Respiratory: CTA b/l GI: Soft, nontender, non-distended  MS: No edema; No deformity. Neuro:  Nonfocal  Psych: Normal affect   Labs    High Sensitivity Troponin:  No results for input(s): TROPONINIHS in the last 720 hours.    Chemistry Recent Labs  Lab 08/25/20 1100 08/26/20 0435 08/27/20 0210 08/28/20 0229  NA 141 137 138 136  K 4.2 4.4 4.0 4.0  CL 107 106 103 102  CO2 26 22 25 25   GLUCOSE 88 91 94 90   BUN 20 23 21 19   CREATININE 0.93 0.89 0.97 1.03  CALCIUM 8.9 8.8* 8.7* 8.9  PROT 6.6  --   --   --   ALBUMIN 3.6  --   --   --   AST 17  --   --   --   ALT 14  --   --   --   ALKPHOS 57  --   --   --   BILITOT 0.8  --   --   --   GFRNONAA >60 >60 >60 >60  ANIONGAP 8 9 10 9      Hematology Recent Labs  Lab 08/25/20 1100  WBC 7.6  RBC 5.27  HGB 15.9  HCT 49.0  MCV 93.0  MCH 30.2  MCHC 32.4  RDW 13.7  PLT 210    BNPNo results for input(s): BNP, PROBNP in the last 168 hours.   DDimer No results for input(s): DDIMER in the last 168 hours.   Radiology      Cardiac Studies    08/27/20: TEE IMPRESSIONS  1. Left ventricular ejection fraction, by estimation, is 55 to 60%. The  left  ventricle has normal function.  2. Right ventricular systolic function is normal. The right ventricular  size is normal. There is normal pulmonary artery systolic pressure.  3. Left atrial size was moderately dilated. No left atrial/left atrial  appendage thrombus was detected.  4. Right atrial size was mildly dilated.  5. The mitral valve is grossly normal. Mild mitral valve regurgitation.  6. The aortic valve is tricuspid. There is mild thickening of the aortic  valve. Aortic valve regurgitation is not visualized. Mild aortic valve  sclerosis is present, with no evidence of aortic valve stenosis.  7. Agitated saline contrast bubble study was negative, with no evidence  of any interatrial shunt.     Event monitor 01/01/20:Study Highlights  Indication: palpitations  Duration: 30d  Findings HR avg 67 Min 51-Max 127  PVCs <1% PACs about 1 % AFib noted < 1% Recommendations Will try low dose BB   12/14/2019: TTE IMPRESSIONS  1. Left ventricular ejection fraction, by estimation, is 55 to 60%. The  left ventricle has normal function. The left ventricle has no regional  wall motion abnormalities. Left ventricular diastolic parameters were  normal. The average left  ventricular  global longitudinal strain is -20.5 %. The global longitudinal strain is  normal.  2. Right ventricular systolic function is normal. The right ventricular  size is normal. There is normal pulmonary artery systolic pressure. The  estimated right ventricular systolic pressure is 81.1 mmHg.  3. The mitral valve is normal in structure. Mild mitral valve  regurgitation. No evidence of mitral stenosis.  4. The aortic valve is normal in structure. Aortic valve regurgitation is  not visualized. No aortic stenosis is present.  5. The inferior vena cava is normal in size with greater than 50%  respiratory variability, suggesting right atrial pressure of 3 mmHg.   Left Atrium: Left atrial size was normal in size.    06/25/2019: LHC  Non-stenotic Prox LAD to Mid LAD lesion was previously treated.  Previously placed Mid Cx drug eluting stent is widely patent.  Balloon angioplasty was performed.  Prox RCA lesion is 20% stenosed.  The left ventricular systolic function is normal.  LV end diastolic pressure is normal.  The left ventricular ejection fraction is 55-65% by visual estimate.  1. Nonobstructive CAD- patent stents in LAD and LCx. 2. Normal LV function 3. Normal LVEDP  Plan: continue medical therapy.   Patient Profile     80 y.o. male with a hx of HTN, HLD, CAD (DES in 2011, Most recent cath in 06/2019 showed patent stents with non-obstructive CAD), AFib, orthostatic hypotension (uses midodrine in AM's, support wear) admitted with symptomatic Afib  AFib HX Diagnosed Oct 2017  AAD Amiodarone started 2017 >> stopped May 2021 Notes report intolerant of low dose BB 2/2 fatigue  Assessment & Plan    1. Paroxysmal AFib     CHA2DS2Vasc is 4, on Xarelto  Xarelto 15mg  at home, increased to 20mg  dose here  TEE yesterday without thrombus Tikosyn started  K+ 4.0 Mag 2.1 Creat 1.03 (calc CrCl is 56) QTc stable  Calc CrCl wobbles some, though last  few have been 60's until today D/w Dr. Rayann Heman, encourage oral hydration (was NPO much of yesterday), c/w 551mcg today QTc stable DCCV tomorrow if needed, discussed with the pt plans, procedure, risks/benefits and he is agreeable  2. CAD     No symptoms of angina     Stable CAD by cath 2020   4. HTN 3. Known orthostatic symptoms  On midodrine  For questions or updates, please contact [place] Please consult [email or web address] for contact info under        Signed, Baldwin Jamaica, PA-C  08/28/2020, 8:08 AM    I have seen, examined the patient, and reviewed the above assessment and plan.  Changes to above are made where necessary.  On exam, IRRR S/p tee yesterday (reviewed).  Now on tikosyn.  Repeat cardioversion tomorrow if he does not convert in the interim.  Co Sign: Thompson Grayer, MD 08/28/2020 10:29 AM

## 2020-08-28 NOTE — Progress Notes (Signed)
   08/28/20 0853  Assess: MEWS Score  Temp 98.2 F (36.8 C)  BP 96/72  ECG Heart Rate (!) 116  Resp 16  Level of Consciousness Alert  O2 Device Room Air  Patient Activity (if Appropriate) In bed  Assess: MEWS Score  MEWS Temp 0  MEWS Systolic 1  MEWS Pulse 2  MEWS RR 0  MEWS LOC 0  MEWS Score 3  MEWS Score Color Yellow  Assess: if the MEWS score is Yellow or Red  Were vital signs taken at a resting state? Yes  Focused Assessment Change from prior assessment (see assessment flowsheet)  Early Detection of Sepsis Score *See Row Information* Low  MEWS guidelines implemented *See Row Information* Yes  Treat  Pain Scale 0-10  Pain Score 0  Take Vital Signs  Increase Vital Sign Frequency  Yellow: Q 2hr X 2 then Q 4hr X 2, if remains yellow, continue Q 4hrs  Escalate  MEWS: Escalate Yellow: discuss with charge nurse/RN and consider discussing with provider and RRT  Notify: Charge Nurse/RN  Name of Charge Nurse/RN Notified Abigail, RN  Date Charge Nurse/RN Notified 08/28/20  Time Charge Nurse/RN Notified 0940  Document  Patient Outcome Other (Comment) (elevated HR)  Progress note created (see row info) Yes

## 2020-08-29 ENCOUNTER — Encounter (HOSPITAL_COMMUNITY)
Admission: EM | Disposition: A | Discharge status: Home / Self Care | Payer: Self-pay | Source: Home / Self Care | Attending: Internal Medicine

## 2020-08-29 ENCOUNTER — Inpatient Hospital Stay: Admission: RE | Admit: 2020-08-29 | Payer: Medicare Other | Source: Ambulatory Visit

## 2020-08-29 ENCOUNTER — Encounter (HOSPITAL_COMMUNITY): Payer: Self-pay | Admitting: Internal Medicine

## 2020-08-29 ENCOUNTER — Inpatient Hospital Stay (HOSPITAL_COMMUNITY): Payer: Medicare Other | Admitting: Certified Registered"

## 2020-08-29 DIAGNOSIS — I4819 Other persistent atrial fibrillation: Secondary | ICD-10-CM

## 2020-08-29 HISTORY — PX: CARDIOVERSION: SHX1299

## 2020-08-29 LAB — BASIC METABOLIC PANEL
Anion gap: 9 (ref 5–15)
BUN: 23 mg/dL (ref 8–23)
CO2: 24 mmol/L (ref 22–32)
Calcium: 9 mg/dL (ref 8.9–10.3)
Chloride: 100 mmol/L (ref 98–111)
Creatinine, Ser: 1.07 mg/dL (ref 0.61–1.24)
GFR, Estimated: >60 mL/min (ref >60)
Glucose, Bld: 86 mg/dL (ref 70–99)
Potassium: 3.9 mmol/L (ref 3.5–5.1)
Sodium: 133 mmol/L — ABNORMAL LOW (ref 135–145)

## 2020-08-29 LAB — MAGNESIUM: Magnesium: 2.3 mg/dL (ref 1.7–2.4)

## 2020-08-29 SURGERY — CARDIOVERSION
Anesthesia: General

## 2020-08-29 MED ORDER — SODIUM CHLORIDE 0.9 % IV SOLN: INTRAVENOUS | Status: DC | PRN

## 2020-08-29 MED ORDER — PROPOFOL 10 MG/ML IV BOLUS
INTRAVENOUS | Status: DC | PRN
  Administered 2020-08-29: 60 mg via INTRAVENOUS

## 2020-08-29 MED ORDER — POTASSIUM CHLORIDE CRYS ER 20 MEQ PO TBCR
20.0000 meq | EXTENDED_RELEASE_TABLET | Freq: Once | ORAL | Status: AC
  Administered 2020-08-29: 20 meq via ORAL
  Filled 2020-08-29: qty 1

## 2020-08-29 MED ORDER — LIDOCAINE 2% (20 MG/ML) 5 ML SYRINGE
INTRAMUSCULAR | Status: DC | PRN
  Administered 2020-08-29: 40 mg via INTRAVENOUS

## 2020-08-29 MED ORDER — HYDROCORTISONE 1 % EX CREA
1.0000 "application " | TOPICAL_CREAM | Freq: Three times a day (TID) | CUTANEOUS | Status: DC | PRN
  Administered 2020-08-29: 1 via TOPICAL
  Filled 2020-08-29: qty 28

## 2020-08-29 MED ORDER — DOFETILIDE 500 MCG PO CAPS
500.0000 ug | ORAL_CAPSULE | Freq: Two times a day (BID) | ORAL | Status: DC
  Administered 2020-08-29 – 2020-08-31 (×5): 500 ug via ORAL
  Filled 2020-08-29 (×5): qty 1

## 2020-08-29 NOTE — Progress Notes (Addendum)
Progress Note  Patient Name: Jerry Barr Date of Encounter: 08/29/2020  [facility] Cardiologist: Peter Martinique, MD   Subjective   Doing OK, no complaints, feels the RVR when he has it, is exertional, no CP  Inpatient Medications    Scheduled Meds: . atorvastatin  20 mg Oral Daily  . cholecalciferol  1,000 Units Oral Daily  . loratadine  10 mg Oral Daily  . melatonin  6 mg Oral QPM  . midodrine  5 mg Oral Daily  . potassium chloride  20 mEq Oral Once  . Rivaroxaban  20 mg Oral Q breakfast  . sodium chloride flush  3 mL Intravenous Q12H   Continuous Infusions: . sodium chloride     PRN Meds: sodium chloride, acetaminophen, nitroGLYCERIN, polyethylene glycol, sodium chloride flush   Vital Signs    Vitals:   08/28/20 2015 08/28/20 2251 08/29/20 0028 08/29/20 0322  BP: 101/71 115/84 102/73 116/77  Pulse: 92 88 85 67  Resp:  16 18 17   Temp:  98.1 F (36.7 C) 97.8 F (36.6 C) 98.2 F (36.8 C)  TempSrc:  Oral Axillary Oral  SpO2: 100% 99% 95% 95%  Weight:    67.8 kg  Height:        Intake/Output Summary (Last 24 hours) at 08/29/2020 0730 Last data filed at 08/29/2020 0246 Gross per 24 hour  Intake 708 ml  Output 2100 ml  Net -1392 ml   Last 3 Weights 08/29/2020 08/28/2020 08/27/2020  Weight (lbs) 149 lb 8 oz 150 lb 14.4 oz 152 lb 1.9 oz  Weight (kg) 67.813 kg 68.448 kg 69 kg      Telemetry    AFIB generally 90's-110's, has had some brief RVR as well 130's - Personally Reviewed  ECG    AFib 88bpm, QT 386, QTc 43ms-Personally Reviewed  Physical Exam   GEN: No acute distress.   Neck: No JVD Cardiac: irreg-irreg, no murmurs, rubs, or gallops.  Respiratory: CTA b/l GI: Soft, nontender, non-distended  MS: No edema; No deformity. Neuro:  Nonfocal  Psych: Normal affect   Labs    High Sensitivity Troponin:  No results for input(s): TROPONINIHS in the last 720 hours.    Chemistry Recent Labs  Lab 08/25/20 1100 08/26/20 0435 08/27/20 0210  08/28/20 0229 08/29/20 0135  NA 141   < > 138 136 133*  K 4.2   < > 4.0 4.0 3.9  CL 107   < > 103 102 100  CO2 26   < > 25 25 24   GLUCOSE 88   < > 94 90 86  BUN 20   < > 21 19 23   CREATININE 0.93   < > 0.97 1.03 1.07  CALCIUM 8.9   < > 8.7* 8.9 9.0  PROT 6.6  --   --   --   --   ALBUMIN 3.6  --   --   --   --   AST 17  --   --   --   --   ALT 14  --   --   --   --   ALKPHOS 57  --   --   --   --   BILITOT 0.8  --   --   --   --   GFRNONAA >60   < > >60 >60 >60  ANIONGAP 8   < > 10 9 9    < > = values in this interval not displayed.     Hematology Recent  Labs  Lab 08/25/20 1100  WBC 7.6  RBC 5.27  HGB 15.9  HCT 49.0  MCV 93.0  MCH 30.2  MCHC 32.4  RDW 13.7  PLT 210    BNPNo results for input(s): BNP, PROBNP in the last 168 hours.   DDimer No results for input(s): DDIMER in the last 168 hours.   Radiology      Cardiac Studies    08/27/20: TEE IMPRESSIONS  1. Left ventricular ejection fraction, by estimation, is 55 to 60%. The  left ventricle has normal function.  2. Right ventricular systolic function is normal. The right ventricular  size is normal. There is normal pulmonary artery systolic pressure.  3. Left atrial size was moderately dilated. No left atrial/left atrial  appendage thrombus was detected.  4. Right atrial size was mildly dilated.  5. The mitral valve is grossly normal. Mild mitral valve regurgitation.  6. The aortic valve is tricuspid. There is mild thickening of the aortic  valve. Aortic valve regurgitation is not visualized. Mild aortic valve  sclerosis is present, with no evidence of aortic valve stenosis.  7. Agitated saline contrast bubble study was negative, with no evidence  of any interatrial shunt.     Event monitor 01/01/20:Study Highlights  Indication: palpitations  Duration: 30d  Findings HR avg 67 Min 51-Max 127  PVCs <1% PACs about 1 % AFib noted < 1% Recommendations Will try low dose  BB   12/14/2019: TTE IMPRESSIONS  1. Left ventricular ejection fraction, by estimation, is 55 to 60%. The  left ventricle has normal function. The left ventricle has no regional  wall motion abnormalities. Left ventricular diastolic parameters were  normal. The average left ventricular  global longitudinal strain is -20.5 %. The global longitudinal strain is  normal.  2. Right ventricular systolic function is normal. The right ventricular  size is normal. There is normal pulmonary artery systolic pressure. The  estimated right ventricular systolic pressure is 65.6 mmHg.  3. The mitral valve is normal in structure. Mild mitral valve  regurgitation. No evidence of mitral stenosis.  4. The aortic valve is normal in structure. Aortic valve regurgitation is  not visualized. No aortic stenosis is present.  5. The inferior vena cava is normal in size with greater than 50%  respiratory variability, suggesting right atrial pressure of 3 mmHg.   Left Atrium: Left atrial size was normal in size.    06/25/2019: LHC  Non-stenotic Prox LAD to Mid LAD lesion was previously treated.  Previously placed Mid Cx drug eluting stent is widely patent.  Balloon angioplasty was performed.  Prox RCA lesion is 20% stenosed.  The left ventricular systolic function is normal.  LV end diastolic pressure is normal.  The left ventricular ejection fraction is 55-65% by visual estimate.  1. Nonobstructive CAD- patent stents in LAD and LCx. 2. Normal LV function 3. Normal LVEDP  Plan: continue medical therapy.   Patient Profile     80 y.o. male with a hx of HTN, HLD, CAD (DES in 2011, Most recent cath in 06/2019 showed patent stents with non-obstructive CAD), AFib, orthostatic hypotension (uses midodrine in AM's, support wear) admitted with symptomatic Afib  AFib HX Diagnosed Oct 2017  AAD Amiodarone started 2017 >> stopped May 2021 Notes report intolerant of low dose BB 2/2  fatigue  Assessment & Plan    1. Paroxysmal AFib     CHA2DS2Vasc is 4, on Xarelto     90's-1102, intermittently 130's     Orthostatic  hypotension and prior intolerances to BB noted  Xarelto 15mg  at home, increased to 20mg  dose here  TEE  without thrombus Tikosyn initiation is in progress  K+ 3.9 replaced Mag 2.3 Creat 1.07 (calc CrCl is 54) QTc stable  Calc CrCl wobbles some, discussed with Dr. Rayann Heman, will give 576mcg this AM, revisit dosing post DCCV DCCV this AM Anticipate d/c tomorrow  2. CAD     No symptoms of angina     Stable CAD by cath 2020   3. Marland Kitchen HTN Stable No change required today   For questions or updates, please contact [place] Please consult [email or web address] for contact info under        Signed, Baldwin Jamaica, PA-C  08/29/2020, 7:30 AM      I have seen, examined the patient, and reviewed the above assessment and plan.  Changes to above are made where necessary.  On exam, iRRR.  Planned for cardioversion today. Anticipate discharge to home tomorrow with close follow-up in the AF clinic.  QT stable thus far.  Blood pressure is controlled today.  Co Sign: Thompson Grayer, MD

## 2020-08-29 NOTE — Progress Notes (Addendum)
S/p successful DCCV and holding SR rates 70's-80's, occ PAC, no ectopy otherwise Post DCCV EKGs are reviewed with stable QTc In d/w Dr. Rayann Heman, will continue with 58mcg dose Labs/creat check in the AM.  I have called the patient's list [facility].  They have dofetilide in all strengths available.  Tommye Standard, PA-C   ADDEND: Appreciate case management who noted: Please see her note, for preferred pharmacy. She noted: "Patient states that the cost is too expensive and wants to use the Good Rx. Good Rx card provided to the patient and he wants Rx's for 30 day supply sent to [facility] on [address] and refills 90 day supply sent to [facility] as well. Case Manager did call the pharmacy and the 500 mcg and 250 mcg, are in stock"  Tommye Standard, PA-C

## 2020-08-29 NOTE — Care Management (Signed)
1437 08-29-20 Case Manager spoke with patient regarding Tikosyn and cost. Patient states that the cost is too expensive and wants to use the Good Rx. Good Rx card provided to the patient and he wants Rx's for 30 day supply sent to [facility] on [address] and refills 90 day supply sent to [facility] as well. Case Manager did call the pharmacy and the 500 mcg and 250 mcg, are in stock. No further needs from Case Manager at this time. Bethena Roys, RN,BSN Case Manager

## 2020-08-29 NOTE — Interval H&P Note (Signed)
History and Physical Interval Note:  08/29/2020 9:03 AM  Jerry Barr  has presented today for surgery, with the diagnosis of afib.  The various methods of treatment have been discussed with the patient and family. After consideration of risks, benefits and other options for treatment, the patient has consented to  Procedure(s): CARDIOVERSION (N/A) as a surgical intervention.  The patient's history has been reviewed, patient examined, no change in status, stable for surgery.  I have reviewed the patient's chart and labs.  Questions were answered to the patient's satisfaction.     Laina Guerrieri

## 2020-08-29 NOTE — Op Note (Signed)
Procedure: Electrical Cardioversion Indications:  Atrial Fibrillation  Procedure Details:  Consent: Risks of procedure as well as the alternatives and risks of each were explained to the (patient/caregiver).  Consent for procedure obtained.  Time Out: Verified patient identification, verified procedure, site/side was marked, verified correct patient position, special equipment/implants available, medications/allergies/relevent history reviewed, required imaging and test results available.  Performed  Patient placed on cardiac monitor, pulse oximetry, supplemental oxygen as necessary.  Sedation given: propofol 60 mg IV (Dr. Smith Robert) Pacer pads placed anterior and posterior chest.  Cardioverted 1 time(s).  Cardioversion with synchronized biphasic 120J shock.  Evaluation: Findings: Post procedure EKG shows: NSR Complications: None Patient did tolerate procedure well.  Time Spent Directly with the Patient:  30 minutes   Jerry Barr 08/29/2020, 10:01 AM

## 2020-08-29 NOTE — Anesthesia Preprocedure Evaluation (Signed)
Anesthesia Evaluation  Patient identified by MRN, date of birth, ID band Patient awake    Reviewed: Allergy & Precautions, NPO status , Patient's Chart, lab work & pertinent test results  Airway Mallampati: I  TM Distance: >3 FB Neck ROM: Full    Dental  (+) Teeth Intact, Dental Advisory Given   Pulmonary neg pulmonary ROS,    breath sounds clear to auscultation       Cardiovascular + angina + CAD   Rhythm:Irregular Rate:Abnormal     Neuro/Psych negative neurological ROS  negative psych ROS   GI/Hepatic negative GI ROS, Neg liver ROS,   Endo/Other  negative endocrine ROS  Renal/GU negative Renal ROS     Musculoskeletal negative musculoskeletal ROS (+)   Abdominal Normal abdominal exam  (+)   Peds  Hematology negative hematology ROS (+)   Anesthesia Other Findings   Reproductive/Obstetrics                             Anesthesia Physical Anesthesia Plan  ASA: III  Anesthesia Plan: General   Post-op Pain Management:    Induction:   PONV Risk Score and Plan: 0 and Propofol infusion  Airway Management Planned: Natural Airway and Simple Face Mask  Additional Equipment: None  Intra-op Plan:   Post-operative Plan:   Informed Consent: I have reviewed the patients History and Physical, chart, labs and discussed the procedure including the risks, benefits and alternatives for the proposed anesthesia with the patient or authorized representative who has indicated his/her understanding and acceptance.       Plan Discussed with: CRNA  Anesthesia Plan Comments: (Echo:  1. Left ventricular ejection fraction, by estimation, is 55 to 60%. The  left ventricle has normal function.  2. Right ventricular systolic function is normal. The right ventricular  size is normal. There is normal pulmonary artery systolic pressure.  3. Left atrial size was moderately dilated. No left  atrial/left atrial  appendage thrombus was detected.  4. Right atrial size was mildly dilated.  5. The mitral valve is grossly normal. Mild mitral valve regurgitation.  6. The aortic valve is tricuspid. There is mild thickening of the aortic  valve. Aortic valve regurgitation is not visualized. Mild aortic valve  sclerosis is present, with no evidence of aortic valve stenosis.  7. Agitated saline contrast bubble study was negative, with no evidence  of any interatrial shunt. )        Anesthesia Quick Evaluation

## 2020-08-29 NOTE — Plan of Care (Signed)

## 2020-08-29 NOTE — Transfer of Care (Signed)
Immediate Anesthesia Transfer of Care Note  Patient: Jerry Barr  Procedure(s) Performed: CARDIOVERSION (N/A )  Patient Location: Endoscopy Unit  Anesthesia Type:General  Level of Consciousness: drowsy and patient cooperative  Airway & Oxygen Therapy: Patient Spontanous Breathing  Post-op Assessment: Report given to RN  Post vital signs: Reviewed and stable  Last Vitals:  Vitals Value Taken Time  BP 146/91   Temp    Pulse 62   Resp 18   SpO2 100     Last Pain:  Vitals:   08/29/20 0939  TempSrc: Oral  PainSc: 0-No pain      Patients Stated Pain Goal: 0 (69/45/03 8882)  Complications: No complications documented.

## 2020-08-29 NOTE — Anesthesia Postprocedure Evaluation (Signed)
Anesthesia Post Note  Patient: Jerry Barr  Procedure(s) Performed: CARDIOVERSION (N/A )     Patient location during evaluation: PACU Anesthesia Type: General Level of consciousness: awake and alert Pain management: pain level controlled Vital Signs Assessment: post-procedure vital signs reviewed and stable Respiratory status: spontaneous breathing, nonlabored ventilation, respiratory function stable and patient connected to nasal cannula oxygen Cardiovascular status: blood pressure returned to baseline and stable Postop Assessment: no apparent nausea or vomiting Anesthetic complications: no   No complications documented.  Last Vitals:  Vitals:   08/29/20 1020 08/29/20 1050  BP: (!) 146/91 114/76  Pulse: 61   Resp: 14   Temp:  36.4 C  SpO2: 98% 98%    Last Pain:  Vitals:   08/29/20 1050  TempSrc: Oral  PainSc:                  [name]

## 2020-08-29 NOTE — Care Management Important Message (Signed)
Important Message  Patient Details  Name: Ellis Mehaffey MRN: [id number] Date of Birth: 03-01-1941   Medicare Important Message Given:  Yes     Shelda Altes 08/29/2020, 9:14 AM

## 2020-08-30 DIAGNOSIS — I480 Paroxysmal atrial fibrillation: Principal | ICD-10-CM

## 2020-08-30 LAB — BASIC METABOLIC PANEL
Anion gap: 8 (ref 5–15)
BUN: 19 mg/dL (ref 8–23)
CO2: 26 mmol/L (ref 22–32)
Calcium: 8.8 mg/dL — ABNORMAL LOW (ref 8.9–10.3)
Chloride: 102 mmol/L (ref 98–111)
Creatinine, Ser: 0.95 mg/dL (ref 0.61–1.24)
GFR, Estimated: >60 mL/min (ref >60)
Glucose, Bld: 85 mg/dL (ref 70–99)
Potassium: 4.5 mmol/L (ref 3.5–5.1)
Sodium: 136 mmol/L (ref 135–145)

## 2020-08-30 LAB — MAGNESIUM: Magnesium: 2.2 mg/dL (ref 1.7–2.4)

## 2020-08-30 MED ORDER — HYDROCORTISONE 1 % EX CREA: 1.0000 "application " | TOPICAL_CREAM | Freq: Three times a day (TID) | CUTANEOUS | 0 refills | Status: DC | PRN

## 2020-08-30 MED ORDER — NITROGLYCERIN 0.4 MG SL SUBL
0.4000 mg | SUBLINGUAL_TABLET | SUBLINGUAL | 4 refills | Status: DC | PRN
Start: 2020-08-30 — End: 2020-09-16

## 2020-08-30 MED ORDER — RIVAROXABAN 20 MG PO TABS: 20.0000 mg | ORAL_TABLET | Freq: Every day | ORAL | 6 refills | Status: DC

## 2020-08-30 NOTE — Progress Notes (Signed)
No discharge today, will monitor for another day. Discussed with Dr. Lovena Le.

## 2020-08-30 NOTE — Discharge Summary (Addendum)
Discharge Summary    Patient ID: Jerry Barr MRN: [id number]; DOB: 10/04/1940  Admit date: 08/25/2020 Discharge date: 08/31/2020  PCP:  Antony Contras, MD   [place]  Cardiologist:  Peter Martinique, MD  Advanced Practice Provider:  No care team member to display Electrophysiologist:  Virl Axe, MD        Discharge Diagnoses    Active Problems:   Atrial fibrillation with RVR [facility])   Atrial fibrillation [facility])    Diagnostic Studies/Procedures    DCCV 08/29/20  Procedure Details:   Consent: Risks of procedure as well as the alternatives and risks of each were explained to the (patient/caregiver).  Consent for procedure obtained.   Time Out: Verified patient identification, verified procedure, site/side was marked, verified correct patient position, special equipment/implants available, medications/allergies/relevent history reviewed, required imaging and test results available.  Performed   Patient placed on cardiac monitor, pulse oximetry, supplemental oxygen as necessary.  Sedation given: propofol 60 mg IV (Dr. Smith Robert) Pacer pads placed anterior and posterior chest.   Cardioverted 1 time(s).  Cardioversion with synchronized biphasic 120J shock.   Evaluation: Findings: Post procedure EKG shows: NSR Complications: None Patient did tolerate procedure well.    08/27/20 TEE    Consent: Obtained Time Out: Verified patient identification, verified procedure, site/side was marked, verified correct patient position, special equipment/implants available, Radiology Safety Procedures followed,  medications/allergies/relevent history reviewed, required imaging and test results available.  Performed   Medications: Propofol: 100mg  Lidocaine: 60mg    Left Ventrical:  LVEF 55-60%   Mitral Valve: Mild MR   Aortic Valve: Tricuspid. No AI   Tricuspid Valve: Normal structure. Mild TR.   Pulmonic Valve: Normal structure. No PI   Left Atrium/ Left  atrial appendage: There is smoke in the LA consistent with a low flow state. No evidence of LAA thrombus.   Atrial septum: No PFO by color doppler or agitated saline contrast.   Aorta: Mild plaquing     Complications: No apparent complications Patient did tolerate procedure well.   _____________   History of Present Illness     Jerry Barr is a 80 y.o. male with a hx of HTN, HLD, CAD (DES in 2011, Most recent cath in 06/2019 showed patent stents with non-obstructive CAD), AFib, orthostatic hypotension (uses midodrine in AM's, support wear) who was admitted 08/25/20 for atrial fib RVR.   He is followed by Dr. Caryl Comes last seen 06/2020 with little HR response,  concerning for more systemic autonomic insufficiency that improved with stopping amiodarone, there is mention of orthostatic symptoms much better.  Though summarized: "Notably today on orthostatic vital signs, significant heart rate increase was noted. This makes the issue of systemic dysautonomia less likely. Low voltage raises the possibility of amyloid. Will review with Dr. Shirlee More"  He was seen in A fib clinic 08/18/20 and in atrial fib and had DCCV 08/21/20 which was successful.   Unfortunately back in atrial fib and presented to ER 08/25/20 with weakness, racing palpitations and low SBP at home, despite proamatine.   He denied chest pain and syncope.   His xarelto has been underdosed so TEE was planned prior to beginning Tikosyn and xarelto was increased to 20 mg daily.  His ProAmatine was continued for orthostatic hypotension and autonomic dysfunction. . Pt admitted for further management of symptomatic atrial fib.   Hospital Course     Consultants: EP with Dr. Reymundo Poll was started 08/27/20 after negative TEE for thrombus, EKG with Qtc of  420 ms. K+ 4.0 and Mg+ 2.2.  Pt did well on tikosyn and plan for DCCV on the 18th.   By the 18th a fib 90-110 with occ to 130s.  Qtc467 ms Pt was cardioverted without complications.   Today pt  is doing well.  EKG on the 19th was stable pt kept overnight and discharged today 08/31/20 in SR    K+ 4.5, Mg+ 2.2  Pt has been seen and evaluated by Dr. Lovena Le and found stable for discharge.  He will be followed up in a fib clinic on 09/05/20 and labs will be checked at that time.    Did the patient have an acute coronary syndrome (MI, NSTEMI, STEMI, etc) this admission?:  No                               Did the patient have a percutaneous coronary intervention (stent / angioplasty)?:  No.       _____________  Discharge Vitals Blood pressure 110/74, pulse 69, temperature 98.4 F (36.9 C), temperature source Oral, resp. rate 16, height 6' (1.829 m), weight 67.4 kg, SpO2 98 %.  Filed Weights   08/29/20 0322 08/30/20 0603 08/31/20 0441  Weight: 67.8 kg 67.9 kg 67.4 kg    Labs & Radiologic Studies    CBC No results for input(s): WBC, NEUTROABS, HGB, HCT, MCV, PLT in the last 72 hours. Basic Metabolic Panel Recent Labs    08/29/20 0135 08/30/20 0306  NA 133* 136  K 3.9 4.5  CL 100 102  CO2 24 26  GLUCOSE 86 85  BUN 23 19  CREATININE 1.07 0.95  CALCIUM 9.0 8.8*  MG 2.3 2.2   Liver Function Tests No results for input(s): AST, ALT, ALKPHOS, BILITOT, PROT, ALBUMIN in the last 72 hours. No results for input(s): LIPASE, AMYLASE in the last 72 hours. High Sensitivity Troponin:   No results for input(s): TROPONINIHS in the last 720 hours.  BNP Invalid input(s): POCBNP D-Dimer No results for input(s): DDIMER in the last 72 hours. Hemoglobin A1C No results for input(s): HGBA1C in the last 72 hours. Fasting Lipid Panel No results for input(s): CHOL, HDL, LDLCALC, TRIG, CHOLHDL, LDLDIRECT in the last 72 hours. Thyroid Function Tests No results for input(s): TSH, T4TOTAL, T3FREE, THYROIDAB in the last 72 hours.  Invalid input(s): FREET3 _____________  ECHO TEE  Result Date: 08/27/2020    TRANSESOPHOGEAL ECHO REPORT   Patient Name:   Jerry Barr Date of Exam: 08/27/2020  Medical Rec #:  [id number]      Height:       72.0 in Accession #:    [id number]     Weight:       152.1 lb Date of Birth:  [birth year]      BSA:          1.897 m Patient Age:    80 years       BP:           132/87 mmHg Patient Gender: M              HR:           78 bpm. Exam Location:  Inpatient Procedure: Transesophageal Echo and Saline Contrast Bubble Study Indications:     I48.91* Unspeicified atrial fibrillation  History:         Patient has prior history of Echocardiogram examinations, most  recent 12/14/2019. CAD, Arrythmias:Atrial Fibrillation; Risk                  Factors:Dyslipidemia.  Sonographer:     Bernadene Person RDCS Referring Phys:  [id number] RENEE LYNN URSUY Diagnosing Phys: Gwyndolyn Kaufman MD PROCEDURE: After discussion of the risks and benefits of a TEE, an informed consent was obtained from the patient. The transesophogeal probe was passed without difficulty through the esophogus of the patient. Sedation performed by different physician. The patient was monitored while under deep sedation. Anesthestetic sedation was provided intravenously by Anesthesiology: 106.93mg  of Propofol, 60mg  of Lidocaine. The patient's vital signs; including heart rate, blood pressure, and oxygen saturation; remained stable throughout the procedure. The patient developed no complications during the procedure. IMPRESSIONS  1. Left ventricular ejection fraction, by estimation, is 55 to 60%. The left ventricle has normal function.  2. Right ventricular systolic function is normal. The right ventricular size is normal. There is normal pulmonary artery systolic pressure.  3. Left atrial size was moderately dilated. No left atrial/left atrial appendage thrombus was detected.  4. Right atrial size was mildly dilated.  5. The mitral valve is grossly normal. Mild mitral valve regurgitation.  6. The aortic valve is tricuspid. There is mild thickening of the aortic valve. Aortic valve regurgitation is not visualized.  Mild aortic valve sclerosis is present, with no evidence of aortic valve stenosis.  7. Agitated saline contrast bubble study was negative, with no evidence of any interatrial shunt. FINDINGS  Left Ventricle: Left ventricular ejection fraction, by estimation, is 55 to 60%. The left ventricle has normal function. The left ventricular internal cavity size was normal in size. There is no left ventricular hypertrophy. Right Ventricle: The right ventricular size is normal. No increase in right ventricular wall thickness. Right ventricular systolic function is normal. There is normal pulmonary artery systolic pressure. Left Atrium: Left atrial size was moderately dilated. Spontaneous echo contrast was present in the left atrium. No left atrial/left atrial appendage thrombus was detected. Right Atrium: Right atrial size was mildly dilated. Pericardium: There is no evidence of pericardial effusion. Mitral Valve: The mitral valve is grossly normal. There is mild thickening of the mitral valve leaflet(s). There is mild calcification of the mitral valve leaflet(s). Mild mitral annular calcification. Mild mitral valve regurgitation. Tricuspid Valve: The tricuspid valve is normal in structure. Tricuspid valve regurgitation is mild. Aortic Valve: The aortic valve is tricuspid. There is mild thickening of the aortic valve. Aortic valve regurgitation is not visualized. Mild aortic valve sclerosis is present, with no evidence of aortic valve stenosis. Pulmonic Valve: The pulmonic valve was normal in structure. Pulmonic valve regurgitation is trivial. Aorta: The aortic root is normal in size and structure. IAS/Shunts: There is right bowing of the interatrial septum, suggestive of elevated left atrial pressure. No atrial level shunt detected by color flow Doppler. Agitated saline contrast was given intravenously to evaluate for intracardiac shunting. Agitated saline contrast bubble study was negative, with no evidence of any  interatrial shunt.  TRICUSPID VALVE TR Peak grad:   14.4 mmHg TR Vmax:        190.00 cm/s Gwyndolyn Kaufman MD Electronically signed by Gwyndolyn Kaufman MD Signature Date/Time: 08/27/2020/3:00:23 PM    Final    Disposition   Pt is being discharged home today in good condition.  Follow-up Plans & Appointments     Follow-up Information     [facility] Follow up.   Specialty: Cardiology Why: 09/05/20 @ 10:30AM with  Marcene Brawn, PA Contact information: [address] [id number] Danice Goltz [place] [phone]        Shirley Friar, PA-C Follow up.   Specialty: Physician Assistant Why: 09/25/20 @ 11:20AM Contact information: [address] [place] [address] [phone]                   Discharge Medications   Allergies as of 08/31/2020       Reactions   Penicillins Rash   Did it involve swelling of the face/tongue/throat, SOB, or low BP? No Did it involve sudden or severe rash/hives, skin peeling, or any reaction on the inside of your mouth or nose? No Did you need to seek medical attention at a hospital or doctor's office? No When did it last happen?      50+ years  If all above answers are NO, may proceed with cephalosporin use.        Medication List     STOP taking these medications    famotidine 20 MG tablet Commonly known as: PEPCID       TAKE these medications    acetaminophen 500 MG tablet Commonly known as: TYLENOL Take 500 mg by mouth every 6 (six) hours as needed for mild pain (or headaches).   atorvastatin 20 MG tablet Commonly known as: LIPITOR TAKE 1 TABLET BY MOUTH EVERY DAY   BENEFIBER PO Take by mouth See admin instructions. Mix 2 teaspoonsful of powder into 6 ounces of water and drink once a day as needed for mild constipation or to add fiber to the diet   dofetilide 500 MCG capsule Commonly known as: TIKOSYN Take 1 capsule (500 mcg total) by mouth 2  (two) times daily.   fluticasone 50 MCG/ACT nasal spray Commonly known as: FLONASE Place 1 spray into both nostrils daily as needed (seasonal allergies).   hydrocortisone cream 1 % Apply 1 application topically 3 (three) times daily as needed for itching (skin irritation).   loratadine 10 MG tablet Commonly known as: CLARITIN Take 10 mg by mouth daily.   melatonin 3 MG Tabs tablet Take 6 mg by mouth at bedtime.   midodrine 5 MG tablet Commonly known as: PROAMATINE Take 5 mg by mouth See admin instructions. Take 5 mg by mouth in the morning and at lunchtime   nitroGLYCERIN 0.4 MG SL tablet Commonly known as: NITROSTAT Place 1 tablet (0.4 mg total) under the tongue every 5 (five) minutes x 3 doses as needed for chest pain.   polyethylene glycol 17 g packet Commonly known as: MIRALAX / GLYCOLAX Take 17 g by mouth daily as needed for mild constipation (MIX INTO 8 OUNCES OF WATER AND DRINK).   rivaroxaban 20 MG Tabs tablet Commonly known as: XARELTO Take 1 tablet (20 mg total) by mouth daily with breakfast. What changed:  medication strength how much to take when to take this   Vitamin D-3 25 MCG (1000 UT) Caps Take 1,000 Units by mouth daily.           Outstanding Labs/Studies   BMP and Mg+ on OV  Duration of Discharge Encounter   Greater than 30 minutes including physician time.  Signed, Cecilie Kicks, NP 08/31/2020, 8:18 AM  EP Attending  Patient seen and examined. See my progress note as well. His QTC is stable and no arrhythmias. He will be discharged home with usual followup in the atrial fib clinic next week.  Carleene Overlie Bijan Ridgley,MD

## 2020-08-30 NOTE — Plan of Care (Signed)

## 2020-08-30 NOTE — Progress Notes (Signed)
Progress Note  Patient Name: Floyed Masoud Date of Encounter: 08/30/2020  Primary Cardiologist: Peter Martinique, MD   Subjective   Denies chest pain or sob or palpitations  Inpatient Medications    Scheduled Meds: . atorvastatin  20 mg Oral Daily  . cholecalciferol  1,000 Units Oral Daily  . dofetilide  500 mcg Oral BID  . loratadine  10 mg Oral Daily  . melatonin  6 mg Oral QPM  . midodrine  5 mg Oral Daily  . Rivaroxaban  20 mg Oral Q breakfast  . sodium chloride flush  3 mL Intravenous Q12H   Continuous Infusions: . sodium chloride     PRN Meds: sodium chloride, acetaminophen, hydrocortisone cream, nitroGLYCERIN, polyethylene glycol, sodium chloride flush   Vital Signs    Vitals:   08/29/20 2329 08/30/20 0603 08/30/20 0728 08/30/20 0857  BP: 100/66 102/68 117/80 113/72  Pulse: 67 63 76 76  Resp:   18 18  Temp:  98.2 F (36.8 C)  98.2 F (36.8 C)  TempSrc:  Oral    SpO2:  99% 99% 99%  Weight:  67.9 kg    Height:        Intake/Output Summary (Last 24 hours) at 08/30/2020 0934 Last data filed at 08/30/2020 9628 Gross per 24 hour  Intake 290 ml  Output 900 ml  Net -610 ml   Filed Weights   08/28/20 0516 08/29/20 0322 08/30/20 0603  Weight: 68.4 kg 67.8 kg 67.9 kg    Telemetry    nsr - Personally Reviewed  ECG    nsr - Personally Reviewed  Physical Exam   GEN: No acute distress.   Neck: No JVD Cardiac: RRR, no murmurs, rubs, or gallops.  Respiratory: Clear to auscultation bilaterally. GI: Soft, nontender, non-distended  MS: No edema; No deformity. Neuro:  Nonfocal  Psych: Normal affect   Labs    Chemistry Recent Labs  Lab 08/25/20 1100 08/26/20 0435 08/28/20 0229 08/29/20 0135 08/30/20 0306  NA 141   < > 136 133* 136  K 4.2   < > 4.0 3.9 4.5  CL 107   < > 102 100 102  CO2 26   < > 25 24 26   GLUCOSE 88   < > 90 86 85  BUN 20   < > 19 23 19   CREATININE 0.93   < > 1.03 1.07 0.95  CALCIUM 8.9   < > 8.9 9.0 8.8*  PROT 6.6  --   --    --   --   ALBUMIN 3.6  --   --   --   --   AST 17  --   --   --   --   ALT 14  --   --   --   --   ALKPHOS 57  --   --   --   --   BILITOT 0.8  --   --   --   --   GFRNONAA >60   < > >60 >60 >60  ANIONGAP 8   < > 9 9 8    < > = values in this interval not displayed.     Hematology Recent Labs  Lab 08/25/20 1100  WBC 7.6  RBC 5.27  HGB 15.9  HCT 49.0  MCV 93.0  MCH 30.2  MCHC 32.4  RDW 13.7  PLT 210    Cardiac EnzymesNo results for input(s): TROPONINI in the last 168 hours. No results for input(s): TROPIPOC in the  last 168 hours.   BNPNo results for input(s): BNP, PROBNP in the last 168 hours.   DDimer No results for input(s): DDIMER in the last 168 hours.   Radiology    No results found.  Cardiac Studies   none  Patient Profile     80 y.o. male admitted with atial fib and started on dofetilide  Assessment & Plan    1. PAF - he is maintaining NSR on dofetilide. His QT has been ok. Await morning ECG. If QTC is acceptable, he can be discharged home with usual followup in atrial fib clinic and with Dr. Renaldo Reel in a couple of weeks.   For questions or updates, please contact [place] Please consult [email or web address] for contact info under Cardiology/STEMI.      Signed, Cristopher Peru, MD  08/30/2020, 9:34 AM  Patient ID: [name], male   DOB: [birth year], 80 y.o.   MRN: [id number]

## 2020-08-31 ENCOUNTER — Encounter (HOSPITAL_COMMUNITY): Payer: Self-pay | Admitting: Cardiology

## 2020-08-31 MED ORDER — DOFETILIDE 500 MCG PO CAPS: 500.0000 ug | ORAL_CAPSULE | Freq: Two times a day (BID) | ORAL | 0 refills | Status: DC

## 2020-08-31 MED ORDER — RIVAROXABAN 20 MG PO TABS: 20.0000 mg | ORAL_TABLET | Freq: Every day | ORAL | 6 refills | Status: DC

## 2020-08-31 NOTE — Plan of Care (Signed)
Problem: Education: Goal: Knowledge of General Education information will improve Description: Including pain rating scale, medication(s)/side effects and non-pharmacologic comfort measures 08/31/2020 0930 by Camillia Herter, RN Outcome: Adequate for Discharge 08/31/2020 0759 by Camillia Herter, RN Outcome: Progressing   Problem: Health Behavior/Discharge Planning: Goal: Ability to manage health-related needs will improve 08/31/2020 0930 by Camillia Herter, RN Outcome: Adequate for Discharge 08/31/2020 0759 by Camillia Herter, RN Outcome: Progressing   Problem: Clinical Measurements: Goal: Ability to maintain clinical measurements within normal limits will improve 08/31/2020 0930 by Camillia Herter, RN Outcome: Adequate for Discharge 08/31/2020 0759 by Camillia Herter, RN Outcome: Progressing Goal: Will remain free from infection 08/31/2020 0930 by Camillia Herter, RN Outcome: Adequate for Discharge 08/31/2020 0759 by Camillia Herter, RN Outcome: Progressing Goal: Diagnostic test results will improve 08/31/2020 0930 by Camillia Herter, RN Outcome: Adequate for Discharge 08/31/2020 0759 by Camillia Herter, RN Outcome: Progressing Goal: Respiratory complications will improve 08/31/2020 0930 by Camillia Herter, RN Outcome: Adequate for Discharge 08/31/2020 0759 by Camillia Herter, RN Outcome: Progressing Goal: Cardiovascular complication will be avoided 08/31/2020 0930 by Camillia Herter, RN Outcome: Adequate for Discharge 08/31/2020 0759 by Camillia Herter, RN Outcome: Progressing   Problem: Activity: Goal: Risk for activity intolerance will decrease 08/31/2020 0930 by Camillia Herter, RN Outcome: Adequate for Discharge 08/31/2020 0759 by Camillia Herter, RN Outcome: Progressing   Problem: Nutrition: Goal: Adequate nutrition will be maintained 08/31/2020 0930 by Camillia Herter, RN Outcome: Adequate for Discharge 08/31/2020 0759 by Camillia Herter, RN Outcome: Progressing   Problem:  Coping: Goal: Level of anxiety will decrease 08/31/2020 0930 by Camillia Herter, RN Outcome: Adequate for Discharge 08/31/2020 0759 by Camillia Herter, RN Outcome: Progressing   Problem: Elimination: Goal: Will not experience complications related to bowel motility 08/31/2020 0930 by Camillia Herter, RN Outcome: Adequate for Discharge 08/31/2020 0759 by Camillia Herter, RN Outcome: Progressing Goal: Will not experience complications related to urinary retention 08/31/2020 0930 by Camillia Herter, RN Outcome: Adequate for Discharge 08/31/2020 0759 by Camillia Herter, RN Outcome: Progressing   Problem: Pain Managment: Goal: General experience of comfort will improve 08/31/2020 0930 by Camillia Herter, RN Outcome: Adequate for Discharge 08/31/2020 0759 by Camillia Herter, RN Outcome: Progressing   Problem: Safety: Goal: Ability to remain free from injury will improve 08/31/2020 0930 by Camillia Herter, RN Outcome: Adequate for Discharge 08/31/2020 0759 by Camillia Herter, RN Outcome: Progressing   Problem: Skin Integrity: Goal: Risk for impaired skin integrity will decrease 08/31/2020 0930 by Camillia Herter, RN Outcome: Adequate for Discharge 08/31/2020 0759 by Camillia Herter, RN Outcome: Progressing   Problem: Education: Goal: Knowledge of disease or condition will improve 08/31/2020 0930 by Camillia Herter, RN Outcome: Adequate for Discharge 08/31/2020 0759 by Camillia Herter, RN Outcome: Progressing Goal: Understanding of medication regimen will improve 08/31/2020 0930 by Camillia Herter, RN Outcome: Adequate for Discharge 08/31/2020 0759 by Camillia Herter, RN Outcome: Progressing Goal: Individualized Educational Video(s) 08/31/2020 0930 by Camillia Herter, RN Outcome: Adequate for Discharge 08/31/2020 0759 by Camillia Herter, RN Outcome: Progressing   Problem: Activity: Goal: Ability to tolerate increased activity will improve 08/31/2020 0930 by Camillia Herter, RN Outcome: Adequate for  Discharge 08/31/2020 0759 by Camillia Herter, RN Outcome: Progressing   Problem: Cardiac: Goal: Ability to achieve and maintain adequate cardiopulmonary perfusion will improve 08/31/2020 0930 by  Camillia Herter, RN Outcome: Adequate for Discharge 08/31/2020 3795 by Camillia Herter, RN Outcome: Progressing   Problem: Health Behavior/Discharge Planning: Goal: Ability to safely manage health-related needs after discharge will improve 08/31/2020 0930 by Camillia Herter, RN Outcome: Adequate for Discharge 08/31/2020 0759 by Camillia Herter, RN Outcome: Progressing

## 2020-08-31 NOTE — Plan of Care (Signed)

## 2020-08-31 NOTE — Progress Notes (Signed)
Progress Note  Patient Name: Emannuel Vise Date of Encounter: 08/31/2020  Primary Cardiologist: Peter Martinique, MD   Subjective   No chest pain or sob  Inpatient Medications    Scheduled Meds: . atorvastatin  20 mg Oral Daily  . cholecalciferol  1,000 Units Oral Daily  . dofetilide  500 mcg Oral BID  . loratadine  10 mg Oral Daily  . melatonin  6 mg Oral QPM  . midodrine  5 mg Oral Daily  . Rivaroxaban  20 mg Oral Q breakfast  . sodium chloride flush  3 mL Intravenous Q12H   Continuous Infusions: . sodium chloride     PRN Meds: sodium chloride, acetaminophen, hydrocortisone cream, nitroGLYCERIN, polyethylene glycol, sodium chloride flush   Vital Signs    Vitals:   08/30/20 1641 08/30/20 1935 08/31/20 0441 08/31/20 0753  BP: 125/72 105/71 91/63 110/74  Pulse: 74  65 69  Resp: 16 15  16   Temp: 98 F (36.7 C) 98.2 F (36.8 C) 98 F (36.7 C) 98.4 F (36.9 C)  TempSrc: Oral Oral Oral Oral  SpO2: 99% 99% 98% 98%  Weight:   67.4 kg   Height:        Intake/Output Summary (Last 24 hours) at 08/31/2020 0756 Last data filed at 08/31/2020 0728 Gross per 24 hour  Intake -  Output 1350 ml  Net -1350 ml   Filed Weights   08/29/20 0322 08/30/20 0603 08/31/20 0441  Weight: 67.8 kg 67.9 kg 67.4 kg    Telemetry    nsr - Personally Reviewed  ECG    Nsr, QTC 460 - Personally Reviewed  Physical Exam   GEN: No acute distress.   Neck: No JVD Cardiac: RRR Respiratory: no increased work of breathing GI:  non-distended  MS: No edema; No deformity. Neuro:  Nonfocal  Psych: Normal affect   Labs    Chemistry Recent Labs  Lab 08/25/20 1100 08/26/20 0435 08/28/20 0229 08/29/20 0135 08/30/20 0306  NA 141   < > 136 133* 136  K 4.2   < > 4.0 3.9 4.5  CL 107   < > 102 100 102  CO2 26   < > 25 24 26   GLUCOSE 88   < > 90 86 85  BUN 20   < > 19 23 19   CREATININE 0.93   < > 1.03 1.07 0.95  CALCIUM 8.9   < > 8.9 9.0 8.8*  PROT 6.6  --   --   --   --   ALBUMIN  3.6  --   --   --   --   AST 17  --   --   --   --   ALT 14  --   --   --   --   ALKPHOS 57  --   --   --   --   BILITOT 0.8  --   --   --   --   GFRNONAA >60   < > >60 >60 >60  ANIONGAP 8   < > 9 9 8    < > = values in this interval not displayed.     Hematology Recent Labs  Lab 08/25/20 1100  WBC 7.6  RBC 5.27  HGB 15.9  HCT 49.0  MCV 93.0  MCH 30.2  MCHC 32.4  RDW 13.7  PLT 210    Cardiac EnzymesNo results for input(s): TROPONINI in the last 168 hours. No results for input(s): TROPIPOC in the last  168 hours.   BNPNo results for input(s): BNP, PROBNP in the last 168 hours.   DDimer No results for input(s): DDIMER in the last 168 hours.   Radiology    No results found.  Cardiac Studies   none  Patient Profile     80 y.o. male admitted with atrial fib for dofetilide  Assessment & Plan    1. PAF - he is maintaining NSR very nicely on dofetilide. His QT is acceptable from last night. He is stable for DC home this morning.  Followup with atrial fib clinic as per protocol and with Dr. Renaldo Reel in the next few weeks.  For questions or updates, please contact [place] Please consult [email or web address] for contact info under Cardiology/STEMI.      Signed, Cristopher Peru, MD  08/31/2020, 7:56 AM  Patient ID: [name], male   DOB: [birth year], 80 y.o.   MRN: [id number]

## 2020-08-31 NOTE — Care Management (Signed)
Tubed xaralto coupon card to unit, nurse notified via secure chat.  Spoke w patient, he is aware that he should be given a paper Rx for xaralto and a coupon at discharge. He understand direction for use for coupon.

## 2020-09-05 ENCOUNTER — Other Ambulatory Visit: Payer: Self-pay

## 2020-09-05 ENCOUNTER — Ambulatory Visit (HOSPITAL_COMMUNITY)
Admit: 2020-09-05 | Discharge: 2020-09-05 | Disposition: A | Discharge status: Home / Self Care | Payer: Medicare Other | Source: Ambulatory Visit | Attending: Physician Assistant | Admitting: Physician Assistant

## 2020-09-05 ENCOUNTER — Encounter (HOSPITAL_COMMUNITY): Payer: Self-pay | Admitting: Physician Assistant

## 2020-09-05 VITALS — BP 116/74 | HR 73 | Ht 72.0 in | Wt 155.6 lb

## 2020-09-05 DIAGNOSIS — Z8249 Family history of ischemic heart disease and other diseases of the circulatory system: Secondary | ICD-10-CM | POA: Insufficient documentation

## 2020-09-05 DIAGNOSIS — I2510 Atherosclerotic heart disease of native coronary artery without angina pectoris: Secondary | ICD-10-CM | POA: Insufficient documentation

## 2020-09-05 DIAGNOSIS — D6869 Other thrombophilia: Secondary | ICD-10-CM

## 2020-09-05 DIAGNOSIS — Z880 Allergy status to penicillin: Secondary | ICD-10-CM | POA: Diagnosis not present

## 2020-09-05 DIAGNOSIS — Z79899 Other long term (current) drug therapy: Secondary | ICD-10-CM | POA: Diagnosis not present

## 2020-09-05 DIAGNOSIS — I951 Orthostatic hypotension: Secondary | ICD-10-CM | POA: Insufficient documentation

## 2020-09-05 DIAGNOSIS — I4819 Other persistent atrial fibrillation: Secondary | ICD-10-CM | POA: Diagnosis not present

## 2020-09-05 DIAGNOSIS — Z7901 Long term (current) use of anticoagulants: Secondary | ICD-10-CM | POA: Diagnosis not present

## 2020-09-05 LAB — BASIC METABOLIC PANEL
Anion gap: 9 (ref 5–15)
BUN: 21 mg/dL (ref 8–23)
CO2: 24 mmol/L (ref 22–32)
Calcium: 9.1 mg/dL (ref 8.9–10.3)
Chloride: 105 mmol/L (ref 98–111)
Creatinine, Ser: 1.05 mg/dL (ref 0.61–1.24)
GFR, Estimated: >60 mL/min (ref >60)
Glucose, Bld: 87 mg/dL (ref 70–99)
Potassium: 5.4 mmol/L — ABNORMAL HIGH (ref 3.5–5.1)
Sodium: 138 mmol/L (ref 135–145)

## 2020-09-05 LAB — MAGNESIUM: Magnesium: 2.5 mg/dL — ABNORMAL HIGH (ref 1.7–2.4)

## 2020-09-05 NOTE — Progress Notes (Signed)
Primary Care Physician: Antony Contras, MD Primary Cardiologist: Dr Martinique Primary Electrophysiologist: Dr Caryl Comes Referring Physician: [facility]   Jerry Barr is a 80 y.o. male with a history of paroxysmal atrial fibrillation, CAD, BPH, HLD, and chronic hypotension who presents for follow up in the [facility]. He is on Xarelto for a CHADS2VASC score of 3. Patient seen by Dr Martinique 06/20/19 with symptoms of exertional neck and shoulder pain similar to his previous anginal symptoms. LHC 06/25/19 showed nonobstructive disease with patent stents. His amiodarone was discontinued 2/2 side effects with autonomic dysfunction. He was in his usual health until 08/15/20 when he started having symptoms of fatigue and intermittent elevated heart rates.   On follow up today, he underwent DCCV on 08/21/20 but unfortunately had early return of his afib and went to the ED on 08/25/20. He was loaded on dofetilide at that time and underwent repeat DCCV on 08/29/20. He reports that he feels much improved. He denies any medication side effects.   Today, he denies symptoms of palpitations, chest pain, shortness of breath, orthopnea, PND, lower extremity edema, syncope, snoring, daytime somnolence, bleeding, or neurologic sequela. The patient is tolerating medications without difficulties and is otherwise without complaint today.    Atrial Fibrillation Risk Factors:  he does not have symptoms or diagnosis of sleep apnea. he does not have a history of alcohol use. The patient does not have a history of early familial atrial fibrillation or other arrhythmias.  he has a BMI of Body mass index is 21.1 kg/m.Marland Kitchen Filed Weights   09/05/20 1034  Weight: 70.6 kg    Family History  Problem Relation Age of Onset  . Breast cancer Mother   . Colon cancer Mother   . CAD Father   . Heart attack Paternal Grandmother   . Colon cancer Brother   . Esophageal cancer Brother   . Liver cancer  Brother   . Cancer Brother   . Leukemia Paternal Uncle   . Bone cancer Paternal Grandfather   . Pancreatic cancer Neg Hx   . Prostate cancer Neg Hx   . Rectal cancer Neg Hx   . Stomach cancer Neg Hx      Atrial Fibrillation Management history:  Previous antiarrhythmic drugs: amiodarone, dofetilide  Previous cardioversions: 2017, 10/15/18, 08/21/20, 08/29/20 Previous ablations: none CHADS2VASC score: 3 Anticoagulation history: Xarelto    Past Medical History:  Diagnosis Date  . Allergy   . Aortic atherosclerosis ([place])   . Atrial fibrillation ([place])   . BPH (benign prostatic hyperplasia)   . CAD (coronary artery disease)   . Cancer (HCC)    basal cell ca - face, nose and right leg  . Clotting disorder (HCC)    a fib hx - Xarelto  . Coronary artery disease    Promus drug-eluting stent to a 99% mid LAD, 40-50% narrowing in the distal LAD with mild to moderate disease in the circ and RCA b. 5/6 PCI/DESx1 to mLcx  . Dyslipidemia   . ED (erectile dysfunction)   . Hearing loss   . Hyperplastic colon polyp   . Hypoglycemia   . Orthostatic hypotension   . REM sleep behavior disorder    not tested   Past Surgical History:  Procedure Laterality Date  . BUBBLE STUDY  08/27/2020   Procedure: BUBBLE STUDY;  Surgeon: Freada Bergeron, MD;  Location: [place];  Service: Cardiovascular;;  . CARDIOVERSION N/A 04/23/2016   Procedure: CARDIOVERSION;  Surgeon: Elta Guadeloupe  Lurline Del, MD;  Location: [place];  Service: Cardiovascular;  Laterality: N/A;  . CARDIOVERSION N/A 08/21/2020   Procedure: CARDIOVERSION;  Surgeon: Dorothy Spark, MD;  Location: [facility] ENDOSCOPY;  Service: Cardiovascular;  Laterality: N/A;  . CARDIOVERSION N/A 08/29/2020   Procedure: CARDIOVERSION;  Surgeon: [name], MD;  Location: [facility] ENDOSCOPY;  Service: Cardiovascular;  Laterality: N/A;  . COLONOSCOPY    . CORONARY ANGIOPLASTY WITH STENT PLACEMENT    . CORONARY STENT INTERVENTION N/A 11/15/2018   Procedure:  CORONARY STENT INTERVENTION;  Surgeon: Burnell Blanks, MD;  Location: [place] CV LAB;  Service: Cardiovascular;  Laterality: N/A;  . LEFT HEART CATH AND CORONARY ANGIOGRAPHY N/A 11/15/2018   Procedure: LEFT HEART CATH AND CORONARY ANGIOGRAPHY;  Surgeon: Burnell Blanks, MD;  Location: [place] CV LAB;  Service: Cardiovascular;  Laterality: N/A;  . LEFT HEART CATH AND CORONARY ANGIOGRAPHY N/A 06/25/2019   Procedure: LEFT HEART CATH AND CORONARY ANGIOGRAPHY;  Surgeon: Martinique, Peter M, MD;  Location: [place] CV LAB;  Service: Cardiovascular;  Laterality: N/A;  . POLYPECTOMY    . TEE WITHOUT CARDIOVERSION N/A 04/23/2016   Procedure: TRANSESOPHAGEAL ECHOCARDIOGRAM (TEE);  Surgeon: [name], MD;  Location: [place];  Service: Cardiovascular;  Laterality: N/A;  . TEE WITHOUT CARDIOVERSION N/A 08/27/2020   Procedure: TRANSESOPHAGEAL ECHOCARDIOGRAM (TEE);  Surgeon: Freada Bergeron, MD;  Location[facility]COPY;  Service: Cardiovascular;  Laterality: N/A;  . UPPER GASTROINTESTINAL ENDOSCOPY      Current Outpatient Medications  Medication Sig Dispense Refill  . acetaminophen (TYLENOL) 500 MG tablet Take 500 mg by mouth every 6 (six) hours as needed for mild pain (or headaches).    Marland Kitchen atorvastatin (LIPITOR) 20 MG tablet TAKE 1 TABLET BY MOUTH EVERY DAY 90 tablet 3  . Cholecalciferol (VITAMIN D-3) 1000 UNITS CAPS Take 1,000 Units by mouth daily.    Marland Kitchen dofetilide (TIKOSYN) 500 MCG capsule Take 1 capsule (500 mcg total) by mouth 2 (two) times daily. 60 capsule 0  . fluticasone (FLONASE) 50 MCG/ACT nasal spray Place 1 spray into both nostrils daily as needed (seasonal allergies).     . hydrocortisone cream 1 % Apply 1 application topically 3 (three) times daily as needed for itching (skin irritation). 30 g 0  . loratadine (CLARITIN) 10 MG tablet Take 10 mg by mouth daily.    . Melatonin 3 MG TABS Take 6 mg by mouth at bedtime.    . midodrine (PROAMATINE) 5 MG tablet Take 5  mg by mouth See admin instructions. Take 5 mg by mouth in the morning and at lunchtime    . nitroGLYCERIN (NITROSTAT) 0.4 MG SL tablet Place 1 tablet (0.4 mg total) under the tongue every 5 (five) minutes x 3 doses as needed for chest pain. 25 tablet 4  . polyethylene glycol (MIRALAX / GLYCOLAX) 17 g packet Take 17 g by mouth daily as needed for mild constipation (MIX INTO 8 OUNCES OF WATER AND DRINK).    . rivaroxaban (XARELTO) 20 MG TABS tablet Take 1 tablet (20 mg total) by mouth daily with breakfast. 30 tablet 6  . Wheat Dextrin (BENEFIBER PO) Take by mouth See admin instructions. Mix 2 teaspoonsful of powder into 6 ounces of water and drink once a day as needed for mild constipation or to add fiber to the diet     No current facility-administered medications for this encounter.    Allergies  Allergen Reactions  . Penicillins Rash    Did it involve swelling  of the face/tongue/throat, SOB, or low BP? No Did it involve sudden or severe rash/hives, skin peeling, or any reaction on the inside of your mouth or nose? No Did you need to seek medical attention at a hospital or doctor's office? No When did it last happen?50+ years  If all above answers are "NO", may proceed with cephalosporin use.     Social History   Socioeconomic History  . Marital status: Married    Spouse name: Sheria Lang  . Number of children: Not on file  . Years of education: Not on file  . Highest education level: Professional school degree (e.g., MD, DDS, DVM, JD)  Occupational History    Comment: retired  CE  Tobacco Use  . Smoking status: Never Smoker  . Smokeless tobacco: Never Used  Vaping Use  . Vaping Use: Never used  Substance and Sexual Activity  . Alcohol use: Yes    Alcohol/week: 4.0 standard drinks    Types: 4 Glasses of wine per week    Comment: 3oz of red wine  . Drug use: No  . Sexual activity: Not Currently  Other Topics Concern  . Not on file  Social History Narrative   Lives with  wife   Almost no caffeine   Social Determinants of Health   Financial Resource Strain: Not on file  Food Insecurity: Not on file  Transportation Needs: Not on file  Physical Activity: Not on file  Stress: Not on file  Social Connections: Not on file  Intimate Partner Violence: Not on file     ROS- All systems are reviewed and negative except as per the HPI above.  Physical Exam: Vitals:   09/05/20 1034  BP: 116/74  Pulse: 73  Weight: 70.6 kg  Height: 6' (1.829 m)    GEN- The patient is well appearing elderly male, alert and oriented x 3 today.   HEENT-head normocephalic, atraumatic, sclera clear, conjunctiva pink, hearing intact, trachea midline. Lungs- Clear to ausculation bilaterally, normal work of breathing Heart- Regular rate and rhythm, no murmurs, rubs or gallops  GI- soft, NT, ND, + BS Extremities- no clubbing, cyanosis, or edema MS- no significant deformity or atrophy Skin- no rash or lesion Psych- euthymic mood, full affect Neuro- strength and sensation are intact   Wt Readings from Last 3 Encounters:  09/05/20 70.6 kg  08/31/20 67.4 kg  08/18/20 70.9 kg    EKG today demonstrates  SR Vent. rate 73 BPM PR interval 200 ms QRS duration 86 ms QT/QTc 408/449 ms  Echo 12/14/19 demonstrated  1. Left ventricular ejection fraction, by estimation, is 55 to 60%. The  left ventricle has normal function. The left ventricle has no regional  wall motion abnormalities. Left ventricular diastolic parameters were  normal. The average left ventricular  global longitudinal strain is -20.5 %. The global longitudinal strain is  normal.  2. Right ventricular systolic function is normal. The right ventricular  size is normal. There is normal pulmonary artery systolic pressure. The  estimated right ventricular systolic pressure is 27.0 mmHg.  3. The mitral valve is normal in structure. Mild mitral valve  regurgitation. No evidence of mitral stenosis.  4. The aortic  valve is normal in structure. Aortic valve regurgitation is  not visualized. No aortic stenosis is present.  5. The inferior vena cava is normal in size with greater than 50%  respiratory variability, suggesting right atrial pressure of 3 mmHg.   Epic records are reviewed at length today  Assessment and  Plan:  1. Persistent atrial fibrillation S/p dofetilide loading 2/15-2/19/22 with DCCV on 08/29/20 Patient appears to be maintaining SR.  Continue Xarelto 20 mg daily Continue dofetilide 500 mcg BID. QT stable. Check bmet/mag today.  This patients CHA2DS2-VASc Score and unadjusted Ischemic Stroke Rate (% per year) is equal to 3.2 % stroke rate/year from a score of 3  Above score calculated as 1 point each if present [CHF, HTN, DM, Vascular=MI/PAD/Aortic Plaque, Age if 65-74, or Male] Above score calculated as 2 points each if present [Age > 75, or Stroke/TIA/TE]  2. CAD S/p DES to LAD 2011 and mid cirfumflex 11/2018. Repeat LHC 06/25/19 showed nonobstructive disease and patent stents. No anginal symptoms.  3. Orthostatic Hypotension On midodrine PRN, followed by Dr Caryl Comes. BP stable today.   Follow up with Dr Martinique and Oda Kilts as scheduled.    [facility] [address] [place], [place] [address] [phone] 09/05/2020 10:58 AM

## 2020-09-08 NOTE — Progress Notes (Unsigned)
Date:  09/08/2020   ID:  Jerry Barr, DOB [birth year], MRN [id number]   PCP:  Antony Contras, MD  Cardiologist:  Peter Martinique, MD  Electrophysiologist:  Virl Axe, MD   Evaluation Performed:  Follow-Up Visit  Chief Complaint: shoulder pain.   History of Present Illness:    Jerry Barr is a 80 y.o. male seen for follow up post cardiac cath. He has a  PMH of HLD, chronic hypotension, PAF on Xarelto, CAD s/p DES to LAD 2011 in [place] and BPH. He was diagnosed with new afib in 04/2016. Echo in 04/2016 showed EF 50-55%, no RWMA, mild MR. He had successful TEE DCCV 04/23/2016. He was placed on midodrine for orthostatic hypotension. He is not on any rate control therapy for the same reason. Myoview  In 06/2017 showed EF 55%, no significant ischemia or scar. Echo in 06/2017 EF 45-50%, mild diffuse hypokinesis, PA peak pressure 67mmHg.  Patient was seen in the  ED in April  with recurrent afib and fatigue. Given his history of orthostatic hypotension, it was elected to proceed with cardioversion in the ED. This was performed by Dr. Haroldine Laws. Post cardioversion, his amiodarone was increased to 200mg  BID. After discharge, he was seen in the afib clinic with plans to consider decreasing amiodarone back to 200mg  daily after 1 month if no recurrent afib.   He was seen on 10/30/2018 for post hospital follow-up, at which time he noted  shoulder blade pain that occur with  walking. He had a  cardiac catheterization on 11/15/2018 which revealed 20% proximal RCA, 90% mid left circumflex lesion treated with a 2.25 x 16 mm Synergy DES, EF 55 to 65%.  Postprocedure, patient was discharged on aspirin, Plavix and Xarelto.  He stopped aspirin after 1 month.  He will require a minimum of 6 months of Plavix.   When seen in August he felt great.  He still had some days with orthostasis.  Wears support hose and uses midodrine in am.  He has been diagnosed with an REM sleep disorder managed by Dr Maxwell Caul. He was  seen in the Afib clinic on 05/16/19 and was maintaining NSR on 200 mg of amiodarone. More recently  he reported recurrent exertional neck and pain in his shoulder blade that resolves with rest. This may occur in the am with initial activity and also with his daily walk. Sometimes if he continues to walk it goes away after 15 minutes. He also notes increased heartburn in the past 2 weeks. These symptoms are similar to what he had in May and in 2011 with his initial stent. He underwent repeat Cardiac cath on 06/25/19 and this showed only nonobstructive disease with patent stents.   He has since been seen by Dr Caryl Comes for evaluation of his orthostatic hypotension- felt to be related to autonomic failure. Echo was OK. Dr Caryl Comes felt that amiodarone was making symptoms worse and this was discontinued. Event monitor showed AFib burden of 0.6% so AAD therapy was not needed. He was tried on low dose metoprolol but did not tolerate this due to fatigue and indigestion.   He was seen in February 2022 with recurrent and persistent AFib. Had DCCV but early return. He was admitted and started on Tikosyn. Had repeat DCCV on 08/29/20.   On follow up today he is doing well. He still has some orthostatic hypotension. Reports symptoms are mild. Takes midodrine daily and then PRN. Liberal salt intake. Compression hose. Prn abdominal binder. No afib noted.  Past Medical History:  Diagnosis Date  . Allergy   . Aortic atherosclerosis ([place])   . Atrial fibrillation ([place])   . BPH (benign prostatic hyperplasia)   . CAD (coronary artery disease)   . Cancer (H[facility])    basal cell ca - face, nose and right leg  . Clotting disorder (H[facility])    a fib hx - Xarelto  . Coronary artery disease    Promus drug-eluting stent to a 99% mid LAD, 40-50% narrowing in the distal LAD with mild to moderate disease in the circ and RCA b. 5/6 PCI/DESx1 to mLcx  . Dyslipidemia   . ED (erectile dysfunction)   . Hearing loss   . Hyperplastic colon  polyp   . Hypoglycemia   . Orthostatic hypotension   . REM sleep behavior disorder    not tested   Past Surgical History:  Procedure Laterality Date  . BUBBLE STUDY  08/27/2020   Procedure: BUBBLE STUDY;  Surgeon: Freada Bergeron, MD;  Location: [place];  Service: Cardiovascular;;  . CARDIOVERSION N/A 04/23/2016   Procedure: CARDIOVERSION;  Surgeon: [name], MD;  Location: [place];  Service: Cardiovascular;  Laterality: N/A;  . CARDIOVERSION N/A 08/21/2020   Procedure: CARDIOVERSION;  Surgeon: Dorothy Spark, MD;  Location: [facility] ENDOSCOPY;  Service: Cardiovascular;  Laterality: N/A;  . CARDIOVERSION N/A 08/29/2020   Procedure: CARDIOVERSION;  Surgeon: [name], MD;  Location: [facility] ENDOSCOPY;  Service: Cardiovascular;  Laterality: N/A;  . COLONOSCOPY    . CORONARY ANGIOPLASTY WITH STENT PLACEMENT    . CORONARY STENT INTERVENTION N/A 11/15/2018   Procedure: CORONARY STENT INTERVENTION;  Surgeon: Burnell Blanks, MD;  Location: [place] CV LAB;  Service: Cardiovascular;  Laterality: N/A;  . LEFT HEART CATH AND CORONARY ANGIOGRAPHY N/A 11/15/2018   Procedure: LEFT HEART CATH AND CORONARY ANGIOGRAPHY;  Surgeon: Burnell Blanks, MD;  Location: [place] CV LAB;  Service: Cardiovascular;  Laterality: N/A;  . LEFT HEART CATH AND CORONARY ANGIOGRAPHY N/A 06/25/2019   Procedure: LEFT HEART CATH AND CORONARY ANGIOGRAPHY;  Surgeon: Martinique, Peter M, MD;  Location: [place] CV LAB;  Service: Cardiovascular;  Laterality: N/A;  . POLYPECTOMY    . TEE WITHOUT CARDIOVERSION N/A 04/23/2016   Procedure: TRANSESOPHAGEAL ECHOCARDIOGRAM (TEE);  Surgeon: [name], MD;  Location: [place];  Service: Cardiovascular;  Laterality: N/A;  . TEE WITHOUT CARDIOVERSION N/A 08/27/2020   Procedure: TRANSESOPHAGEAL ECHOCARDIOGRAM (TEE);  Surgeon: Freada Bergeron, MD;  Location: [facility] END[facility]COPY;  Service: Cardiovascular;  Laterality: N/A;  . UPPER GASTROINTESTINAL  ENDOSCOPY       No outpatient medications have been marked as taking for the 09/11/20 encounter (Appointment) with Martinique, Peter M, MD.     Allergies:   Penicillins   Social History   Tobacco Use  . Smoking status: Never Smoker  . Smokeless tobacco: Never Used  Vaping Use  . Vaping Use: Never used  Substance Use Topics  . Alcohol use: Yes    Alcohol/week: 4.0 standard drinks    Types: 4 Glasses of wine per week    Comment: 3oz of red wine  . Drug use: No     Family Hx: The patient's family history includes Bone cancer in his paternal grandfather; Breast cancer in his mother; CAD in his father; Cancer in his brother; Colon cancer in his brother and mother; Esophageal cancer in his brother; Heart attack in his paternal grandmother; Leukemia in his paternal uncle; Liver cancer in his brother. There is no  history of Pancreatic cancer, Prostate cancer, Rectal cancer, or Stomach cancer.  ROS:   Please see the history of present illness.     All other systems reviewed and are negative.   Prior CV studies:   The following studies were reviewed today:  Echo 06/13/17: Study Conclusions  - Left ventricle: The cavity size was normal. Systolic function was   mildly reduced. The estimated ejection fraction was in the range   of 45% to 50%. Mild diffuse hypokinesis. Left ventricular   diastolic function parameters were normal. - Aortic valve: Transvalvular velocity was within the normal range.   There was no stenosis. There was trivial regurgitation. - Mitral valve: There was trivial regurgitation. - Left atrium: The atrium was moderately dilated. - Right ventricle: The cavity size was normal. Wall thickness was   normal. Systolic function was normal. - Right atrium: The atrium was severely dilated. - Atrial septum: No defect or patent foramen ovale was identified   by color flow Doppler. - Tricuspid valve: There was trivial regurgitation. - Pulmonary arteries: Systolic pressure was  within the normal   range. PA peak pressure: 24 mm Hg (S).  Cath 11/15/2018  Prox RCA lesion is 20% stenosed.  Mid Cx lesion is 90% stenosed.  Previously placed Prox LAD to Mid LAD stent (unknown type) is widely patent.  A drug-eluting stent was successfully placed using a STENT SYNERGY DES 2.25X16.  Post intervention, there is a 0% residual stenosis.  The left ventricular systolic function is normal.  LV end diastolic pressure is normal.  The left ventricular ejection fraction is 55-65% by visual estimate.  There is no mitral valve regurgitation.   1. Patent mid LAD stent without restenosis 2. Severe stenosis mid Circumflex artery.  3. Successful PTCA/DES x 1 mid Circumflex 4. Mild non-obstructive disease in the RCA 5. Normal LV systolic function  Recommendations: ASA and Plavix for one month then stop ASA. Will restart Xarelto tomorrow. Would recommend at least six month of Plavix and Xarelto but could consider stopping Plavix at 6 months if necessary. Likely same day PCI discharge.    Cardiac cath 06/25/19: Procedures  LEFT HEART CATH AND CORONARY ANGIOGRAPHY  Conclusion    Non-stenotic Prox LAD to Mid LAD lesion was previously treated.  Previously placed Mid Cx drug eluting stent is widely patent.  Balloon angioplasty was performed.  Prox RCA lesion is 20% stenosed.  The left ventricular systolic function is normal.  LV end diastolic pressure is normal.  The left ventricular ejection fraction is 55-65% by visual estimate.   1. Nonobstructive CAD- patent stents in LAD and LCx. 2. Normal LV function 3. Normal LVEDP  Plan: continue medical therapy.     Echo 12/14/19: IMPRESSIONS    1. Left ventricular ejection fraction, by estimation, is 55 to 60%. The  left ventricle has normal function. The left ventricle has no regional  wall motion abnormalities. Left ventricular diastolic parameters were  normal. The average left ventricular  global  longitudinal strain is -20.5 %. The global longitudinal strain is  normal.  2. Right ventricular systolic function is normal. The right ventricular  size is normal. There is normal pulmonary artery systolic pressure. The  estimated right ventricular systolic pressure is 10.9 mmHg.  3. The mitral valve is normal in structure. Mild mitral valve  regurgitation. No evidence of mitral stenosis.  4. The aortic valve is normal in structure. Aortic valve regurgitation is  not visualized. No aortic stenosis is present.  5. The inferior  vena cava is normal in size with greater than 50%  respiratory variability, suggesting right atrial pressure of 3 mmHg.    Event monitor 01/01/20: Study Highlights  Indication: palpitations  Duration: 30d  Findings HR  avg 67  Min 51-Max 127  PVCs <1% PACs about 1 % AFib noted < 1% Recommendations Will try low dose BB See note  TEE 08/27/20: IMPRESSIONS    1. Left ventricular ejection fraction, by estimation, is 55 to 60%. The  left ventricle has normal function.  2. Right ventricular systolic function is normal. The right ventricular  size is normal. There is normal pulmonary artery systolic pressure.  3. Left atrial size was moderately dilated. No left atrial/left atrial  appendage thrombus was detected.  4. Right atrial size was mildly dilated.  5. The mitral valve is grossly normal. Mild mitral valve regurgitation.  6. The aortic valve is tricuspid. There is mild thickening of the aortic  valve. Aortic valve regurgitation is not visualized. Mild aortic valve  sclerosis is present, with no evidence of aortic valve stenosis.  7. Agitated saline contrast bubble study was negative, with no evidence  of any interatrial shunt.   Labs/Other Tests and Data Reviewed:     Recent Labs: 08/25/2020: ALT 14; Hemoglobin 15.9; Platelets 210; TSH 2.124 09/05/2020: BUN 21; Creatinine, Ser 1.05; Magnesium 2.5; Potassium 5.4; Sodium 138   Recent  Lipid Panel Lab Results  Component Value Date/Time   CHOL 120 03/23/2019 08:38 AM   TRIG 42 03/23/2019 08:38 AM   HDL 50 03/23/2019 08:38 AM   CHOLHDL 2.4 03/23/2019 08:38 AM   CHOLHDL 3.0 06/29/2016 10:11 AM   LDLCALC 59 03/23/2019 08:38 AM   Dated 02/20/20: choleseterol 128, triglycerides 38, HDL 54, LDL 64.   Wt Readings from Last 3 Encounters:  09/05/20 155 lb 9.6 oz (70.6 kg)  08/31/20 148 lb 9.4 oz (67.4 kg)  08/18/20 156 lb 6.4 oz (70.9 kg)     Objective:    Vital Signs:  There were no vitals taken for this visit.   GENERAL:  Well appearing WM in NAD HEENT:  PERRL, EOMI, sclera are clear. Oropharynx is clear. NECK:  No jugular venous distention, carotid upstroke brisk and symmetric, no bruits, no thyromegaly or adenopathy LUNGS:  Clear to auscultation bilaterally CHEST:  Unremarkable HEART:  RRR,  PMI not displaced or sustained,S1 and S2 within normal limits, no S3, no S4: no clicks, no rubs, no murmurs ABD:  Soft, nontender. BS +, no masses or bruits. No hepatomegaly, no splenomegaly EXT:  2 + pulses throughout, no edema, no cyanosis no clubbing. No radial site hematoma SKIN:  Warm and dry.  No rashes NEURO:  Alert and oriented x 3. Cranial nerves II through XII intact. PSYCH:  Cognitively intact    ASSESSMENT & PLAN:    1.   CAD: s/p PCI of the LCx with DES in May.  Prior stent of the mid LAD in 2011. Repeat cardiac cath in Dec 2020 showed nonobstructive disease.curreenlty angina free.  Antianginal therapy limited by orthostatic hypotension.  2.   PAF: s/p DCCV on October 15, 2019. Recurrent in Feb 2022. Now s/p Tikosyn load. Repeat DCCV on 08/29/20.   Continue Xarelto therapy.  Unable to tolerate  rate control therapy with metoprolol  or amiodarone due to side effects.maintaining NSR so far.  3.   Chronic orthostatic hypotension/ autonomic failure: His blood pressure is stable. He reports some this is doing much better and he has learned to manage  it.   Continue  supportive care as noted in HPI.  4.   Hyperlipidemia: Continue current statin. Excellent control.     Disposition:  6 month   Signed, Peter Martinique, MD  09/08/2020 8:13 AM    [facility]

## 2020-09-11 ENCOUNTER — Other Ambulatory Visit: Payer: Self-pay

## 2020-09-11 ENCOUNTER — Encounter: Payer: Self-pay | Admitting: Cardiology

## 2020-09-11 ENCOUNTER — Ambulatory Visit (INDEPENDENT_AMBULATORY_CARE_PROVIDER_SITE_OTHER): Payer: Medicare Other | Admitting: Cardiology

## 2020-09-11 VITALS — BP 120/60 | HR 78 | Ht 72.0 in | Wt 156.0 lb

## 2020-09-11 DIAGNOSIS — I2510 Atherosclerotic heart disease of native coronary artery without angina pectoris: Secondary | ICD-10-CM | POA: Diagnosis not present

## 2020-09-11 DIAGNOSIS — I951 Orthostatic hypotension: Secondary | ICD-10-CM | POA: Diagnosis not present

## 2020-09-11 DIAGNOSIS — I4819 Other persistent atrial fibrillation: Secondary | ICD-10-CM

## 2020-09-11 DIAGNOSIS — E7800 Pure hypercholesterolemia, unspecified: Secondary | ICD-10-CM

## 2020-09-16 ENCOUNTER — Other Ambulatory Visit: Payer: Self-pay

## 2020-09-16 ENCOUNTER — Ambulatory Visit (HOSPITAL_COMMUNITY)
Admission: RE | Admit: 2020-09-16 | Discharge: 2020-09-16 | Disposition: A | Discharge status: Home / Self Care | Payer: Medicare Other | Source: Ambulatory Visit | Attending: Physician Assistant | Admitting: Physician Assistant

## 2020-09-16 ENCOUNTER — Encounter (HOSPITAL_COMMUNITY): Payer: Self-pay | Admitting: Physician Assistant

## 2020-09-16 VITALS — BP 110/70 | HR 117 | Ht 72.0 in | Wt 157.6 lb

## 2020-09-16 DIAGNOSIS — Z79899 Other long term (current) drug therapy: Secondary | ICD-10-CM | POA: Insufficient documentation

## 2020-09-16 DIAGNOSIS — D6869 Other thrombophilia: Secondary | ICD-10-CM

## 2020-09-16 DIAGNOSIS — I951 Orthostatic hypotension: Secondary | ICD-10-CM | POA: Diagnosis not present

## 2020-09-16 DIAGNOSIS — I4819 Other persistent atrial fibrillation: Secondary | ICD-10-CM | POA: Diagnosis not present

## 2020-09-16 DIAGNOSIS — I2510 Atherosclerotic heart disease of native coronary artery without angina pectoris: Secondary | ICD-10-CM | POA: Insufficient documentation

## 2020-09-16 DIAGNOSIS — Z955 Presence of coronary angioplasty implant and graft: Secondary | ICD-10-CM | POA: Insufficient documentation

## 2020-09-16 DIAGNOSIS — Z7901 Long term (current) use of anticoagulants: Secondary | ICD-10-CM | POA: Diagnosis not present

## 2020-09-16 DIAGNOSIS — I4891 Unspecified atrial fibrillation: Secondary | ICD-10-CM | POA: Diagnosis present

## 2020-09-16 DIAGNOSIS — E785 Hyperlipidemia, unspecified: Secondary | ICD-10-CM | POA: Insufficient documentation

## 2020-09-16 LAB — BASIC METABOLIC PANEL
Anion gap: 9 (ref 5–15)
BUN: 22 mg/dL (ref 8–23)
CO2: 24 mmol/L (ref 22–32)
Calcium: 8.8 mg/dL — ABNORMAL LOW (ref 8.9–10.3)
Chloride: 102 mmol/L (ref 98–111)
Creatinine, Ser: 1.06 mg/dL (ref 0.61–1.24)
GFR, Estimated: >60 mL/min (ref >60)
Glucose, Bld: 103 mg/dL — ABNORMAL HIGH (ref 70–99)
Potassium: 4.2 mmol/L (ref 3.5–5.1)
Sodium: 135 mmol/L (ref 135–145)

## 2020-09-16 LAB — CBC
HCT: 44 % (ref 39.0–52.0)
Hemoglobin: 14.9 g/dL (ref 13.0–17.0)
MCH: 31 pg (ref 26.0–34.0)
MCHC: 33.9 g/dL (ref 30.0–36.0)
MCV: 91.5 fL (ref 80.0–100.0)
Platelets: 231 10*3/uL (ref 150–400)
RBC: 4.81 MIL/uL (ref 4.22–5.81)
RDW: 13.8 % (ref 11.5–15.5)
WBC: 6.9 10*3/uL (ref 4.0–10.5)
nRBC: 0 % (ref 0.0–0.2)

## 2020-09-16 LAB — MAGNESIUM: Magnesium: 2.4 mg/dL (ref 1.7–2.4)

## 2020-09-16 NOTE — H&P (View-Only) (Signed)
Primary Care Physician: Antony Contras, MD Primary Cardiologist: Dr Martinique Primary Electrophysiologist: Dr Caryl Comes Referring Physician: Zacarias Pontes [facility]   Jerry Barr is a 80 y.o. male with a history of paroxysmal atrial fibrillation, CAD, BPH, HLD, and chronic hypotension who presents for follow up in the [facility]. He is on Xarelto for a CHADS2VASC score of 3. Patient seen by Dr Martinique 06/20/19 with symptoms of exertional neck and shoulder pain similar to his previous anginal symptoms. LHC 06/25/19 showed nonobstructive disease with patent stents. His amiodarone was discontinued 2/2 side effects with autonomic dysfunction. He was in his usual health until 08/15/20 when he started having symptoms of fatigue and intermittent elevated heart rates. He underwent DCCV on 08/21/20 but unfortunately had early return of his afib and went to the ED on 08/25/20. He was loaded on dofetilide at that time and underwent repeat DCCV on 08/29/20.   On follow up today, patient reports that he woke early Monday morning and felt he was back in afib. He reports that he had a very busy Sunday at church and was on his feet for several hours at a time. Hr feels this precipitated this most recent episode. He has symptoms of dizziness.   Today, he denies symptoms of chest pain, shortness of breath, orthopnea, PND, lower extremity edema, syncope, snoring, daytime somnolence, bleeding, or neurologic sequela. The patient is tolerating medications without difficulties and is otherwise without complaint today.    Atrial Fibrillation Risk Factors:  he does not have symptoms or diagnosis of sleep apnea. he does not have a history of alcohol use. The patient does not have a history of early familial atrial fibrillation or other arrhythmias.  he has a BMI of Body mass index is 21.37 kg/m.Marland Kitchen Filed Weights   09/16/20 1509  Weight: 71.5 kg    Family History  Problem Relation Age of Onset  . Breast  cancer Mother   . Colon cancer Mother   . CAD Father   . Heart attack Paternal Grandmother   . Colon cancer Brother   . Esophageal cancer Brother   . Liver cancer Brother   . Cancer Brother   . Leukemia Paternal Uncle   . Bone cancer Paternal Grandfather   . Pancreatic cancer Neg Hx   . Prostate cancer Neg Hx   . Rectal cancer Neg Hx   . Stomach cancer Neg Hx      Atrial Fibrillation Management history:  Previous antiarrhythmic drugs: amiodarone, dofetilide  Previous cardioversions: 2017, 10/15/18, 08/21/20, 08/29/20 Previous ablations: none CHADS2VASC score: 3 Anticoagulation history: Xarelto    Past Medical History:  Diagnosis Date  . Allergy   . Aortic atherosclerosis ([place])   . Atrial fibrillation ([place])   . BPH (benign prostatic hyperplasia)   . CAD (coronary artery disease)   . Cancer (HCC)    basal cell ca - face, nose and right leg  . Clotting disorder (HCC)    a fib hx - Xarelto  . Coronary artery disease    Promus drug-eluting stent to a 99% mid LAD, 40-50% narrowing in the distal LAD with mild to moderate disease in the circ and RCA b. 5/6 PCI/DESx1 to mLcx  . Dyslipidemia   . ED (erectile dysfunction)   . Hearing loss   . Hyperplastic colon polyp   . Hypoglycemia   . Orthostatic hypotension   . REM sleep behavior disorder    not tested   Past Surgical History:  Procedure Laterality Date  .  BUBBLE STUDY  08/27/2020   Procedure: BUBBLE STUDY;  Surgeon: Freada Bergeron, MD;  Location: [place];  Service: Cardiovascular;;  . CARDIOVERSION N/A 04/23/2016   Procedure: CARDIOVERSION;  Surgeon: [name], MD;  Location: [place];  Service: Cardiovascular;  Laterality: N/A;  . CARDIOVERSION N/A 08/21/2020   Procedure: CARDIOVERSION;  Surgeon: Dorothy Spark, MD;  Location: [facility] ENDOSCOPY;  Service: Cardiovascular;  Laterality: N/A;  . CARDIOVERSION N/A 08/29/2020   Procedure: CARDIOVERSION;  Surgeon: [name], MD;  Location: [facility] ENDOSCOPY;   Service: Cardiovascular;  Laterality: N/A;  . COLONOSCOPY    . CORONARY ANGIOPLASTY WITH STENT PLACEMENT    . CORONARY STENT INTERVENTION N/A 11/15/2018   Procedure: CORONARY STENT INTERVENTION;  Surgeon: Burnell Blanks, MD;  Location: [place] CV LAB;  Service: Cardiovascular;  Laterality: N/A;  . LEFT HEART CATH AND CORONARY ANGIOGRAPHY N/A 11/15/2018   Procedure: LEFT HEART CATH AND CORONARY ANGIOGRAPHY;  Surgeon: Burnell Blanks, MD;  Location: [place] CV LAB;  Service: Cardiovascular;  Laterality: N/A;  . LEFT HEART CATH AND CORONARY ANGIOGRAPHY N/A 06/25/2019   Procedure: LEFT HEART CATH AND CORONARY ANGIOGRAPHY;  Surgeon: Martinique, Peter M, MD;  Location: [place] CV LAB;  Service: Cardiovascular;  Laterality: N/A;  . POLYPECTOMY    . TEE WITHOUT CARDIOVERSION N/A 04/23/2016   Procedure: TRANSESOPHAGEAL ECHOCARDIOGRAM (TEE);  Surgeon: [name], MD;  Location: [place];  Service: Cardiovascular;  Laterality: N/A;  . TEE WITHOUT CARDIOVERSION N/A 08/27/2020   Procedure: TRANSESOPHAGEAL ECHOCARDIOGRAM (TEE);  Surgeon: Freada Bergeron, MD;  Location: [facility] ENDOSCOPY;  Service: Cardiovascular;  Laterality: N/A;  . UPPER GASTROINTESTINAL ENDOSCOPY      Current Outpatient Medications  Medication Sig Dispense Refill  . acetaminophen (TYLENOL) 500 MG tablet Take 500 mg by mouth every 6 (six) hours as needed for mild pain (or headaches).    Marland Kitchen atorvastatin (LIPITOR) 20 MG tablet TAKE 1 TABLET BY MOUTH EVERY DAY 90 tablet 3  . Cholecalciferol (VITAMIN D-3) 1000 UNITS CAPS Take 1,000 Units by mouth daily.    Marland Kitchen dofetilide (TIKOSYN) 500 MCG capsule Take 1 capsule (500 mcg total) by mouth 2 (two) times daily. 60 capsule 0  . fluticasone (FLONASE) 50 MCG/ACT nasal spray Place 1 spray into both nostrils daily as needed (seasonal allergies).     . hydrocortisone cream 1 % Apply 1 application topically 3 (three) times daily as needed for itching (skin irritation). 30 g 0  .  loratadine (CLARITIN) 10 MG tablet Take 10 mg by mouth daily.    . Melatonin 3 MG TABS Take 6 mg by mouth at bedtime.    . midodrine (PROAMATINE) 5 MG tablet Take 5 mg by mouth See admin instructions. Take 5 mg by mouth in the morning and at lunchtime    . polyethylene glycol (MIRALAX / GLYCOLAX) 17 g packet Take 17 g by mouth daily as needed for mild constipation (MIX INTO 8 OUNCES OF WATER AND DRINK).    . rivaroxaban (XARELTO) 20 MG TABS tablet Take 1 tablet (20 mg total) by mouth daily with breakfast. 30 tablet 6  . Wheat Dextrin (BENEFIBER PO) Take by mouth See admin instructions. Mix 2 teaspoonsful of powder into 6 ounces of water and drink once a day as needed for mild constipation or to add fiber to the diet     No current facility-administered medications for this encounter.    Allergies  Allergen Reactions  . Penicillins Rash    Did it  involve swelling of the face/tongue/throat, SOB, or low BP? No Did it involve sudden or severe rash/hives, skin peeling, or any reaction on the inside of your mouth or nose? No Did you need to seek medical attention at a hospital or doctor's office? No When did it last happen?50+ years  If all above answers are "NO", may proceed with cephalosporin use.     Social History   Socioeconomic History  . Marital status: Married    Spouse name: Sheria Lang  . Number of children: Not on file  . Years of education: Not on file  . Highest education level: Professional school degree (e.g., MD, DDS, DVM, JD)  Occupational History    Comment: retired  CE  Tobacco Use  . Smoking status: Never Smoker  . Smokeless tobacco: Never Used  Vaping Use  . Vaping Use: Never used  Substance and Sexual Activity  . Alcohol use: Yes    Alcohol/week: 4.0 standard drinks    Types: 4 Glasses of wine per week    Comment: 3oz of red wine  . Drug use: No  . Sexual activity: Not Currently  Other Topics Concern  . Not on file  Social History Narrative   Lives  with wife   Almost no caffeine   Social Determinants of Health   Financial Resource Strain: Not on file  Food Insecurity: Not on file  Transportation Needs: Not on file  Physical Activity: Not on file  Stress: Not on file  Social Connections: Not on file  Intimate Partner Violence: Not on file     ROS- All systems are reviewed and negative except as per the HPI above.  Physical Exam: Vitals:   09/16/20 1509  BP: 110/70  Pulse: (!) 117  Weight: 71.5 kg  Height: 6' (1.829 m)    GEN- The patient is a well appearing elderly male, alert and oriented x 3 today.   HEENT-head normocephalic, atraumatic, sclera clear, conjunctiva pink, hearing intact, trachea midline. Lungs- Clear to ausculation bilaterally, normal work of breathing Heart- irregular rate and rhythm, no murmurs, rubs or gallops  GI- soft, NT, ND, + BS Extremities- no clubbing, cyanosis, or edema MS- no significant deformity or atrophy Skin- no rash or lesion Psych- euthymic mood, full affect Neuro- strength and sensation are intact   Wt Readings from Last 3 Encounters:  09/16/20 71.5 kg  09/11/20 70.8 kg  09/05/20 70.6 kg    EKG today demonstrates  Afib  Vent. rate 117 BPM PR interval * ms QRS duration 82 ms QT/QTc 352/491 ms  Echo 12/14/19 demonstrated  1. Left ventricular ejection fraction, by estimation, is 55 to 60%. The  left ventricle has normal function. The left ventricle has no regional  wall motion abnormalities. Left ventricular diastolic parameters were  normal. The average left ventricular  global longitudinal strain is -20.5 %. The global longitudinal strain is  normal.  2. Right ventricular systolic function is normal. The right ventricular  size is normal. There is normal pulmonary artery systolic pressure. The  estimated right ventricular systolic pressure is 24.5 mmHg.  3. The mitral valve is normal in structure. Mild mitral valve  regurgitation. No evidence of mitral stenosis.   4. The aortic valve is normal in structure. Aortic valve regurgitation is  not visualized. No aortic stenosis is present.  5. The inferior vena cava is normal in size with greater than 50%  respiratory variability, suggesting right atrial pressure of 3 mmHg.   Epic records are reviewed at  length today  Assessment and Plan:  1. Persistent atrial fibrillation S/p dofetilide loading 2/15-2/19/22 Unfortunately he is back in afib today. We discussed therapeutic options. Will try DCCV again since he was just loaded on dofetilide.  Check bmet/CBC/mag Continue Xarelto 20 mg daily. Patient denies any missed doses in the last 3 weeks.  Continue dofetilide 500 mcg BID  This patients CHA2DS2-VASc Score and unadjusted Ischemic Stroke Rate (% per year) is equal to 3.2 % stroke rate/year from a score of 3  Above score calculated as 1 point each if present [CHF, HTN, DM, Vascular=MI/PAD/Aortic Plaque, Age if 65-74, or Male] Above score calculated as 2 points each if present [Age > 75, or Stroke/TIA/TE]  2. CAD S/p DES to LAD 2011 and mid cirfumflex 11/2018. Repeat LHC 06/25/19 showed nonobstructive disease and patent stents. No anginal symptoms.  3. Orthostatic Hypotension On midodrine PRN, followed by Dr Caryl Comes. BP stable today.    Follow up with Oda Kilts as scheduled.    [facility] [address] [place], [place] [address] [phone] 09/16/2020 3:36 PM

## 2020-09-16 NOTE — Patient Instructions (Signed)
Cardioversion scheduled for Wednesday, March 16th  - Arrive at the [facility] and go to admitting at 930AM  - Do not eat or drink anything after midnight the night prior to your procedure.  - Take all your morning medication (except diabetic medications) with a sip of water prior to arrival.  - You will not be able to drive home after your procedure.  - Do NOT miss any doses of your blood thinner - if you should miss a dose please notify our office immediately.  - If you feel as if you go back into normal rhythm prior to scheduled cardioversion, please notify our office immediately. If your procedure is canceled in the cardioversion suite you will be charged a cancellation fee.

## 2020-09-16 NOTE — Progress Notes (Signed)
Primary Care Physician: Antony Contras, MD Primary Cardiologist: Dr Martinique Primary Electrophysiologist: Dr Caryl Comes Referring Physician: Zacarias Pontes ER   Jerry Barr is a 80 y.o. male with a history of paroxysmal atrial fibrillation, CAD, BPH, HLD, and chronic hypotension who presents for follow up in the [facility]. He is on Xarelto for a CHADS2VASC score of 3. Patient seen by Dr Martinique 06/20/19 with symptoms of exertional neck and shoulder pain similar to his previous anginal symptoms. LHC 06/25/19 showed nonobstructive disease with patent stents. His amiodarone was discontinued 2/2 side effects with autonomic dysfunction. He was in his usual health until 08/15/20 when he started having symptoms of fatigue and intermittent elevated heart rates. He underwent DCCV on 08/21/20 but unfortunately had early return of his afib and went to the ED on 08/25/20. He was loaded on dofetilide at that time and underwent repeat DCCV on 08/29/20.   On follow up today, patient reports that he woke early Monday morning and felt he was back in afib. He reports that he had a very busy Sunday at church and was on his feet for several hours at a time. Hr feels this precipitated this most recent episode. He has symptoms of dizziness.   Today, he denies symptoms of chest pain, shortness of breath, orthopnea, PND, lower extremity edema, syncope, snoring, daytime somnolence, bleeding, or neurologic sequela. The patient is tolerating medications without difficulties and is otherwise without complaint today.    Atrial Fibrillation Risk Factors:  he does not have symptoms or diagnosis of sleep apnea. he does not have a history of alcohol use. The patient does not have a history of early familial atrial fibrillation or other arrhythmias.  he has a BMI of Body mass index is 21.37 kg/m.Marland Kitchen Filed Weights   09/16/20 1509  Weight: 71.5 kg    Family History  Problem Relation Age of Onset  . Breast  cancer Mother   . Colon cancer Mother   . CAD Father   . Heart attack Paternal Grandmother   . Colon cancer Brother   . Esophageal cancer Brother   . Liver cancer Brother   . Cancer Brother   . Leukemia Paternal Uncle   . Bone cancer Paternal Grandfather   . Pancreatic cancer Neg Hx   . Prostate cancer Neg Hx   . Rectal cancer Neg Hx   . Stomach cancer Neg Hx      Atrial Fibrillation Management history:  Previous antiarrhythmic drugs: amiodarone, dofetilide  Previous cardioversions: 2017, 10/15/18, 08/21/20, 08/29/20 Previous ablations: none CHADS2VASC score: 3 Anticoagulation history: Xarelto    Past Medical History:  Diagnosis Date  . Allergy   . Aortic atherosclerosis ([place])   . Atrial fibrillation ([place])   . BPH (benign prostatic hyperplasia)   . CAD (coronary artery disease)   . Cancer (HCC)    basal cell ca - face, nose and right leg  . Clotting disorder (HCC)    a fib hx - Xarelto  . Coronary artery disease    Promus drug-eluting stent to a 99% mid LAD, 40-50% narrowing in the distal LAD with mild to moderate disease in the circ and RCA b. 5/6 PCI/DESx1 to mLcx  . Dyslipidemia   . ED (erectile dysfunction)   . Hearing loss   . Hyperplastic colon polyp   . Hypoglycemia   . Orthostatic hypotension   . REM sleep behavior disorder    not tested   Past Surgical History:  Procedure Laterality Date  .  BUBBLE STUDY  08/27/2020   Procedure: BUBBLE STUDY;  Surgeon: Freada Bergeron, MD;  Location: [place];  Service: Cardiovascular;;  . CARDIOVERSION N/A 04/23/2016   Procedure: CARDIOVERSION;  Surgeon: [name], MD;  Location: [place];  Service: Cardiovascular;  Laterality: N/A;  . CARDIOVERSION N/A 08/21/2020   Procedure: CARDIOVERSION;  Surgeon: Dorothy Spark, MD;  Location: [facility] ENDOSCOPY;  Service: Cardiovascular;  Laterality: N/A;  . CARDIOVERSION N/A 08/29/2020   Procedure: CARDIOVERSION;  Surgeon: [name], MD;  Location: [facility] ENDOSCOPY;   Service: Cardiovascular;  Laterality: N/A;  . COLONOSCOPY    . CORONARY ANGIOPLASTY WITH STENT PLACEMENT    . CORONARY STENT INTERVENTION N/A 11/15/2018   Procedure: CORONARY STENT INTERVENTION;  Surgeon: Burnell Blanks, MD;  Location: [place] CV LAB;  Service: Cardiovascular;  Laterality: N/A;  . LEFT HEART CATH AND CORONARY ANGIOGRAPHY N/A 11/15/2018   Procedure: LEFT HEART CATH AND CORONARY ANGIOGRAPHY;  Surgeon: Burnell Blanks, MD;  Location: [place] CV LAB;  Service: Cardiovascular;  Laterality: N/A;  . LEFT HEART CATH AND CORONARY ANGIOGRAPHY N/A 06/25/2019   Procedure: LEFT HEART CATH AND CORONARY ANGIOGRAPHY;  Surgeon: Martinique, Peter M, MD;  Location: [place] CV LAB;  Service: Cardiovascular;  Laterality: N/A;  . POLYPECTOMY    . TEE WITHOUT CARDIOVERSION N/A 04/23/2016   Procedure: TRANSESOPHAGEAL ECHOCARDIOGRAM (TEE);  Surgeon: [name], MD;  Location: [place];  Service: Cardiovascular;  Laterality: N/A;  . TEE WITHOUT CARDIOVERSION N/A 08/27/2020   Procedure: TRANSESOPHAGEAL ECHOCARDIOGRAM (TEE);  Surgeon: Freada Bergeron, MD;  Location: [facility] ENDOSCOPY;  Service: Cardiovascular;  Laterality: N/A;  . UPPER GASTROINTESTINAL ENDOSCOPY      Current Outpatient Medications  Medication Sig Dispense Refill  . acetaminophen (TYLENOL) 500 MG tablet Take 500 mg by mouth every 6 (six) hours as needed for mild pain (or headaches).    Marland Kitchen atorvastatin (LIPITOR) 20 MG tablet TAKE 1 TABLET BY MOUTH EVERY DAY 90 tablet 3  . Cholecalciferol (VITAMIN D-3) 1000 UNITS CAPS Take 1,000 Units by mouth daily.    Marland Kitchen dofetilide (TIKOSYN) 500 MCG capsule Take 1 capsule (500 mcg total) by mouth 2 (two) times daily. 60 capsule 0  . fluticasone (FLONASE) 50 MCG/ACT nasal spray Place 1 spray into both nostrils daily as needed (seasonal allergies).     . hydrocortisone cream 1 % Apply 1 application topically 3 (three) times daily as needed for itching (skin irritation). 30 g 0  .  loratadine (CLARITIN) 10 MG tablet Take 10 mg by mouth daily.    . Melatonin 3 MG TABS Take 6 mg by mouth at bedtime.    . midodrine (PROAMATINE) 5 MG tablet Take 5 mg by mouth See admin instructions. Take 5 mg by mouth in the morning and at lunchtime    . polyethylene glycol (MIRALAX / GLYCOLAX) 17 g packet Take 17 g by mouth daily as needed for mild constipation (MIX INTO 8 OUNCES OF WATER AND DRINK).    . rivaroxaban (XARELTO) 20 MG TABS tablet Take 1 tablet (20 mg total) by mouth daily with breakfast. 30 tablet 6  . Wheat Dextrin (BENEFIBER PO) Take by mouth See admin instructions. Mix 2 teaspoonsful of powder into 6 ounces of water and drink once a day as needed for mild constipation or to add fiber to the diet     No current facility-administered medications for this encounter.    Allergies  Allergen Reactions  . Penicillins Rash    Did it  involve swelling of the face/tongue/throat, SOB, or low BP? No Did it involve sudden or severe rash/hives, skin peeling, or any reaction on the inside of your mouth or nose? No Did you need to seek medical attention at a hospital or doctor's office? No When did it last happen?50+ years  If all above answers are "NO", may proceed with cephalosporin use.     Social History   Socioeconomic History  . Marital status: Married    Spouse name: Sheria Lang  . Number of children: Not on file  . Years of education: Not on file  . Highest education level: Professional school degree (e.g., MD, DDS, DVM, JD)  Occupational History    Comment: retired  CE  Tobacco Use  . Smoking status: Never Smoker  . Smokeless tobacco: Never Used  Vaping Use  . Vaping Use: Never used  Substance and Sexual Activity  . Alcohol use: Yes    Alcohol/week: 4.0 standard drinks    Types: 4 Glasses of wine per week    Comment: 3oz of red wine  . Drug use: No  . Sexual activity: Not Currently  Other Topics Concern  . Not on file  Social History Narrative   Lives  with wife   Almost no caffeine   Social Determinants of Health   Financial Resource Strain: Not on file  Food Insecurity: Not on file  Transportation Needs: Not on file  Physical Activity: Not on file  Stress: Not on file  Social Connections: Not on file  Intimate Partner Violence: Not on file     ROS- All systems are reviewed and negative except as per the HPI above.  Physical Exam: Vitals:   09/16/20 1509  BP: 110/70  Pulse: (!) 117  Weight: 71.5 kg  Height: 6' (1.829 m)    GEN- The patient is a well appearing elderly male, alert and oriented x 3 today.   HEENT-head normocephalic, atraumatic, sclera clear, conjunctiva pink, hearing intact, trachea midline. Lungs- Clear to ausculation bilaterally, normal work of breathing Heart- irregular rate and rhythm, no murmurs, rubs or gallops  GI- soft, NT, ND, + BS Extremities- no clubbing, cyanosis, or edema MS- no significant deformity or atrophy Skin- no rash or lesion Psych- euthymic mood, full affect Neuro- strength and sensation are intact   Wt Readings from Last 3 Encounters:  09/16/20 71.5 kg  09/11/20 70.8 kg  09/05/20 70.6 kg    EKG today demonstrates  Afib  Vent. rate 117 BPM PR interval * ms QRS duration 82 ms QT/QTc 352/491 ms  Echo 12/14/19 demonstrated  1. Left ventricular ejection fraction, by estimation, is 55 to 60%. The  left ventricle has normal function. The left ventricle has no regional  wall motion abnormalities. Left ventricular diastolic parameters were  normal. The average left ventricular  global longitudinal strain is -20.5 %. The global longitudinal strain is  normal.  2. Right ventricular systolic function is normal. The right ventricular  size is normal. There is normal pulmonary artery systolic pressure. The  estimated right ventricular systolic pressure is 63.8 mmHg.  3. The mitral valve is normal in structure. Mild mitral valve  regurgitation. No evidence of mitral stenosis.   4. The aortic valve is normal in structure. Aortic valve regurgitation is  not visualized. No aortic stenosis is present.  5. The inferior vena cava is normal in size with greater than 50%  respiratory variability, suggesting right atrial pressure of 3 mmHg.   Epic records are reviewed at  length today  Assessment and Plan:  1. Persistent atrial fibrillation S/p dofetilide loading 2/15-2/19/22 Unfortunately he is back in afib today. We discussed therapeutic options. Will try DCCV again since he was just loaded on dofetilide.  Check bmet/CBC/mag Continue Xarelto 20 mg daily. Patient denies any missed doses in the last 3 weeks.  Continue dofetilide 500 mcg BID  This patients CHA2DS2-VASc Score and unadjusted Ischemic Stroke Rate (% per year) is equal to 3.2 % stroke rate/year from a score of 3  Above score calculated as 1 point each if present [CHF, HTN, DM, Vascular=MI/PAD/Aortic Plaque, Age if 65-74, or Male] Above score calculated as 2 points each if present [Age > 75, or Stroke/TIA/TE]  2. CAD S/p DES to LAD 2011 and mid cirfumflex 11/2018. Repeat LHC 06/25/19 showed nonobstructive disease and patent stents. No anginal symptoms.  3. Orthostatic Hypotension On midodrine PRN, followed by Dr Caryl Comes. BP stable today.    Follow up with Oda Kilts as scheduled.    [facility] [address] [place], [place] [address] [phone] 09/16/2020 3:36 PM

## 2020-09-19 ENCOUNTER — Other Ambulatory Visit: Payer: Self-pay

## 2020-09-19 ENCOUNTER — Ambulatory Visit
Admission: RE | Admit: 2020-09-19 | Discharge: 2020-09-19 | Disposition: A | Discharge status: Home / Self Care | Payer: Medicare Other | Source: Ambulatory Visit | Attending: Gastroenterology | Admitting: Gastroenterology

## 2020-09-19 DIAGNOSIS — Q625 Duplication of ureter: Secondary | ICD-10-CM | POA: Diagnosis not present

## 2020-09-19 DIAGNOSIS — R1031 Right lower quadrant pain: Secondary | ICD-10-CM

## 2020-09-19 DIAGNOSIS — N400 Enlarged prostate without lower urinary tract symptoms: Secondary | ICD-10-CM | POA: Diagnosis not present

## 2020-09-19 DIAGNOSIS — K5904 Chronic idiopathic constipation: Secondary | ICD-10-CM

## 2020-09-19 DIAGNOSIS — Q630 Accessory kidney: Secondary | ICD-10-CM | POA: Diagnosis not present

## 2020-09-19 DIAGNOSIS — K5750 Diverticulosis of both small and large intestine without perforation or abscess without bleeding: Secondary | ICD-10-CM | POA: Diagnosis not present

## 2020-09-19 MED ORDER — IOPAMIDOL (ISOVUE-300) INJECTION 61%
100.0000 mL | Freq: Once | INTRAVENOUS | Status: AC | PRN
  Administered 2020-09-19: 100 mL via INTRAVENOUS

## 2020-09-22 ENCOUNTER — Other Ambulatory Visit (HOSPITAL_COMMUNITY)
Admission: RE | Admit: 2020-09-22 | Discharge: 2020-09-22 | Disposition: A | Discharge status: Home / Self Care | Payer: Medicare Other | Source: Ambulatory Visit | Attending: Cardiology | Admitting: Cardiology

## 2020-09-22 DIAGNOSIS — N401 Enlarged prostate with lower urinary tract symptoms: Secondary | ICD-10-CM | POA: Diagnosis not present

## 2020-09-22 DIAGNOSIS — E7800 Pure hypercholesterolemia, unspecified: Secondary | ICD-10-CM | POA: Diagnosis not present

## 2020-09-22 DIAGNOSIS — I4891 Unspecified atrial fibrillation: Secondary | ICD-10-CM | POA: Diagnosis not present

## 2020-09-22 DIAGNOSIS — I2510 Atherosclerotic heart disease of native coronary artery without angina pectoris: Secondary | ICD-10-CM | POA: Diagnosis not present

## 2020-09-22 DIAGNOSIS — Z20822 Contact with and (suspected) exposure to covid-19: Secondary | ICD-10-CM | POA: Insufficient documentation

## 2020-09-22 DIAGNOSIS — Z01812 Encounter for preprocedural laboratory examination: Secondary | ICD-10-CM | POA: Diagnosis not present

## 2020-09-22 DIAGNOSIS — E785 Hyperlipidemia, unspecified: Secondary | ICD-10-CM | POA: Diagnosis not present

## 2020-09-22 DIAGNOSIS — I480 Paroxysmal atrial fibrillation: Secondary | ICD-10-CM | POA: Diagnosis not present

## 2020-09-22 LAB — SARS CORONAVIRUS 2 (TAT 6-24 HRS): SARS Coronavirus 2: NEGATIVE

## 2020-09-24 ENCOUNTER — Encounter (HOSPITAL_COMMUNITY)
Admission: RE | Disposition: A | Discharge status: Home / Self Care | Payer: Self-pay | Source: Home / Self Care | Attending: Cardiology

## 2020-09-24 ENCOUNTER — Ambulatory Visit (HOSPITAL_COMMUNITY): Payer: Medicare Other | Admitting: Anesthesiology

## 2020-09-24 ENCOUNTER — Other Ambulatory Visit: Payer: Self-pay

## 2020-09-24 ENCOUNTER — Ambulatory Visit (HOSPITAL_COMMUNITY)
Admission: RE | Admit: 2020-09-24 | Discharge: 2020-09-24 | Disposition: A | Discharge status: Home / Self Care | Payer: Medicare Other | Attending: Cardiology | Admitting: Cardiology

## 2020-09-24 ENCOUNTER — Encounter (HOSPITAL_COMMUNITY): Payer: Self-pay | Admitting: Cardiology

## 2020-09-24 DIAGNOSIS — Z955 Presence of coronary angioplasty implant and graft: Secondary | ICD-10-CM | POA: Diagnosis not present

## 2020-09-24 DIAGNOSIS — I2510 Atherosclerotic heart disease of native coronary artery without angina pectoris: Secondary | ICD-10-CM | POA: Diagnosis not present

## 2020-09-24 DIAGNOSIS — Z7901 Long term (current) use of anticoagulants: Secondary | ICD-10-CM | POA: Insufficient documentation

## 2020-09-24 DIAGNOSIS — Z880 Allergy status to penicillin: Secondary | ICD-10-CM | POA: Diagnosis not present

## 2020-09-24 DIAGNOSIS — I4819 Other persistent atrial fibrillation: Secondary | ICD-10-CM | POA: Diagnosis not present

## 2020-09-24 DIAGNOSIS — E785 Hyperlipidemia, unspecified: Secondary | ICD-10-CM | POA: Diagnosis not present

## 2020-09-24 DIAGNOSIS — Z79899 Other long term (current) drug therapy: Secondary | ICD-10-CM | POA: Diagnosis not present

## 2020-09-24 DIAGNOSIS — I480 Paroxysmal atrial fibrillation: Secondary | ICD-10-CM | POA: Diagnosis not present

## 2020-09-24 DIAGNOSIS — I25110 Atherosclerotic heart disease of native coronary artery with unstable angina pectoris: Secondary | ICD-10-CM | POA: Diagnosis not present

## 2020-09-24 DIAGNOSIS — I951 Orthostatic hypotension: Secondary | ICD-10-CM | POA: Diagnosis not present

## 2020-09-24 HISTORY — PX: CARDIOVERSION: SHX1299

## 2020-09-24 SURGERY — CARDIOVERSION
Anesthesia: General

## 2020-09-24 MED ORDER — PROPOFOL 10 MG/ML IV BOLUS
INTRAVENOUS | Status: DC | PRN
  Administered 2020-09-24: 60 mg via INTRAVENOUS

## 2020-09-24 MED ORDER — SODIUM CHLORIDE 0.9 % IV SOLN
INTRAVENOUS | Status: AC | PRN
  Administered 2020-09-24: 500 mL via INTRAVENOUS

## 2020-09-24 MED ORDER — LIDOCAINE 2% (20 MG/ML) 5 ML SYRINGE
INTRAMUSCULAR | Status: DC | PRN
  Administered 2020-09-24: 40 mg via INTRAVENOUS

## 2020-09-24 NOTE — Transfer of Care (Signed)
Immediate Anesthesia Transfer of Care Note  Patient: Jerry Barr  Procedure(s) Performed: CARDIOVERSION (N/A )  Patient Location: PACU and Endoscopy Unit  Anesthesia Type:General  Level of Consciousness: drowsy and patient cooperative  Airway & Oxygen Therapy: Patient Spontanous Breathing and Patient connected to nasal cannula oxygen  Post-op Assessment: Report given to RN and Post -op Vital signs reviewed and stable  Post vital signs: Reviewed and stable  Last Vitals:  Vitals Value Taken Time  BP    Temp    Pulse    Resp    SpO2      Last Pain:  Vitals:   09/24/20 0939  TempSrc: Oral  PainSc: 0-No pain         Complications: No complications documented.

## 2020-09-24 NOTE — Discharge Instructions (Signed)
Electrical Cardioversion Electrical cardioversion is the delivery of a jolt of electricity to restore a normal rhythm to the heart. A rhythm that is too fast or is not regular keeps the heart from pumping well. In this procedure, sticky patches or metal paddles are placed on the chest to deliver electricity to the heart from a device. This procedure may be done in an emergency if:  There is low or no blood pressure as a result of the heart rhythm.  Normal rhythm must be restored as fast as possible to protect the brain and heart from further damage.  It may save a life. This may also be a scheduled procedure for irregular or fast heart rhythms that are not immediately life-threatening. Tell a health care provider about:  Any allergies you have.  All medicines you are taking, including vitamins, herbs, eye drops, creams, and over-the-counter medicines.  Any problems you or family members have had with anesthetic medicines.  Any blood disorders you have.  Any surgeries you have had.  Any medical conditions you have.  Whether you are pregnant or may be pregnant. What are the risks? Generally, this is a safe procedure. However, problems may occur, including:  Allergic reactions to medicines.  A blood clot that breaks free and travels to other parts of your body.  The possible return of an abnormal heart rhythm within hours or days after the procedure.  Your heart stopping (cardiac arrest). This is rare. What happens before the procedure? Medicines  Your health care provider may have you start taking: ? Blood-thinning medicines (anticoagulants) so your blood does not clot as easily. ? Medicines to help stabilize your heart rate and rhythm.  Ask your health care provider about: ? Changing or stopping your regular medicines. This is especially important if you are taking diabetes medicines or blood thinners. ? Taking medicines such as aspirin and ibuprofen. These medicines can  thin your blood. Do not take these medicines unless your health care provider tells you to take them. ? Taking over-the-counter medicines, vitamins, herbs, and supplements. General instructions  Follow instructions from your health care provider about eating or drinking restrictions.  Plan to have someone take you home from the hospital or clinic.  If you will be going home right after the procedure, plan to have someone with you for 24 hours.  Ask your health care provider what steps will be taken to help prevent infection. These may include washing your skin with a germ-killing soap. What happens during the procedure?  An IV will be inserted into one of your veins.  Sticky patches (electrodes) or metal paddles may be placed on your chest.  You will be given a medicine to help you relax (sedative).  An electrical shock will be delivered. The procedure may vary among health care providers and hospitals.   What can I expect after the procedure?  Your blood pressure, heart rate, breathing rate, and blood oxygen level will be monitored until you leave the hospital or clinic.  Your heart rhythm will be watched to make sure it does not change.  You may have some redness on the skin where the shocks were given. Follow these instructions at home:  Do not drive for 24 hours if you were given a sedative during your procedure.  Take over-the-counter and prescription medicines only as told by your health care provider.  Ask your health care provider how to check your pulse. Check it often.  Rest for 48 hours after the procedure   or as told by your health care provider.  Avoid or limit your caffeine use as told by your health care provider.  Keep all follow-up visits as told by your health care provider. This is important. Contact a health care provider if:  You feel like your heart is beating too quickly or your pulse is not regular.  You have a serious muscle cramp that does not go  away. Get help right away if:  You have discomfort in your chest.  You are dizzy or you feel faint.  You have trouble breathing or you are short of breath.  Your speech is slurred.  You have trouble moving an arm or leg on one side of your body.  Your fingers or toes turn cold or blue. Summary  Electrical cardioversion is the delivery of a jolt of electricity to restore a normal rhythm to the heart.  This procedure may be done right away in an emergency or may be a scheduled procedure if the condition is not an emergency.  Generally, this is a safe procedure.  After the procedure, check your pulse often as told by your health care provider. This information is not intended to replace advice given to you by your health care provider. Make sure you discuss any questions you have with your health care provider. Document Revised: 01/29/2019 Document Reviewed: 01/29/2019 Elsevier Patient Education  2021 Elsevier Inc.  

## 2020-09-24 NOTE — Interval H&P Note (Signed)
History and Physical Interval Note:  09/24/2020 9:40 AM  Jerry Barr  has presented today for surgery, with the diagnosis of AFIB.  The various methods of treatment have been discussed with the patient and family. After consideration of risks, benefits and other options for treatment, the patient has consented to  Procedure(s): CARDIOVERSION (N/A) as a surgical intervention.  The patient's history has been reviewed, patient examined, no change in status, stable for surgery.  I have reviewed the patient's chart and labs.  Questions were answered to the patient's satisfaction.     Freada Bergeron

## 2020-09-24 NOTE — Procedures (Signed)
Procedure: Electrical Cardioversion Indications:  Atrial Fibrillation  Procedure Details:  Consent: Risks of procedure as well as the alternatives and risks of each were explained to the (patient/caregiver).  Consent for procedure obtained.  Time Out: Verified patient identification, verified procedure, site/side was marked, verified correct patient position, special equipment/implants available, medications/allergies/relevent history reviewed, required imaging and test results available. PERFORMED.  Patient placed on cardiac monitor, pulse oximetry, supplemental oxygen as necessary.  Sedation given:lidocaine 40mg ; propofol 60mg  Pacer pads placed anterior and posterior chest.  Cardioverted 1 time(s).  Cardioversion with synchronized biphasic 120J shock.  Evaluation: Findings: Post procedure EKG shows: NSR Complications: None Patient did tolerate procedure well.  Time Spent Directly with the Patient:  62minutes   Freada Bergeron 09/24/2020, 10:15 AM

## 2020-09-24 NOTE — Anesthesia Preprocedure Evaluation (Signed)
Anesthesia Evaluation  Patient identified by MRN, date of birth, ID band Patient awake    Reviewed: Allergy & Precautions, H&P , NPO status , Patient's Chart, lab work & pertinent test results  Airway Mallampati: II   Neck ROM: full    Dental   Pulmonary neg pulmonary ROS,    breath sounds clear to auscultation       Cardiovascular + angina + CAD and + Cardiac Stents  + dysrhythmias Atrial Fibrillation  Rhythm:irregular Rate:Normal     Neuro/Psych    GI/Hepatic   Endo/Other    Renal/GU      Musculoskeletal   Abdominal   Peds  Hematology   Anesthesia Other Findings   Reproductive/Obstetrics                             Anesthesia Physical Anesthesia Plan  ASA: III  Anesthesia Plan: General   Post-op Pain Management:    Induction: Intravenous  PONV Risk Score and Plan: 2 and Propofol infusion and Treatment may vary due to age or medical condition  Airway Management Planned: Mask  Additional Equipment:   Intra-op Plan:   Post-operative Plan:   Informed Consent: I have reviewed the patients History and Physical, chart, labs and discussed the procedure including the risks, benefits and alternatives for the proposed anesthesia with the patient or authorized representative who has indicated his/her understanding and acceptance.     Dental advisory given  Plan Discussed with: CRNA, Anesthesiologist and Surgeon  Anesthesia Plan Comments:         Anesthesia Quick Evaluation

## 2020-09-25 ENCOUNTER — Encounter: Payer: Self-pay | Admitting: Student

## 2020-09-25 ENCOUNTER — Other Ambulatory Visit: Payer: Self-pay

## 2020-09-25 ENCOUNTER — Ambulatory Visit (INDEPENDENT_AMBULATORY_CARE_PROVIDER_SITE_OTHER): Payer: Medicare Other | Admitting: Student

## 2020-09-25 VITALS — BP 108/64 | HR 73 | Ht 72.0 in | Wt 163.0 lb

## 2020-09-25 DIAGNOSIS — I4819 Other persistent atrial fibrillation: Secondary | ICD-10-CM

## 2020-09-25 DIAGNOSIS — I2510 Atherosclerotic heart disease of native coronary artery without angina pectoris: Secondary | ICD-10-CM | POA: Diagnosis not present

## 2020-09-25 LAB — BASIC METABOLIC PANEL
BUN/Creatinine Ratio: 26 — ABNORMAL HIGH (ref 10–24)
BUN: 24 mg/dL (ref 8–27)
CO2: 23 mmol/L (ref 20–29)
Calcium: 9 mg/dL (ref 8.6–10.2)
Chloride: 102 mmol/L (ref 96–106)
Creatinine, Ser: 0.92 mg/dL (ref 0.76–1.27)
Glucose: 76 mg/dL (ref 65–99)
Potassium: 4.9 mmol/L (ref 3.5–5.2)
Sodium: 139 mmol/L (ref 134–144)
eGFR: 85 mL/min/{1.73_m2} (ref >59)

## 2020-09-25 LAB — MAGNESIUM: Magnesium: 2.5 mg/dL — ABNORMAL HIGH (ref 1.6–2.3)

## 2020-09-25 MED ORDER — DOFETILIDE 500 MCG PO CAPS: 500.0000 ug | ORAL_CAPSULE | Freq: Two times a day (BID) | ORAL | 3 refills | Status: DC

## 2020-09-25 NOTE — Progress Notes (Signed)
PCP:  Antony Contras, MD Primary Cardiologist: Peter Martinique, MD Electrophysiologist: Virl Axe, MD   Jerry Barr is a 80 y.o. male seen today for Virl Axe, MD for post [facility] admission follow up.  Since last being seen in our clinic the patient reports doing OK. He did have breakthrough AF and actually had [facility] yesterday. He was in NSR for approx 2.5 weeks after his discharge. He does feel it when he goes out of rhythm. He has severe fatigue and mild SOB for approx 2-3 days that balances out even if he remains in AF.  he denies chest pain, dyspnea, PND, orthopnea, nausea, vomiting, dizziness, syncope, edema, weight gain, or early satiety.  Past Medical History:  Diagnosis Date  . Allergy   . Aortic atherosclerosis ([place])   . Atrial fibrillation ([place])   . BPH (benign prostatic hyperplasia)   . CAD (coronary artery disease)   . Cancer (HCC)    basal cell ca - face, nose and right leg  . Clotting disorder (HCC)    a fib hx - Xarelto  . Coronary artery disease    Promus drug-eluting stent to a 99% mid LAD, 40-50% narrowing in the distal LAD with mild to moderate disease in the circ and RCA b. 5/6 PCI/DESx1 to mLcx  . Dyslipidemia   . ED (erectile dysfunction)   . Hearing loss   . Hyperplastic colon polyp   . Hypoglycemia   . Orthostatic hypotension   . REM sleep behavior disorder    not tested   Past Surgical History:  Procedure Laterality Date  . BUBBLE STUDY  08/27/2020   Procedure: BUBBLE STUDY;  Surgeon: Freada Bergeron, MD;  Location: [place];  Service: Cardiovascular;;  . CARDIOVERSION N/A 04/23/2016   Procedure: CARDIOVERSION;  Surgeon: [name], MD;  Location: [place];  Service: Cardiovascular;  Laterality: N/A;  . CARDIOVERSION N/A 08/21/2020   Procedure: CARDIOVERSION;  Surgeon: Dorothy Spark, MD;  Location: [facility] ENDOSCOPY;  Service: Cardiovascular;  Laterality: N/A;  . CARDIOVERSION N/A 08/29/2020   Procedure: CARDIOVERSION;  Surgeon:  [name], MD;  Location: [facility] ENDOSCOPY;  Service: Cardiovascular;  Laterality: N/A;  . COLONOSCOPY    . CORONARY ANGIOPLASTY WITH STENT PLACEMENT    . CORONARY STENT INTERVENTION N/A 11/15/2018   Procedure: CORONARY STENT INTERVENTION;  Surgeon: Burnell Blanks, MD;  Location: [place] CV LAB;  Service: Cardiovascular;  Laterality: N/A;  . LEFT HEART CATH AND CORONARY ANGIOGRAPHY N/A 11/15/2018   Procedure: LEFT HEART CATH AND CORONARY ANGIOGRAPHY;  Surgeon: Burnell Blanks, MD;  Location: [place] CV LAB;  Service: Cardiovascular;  Laterality: N/A;  . LEFT HEART CATH AND CORONARY ANGIOGRAPHY N/A 06/25/2019   Procedure: LEFT HEART CATH AND CORONARY ANGIOGRAPHY;  Surgeon: Martinique, Peter M, MD;  Location: [place] CV LAB;  Service: Cardiovascular;  Laterality: N/A;  . POLYPECTOMY    . TEE WITHOUT CARDIOVERSION N/A 04/23/2016   Procedure: TRANSESOPHAGEAL ECHOCARDIOGRAM (TEE);  Surgeon: [name], MD;  Location: [place];  Service: Cardiovascular;  Laterality: N/A;  . TEE WITHOUT CARDIOVERSION N/A 08/27/2020   Procedure: TRANSESOPHAGEAL ECHOCARDIOGRAM (TEE);  Surgeon: Freada Bergeron, MD;  Location: [facility] ENDOSCOPY;  Service: Cardiovascular;  Laterality: N/A;  . UPPER GASTROINTESTINAL ENDOSCOPY      Current Outpatient Medications  Medication Sig Dispense Refill  . acetaminophen (TYLENOL) 500 MG tablet Take 500 mg by mouth every 6 (six) hours as needed for mild pain (or headaches).    Marland Kitchen atorvastatin (LIPITOR) 20  MG tablet TAKE 1 TABLET BY MOUTH EVERY DAY (Patient taking differently: Take 20 mg by mouth daily.) 90 tablet 3  . Cholecalciferol (VITAMIN D-3) 1000 UNITS CAPS Take 1,000 Units by mouth daily.    Marland Kitchen dofetilide (TIKOSYN) 500 MCG capsule Take 1 capsule (500 mcg total) by mouth 2 (two) times daily. 60 capsule 0  . fluticasone (FLONASE) 50 MCG/ACT nasal spray Place 1 spray into both nostrils daily as needed (seasonal allergies).     . loratadine (CLARITIN) 10  MG tablet Take 10 mg by mouth daily.    . Melatonin 3 MG TABS Take 6 mg by mouth at bedtime.    . midodrine (PROAMATINE) 5 MG tablet Take 5 mg by mouth See admin instructions. Take 5 mg by mouth in the morning and may take a second 5 mg tablet at lunchtime as needed for low bp    . Naphazoline-Pheniramine (ALLERGY EYE OP) Place 1 drop into both eyes daily as needed (allergies).    . polyethylene glycol (MIRALAX / GLYCOLAX) 17 g packet Take 17 g by mouth daily as needed for mild constipation (MIX INTO 8 OUNCES OF WATER AND DRINK).    . rivaroxaban (XARELTO) 20 MG TABS tablet Take 1 tablet (20 mg total) by mouth daily with breakfast. 30 tablet 6  . Wheat Dextrin (BENEFIBER PO) Take by mouth See admin instructions. Mix 2 teaspoonsful of powder into 6 ounces of water and drink once a day as needed for mild constipation or to add fiber to the diet     No current facility-administered medications for this visit.    Allergies  Allergen Reactions  . Penicillins Rash    Did it involve swelling of the face/tongue/throat, SOB, or low BP? No Did it involve sudden or severe rash/hives, skin peeling, or any reaction on the inside of your mouth or nose? No Did you need to seek medical attention at a hospital or doctor's office? No When did it last happen?50+ years  If all above answers are "NO", may proceed with cephalosporin use.     Social History   Socioeconomic History  . Marital status: Married    Spouse name: Sheria Lang  . Number of children: Not on file  . Years of education: Not on file  . Highest education level: Professional school degree (e.g., MD, DDS, DVM, JD)  Occupational History    Comment: retired  CE  Tobacco Use  . Smoking status: Never Smoker  . Smokeless tobacco: Never Used  Vaping Use  . Vaping Use: Never used  Substance and Sexual Activity  . Alcohol use: Yes    Alcohol/week: 4.0 standard drinks    Types: 4 Glasses of wine per week    Comment: 3oz of red wine  .  Drug use: No  . Sexual activity: Not Currently  Other Topics Concern  . Not on file  Social History Narrative   Lives with wife   Almost no caffeine   Social Determinants of Health   Financial Resource Strain: Not on file  Food Insecurity: Not on file  Transportation Needs: Not on file  Physical Activity: Not on file  Stress: Not on file  Social Connections: Not on file  Intimate Partner Violence: Not on file     Review of Systems: General: No chills, fever, night sweats or weight changes  Cardiovascular:  No chest pain, dyspnea on exertion, edema, orthopnea, palpitations, paroxysmal nocturnal dyspnea Dermatological: No rash, lesions or masses Respiratory: No cough, dyspnea Urologic: No  hematuria, dysuria Abdominal: No nausea, vomiting, diarrhea, bright red blood per rectum, melena, or hematemesis Neurologic: No visual changes, weakness, changes in mental status All other systems reviewed and are otherwise negative except as noted above.  Physical Exam: Vitals:   09/25/20 1110  BP: 108/64  Pulse: 73  SpO2: 99%  Weight: 163 lb (73.9 kg)  Height: 6' (1.829 m)    GEN- The patient is well appearing, alert and oriented x 3 today.   HEENT: normocephalic, atraumatic; sclera clear, conjunctiva pink; hearing intact; oropharynx clear; neck supple, no JVP Lymph- no cervical lymphadenopathy Lungs- Clear to ausculation bilaterally, normal work of breathing.  No wheezes, rales, rhonchi Heart- Regular rate and rhythm, no murmurs, rubs or gallops, PMI not laterally displaced GI- soft, non-tender, non-distended, bowel sounds present, no hepatosplenomegaly Extremities- no clubbing, cyanosis, or edema; DP/PT/radial pulses 2+ bilaterally MS- no significant deformity or atrophy Skin- warm and dry, no rash or lesion Psych- euthymic mood, full affect Neuro- strength and sensation are intact  EKG is ordered. Personal review of EKG from today shows NSR at 73 bpm. QTc stable at 458 ms.    Additional studies reviewed include: Previous EP and AF notes  Assessment and Plan:  1. Paroxsymal atrial fibrillation EKG today shows NSR with stable QTc as above.  Continue Tikosyn 500 mcg BID.  BMET and Mg today.  Will plan on continuing Tikosyn for now. He would like to be considered for ablation, especially if breakthrough recurs. I will schedule him for 4-6 week follow up with Dr. Caryl Comes to further discuss. If has breakthrough within that time, may be reasonable to consider closer follow up with one of the physicians who performs AF ablations.  He knows to reach out to the AF clinic for his best resource on getting in quickly for a [facility] if needed.  TEE 08/27/2020 with LVEF 55-60%, LA moderately dilated (3.75 cm by TTE 12/2019)  Shirley Friar, PA-C  09/25/20 11:23 AM

## 2020-09-25 NOTE — Patient Instructions (Signed)
Medication Instructions:  Your physician recommends that you continue on your current medications as directed. Please refer to the Current Medication list given to you today.  *If you need a refill on your cardiac medications before your next appointment, please call your pharmacy*   Lab Work: None If you have labs (blood work) drawn today and your tests are completely normal, you will receive your results only by: Marland Kitchen MyChart Message (if you have MyChart) OR . A paper copy in the mail If you have any lab test that is abnormal or we need to change your treatment, we will call you to review the results.  Follow-Up: At [facility], you and your health needs are our priority.  As part of our continuing mission to provide you with exceptional heart care, we have created designated Provider Care Teams.  These Care Teams include your primary Cardiologist (physician) and Advanced Practice Providers (APPs -  Physician Assistants and Nurse Practitioners) who all work together to provide you with the care you need, when you need it.   Your next appointment:   10/23/2020  The format for your next appointment:   Virtual Visit   Provider:   Virl Axe, MD

## 2020-09-25 NOTE — Anesthesia Postprocedure Evaluation (Signed)
Anesthesia Post Note  Patient: Jerry Barr  Procedure(s) Performed: CARDIOVERSION (N/A )     Patient location during evaluation: Endoscopy Anesthesia Type: General Level of consciousness: awake and alert Pain management: pain level controlled Vital Signs Assessment: post-procedure vital signs reviewed and stable Respiratory status: spontaneous breathing, nonlabored ventilation, respiratory function stable and patient connected to nasal cannula oxygen Cardiovascular status: blood pressure returned to baseline and stable Postop Assessment: no apparent nausea or vomiting Anesthetic complications: no   No complications documented.  Last Vitals:  Vitals:   09/24/20 1027 09/24/20 1037  BP: 137/88 (!) 156/80  Pulse: 72 74  Resp: (!) 21 (!) 21  Temp:    SpO2: 100% 100%    Last Pain:  Vitals:   09/24/20 1037  TempSrc:   PainSc: 0-No pain                 [name]

## 2020-09-26 ENCOUNTER — Encounter (HOSPITAL_COMMUNITY): Payer: Self-pay | Admitting: Cardiology

## 2020-09-29 ENCOUNTER — Telehealth (HOSPITAL_COMMUNITY): Payer: Self-pay | Admitting: *Deleted

## 2020-09-29 NOTE — Telephone Encounter (Signed)
Patient back in AF as of Sunday morning HR around 100. BP 126/93 sitting 81/58 standing.  Discussed with Adline Peals PA next step is discuss ablation - pt in agreement. Appt made for 3/30. Pt will call if issues arise prior to appt.

## 2020-10-08 ENCOUNTER — Other Ambulatory Visit: Payer: Self-pay

## 2020-10-08 ENCOUNTER — Encounter: Payer: Self-pay | Admitting: *Deleted

## 2020-10-08 ENCOUNTER — Encounter: Payer: Self-pay | Admitting: Internal Medicine

## 2020-10-08 ENCOUNTER — Ambulatory Visit (INDEPENDENT_AMBULATORY_CARE_PROVIDER_SITE_OTHER): Payer: Medicare Other | Admitting: Internal Medicine

## 2020-10-08 VITALS — BP 122/84 | HR 118 | Ht 72.0 in | Wt 156.2 lb

## 2020-10-08 DIAGNOSIS — I4891 Unspecified atrial fibrillation: Secondary | ICD-10-CM

## 2020-10-08 DIAGNOSIS — D6869 Other thrombophilia: Secondary | ICD-10-CM

## 2020-10-08 DIAGNOSIS — I4819 Other persistent atrial fibrillation: Secondary | ICD-10-CM | POA: Diagnosis not present

## 2020-10-08 DIAGNOSIS — I10 Essential (primary) hypertension: Secondary | ICD-10-CM

## 2020-10-08 DIAGNOSIS — I2510 Atherosclerotic heart disease of native coronary artery without angina pectoris: Secondary | ICD-10-CM | POA: Diagnosis not present

## 2020-10-08 MED ORDER — DOFETILIDE 500 MCG PO CAPS: 500.0000 ug | ORAL_CAPSULE | Freq: Two times a day (BID) | ORAL | 3 refills | Status: DC

## 2020-10-08 NOTE — Patient Instructions (Addendum)
Medication Instructions:  Your physician recommends that you continue on your current medications as directed. Please refer to the Current Medication list given to you today.  Labwork: None ordered.  Testing/Procedures: Your physician has requested that you have cardiac CT. Cardiac computed tomography (CT) is a painless test that uses an x-ray machine to take clear, detailed pictures of your heart. For further information please visit www.cardiosmart.org. Please follow instruction sheet as given.  Your physician has recommended that you have an ablation. Catheter ablation is a medical procedure used to treat some cardiac arrhythmias (irregular heartbeats). During catheter ablation, a long, thin, flexible tube is put into a blood vessel in your groin (upper thigh), or neck. This tube is called an ablation catheter. It is then guided to your heart through the blood vessel. Radio frequency waves destroy small areas of heart tissue where abnormal heartbeats may cause an arrhythmia to start. Please see the instruction sheet given to you today.    Any Other Special Instructions Will Be Listed Below (If Applicable).  If you need a refill on your cardiac medications before your next appointment, please call your pharmacy.   . Cardiac Ablation Cardiac ablation is a procedure to destroy (ablate) some heart tissue that is sending bad signals. These bad signals cause problems in heart rhythm. The heart has many areas that make these signals. If there are problems in these areas, they can make the heart beat in a way that is not normal. Destroying some tissues can help make the heart rhythm normal. Tell your doctor about:  Any allergies you have.  All medicines you are taking. These include vitamins, herbs, eye drops, creams, and over-the-counter medicines.  Any problems you or family members have had with medicines that make you fall asleep (anesthetics).  Any blood disorders you have.  Any  surgeries you have had.  Any medical conditions you have, such as kidney failure.  Whether you are pregnant or may be pregnant. What are the risks? This is a safe procedure. But problems may occur, including:  Infection.  Bruising and bleeding.  Bleeding into the chest.  Stroke or blood clots.  Damage to nearby areas of your body.  Allergies to medicines or dyes.  The need for a pacemaker if the normal system is damaged.  Failure of the procedure to treat the problem. What happens before the procedure? Medicines Ask your doctor about:  Changing or stopping your normal medicines. This is important.  Taking aspirin and ibuprofen. Do not take these medicines unless your doctor tells you to take them.  Taking other medicines, vitamins, herbs, and supplements. General instructions  Follow instructions from your doctor about what you cannot eat or drink.  Plan to have someone take you home from the hospital or clinic.  If you will be going home right after the procedure, plan to have someone with you for 24 hours.  Ask your doctor what steps will be taken to prevent infection. What happens during the procedure?  An IV tube will be put into one of your veins.  You will be given a medicine to help you relax.  The skin on your neck or groin will be numbed.  A cut (incision) will be made in your neck or groin. A needle will be put through your cut and into a large vein.  A tube (catheter) will be put into the needle. The tube will be moved to your heart.  Dye may be put through the tube. This helps   your doctor see your heart.  Small devices (electrodes) on the tube will send out signals.  A type of energy will be used to destroy some heart tissue.  The tube will be taken out.  Pressure will be held on your cut. This helps stop bleeding.  A bandage will be put over your cut. The exact procedure may vary among doctors and hospitals.   What happens after the  procedure?  You will be watched until you leave the hospital or clinic. This includes checking your heart rate, breathing rate, oxygen, and blood pressure.  Your cut will be watched for bleeding. You will need to lie still for a few hours.  Do not drive for 24 hours or as long as your doctor tells you. Summary  Cardiac ablation is a procedure to destroy some heart tissue. This is done to treat heart rhythm problems.  Tell your doctor about any medical conditions you may have. Tell him or her about all medicines you are taking to treat them.  This is a safe procedure. But problems may occur. These include infection, bruising, bleeding, and damage to nearby areas of your body.  Follow what your doctor tells you about food and drink. You may also be told to change or stop some of your medicines.  After the procedure, do not drive for 24 hours or as long as your doctor tells you. This information is not intended to replace advice given to you by your health care provider. Make sure you discuss any questions you have with your health care provider. Document Revised: 05/31/2019 Document Reviewed: 05/31/2019 Elsevier Patient Education  2021 Elsevier Inc.        

## 2020-10-08 NOTE — Progress Notes (Signed)
PCP: Antony Contras, MD Primary Cardiologist: Dr Martinique Primary EP: Dr Jerry Barr is a 80 y.o. male who presents today for routine electrophysiology followup.  Since recent tikosyn load, the patient reports doing very well.  Unfortunately, he continues to have afib.  + fatigue and decreased exercise tolerance.  Today, he denies symptoms of palpitations, chest pain, shortness of breath,  lower extremity edema, dizziness, presyncope, or syncope.  The patient is otherwise without complaint today.   Past Medical History:  Diagnosis Date  . Allergy   . Aortic atherosclerosis ([place])   . Atrial fibrillation ([place])   . BPH (benign prostatic hyperplasia)   . CAD (coronary artery disease)   . Cancer (HCC)    basal cell ca - face, nose and right leg  . Clotting disorder (HCC)    a fib hx - Xarelto  . Coronary artery disease    Promus drug-eluting stent to a 99% mid LAD, 40-50% narrowing in the distal LAD with mild to moderate disease in the circ and RCA b. 5/6 PCI/DESx1 to mLcx  . Dyslipidemia   . ED (erectile dysfunction)   . Hearing loss   . Hyperplastic colon polyp   . Hypoglycemia   . Orthostatic hypotension   . REM sleep behavior disorder    not tested   Past Surgical History:  Procedure Laterality Date  . BUBBLE STUDY  08/27/2020   Procedure: BUBBLE STUDY;  Surgeon: Freada Bergeron, MD;  Location: [place];  Service: Cardiovascular;;  . CARDIOVERSION N/A 04/23/2016   Procedure: CARDIOVERSION;  Surgeon: [name], MD;  Location: [place] [facility];  Service: Cardiovascular;  Laterality: N/A;  . CARDIOVERSION N/A 08/21/2020   Procedure: CARDIOVERSION;  Surgeon: Dorothy Spark, MD;  Location: [facility] ENDOSCOPY;  Service: Cardiovascular;  Laterality: N/A;  . CARDIOVERSION N/A 08/29/2020   Procedure: CARDIOVERSION;  Surgeon: [name], MD;  Location: [facility] ENDOSCOPY;  Service: Cardiovascular;  Laterality: N/A;  . CARDIOVERSION N/A 09/24/2020   Procedure: CARDIOVERSION;   Surgeon: Freada Bergeron, MD;  Location: [facility] ENDOSCOPY;  Service: Cardiovascular;  Laterality: N/A;  . COLONOSCOPY    . CORONARY ANGIOPLASTY WITH STENT PLACEMENT    . CORONARY STENT INTERVENTION N/A 11/15/2018   Procedure: CORONARY STENT INTERVENTION;  Surgeon: Burnell Blanks, MD;  Location: [place] CV LAB;  Service: Cardiovascular;  Laterality: N/A;  . LEFT HEART CATH AND CORONARY ANGIOGRAPHY N/A 11/15/2018   Procedure: LEFT HEART CATH AND CORONARY ANGIOGRAPHY;  Surgeon: Burnell Blanks, MD;  Location: [place] CV LAB;  Service: Cardiovascular;  Laterality: N/A;  . LEFT HEART CATH AND CORONARY ANGIOGRAPHY N/A 06/25/2019   Procedure: LEFT HEART CATH AND CORONARY ANGIOGRAPHY;  Surgeon: Martinique, Peter M, MD;  Location: [place] CV LAB;  Service: Cardiovascular;  Laterality: N/A;  . POLYPECTOMY    . TEE WITHOUT CARDIOVERSION N/A 04/23/2016   Procedure: TRANSESOPHAGEAL ECHOCARDIOGRAM (TEE);  Surgeon: [name], MD;  Location: [place];  Service: Cardiovascular;  Laterality: N/A;  . TEE WITHOUT CARDIOVERSION N/A 08/27/2020   Procedure: TRANSESOPHAGEAL ECHOCARDIOGRAM (TEE);  Surgeon: Freada Bergeron, MD;  Location: [facility] ENDOSCOPY;  Service: Cardiovascular;  Laterality: N/A;  . UPPER GASTROINTESTINAL ENDOSCOPY      ROS- all systems are reviewed and negatives except as per HPI above  Current Outpatient Medications  Medication Sig Dispense Refill  . acetaminophen (TYLENOL) 500 MG tablet Take 500 mg by mouth every 6 (six) hours as needed for mild pain (or headaches).    Marland Kitchen atorvastatin (  LIPITOR) 20 MG tablet TAKE 1 TABLET BY MOUTH EVERY DAY (Patient taking differently: Take 20 mg by mouth daily.) 90 tablet 3  . Cholecalciferol (VITAMIN D-3) 1000 UNITS CAPS Take 1,000 Units by mouth daily.    Marland Kitchen dofetilide (TIKOSYN) 500 MCG capsule Take 1 capsule (500 mcg total) by mouth 2 (two) times daily. 60 capsule 3  . fluticasone (FLONASE) 50 MCG/ACT nasal spray Place 1 spray  into both nostrils daily as needed (seasonal allergies).     . loratadine (CLARITIN) 10 MG tablet Take 10 mg by mouth daily.    . Melatonin 3 MG TABS Take 6 mg by mouth at bedtime.    . midodrine (PROAMATINE) 5 MG tablet Take 5 mg by mouth See admin instructions. Take 5 mg by mouth in the morning and may take a second 5 mg tablet at lunchtime as needed for low bp    . Naphazoline-Pheniramine (ALLERGY EYE OP) Place 1 drop into both eyes daily as needed (allergies).    . polyethylene glycol (MIRALAX / GLYCOLAX) 17 g packet Take 17 g by mouth daily as needed for mild constipation (MIX INTO 8 OUNCES OF WATER AND DRINK).    . rivaroxaban (XARELTO) 20 MG TABS tablet Take 1 tablet (20 mg total) by mouth daily with breakfast. 30 tablet 6  . Wheat Dextrin (BENEFIBER PO) Take by mouth See admin instructions. Mix 2 teaspoonsful of powder into 6 ounces of water and drink once a day as needed for mild constipation or to add fiber to the diet     No current facility-administered medications for this visit.    Physical Exam: Vitals:   10/08/20 1027  BP: 122/84  Pulse: (!) 118  SpO2: 96%  Weight: 156 lb 3.2 oz (70.9 kg)  Height: 6' (1.829 m)    GEN- The patient is well appearing, alert and oriented x 3 today.   Head- normocephalic, atraumatic Eyes-  Sclera clear, conjunctiva pink Ears- hearing intact Oropharynx- clear Lungs- Clear to ausculation bilaterally, normal work of breathing Heart- irregular rate and rhythm, no murmurs, rubs or gallops, PMI not laterally displaced GI- soft, NT, ND, + BS Extremities- no clubbing, cyanosis, or edema  Wt Readings from Last 3 Encounters:  10/08/20 156 lb 3.2 oz (70.9 kg)  09/25/20 163 lb (73.9 kg)  09/24/20 157 lb 9.6 oz (71.5 kg)    EKG tracing ordered today is personally reviewed and shows afib with RVR  Assessment and Plan:  1. Persistent atrial fibrillation The patient has symptomatic, recurrent  atrial fibrillation.  His afib has progressed to  persistent. he has failed medical therapy with amiodarone and tikosyn. Chads2vasc score is 4.  he is anticoagulated with xarelto . Therapeutic strategies for afib including medicine and ablation were discussed in detail with the patient today. Risk, benefits, and alternatives to EP study and radiofrequency ablation for afib were also discussed in detail today. These risks include but are not limited to stroke, bleeding, vascular damage, tamponade, perforation, damage to the esophagus, lungs, and other structures, pulmonary vein stenosis, worsening renal function, and death. The patient understands these risk and wishes to proceed.  We will therefore proceed with catheter ablation at the next available time.  Carto, ICE, anesthesia are requested for the procedure.  Will also obtain cardiac CT prior to the procedure to exclude LAA thrombus and further evaluate atrial anatomy.   2. HTN Stable No change required today  3. CAD No ischemic symptoms Stable No change required today   Risks,  benefits and potential toxicities for medications prescribed and/or refilled reviewed with patient today.   Thompson Grayer MD, [facility] 10/08/2020 10:47 AM

## 2020-10-09 ENCOUNTER — Telehealth: Payer: Self-pay | Admitting: Internal Medicine

## 2020-10-09 MED ORDER — DOFETILIDE 500 MCG PO CAPS: 500.0000 ug | ORAL_CAPSULE | Freq: Two times a day (BID) | ORAL | 3 refills | Status: DC

## 2020-10-09 NOTE — Telephone Encounter (Signed)
   *  STAT* If patient is at the pharmacy, call can be transferred to refill team.   1. Which medications need to be refilled? (please list name of each medication and dose if known)   dofetilide (TIKOSYN) 500 MCG capsule    2. Which pharmacy/location (including street and city if local pharmacy) is medication to be sent to? [facility] [address], [place]  3. Do they need a 30 day or 90 day supply? 90 days   Pt said the meds should be send to [facility]

## 2020-10-09 NOTE — Telephone Encounter (Signed)
Pt's medication was sent to pt's pharmacy as requested. Confirmation received.  °

## 2020-10-15 DIAGNOSIS — H5203 Hypermetropia, bilateral: Secondary | ICD-10-CM | POA: Diagnosis not present

## 2020-10-15 DIAGNOSIS — H2513 Age-related nuclear cataract, bilateral: Secondary | ICD-10-CM | POA: Diagnosis not present

## 2020-10-23 ENCOUNTER — Other Ambulatory Visit: Payer: Self-pay

## 2020-10-23 ENCOUNTER — Telehealth (INDEPENDENT_AMBULATORY_CARE_PROVIDER_SITE_OTHER): Payer: Medicare Other | Admitting: Internal Medicine

## 2020-10-23 VITALS — BP 91/64 | HR 99 | Ht 72.0 in | Wt 152.0 lb

## 2020-10-23 DIAGNOSIS — I2510 Atherosclerotic heart disease of native coronary artery without angina pectoris: Secondary | ICD-10-CM

## 2020-10-23 DIAGNOSIS — I4819 Other persistent atrial fibrillation: Secondary | ICD-10-CM

## 2020-10-23 DIAGNOSIS — R002 Palpitations: Secondary | ICD-10-CM

## 2020-10-23 DIAGNOSIS — I951 Orthostatic hypotension: Secondary | ICD-10-CM

## 2020-10-23 NOTE — Patient Instructions (Addendum)
Medication Instructions:  Your physician recommends that you continue on your current medications as directed. Please refer to the Current Medication list given to you today.  *If you need a refill on your cardiac medications before your next appointment, please call your pharmacy*   Lab Work: None ordered.  If you have labs (blood work) drawn today and your tests are completely normal, you will receive your results only by: Marland Kitchen MyChart Message (if you have MyChart) OR . A paper copy in the mail If you have any lab test that is abnormal or we need to change your treatment, we will call you to review the results.   Testing/Procedures: None ordered.    Follow-Up: At [facility], you and your health needs are our priority.  As part of our continuing mission to provide you with exceptional heart care, we have created designated Provider Care Teams.  These Care Teams include your primary Cardiologist (physician) and Advanced Practice Providers (APPs -  Physician Assistants and Nurse Practitioners) who all work together to provide you with the care you need, when you need it.  We recommend signing up for the patient portal called "MyChart".  Sign up information is provided on this After Visit Summary.  MyChart is used to connect with patients for Virtual Visits (Telemedicine).  Patients are able to view lab/test results, encounter notes, upcoming appointments, etc.  Non-urgent messages can be sent to your provider as well.   To learn more about what you can do with MyChart, go to [email or web address].    Your next appointment:    Tuesday, 02/24/2021 at 2pm

## 2020-10-23 NOTE — Progress Notes (Signed)
Electrophysiology TeleHealth Note   Due to national recommendations of social distancing due to COVID 19, an audio/video telehealth visit is felt to be most appropriate for this patient at this time.  See MyChart message from today for the patient's consent to telehealth for [facility].   Date:  10/23/2020   ID:  Jerry Barr, DOB 1940-12-07, MRN [id number]  Location: patient's home  Provider location: [address] [place]  Evaluation Performed: Follow-up visit  PCP:  Antony Contras, MD  Cardiologist:   PJ Electrophysiologist:  SK   Chief Complaint: afib and orthostasis   History of Present Illness:    Jerry Barr is a 80 y.o. male who presents via audio/video conferencing for a telehealth visit today.  Since last being seen in our clinic for symptomatic atrial fibrillation-persistent recently having failed dofetilide and orthostatic hypotension,  the patient reports the recurrence of atrial fibrillation is assoc with aggravation of his orthostasis.    Anticoagulation with apixaban without bleeding  Going to Grandson's graduation @ [employer]   DATE TEST EF   10/17 Echo 50-55%%   12/18 Echo 45-50%   12/20 LHC  LAD Stents patent Cx-stent patent; RCA patent   Date Cr K TSH LFTs Hgb  5/21 1.26 4.6 1.19 20 13.9          Thromboembolic risk factors ( age -44, HTN-1, Vasc disease -1) for a CHADSVASc Score of>=4     The patient denies symptoms of fevers, chills, cough, or new SOB worrisome for COVID 19.    Past Medical History:  Diagnosis Date  . Allergy   . Aortic atherosclerosis ([place])   . Atrial fibrillation ([place])   . BPH (benign prostatic hyperplasia)   . CAD (coronary artery disease)   . Cancer (HCC)    basal cell ca - face, nose and right leg  . Clotting disorder (HCC)    a fib hx - Xarelto  . Coronary artery disease    Promus drug-eluting stent to a 99% mid LAD, 40-50% narrowing in the distal LAD with mild to moderate  disease in the circ and RCA b. 5/6 PCI/DESx1 to mLcx  . Dyslipidemia   . ED (erectile dysfunction)   . Hearing loss   . Hyperplastic colon polyp   . Hypoglycemia   . Orthostatic hypotension   . REM sleep behavior disorder    not tested    Past Surgical History:  Procedure Laterality Date  . BUBBLE STUDY  08/27/2020   Procedure: BUBBLE STUDY;  Surgeon: Freada Bergeron, MD;  Location: [place];  Service: Cardiovascular;;  . CARDIOVERSION N/A 04/23/2016   Procedure: CARDIOVERSION;  Surgeon: [name], MD;  Location: [place];  Service: Cardiovascular;  Laterality: N/A;  . CARDIOVERSION N/A 08/21/2020   Procedure: CARDIOVERSION;  Surgeon: Dorothy Spark, MD;  Location: [facility] ENDOSCOPY;  Service: Cardiovascular;  Laterality: N/A;  . CARDIOVERSION N/A 08/29/2020   Procedure: CARDIOVERSION;  Surgeon: Sanda Peni Rupard, MD;  Location: [facility] ENDOSCOPY;  Service: Cardiovascular;  Laterality: N/A;  . CARDIOVERSION N/A 09/24/2020   Procedure: CARDIOVERSION;  Surgeon: Freada Bergeron, MD;  Location: [facility] ENDOSCOPY;  Service: Cardiovascular;  Laterality: N/A;  . COLONOSCOPY    . CORONARY ANGIOPLASTY WITH STENT PLACEMENT    . CORONARY STENT INTERVENTION N/A 11/15/2018   Procedure: CORONARY STENT INTERVENTION;  Surgeon: Burnell Blanks, MD;  Location: [place] CV LAB;  Service: Cardiovascular;  Laterality: N/A;  . LEFT HEART CATH AND CORONARY ANGIOGRAPHY N/A 11/15/2018  Procedure: LEFT HEART CATH AND CORONARY ANGIOGRAPHY;  Surgeon: Burnell Blanks, MD;  Location: [place] CV LAB;  Service: Cardiovascular;  Laterality: N/A;  . LEFT HEART CATH AND CORONARY ANGIOGRAPHY N/A 06/25/2019   Procedure: LEFT HEART CATH AND CORONARY ANGIOGRAPHY;  Surgeon: Martinique, Peter M, MD;  Location: [place] CV LAB;  Service: Cardiovascular;  Laterality: N/A;  . POLYPECTOMY    . TEE WITHOUT CARDIOVERSION N/A 04/23/2016   Procedure: TRANSESOPHAGEAL ECHOCARDIOGRAM (TEE);  Surgeon: [name], MD;  Location: [place];  Service: Cardiovascular;  Laterality: N/A;  . TEE WITHOUT CARDIOVERSION N/A 08/27/2020   Procedure: TRANSESOPHAGEAL ECHOCARDIOGRAM (TEE);  Surgeon: Freada Bergeron, MD;  Location: [facility] ENDOSCOPY;  Service: Cardiovascular;  Laterality: N/A;  . UPPER GASTROINTESTINAL ENDOSCOPY      Current Outpatient Medications  Medication Sig Dispense Refill  . acetaminophen (TYLENOL) 500 MG tablet Take 500 mg by mouth every 6 (six) hours as needed for mild pain (or headaches).    Marland Kitchen atorvastatin (LIPITOR) 20 MG tablet TAKE 1 TABLET BY MOUTH EVERY DAY (Patient taking differently: Take 20 mg by mouth daily.) 90 tablet 3  . Cholecalciferol (VITAMIN D-3) 1000 UNITS CAPS Take 1,000 Units by mouth daily.    Marland Kitchen dofetilide (TIKOSYN) 500 MCG capsule Take 1 capsule (500 mcg total) by mouth 2 (two) times daily. 180 capsule 3  . fluticasone (FLONASE) 50 MCG/ACT nasal spray Place 1 spray into both nostrils daily as needed (seasonal allergies).     . loratadine (CLARITIN) 10 MG tablet Take 10 mg by mouth daily.    . Melatonin 3 MG TABS Take 6 mg by mouth at bedtime.    . midodrine (PROAMATINE) 5 MG tablet Take 5 mg by mouth See admin instructions. Take 5 mg by mouth in the morning and may take a second 5 mg tablet at lunchtime as needed for low bp    . Naphazoline-Pheniramine (ALLERGY EYE OP) Place 1 drop into both eyes daily as needed (allergies).    . polyethylene glycol (MIRALAX / GLYCOLAX) 17 g packet Take 17 g by mouth daily as needed for mild constipation (MIX INTO 8 OUNCES OF WATER AND DRINK).    . rivaroxaban (XARELTO) 20 MG TABS tablet Take 1 tablet (20 mg total) by mouth daily with breakfast. 30 tablet 6  . Wheat Dextrin (BENEFIBER PO) Take by mouth See admin instructions. Mix 2 teaspoonsful of powder into 6 ounces of water and drink once a day as needed for mild constipation or to add fiber to the diet     No current facility-administered medications for this visit.     Allergies:   Penicillins   Social History:  The patient  reports that he has never smoked. He has never used smokeless tobacco. He reports current alcohol use of about 4.0 standard drinks of alcohol per week. He reports that he does not use drugs.   Family History:  The patient's   family history includes Bone cancer in his paternal grandfather; Breast cancer in his mother; CAD in his father; Cancer in his brother; Colon cancer in his brother and mother; Esophageal cancer in his brother; Heart attack in his paternal grandmother; Leukemia in his paternal uncle; Liver cancer in his brother.   ROS:  Please see the history of present illness.   All other systems are personally reviewed and negative.    Exam:    Vital Signs:  BP 91/64 (Patient Position: Standing)   Pulse 99   Ht 6' (1.829  m)   Wt 152 lb (68.9 kg)   BMI 20.61 kg/m         Labs/Other Tests and Data Reviewed:    Recent Labs: 08/25/2020: ALT 14; TSH 2.124 09/16/2020: Hemoglobin 14.9; Platelets 231 09/25/2020: BUN 24; Creatinine, Ser 0.92; Magnesium 2.5; Potassium 4.9; Sodium 139   Wt Readings from Last 3 Encounters:  10/23/20 152 lb (68.9 kg)  10/08/20 156 lb 3.2 oz (70.9 kg)  09/25/20 163 lb (73.9 kg)     Other studies personally reviewed:      ASSESSMENT & PLAN:   Orthostatic intolerance  Coronary artery disease with prior stenting  Atrial fibrillation  Palpitations  GI bug  For atrial fibrillation ablation in early May.  He has had significant symptoms and hopefully this will be successful and not able to mitigate this as he has few antiarrhythmic drug options.  Orthostasis is been aggravated by the onset of atrial fibrillation but also by his recent GI bug  Orthostasis .Marland Kitchen  We have discussed augmented oral rehydration during this gastroenteritis.  Reviewed the procedure   COVID 19 screen The patient denies symptoms of COVID 19 at this time.  The importance of social distancing was discussed  today.  Follow-up:  3-55m   Current medicines are reviewed at length with the patient today.   The patient does not have concerns regarding his medicines.  The following changes were made today:  none  Labs/ tests ordered today include:   No orders of the defined types were placed in this encounter.   Future tests ( post COVID )   s  Patient Risk:  after full review of this patients clinical status, I feel that they are at moderate risk at this time.  Today, I have spent 18 minutes with the patient with telehealth technology discussing the above.  Signed, Virl Axe, MD  10/23/2020 3:28 PM     [facility] [address] [place] [phone] (office) [phone] (fax)

## 2020-10-29 ENCOUNTER — Other Ambulatory Visit: Payer: Medicare Other | Admitting: *Deleted

## 2020-10-29 ENCOUNTER — Other Ambulatory Visit: Payer: Self-pay

## 2020-10-29 DIAGNOSIS — I4891 Unspecified atrial fibrillation: Secondary | ICD-10-CM | POA: Diagnosis not present

## 2020-10-29 DIAGNOSIS — I4819 Other persistent atrial fibrillation: Secondary | ICD-10-CM

## 2020-10-29 LAB — CBC WITH DIFFERENTIAL/PLATELET
Basophils Absolute: 0 10*3/uL (ref 0.0–0.2)
Basos: 1 %
EOS (ABSOLUTE): 0.2 10*3/uL (ref 0.0–0.4)
Eos: 3 %
Hematocrit: 44.1 % (ref 37.5–51.0)
Hemoglobin: 14.5 g/dL (ref 13.0–17.7)
Immature Grans (Abs): 0 10*3/uL (ref 0.0–0.1)
Immature Granulocytes: 0 %
Lymphocytes Absolute: 1.6 10*3/uL (ref 0.7–3.1)
Lymphs: 26 %
MCH: 30.1 pg (ref 26.6–33.0)
MCHC: 32.9 g/dL (ref 31.5–35.7)
MCV: 92 fL (ref 79–97)
Monocytes Absolute: 0.6 10*3/uL (ref 0.1–0.9)
Monocytes: 10 %
Neutrophils Absolute: 3.8 10*3/uL (ref 1.4–7.0)
Neutrophils: 60 %
Platelets: 227 10*3/uL (ref 150–450)
RBC: 4.82 x10E6/uL (ref 4.14–5.80)
RDW: 13.3 % (ref 11.6–15.4)
WBC: 6.3 10*3/uL (ref 3.4–10.8)

## 2020-10-29 LAB — BASIC METABOLIC PANEL
BUN/Creatinine Ratio: 21 (ref 10–24)
BUN: 20 mg/dL (ref 8–27)
CO2: 24 mmol/L (ref 20–29)
Calcium: 9.2 mg/dL (ref 8.6–10.2)
Chloride: 103 mmol/L (ref 96–106)
Creatinine, Ser: 0.96 mg/dL (ref 0.76–1.27)
Glucose: 69 mg/dL (ref 65–99)
Potassium: 4.9 mmol/L (ref 3.5–5.2)
Sodium: 142 mmol/L (ref 134–144)
eGFR: 80 mL/min/{1.73_m2} (ref >59)

## 2020-11-10 ENCOUNTER — Telehealth (HOSPITAL_COMMUNITY): Payer: Self-pay | Admitting: Emergency Medicine

## 2020-11-10 NOTE — Telephone Encounter (Signed)
Attempted to call patient regarding upcoming cardiac CT appointment. °Left message on voicemail with name and callback number °Sherman Lipuma RN Navigator Cardiac Imaging °[facility] °[phone] Office °[phone] Cell ° °

## 2020-11-10 NOTE — Telephone Encounter (Signed)
Reaching out to patient to offer assistance regarding upcoming cardiac imaging study; pt verbalizes understanding of appt date/time, parking situation and where to check in, pre-test NPO status and medications ordered, and verified current allergies; name and call back number provided for further questions should they arise Osmara Drummonds RN Navigator Cardiac Imaging [facility] [phone] office [phone] cell 

## 2020-11-11 ENCOUNTER — Ambulatory Visit (HOSPITAL_COMMUNITY)
Admission: RE | Admit: 2020-11-11 | Discharge: 2020-11-11 | Disposition: A | Discharge status: Home / Self Care | Payer: Medicare Other | Source: Ambulatory Visit | Attending: Internal Medicine | Admitting: Internal Medicine

## 2020-11-11 ENCOUNTER — Other Ambulatory Visit: Payer: Self-pay

## 2020-11-11 DIAGNOSIS — I4891 Unspecified atrial fibrillation: Secondary | ICD-10-CM

## 2020-11-11 MED ORDER — IOHEXOL 350 MG/ML SOLN
80.0000 mL | Freq: Once | INTRAVENOUS | Status: AC | PRN
  Administered 2020-11-11: 80 mL via INTRAVENOUS

## 2020-11-17 ENCOUNTER — Other Ambulatory Visit (HOSPITAL_COMMUNITY)
Admission: RE | Admit: 2020-11-17 | Discharge: 2020-11-17 | Disposition: A | Discharge status: Home / Self Care | Payer: Medicare Other | Source: Ambulatory Visit | Attending: Internal Medicine | Admitting: Internal Medicine

## 2020-11-17 DIAGNOSIS — Z20822 Contact with and (suspected) exposure to covid-19: Secondary | ICD-10-CM | POA: Insufficient documentation

## 2020-11-17 DIAGNOSIS — Z01812 Encounter for preprocedural laboratory examination: Secondary | ICD-10-CM | POA: Diagnosis not present

## 2020-11-17 LAB — SARS CORONAVIRUS 2 (TAT 6-24 HRS): SARS Coronavirus 2: NEGATIVE

## 2020-11-17 NOTE — Pre-Procedure Instructions (Addendum)
Instructions  on the following items: Arrival time 1130 Nothing to eat or drink after midnight No meds AM of procedure Responsible person to drive you home and stay with you for 24 hrs  Have you missed any doses of anti-coagulant Xarelto- hasn't missed any doses

## 2020-11-18 ENCOUNTER — Encounter (HOSPITAL_COMMUNITY): Payer: Self-pay | Admitting: Internal Medicine

## 2020-11-18 ENCOUNTER — Encounter (HOSPITAL_COMMUNITY)
Admission: RE | Disposition: A | Discharge status: Home / Self Care | Payer: Self-pay | Source: Home / Self Care | Attending: Internal Medicine

## 2020-11-18 ENCOUNTER — Ambulatory Visit (HOSPITAL_COMMUNITY): Payer: Medicare Other | Admitting: Certified Registered Nurse Anesthetist

## 2020-11-18 ENCOUNTER — Ambulatory Visit (HOSPITAL_COMMUNITY)
Admission: RE | Admit: 2020-11-18 | Discharge: 2020-11-18 | Disposition: A | Discharge status: Home / Self Care | Payer: Medicare Other | Attending: Internal Medicine | Admitting: Internal Medicine

## 2020-11-18 DIAGNOSIS — H9190 Unspecified hearing loss, unspecified ear: Secondary | ICD-10-CM | POA: Insufficient documentation

## 2020-11-18 DIAGNOSIS — Z79899 Other long term (current) drug therapy: Secondary | ICD-10-CM | POA: Insufficient documentation

## 2020-11-18 DIAGNOSIS — I4819 Other persistent atrial fibrillation: Secondary | ICD-10-CM | POA: Diagnosis not present

## 2020-11-18 DIAGNOSIS — Z955 Presence of coronary angioplasty implant and graft: Secondary | ICD-10-CM | POA: Insufficient documentation

## 2020-11-18 DIAGNOSIS — Z7901 Long term (current) use of anticoagulants: Secondary | ICD-10-CM | POA: Insufficient documentation

## 2020-11-18 DIAGNOSIS — Z85828 Personal history of other malignant neoplasm of skin: Secondary | ICD-10-CM | POA: Insufficient documentation

## 2020-11-18 DIAGNOSIS — I2510 Atherosclerotic heart disease of native coronary artery without angina pectoris: Secondary | ICD-10-CM | POA: Insufficient documentation

## 2020-11-18 DIAGNOSIS — N400 Enlarged prostate without lower urinary tract symptoms: Secondary | ICD-10-CM | POA: Insufficient documentation

## 2020-11-18 DIAGNOSIS — I480 Paroxysmal atrial fibrillation: Secondary | ICD-10-CM | POA: Diagnosis not present

## 2020-11-18 DIAGNOSIS — Z86010 Personal history of colonic polyps: Secondary | ICD-10-CM | POA: Insufficient documentation

## 2020-11-18 DIAGNOSIS — E785 Hyperlipidemia, unspecified: Secondary | ICD-10-CM | POA: Diagnosis not present

## 2020-11-18 HISTORY — PX: ATRIAL FIBRILLATION ABLATION: EP1191

## 2020-11-18 LAB — POCT ACTIVATED CLOTTING TIME
Activated Clotting Time: 309 seconds
Activated Clotting Time: 291 seconds

## 2020-11-18 SURGERY — ATRIAL FIBRILLATION ABLATION
Anesthesia: General

## 2020-11-18 MED ORDER — HEPARIN SODIUM (PORCINE) 1000 UNIT/ML IJ SOLN
INTRAMUSCULAR | Status: AC
  Filled 2020-11-18: qty 2

## 2020-11-18 MED ORDER — PHENYLEPHRINE HCL-NACL 10-0.9 MG/250ML-% IV SOLN
INTRAVENOUS | Status: DC | PRN
  Administered 2020-11-18: 25 ug/min via INTRAVENOUS

## 2020-11-18 MED ORDER — ROCURONIUM BROMIDE 10 MG/ML (PF) SYRINGE
PREFILLED_SYRINGE | INTRAVENOUS | Status: DC | PRN
  Administered 2020-11-18: 30 mg via INTRAVENOUS
  Administered 2020-11-18: 70 mg via INTRAVENOUS

## 2020-11-18 MED ORDER — RIVAROXABAN 20 MG PO TABS
20.0000 mg | ORAL_TABLET | Freq: Once | ORAL | Status: AC
  Administered 2020-11-18: 20 mg via ORAL
  Filled 2020-11-18: qty 1

## 2020-11-18 MED ORDER — SODIUM CHLORIDE 0.9% FLUSH: 3.0000 mL | INTRAVENOUS | Status: DC | PRN

## 2020-11-18 MED ORDER — ONDANSETRON HCL 4 MG/2ML IJ SOLN: 4.0000 mg | Freq: Four times a day (QID) | INTRAMUSCULAR | Status: DC | PRN

## 2020-11-18 MED ORDER — SODIUM CHLORIDE 0.9 % IV SOLN: INTRAVENOUS | Status: DC

## 2020-11-18 MED ORDER — ACETAMINOPHEN 325 MG PO TABS: 650.0000 mg | ORAL_TABLET | ORAL | Status: DC | PRN

## 2020-11-18 MED ORDER — HEPARIN SODIUM (PORCINE) 1000 UNIT/ML IJ SOLN
INTRAMUSCULAR | Status: DC | PRN
  Administered 2020-11-18 (×2): 3000 [IU] via INTRAVENOUS

## 2020-11-18 MED ORDER — PROTAMINE SULFATE 10 MG/ML IV SOLN
INTRAVENOUS | Status: DC | PRN
  Administered 2020-11-18: 20 mg via INTRAVENOUS

## 2020-11-18 MED ORDER — HEPARIN (PORCINE) IN NACL 1000-0.9 UT/500ML-% IV SOLN
INTRAVENOUS | Status: DC | PRN
  Administered 2020-11-18: 500 mL

## 2020-11-18 MED ORDER — SODIUM CHLORIDE 0.9 % IV SOLN: 250.0000 mL | INTRAVENOUS | Status: DC | PRN

## 2020-11-18 MED ORDER — PROPOFOL 10 MG/ML IV BOLUS
INTRAVENOUS | Status: DC | PRN
  Administered 2020-11-18: 100 mg via INTRAVENOUS

## 2020-11-18 MED ORDER — HEPARIN SODIUM (PORCINE) 1000 UNIT/ML IJ SOLN
INTRAMUSCULAR | Status: DC | PRN
  Administered 2020-11-18: 15000 [IU] via INTRAVENOUS
  Administered 2020-11-18: 1000 [IU] via INTRAVENOUS

## 2020-11-18 MED ORDER — LIDOCAINE 2% (20 MG/ML) 5 ML SYRINGE
INTRAMUSCULAR | Status: DC | PRN
  Administered 2020-11-18: 20 mg via INTRAVENOUS

## 2020-11-18 MED ORDER — FENTANYL CITRATE (PF) 100 MCG/2ML IJ SOLN
INTRAMUSCULAR | Status: DC | PRN
  Administered 2020-11-18 (×2): 50 ug via INTRAVENOUS

## 2020-11-18 MED ORDER — PANTOPRAZOLE SODIUM 40 MG PO TBEC: 40.0000 mg | DELAYED_RELEASE_TABLET | Freq: Every day | ORAL | 0 refills | Status: DC

## 2020-11-18 MED ORDER — DEXAMETHASONE SODIUM PHOSPHATE 10 MG/ML IJ SOLN
INTRAMUSCULAR | Status: DC | PRN
  Administered 2020-11-18: 4 mg via INTRAVENOUS

## 2020-11-18 MED ORDER — HYDROCODONE-ACETAMINOPHEN 5-325 MG PO TABS: 1.0000 | ORAL_TABLET | ORAL | Status: DC | PRN

## 2020-11-18 MED ORDER — SUGAMMADEX SODIUM 200 MG/2ML IV SOLN
INTRAVENOUS | Status: DC | PRN
  Administered 2020-11-18: 200 mg via INTRAVENOUS

## 2020-11-18 MED ORDER — ONDANSETRON HCL 4 MG/2ML IJ SOLN
INTRAMUSCULAR | Status: DC | PRN
  Administered 2020-11-18: 4 mg via INTRAVENOUS

## 2020-11-18 MED ORDER — SODIUM CHLORIDE 0.9% FLUSH: 3.0000 mL | Freq: Two times a day (BID) | INTRAVENOUS | Status: DC

## 2020-11-18 MED ORDER — HEPARIN (PORCINE) IN NACL 1000-0.9 UT/500ML-% IV SOLN
INTRAVENOUS | Status: AC
  Filled 2020-11-18: qty 500

## 2020-11-18 SURGICAL SUPPLY — 19 items
BLANKET WARM UNDERBOD FULL ACC (MISCELLANEOUS) ×2 IMPLANT
CATH MAPPNG PENTARAY F 2-6-2MM (CATHETERS) IMPLANT
CATH SMTCH THERMOCOOL SF DF (CATHETERS) ×1 IMPLANT
CATH SOUNDSTAR ECO 8FR (CATHETERS) ×1 IMPLANT
CATH WEBSTER BI DIR CS D-F CRV (CATHETERS) ×1 IMPLANT
CLOSURE PERCLOSE PROSTYLE (VASCULAR PRODUCTS) ×3 IMPLANT
COVER SWIFTLINK CONNECTOR (BAG) ×2 IMPLANT
NDL BAYLIS TRANSSEPTAL 71CM (NEEDLE) IMPLANT
NEEDLE BAYLIS TRANSSEPTAL 71CM (NEEDLE) ×2 IMPLANT
PACK EP LATEX FREE (CUSTOM PROCEDURE TRAY) ×2
PACK EP LF (CUSTOM PROCEDURE TRAY) ×1 IMPLANT
PAD PRO RADIOLUCENT 2001M-C (PAD) ×2 IMPLANT
PATCH CARTO3 (PAD) ×1 IMPLANT
PENTARAY F 2-6-2MM (CATHETERS) ×2
SHEATH BAYLIS TORFLEX (SHEATH) ×1 IMPLANT
SHEATH PINNACLE 7F 10CM (SHEATH) ×2 IMPLANT
SHEATH PINNACLE 9F 10CM (SHEATH) ×1 IMPLANT
SHEATH PROBE COVER 6X72 (BAG) ×1 IMPLANT
TUBING SMART ABLATE COOLFLOW (TUBING) ×1 IMPLANT

## 2020-11-18 NOTE — Anesthesia Preprocedure Evaluation (Addendum)
Anesthesia Evaluation  Patient identified by MRN, date of birth, ID band Patient awake    Reviewed: Allergy & Precautions, NPO status , Patient's Chart, lab work & pertinent test results  History of Anesthesia Complications Negative for: history of anesthetic complications  Airway Mallampati: I  TM Distance: >3 FB Neck ROM: Full    Dental  (+) Dental Advisory Given   Pulmonary  11/17/2020 SARS coronavirus NEG   breath sounds clear to auscultation       Cardiovascular (-) angina+ CAD and + Cardiac Stents  + dysrhythmias Atrial Fibrillation  Rhythm:Irregular Rate:Normal  08/2020 ECHO: 55 to 60%. The  left ventricle has normal function.  2. Right ventricular systolic function is normal   Neuro/Psych negative neurological ROS     GI/Hepatic Neg liver ROS, GERD  Controlled,  Endo/Other  negative endocrine ROS  Renal/GU negative Renal ROS     Musculoskeletal   Abdominal   Peds  Hematology Xarelto   Anesthesia Other Findings   Reproductive/Obstetrics                            Anesthesia Physical Anesthesia Plan  ASA: III  Anesthesia Plan: General   Post-op Pain Management:    Induction: Intravenous  PONV Risk Score and Plan: 2 and Ondansetron, Dexamethasone and Treatment may vary due to age or medical condition  Airway Management Planned: Oral ETT  Additional Equipment: None  Intra-op Plan:   Post-operative Plan: Extubation in OR  Informed Consent: I have reviewed the patients History and Physical, chart, labs and discussed the procedure including the risks, benefits and alternatives for the proposed anesthesia with the patient or authorized representative who has indicated his/her understanding and acceptance.     Dental advisory given  Plan Discussed with: CRNA and Surgeon  Anesthesia Plan Comments:         Anesthesia Quick Evaluation

## 2020-11-18 NOTE — Transfer of Care (Signed)
Immediate Anesthesia Transfer of Care Note  Patient: Jerry Barr  Procedure(s) Performed: ATRIAL FIBRILLATION ABLATION (N/A )  Patient Location: Cath Lab  Anesthesia Type:General  Level of Consciousness: awake, alert  and oriented  Airway & Oxygen Therapy: Patient connected to face mask oxygen  Post-op Assessment: Post -op Vital signs reviewed and stable  Post vital signs: stable  Last Vitals:  Vitals Value Taken Time  BP    Temp    Pulse    Resp    SpO2      Last Pain:  Vitals:   11/18/20 1220  TempSrc:   PainSc: 0-No pain      Patients Stated Pain Goal: 3 (14/43/15 4008)  Complications: No complications documented.

## 2020-11-18 NOTE — Anesthesia Procedure Notes (Signed)
Procedure Name: Intubation Date/Time: 11/18/2020 1:29 PM Performed by: Colin Benton, CRNA Pre-anesthesia Checklist: Patient identified, Emergency Drugs available, Suction available and Patient being monitored Patient Re-evaluated:Patient Re-evaluated prior to induction Oxygen Delivery Method: Circle system utilized Preoxygenation: Pre-oxygenation with 100% oxygen Induction Type: IV induction Ventilation: Mask ventilation without difficulty Laryngoscope Size: Miller and 3 Grade View: Grade II Tube type: Oral Tube size: 8.0 mm Number of attempts: 1 Airway Equipment and Method: Stylet Placement Confirmation: ETT inserted through vocal cords under direct vision,  positive ETCO2 and breath sounds checked- equal and bilateral Secured at: 22 cm Tube secured with: Tape Dental Injury: Teeth and Oropharynx as per pre-operative assessment

## 2020-11-18 NOTE — Discharge Instructions (Signed)
Femoral Site Care  This sheet gives you information about how to care for yourself after your procedure. Your health care provider may also give you more specific instructions. If you have problems or questions, contact your health care provider. What can I expect after the procedure? After the procedure, it is common to have:  Bruising that usually fades within 1-2 weeks.  Tenderness at the site. Follow these instructions at home: Wound care  Follow instructions from your health care provider about how to take care of your insertion site. Make sure you: ? Wash your hands with soap and water before you change your bandage (dressing). If soap and water are not available, use hand sanitizer. ? Change your dressing as told by your health care provider. ? Leave stitches (sutures), skin glue, or adhesive strips in place. These skin closures may need to stay in place for 2 weeks or longer. If adhesive strip edges start to loosen and curl up, you may trim the loose edges. Do not remove adhesive strips completely unless your health care provider tells you to do that.  Do not take baths, swim, or use a hot tub until your health care provider approves.  You may shower 24-48 hours after the procedure or as told by your health care provider. ? Gently wash the site with plain soap and water. ? Pat the area dry with a clean towel. ? Do not rub the site. This may cause bleeding.  Do not apply powder or lotion to the site. Keep the site clean and dry.  Check your femoral site every day for signs of infection. Check for: ? Redness, swelling, or pain. ? Fluid or blood. ? Warmth. ? Pus or a bad smell. Activity  For the first 2-3 days after your procedure, or as long as directed: ? Avoid climbing stairs as much as possible. ? Do not squat.  Do not lift anything that is heavier than 10 lb (4.5 kg), or the limit that you are told, until your health care provider says that it is safe.  Rest as  directed. ? Avoid sitting for a long time without moving. Get up to take short walks every 1-2 hours.  Do not drive for 24 hours if you were given a medicine to help you relax (sedative). General instructions  Take over-the-counter and prescription medicines only as told by your health care provider.  Keep all follow-up visits as told by your health care provider. This is important. Contact a health care provider if you have:  A fever or chills.  You have redness, swelling, or pain around your insertion site. Get help right away if:  The catheter insertion area swells very fast.  You pass out.  You suddenly start to sweat or your skin gets clammy.  The catheter insertion area is bleeding, and the bleeding does not stop when you hold steady pressure on the area.  The area near or just beyond the catheter insertion site becomes pale, cool, tingly, or numb. These symptoms may represent a serious problem that is an emergency. Do not wait to see if the symptoms will go away. Get medical help right away. Call your local emergency services (911 in the [place] Do not drive yourself to the hospital. Summary  After the procedure, it is common to have bruising that usually fades within 1-2 weeks.  Check your femoral site every day for signs of infection.  Do not lift anything that is heavier than 10 lb (4.5 kg), or   the limit that you are told, until your health care provider says that it is safe. This information is not intended to replace advice given to you by your health care provider. Make sure you discuss any questions you have with your health care provider. Document Revised: 02/29/2020 Document Reviewed: 02/29/2020 Elsevier Patient Education  [place] procedure care instructions No driving for 4 days. No lifting over 5 lbs for 1 week. No vigorous or sexual activity for 1 week. You may return to work/your usual activities on 11/26/20. Keep procedure site clean & dry. If  you notice increased pain, swelling, bleeding or pus, call/return!  You may shower after 24 hours, but no soaking in baths/hot tubs/pools for 1 week.   You have an appointment set up with the [facility].  Multiple studies have shown that being followed by a dedicated atrial fibrillation clinic in addition to the standard care you receive from your other physicians improves health. We believe that enrollment in the atrial fibrillation clinic will allow [place] to better care for you.   The phone number to the [facility] is [phone]. The clinic is staffed Monday through Friday from 8:30am to 5pm.  Parking Directions: The clinic is located in the Heart and Vascular Building connected to [facility]. 1)From [address] turn on to [address] and go to the 3rd entrance  (Heart and Vascular entrance) on the right. 2)Look to the right for Heart &Vascular Parking Garage. 3)A code for the entrance is required for June is 4233.   4)Take the elevators to the 1st floor. Registration is in the room with the glass walls at the end of the hallway.  If you have any trouble parking or locating the clinic, please don't hesitate to call [phone].

## 2020-11-18 NOTE — H&P (Signed)
PCP: Antony Contras, MD Primary Cardiologist: Dr Martinique Primary EP: Dr Donita Brooks Careaga is a 80 y.o. male who presents today for routine electrophysiology study and ablation for afib.  Since recent tikosyn load, the patient reports doing very well.  Unfortunately, he continues to have afib.  + fatigue and decreased exercise tolerance.  Today, he denies symptoms of palpitations, chest pain, shortness of breath,  lower extremity edema, dizziness, presyncope, or syncope.  The patient is otherwise without complaint today.       Past Medical History:  Diagnosis Date  . Allergy   . Aortic atherosclerosis ([place])   . Atrial fibrillation ([place])   . BPH (benign prostatic hyperplasia)   . CAD (coronary artery disease)   . Cancer (HCC)    basal cell ca - face, nose and right leg  . Clotting disorder (HCC)    a fib hx - Xarelto  . Coronary artery disease    Promus drug-eluting stent to a 99% mid LAD, 40-50% narrowing in the distal LAD with mild to moderate disease in the circ and RCA b. 5/6 PCI/DESx1 to mLcx  . Dyslipidemia   . ED (erectile dysfunction)   . Hearing loss   . Hyperplastic colon polyp   . Hypoglycemia   . Orthostatic hypotension   . REM sleep behavior disorder    not tested        Past Surgical History:  Procedure Laterality Date  . BUBBLE STUDY  08/27/2020   Procedure: BUBBLE STUDY;  Surgeon: Freada Bergeron, MD;  Location: [place];  Service: Cardiovascular;;  . CARDIOVERSION N/A 04/23/2016   Procedure: CARDIOVERSION;  Surgeon: [name], MD;  Location: [place];  Service: Cardiovascular;  Laterality: N/A;  . CARDIOVERSION N/A 08/21/2020   Procedure: CARDIOVERSION;  Surgeon: Dorothy Spark, MD;  Location: [facility] ENDOSCOPY;  Service: Cardiovascular;  Laterality: N/A;  . CARDIOVERSION N/A 08/29/2020   Procedure: CARDIOVERSION;  Surgeon: [name], MD;  Location: [facility] ENDOSCOPY;  Service: Cardiovascular;  Laterality: N/A;  .  CARDIOVERSION N/A 09/24/2020   Procedure: CARDIOVERSION;  Surgeon: Freada Bergeron, MD;  Location: [facility] ENDOSCOPY;  Service: Cardiovascular;  Laterality: N/A;  . COLONOSCOPY    . CORONARY ANGIOPLASTY WITH STENT PLACEMENT    . CORONARY STENT INTERVENTION N/A 11/15/2018   Procedure: CORONARY STENT INTERVENTION;  Surgeon: Burnell Blanks, MD;  Location: [place] CV LAB;  Service: Cardiovascular;  Laterality: N/A;  . LEFT HEART CATH AND CORONARY ANGIOGRAPHY N/A 11/15/2018   Procedure: LEFT HEART CATH AND CORONARY ANGIOGRAPHY;  Surgeon: Burnell Blanks, MD;  Location: [place] CV LAB;  Service: Cardiovascular;  Laterality: N/A;  . LEFT HEART CATH AND CORONARY ANGIOGRAPHY N/A 06/25/2019   Procedure: LEFT HEART CATH AND CORONARY ANGIOGRAPHY;  Surgeon: Martinique, Peter M, MD;  Location: [place] CV LAB;  Service: Cardiovascular;  Laterality: N/A;  . POLYPECTOMY    . TEE WITHOUT CARDIOVERSION N/A 04/23/2016   Procedure: TRANSESOPHAGEAL ECHOCARDIOGRAM (TEE);  Surgeon: [name], MD;  Location: [place];  Service: Cardiovascular;  Laterality: N/A;  . TEE WITHOUT CARDIOVERSION N/A 08/27/2020   Procedure: TRANSESOPHAGEAL ECHOCARDIOGRAM (TEE);  Surgeon: Freada Bergeron, MD;  Location: [facility] ENDOSCOPY;  Service: Cardiovascular;  Laterality: N/A;  . UPPER GASTROINTESTINAL ENDOSCOPY      ROS- all systems are reviewed and negatives except as per HPI above        Current Outpatient Medications  Medication Sig Dispense Refill  . acetaminophen (TYLENOL) 500 MG tablet Take 500 mg  by mouth every 6 (six) hours as needed for mild pain (or headaches).    Marland Kitchen atorvastatin (LIPITOR) 20 MG tablet TAKE 1 TABLET BY MOUTH EVERY DAY (Patient taking differently: Take 20 mg by mouth daily.) 90 tablet 3  . Cholecalciferol (VITAMIN D-3) 1000 UNITS CAPS Take 1,000 Units by mouth daily.    Marland Kitchen dofetilide (TIKOSYN) 500 MCG capsule Take 1 capsule (500 mcg total) by mouth 2 (two) times  daily. 60 capsule 3  . fluticasone (FLONASE) 50 MCG/ACT nasal spray Place 1 spray into both nostrils daily as needed (seasonal allergies).     . loratadine (CLARITIN) 10 MG tablet Take 10 mg by mouth daily.    . Melatonin 3 MG TABS Take 6 mg by mouth at bedtime.    . midodrine (PROAMATINE) 5 MG tablet Take 5 mg by mouth See admin instructions. Take 5 mg by mouth in the morning and may take a second 5 mg tablet at lunchtime as needed for low bp    . Naphazoline-Pheniramine (ALLERGY EYE OP) Place 1 drop into both eyes daily as needed (allergies).    . polyethylene glycol (MIRALAX / GLYCOLAX) 17 g packet Take 17 g by mouth daily as needed for mild constipation (MIX INTO 8 OUNCES OF WATER AND DRINK).    . rivaroxaban (XARELTO) 20 MG TABS tablet Take 1 tablet (20 mg total) by mouth daily with breakfast. 30 tablet 6  . Wheat Dextrin (BENEFIBER PO) Take by mouth See admin instructions. Mix 2 teaspoonsful of powder into 6 ounces of water and drink once a day as needed for mild constipation or to add fiber to the diet     No current facility-administered medications for this visit.    Physical Exam: Vitals:   11/18/20 1140  BP: 126/73  Pulse: (!) 123  Temp: 98.4 F (36.9 C)  SpO2: 98%   GEN- The patient is well appearing, alert and oriented x 3 today.   Head- normocephalic, atraumatic Eyes-  Sclera clear, conjunctiva pink Ears- hearing intact Oropharynx- clear Lungs- Clear to ausculation bilaterally, normal work of breathing Heart- irregular rate and rhythm, no murmurs, rubs or gallops, PMI not laterally displaced GI- soft, NT, ND, + BS Extremities- no clubbing, cyanosis, or edema   Assessment and Plan:  1. Persistent atrial fibrillation The patient has symptomatic, recurrent atrial fibrillation.  His afib has progressed to persistent. he has failed medical therapy with amiodarone and tikosyn. Chads2vasc score is 4.  he is anticoagulated with xarelto .   Risk,  benefits, and alternatives to EP study and radiofrequency ablation for afib were also discussed in detail today. These risks include but are not limited to stroke, bleeding, vascular damage, tamponade, perforation, damage to the esophagus, lungs, and other structures, pulmonary vein stenosis, worsening renal function, and death. The patient understands these risk and wishes to proceed.  \  Cardiac CT reviewed at length with patient.  He reports compliance with xarelto without interruption.  Thompson Grayer MD, [place] 11/18/2020 12:45 PM

## 2020-11-18 NOTE — Anesthesia Postprocedure Evaluation (Signed)
Anesthesia Post Note  Patient: Jerry Barr  Procedure(s) Performed: ATRIAL FIBRILLATION ABLATION (N/A )     Patient location during evaluation: PACU Anesthesia Type: General Level of consciousness: awake and alert, patient cooperative and oriented Pain management: pain level controlled Vital Signs Assessment: post-procedure vital signs reviewed and stable Respiratory status: spontaneous breathing, nonlabored ventilation and respiratory function stable Cardiovascular status: blood pressure returned to baseline and stable Postop Assessment: no apparent nausea or vomiting Anesthetic complications: no Comments: Difficulty urinating, awaiting return of function   No complications documented.  Last Vitals:  Vitals:   11/18/20 1630 11/18/20 1635  BP: 127/85 (!) 142/97  Pulse: 85 89  Resp: 13 (!) 21  Temp:    SpO2: 98% 98%    Last Pain:  Vitals:   11/18/20 1649  TempSrc:   PainSc: 0-No pain                 Adaiah Jaskot,E. Latria Mccarron

## 2020-11-19 ENCOUNTER — Encounter (HOSPITAL_COMMUNITY): Payer: Self-pay | Admitting: Internal Medicine

## 2020-11-25 ENCOUNTER — Telehealth: Payer: Self-pay | Admitting: Internal Medicine

## 2020-11-25 NOTE — Telephone Encounter (Signed)
Advised the patient to take the bandage off ASAP. Clean the area and look for any signs of infection, if signs are present call and let us know.  Patient verbalized understanding.

## 2020-11-25 NOTE — Telephone Encounter (Signed)
Patient had an afib ablation on 11/18/20 with Dr. Rayann Heman. He would like to know when to remove the bandage at the site of the incision. He states he reviewed the wound care information sheet, but it doesn't specify how long he needs to leave the bandage on. Please advise.

## 2020-11-28 ENCOUNTER — Telehealth: Payer: Self-pay | Admitting: Student

## 2020-11-28 ENCOUNTER — Telehealth: Payer: Self-pay | Admitting: Internal Medicine

## 2020-11-28 NOTE — Telephone Encounter (Signed)
Pt c/o medication issue:  1. Name of Medication: pantoprazole (PROTONIX) 40 MG tablet  2. How are you currently taking this medication (dosage and times per day)? Take 1 tablet (40 mg total) by mouth daily. New med as of 11/19/20  3. Are you having a reaction (difficulty breathing--STAT)? No  4. What is your medication issue? Pt said since his recent procedure on 11/18/20 he have been having soft bowel movements and his diarrhea started yesterday morning. Pt said this did not start until after he started taking this medicine on 11/19/20

## 2020-11-28 NOTE — Telephone Encounter (Signed)
Diarrhea or constipation are common side effects of Protonix.  Please see telephone note from [facility]. Looks like she already talked to patient about this issue and gave recommendations.  " Advised that this is OK. If symptoms don't improve with this recommended reaching out to PCP. "

## 2020-11-28 NOTE — Telephone Encounter (Signed)
See other phone note today for advisement

## 2020-11-28 NOTE — Telephone Encounter (Signed)
    Patient called Answering Service this morning with concerns of diarrhea. Called and spoke with patient. He had atrial fibrillation ablation on 11/18/2020 and was prescribed Protonix. Since then, he has had very frequent soft bowel movement and over the last 2 days a couple of episodes diarrhea. Doing well from a cardiac standpoint. He wants to know if is is OK to stop the Protonix. Advised that this is OK. If symptoms don't improve with this recommended reaching out to PCP. Patient voiced understanding.  Darreld Mclean, PA-C 11/28/2020 8:01 AM

## 2020-12-01 NOTE — Telephone Encounter (Addendum)
Patient diarrhea subsided with discontinuation of protonix. Pt is going to try OTC prilosec in place of protonix. If diarrhea returns will stop. Pt in agreement.

## 2020-12-01 NOTE — Addendum Note (Signed)
Addended by: Juluis Mire on: 12/01/2020 08:56 AM   Modules accepted: Orders

## 2020-12-16 ENCOUNTER — Ambulatory Visit (HOSPITAL_COMMUNITY)
Admission: RE | Admit: 2020-12-16 | Discharge: 2020-12-16 | Disposition: A | Discharge status: Home / Self Care | Payer: Medicare Other | Source: Ambulatory Visit | Attending: Physician Assistant | Admitting: Physician Assistant

## 2020-12-16 ENCOUNTER — Encounter (HOSPITAL_COMMUNITY): Payer: Self-pay | Admitting: Physician Assistant

## 2020-12-16 ENCOUNTER — Other Ambulatory Visit: Payer: Self-pay

## 2020-12-16 VITALS — BP 114/80 | HR 72 | Ht 72.0 in | Wt 156.2 lb

## 2020-12-16 DIAGNOSIS — Z79899 Other long term (current) drug therapy: Secondary | ICD-10-CM | POA: Diagnosis not present

## 2020-12-16 DIAGNOSIS — E785 Hyperlipidemia, unspecified: Secondary | ICD-10-CM | POA: Diagnosis not present

## 2020-12-16 DIAGNOSIS — I2510 Atherosclerotic heart disease of native coronary artery without angina pectoris: Secondary | ICD-10-CM | POA: Diagnosis not present

## 2020-12-16 DIAGNOSIS — I4819 Other persistent atrial fibrillation: Secondary | ICD-10-CM | POA: Diagnosis not present

## 2020-12-16 DIAGNOSIS — I951 Orthostatic hypotension: Secondary | ICD-10-CM | POA: Diagnosis not present

## 2020-12-16 DIAGNOSIS — D6869 Other thrombophilia: Secondary | ICD-10-CM

## 2020-12-16 NOTE — Progress Notes (Signed)
Primary Care Physician: Antony Contras, MD Primary Cardiologist: Dr Martinique Primary Electrophysiologist: Dr Caryl Comes Referring Physician: [facility]   Everson Mott is a 80 y.o. male with a history of paroxysmal atrial fibrillation, CAD, BPH, HLD, and chronic hypotension who presents for follow up in the [facility]. He is on Xarelto for a CHADS2VASC score of 3. Patient seen by Dr Martinique 06/20/19 with symptoms of exertional neck and shoulder pain similar to his previous anginal symptoms. LHC 06/25/19 showed nonobstructive disease with patent stents. His amiodarone was discontinued 2/2 side effects with autonomic dysfunction. He was in his usual health until 08/15/20 when he started having symptoms of fatigue and intermittent elevated heart rates. He underwent DCCV on 08/21/20 but unfortunately had early return of his afib and went to the ED on 08/25/20. He was loaded on dofetilide at that time and underwent repeat DCCV on 08/29/20.   On follow up today, patient is s/p afib ablation 11/18/20 with Dr Rayann Heman. He did have one episode of afib yesterday which lasted about 4 hours before converting to SR. He denies any CP, swallowing pain, or groin issues. He did have diarrhea with Protonix and this was discontinued.   Today, he denies symptoms of palpitaitons, chest pain, shortness of breath, orthopnea, PND, lower extremity edema, syncope, snoring, daytime somnolence, bleeding, or neurologic sequela. The patient is tolerating medications without difficulties and is otherwise without complaint today.    Atrial Fibrillation Risk Factors:  he does not have symptoms or diagnosis of sleep apnea. he does not have a history of alcohol use. The patient does not have a history of early familial atrial fibrillation or other arrhythmias.  he has a BMI of Body mass index is 21.18 kg/m.Marland Kitchen Filed Weights   12/16/20 1422  Weight: 70.9 kg    Family History  Problem Relation Age of Onset   . Breast cancer Mother   . Colon cancer Mother   . CAD Father   . Heart attack Paternal Grandmother   . Colon cancer Brother   . Esophageal cancer Brother   . Liver cancer Brother   . Cancer Brother   . Leukemia Paternal Uncle   . Bone cancer Paternal Grandfather   . Pancreatic cancer Neg Hx   . Prostate cancer Neg Hx   . Rectal cancer Neg Hx   . Stomach cancer Neg Hx      Atrial Fibrillation Management history:  Previous antiarrhythmic drugs: amiodarone, dofetilide  Previous cardioversions: 2017, 10/15/18, 08/21/20, 08/29/20, 09/24/20 Previous ablations: 11/18/20 CHADS2VASC score: 3 Anticoagulation history: Xarelto    Past Medical History:  Diagnosis Date  . Allergy   . Aortic atherosclerosis ([place])   . Atrial fibrillation ([place])   . BPH (benign prostatic hyperplasia)   . CAD (coronary artery disease)   . Cancer (HCC)    basal cell ca - face, nose and right leg  . Clotting disorder (HCC)    a fib hx - Xarelto  . Coronary artery disease    Promus drug-eluting stent to a 99% mid LAD, 40-50% narrowing in the distal LAD with mild to moderate disease in the circ and RCA b. 5/6 PCI/DESx1 to mLcx  . Dyslipidemia   . ED (erectile dysfunction)   . Hearing loss   . Hyperplastic colon polyp   . Hypoglycemia   . Orthostatic hypotension   . REM sleep behavior disorder    not tested   Past Surgical History:  Procedure Laterality Date  . ATRIAL  FIBRILLATION ABLATION N/A 11/18/2020   Procedure: ATRIAL FIBRILLATION ABLATION;  Surgeon: Thompson Grayer, MD;  Location: [place] [facility];  Service: Cardiovascular;  Laterality: N/A;  . BUBBLE STUDY  08/27/2020   Procedure: BUBBLE STUDY;  Surgeon: Freada Bergeron, MD;  Location: [place];  Service: Cardiovascular;;  . CARDIOVERSION N/A 04/23/2016   Procedure: CARDIOVERSION;  Surgeon: [name], MD;  Location: [place];  Service: Cardiovascular;  Laterality: N/A;  . CARDIOVERSION N/A 08/21/2020   Procedure: CARDIOVERSION;   Surgeon: Dorothy Spark, MD;  Location: [facility] ENDOSCOPY;  Service: Cardiovascular;  Laterality: N/A;  . CARDIOVERSION N/A 08/29/2020   Procedure: CARDIOVERSION;  Surgeon: [name], MD;  Location: [facility] ENDOSCOPY;  Service: Cardiovascular;  Laterality: N/A;  . CARDIOVERSION N/A 09/24/2020   Procedure: CARDIOVERSION;  Surgeon: Freada Bergeron, MD;  Location: [facility] ENDOSCOPY;  Service: Cardiovascular;  Laterality: N/A;  . COLONOSCOPY    . CORONARY ANGIOPLASTY WITH STENT PLACEMENT    . CORONARY STENT INTERVENTION N/A 11/15/2018   Procedure: CORONARY STENT INTERVENTION;  Surgeon: Burnell Blanks, MD;  Location: [place] [facility];  Service: Cardiovascular;  Laterality: N/A;  . LEFT HEART CATH AND CORONARY ANGIOGRAPHY N/A 11/15/2018   Procedure: LEFT HEART CATH AND CORONARY ANGIOGRAPHY;  Surgeon: Burnell Blanks, MD;  Location: [place] [facility];  Service: Cardiovascular;  Laterality: N/A;  . LEFT HEART CATH AND CORONARY ANGIOGRAPHY N/A 06/25/2019   Procedure: LEFT HEART CATH AND CORONARY ANGIOGRAPHY;  Surgeon: Martinique, Peter M, MD;  Location: [place] [facility];  Service: Cardiovascular;  Laterality: N/A;  . POLYPECTOMY    . TEE WITHOUT CARDIOVERSION N/A 04/23/2016   Procedure: TRANSESOPHAGEAL ECHOCARDIOGRAM (TEE);  Surgeon: [name], MD;  Location: [place];  Service: Cardiovascular;  Laterality: N/A;  . TEE WITHOUT CARDIOVERSION N/A 08/27/2020   Procedure: TRANSESOPHAGEAL ECHOCARDIOGRAM (TEE);  Surgeon: Freada Bergeron, MD;  Location: [facility] ENDOSCOPY;  Service: Cardiovascular;  Laterality: N/A;  . UPPER GASTROINTESTINAL ENDOSCOPY      Current Outpatient Medications  Medication Sig Dispense Refill  . acetaminophen (TYLENOL) 500 MG tablet Take 500 mg by mouth every 6 (six) hours as needed for mild pain (or headaches).    Marland Kitchen atorvastatin (LIPITOR) 20 MG tablet TAKE 1 TABLET BY MOUTH EVERY DAY 90 tablet 3  . Cholecalciferol (VITAMIN D-3) 1000 UNITS CAPS Take 1,000 Units by  mouth daily.    Marland Kitchen dofetilide (TIKOSYN) 500 MCG capsule Take 1 capsule (500 mcg total) by mouth 2 (two) times daily. 180 capsule 3  . fluticasone (FLONASE) 50 MCG/ACT nasal spray Place 1 spray into both nostrils daily as needed for allergies (seasonal allergies).    . loratadine (CLARITIN) 10 MG tablet Take 10 mg by mouth daily.    . Melatonin 3 MG TABS Take 6 mg by mouth at bedtime.    . midodrine (PROAMATINE) 5 MG tablet Take 5 mg by mouth See admin instructions. Take 5 mg by mouth in the morning and may take a second 5 mg tablet at lunchtime as needed for low bp    . Naphazoline-Pheniramine (ALLERGY EYE OP) Place 1 drop into both eyes daily as needed (allergies).    Marland Kitchen omeprazole (PRILOSEC) 20 MG capsule Take 20 mg by mouth daily.    . polyethylene glycol (MIRALAX / GLYCOLAX) 17 g packet Take 17 g by mouth daily as needed for mild constipation (MIX INTO 8 OUNCES OF WATER AND DRINK).    . rivaroxaban (XARELTO) 20 MG TABS tablet Take 1 tablet (  20 mg total) by mouth daily with breakfast. 30 tablet 6  . Wheat Dextrin (BENEFIBER PO) Take 10 BAU/100 mL by mouth daily. powder into 6 ounces of water and drink once a day as needed for mild constipation or to add fiber to the diet     No current facility-administered medications for this encounter.    Allergies  Allergen Reactions  . Penicillins Rash    Did it involve swelling of the face/tongue/throat, SOB, or low BP? No Did it involve sudden or severe rash/hives, skin peeling, or any reaction on the inside of your mouth or nose? No Did you need to seek medical attention at a hospital or doctor's office? No When did it last happen?50+ years  If all above answers are "NO", may proceed with cephalosporin use.     Social History   Socioeconomic History  . Marital status: Married    Spouse name: Sheria Lang  . Number of children: Not on file  . Years of education: Not on file  . Highest education level: Professional school degree (e.g., MD,  DDS, DVM, JD)  Occupational History    Comment: retired  CE  Tobacco Use  . Smoking status: Never Smoker  . Smokeless tobacco: Never Used  Vaping Use  . Vaping Use: Never used  Substance and Sexual Activity  . Alcohol use: Not Currently    Alcohol/week: 4.0 standard drinks    Types: 4 Glasses of wine per week    Comment: 3oz of red wine  . Drug use: No  . Sexual activity: Not Currently  Other Topics Concern  . Not on file  Social History Narrative   Lives with wife   Almost no caffeine   Social Determinants of Health   Financial Resource Strain: Not on file  Food Insecurity: Not on file  Transportation Needs: Not on file  Physical Activity: Not on file  Stress: Not on file  Social Connections: Not on file  Intimate Partner Violence: Not on file     ROS- All systems are reviewed and negative except as per the HPI above.  Physical Exam: Vitals:   12/16/20 1422  BP: 114/80  Pulse: 72  Weight: 70.9 kg  Height: 6' (1.829 m)    GEN- The patient is a well appearing elderly male, alert and oriented x 3 today.   HEENT-head normocephalic, atraumatic, sclera clear, conjunctiva pink, hearing intact, trachea midline. Lungs- Clear to ausculation bilaterally, normal work of breathing Heart- Regular rate and rhythm, no murmurs, rubs or gallops  GI- soft, NT, ND, + BS Extremities- no clubbing, cyanosis, or edema MS- no significant deformity or atrophy Skin- no rash or lesion Psych- euthymic mood, full affect Neuro- strength and sensation are intact   Wt Readings from Last 3 Encounters:  12/16/20 70.9 kg  11/18/20 68.9 kg  10/23/20 68.9 kg    EKG today demonstrates  SR, PVC Vent. rate 72 BPM PR interval 166 ms QRS duration 84 ms QT/QTcB 420/459 ms  Echo 12/14/19 demonstrated  1. Left ventricular ejection fraction, by estimation, is 55 to 60%. The  left ventricle has normal function. The left ventricle has no regional  wall motion abnormalities. Left ventricular  diastolic parameters were  normal. The average left ventricular  global longitudinal strain is -20.5 %. The global longitudinal strain is  normal.  2. Right ventricular systolic function is normal. The right ventricular  size is normal. There is normal pulmonary artery systolic pressure. The  estimated right ventricular systolic pressure  is 31.5 mmHg.  3. The mitral valve is normal in structure. Mild mitral valve  regurgitation. No evidence of mitral stenosis.  4. The aortic valve is normal in structure. Aortic valve regurgitation is  not visualized. No aortic stenosis is present.  5. The inferior vena cava is normal in size with greater than 50%  respiratory variability, suggesting right atrial pressure of 3 mmHg.   Epic records are reviewed at length today  Assessment and Plan:  1. Persistent atrial fibrillation S/p dofetilide loading 2/15-2/19/22 S/p afib ablation with Dr Rayann Heman on 11/18/20 Reassured patient that some afib episodes are not uncommon for up to 3 months post ablation.  Continue dofetilide 500 mcg BID. QT stable.  Continue Xarelto 20 mg daily with no missed doses for at least 3 months post ablation.   This patients CHA2DS2-VASc Score and unadjusted Ischemic Stroke Rate (% per year) is equal to 3.2 % stroke rate/year from a score of 3  Above score calculated as 1 point each if present [CHF, HTN, DM, Vascular=MI/PAD/Aortic Plaque, Age if 65-74, or Male] Above score calculated as 2 points each if present [Age > 75, or Stroke/TIA/TE]  2. CAD S/p DES to LAD 2011 and mid cirfumflex 11/2018. Repeat LHC 06/25/19 showed nonobstructive disease and patent stents. No anginal symptoms.  3. Orthostatic Hypotension On midodrine PRN, followed by Dr Caryl Comes. BP stable today.    Follow up with Dr Rayann Heman and Dr Caryl Comes as scheduled.    [facility] [address] [place], [place] [address] [phone] 12/16/2020 2:32 PM

## 2020-12-17 DIAGNOSIS — I480 Paroxysmal atrial fibrillation: Secondary | ICD-10-CM | POA: Diagnosis not present

## 2020-12-17 DIAGNOSIS — I2510 Atherosclerotic heart disease of native coronary artery without angina pectoris: Secondary | ICD-10-CM | POA: Diagnosis not present

## 2020-12-17 DIAGNOSIS — N401 Enlarged prostate with lower urinary tract symptoms: Secondary | ICD-10-CM | POA: Diagnosis not present

## 2020-12-17 DIAGNOSIS — I4891 Unspecified atrial fibrillation: Secondary | ICD-10-CM | POA: Diagnosis not present

## 2020-12-17 DIAGNOSIS — E785 Hyperlipidemia, unspecified: Secondary | ICD-10-CM | POA: Diagnosis not present

## 2020-12-17 DIAGNOSIS — E7800 Pure hypercholesterolemia, unspecified: Secondary | ICD-10-CM | POA: Diagnosis not present

## 2020-12-26 ENCOUNTER — Other Ambulatory Visit: Payer: Self-pay | Admitting: Internal Medicine

## 2020-12-27 DIAGNOSIS — U071 COVID-19: Secondary | ICD-10-CM | POA: Diagnosis not present

## 2020-12-27 DIAGNOSIS — R509 Fever, unspecified: Secondary | ICD-10-CM | POA: Diagnosis not present

## 2020-12-27 DIAGNOSIS — R52 Pain, unspecified: Secondary | ICD-10-CM | POA: Diagnosis not present

## 2020-12-27 DIAGNOSIS — R0981 Nasal congestion: Secondary | ICD-10-CM | POA: Diagnosis not present

## 2021-01-16 ENCOUNTER — Other Ambulatory Visit: Payer: Self-pay | Admitting: Cardiology

## 2021-01-16 NOTE — Telephone Encounter (Signed)
Prescription refill request for Xarelto received.  Indication: Atrial fib Last office visit: 12/16/20  C. Fenton PA Weight: 70.9kg Age: 80 Scr: 0.96 on 10/29/20 CrCl: 62.57  Based on above findings Xarelto 20mg  daily is the appropriate dose.  Refill approved.

## 2021-02-15 ENCOUNTER — Encounter: Payer: Self-pay | Admitting: Dermatology

## 2021-02-15 NOTE — Progress Notes (Signed)
   Follow-Up Visit   Subjective  Jerry Barr is a 80 y.o. male who presents for the following: Skin Problem (Check spot on right ear rim and right cheek. Possible freeze? /Had eczema in ear/scalp. Used betamethasone foam in the past that helped patient. ).  Crusts on face, recheck eczema. Location:  Duration:  Quality:  Associated Signs/Symptoms: Modifying Factors:  Severity:  Timing: Context:   Objective  Well appearing patient in no apparent distress; mood and affect are within normal limits. Left Ear, Right Ear, Right Forehead, Right Zygomatic Area 2 to 4 mm patches of pink gritty crust  Left Occipital Scalp Small patches of chronic dermatitis and fine scale, slight scale in distal ear canal.  Better fits eczema than mild psoriasis.    All skin waist up examined.   Assessment & Plan    AK (actinic keratosis) (4) Left Ear; Right Ear; Right Zygomatic Area; Right Forehead  Destruction of lesion - Left Ear, Right Ear, Right Forehead, Right Zygomatic Area Complexity: simple   Destruction method: cryotherapy   Informed consent: discussed and consent obtained   Timeout:  patient name, date of birth, surgical site, and procedure verified Lesion destroyed using liquid nitrogen: Yes   Cryotherapy cycles:  4 Outcome: patient tolerated procedure well with no complications   Post-procedure details: wound care instructions given    Eczema, unspecified type Left Occipital Scalp  May refill generic betamethasone foam; if out-of-pocket cost has become high, he can call for a substitute.  Betamethasone Valerate 0.12 % foam - Left Occipital Scalp Apply 1 application topically 2 (two) times daily.      I, Lavonna Monarch, MD, have reviewed all documentation for this visit.  The documentation on 02/15/21 for the exam, diagnosis, procedures, and orders are all accurate and complete.

## 2021-02-16 ENCOUNTER — Ambulatory Visit (INDEPENDENT_AMBULATORY_CARE_PROVIDER_SITE_OTHER): Payer: Medicare Other | Admitting: Internal Medicine

## 2021-02-16 ENCOUNTER — Other Ambulatory Visit: Payer: Self-pay

## 2021-02-16 VITALS — BP 116/74 | HR 72 | Ht 72.0 in | Wt 158.0 lb

## 2021-02-16 DIAGNOSIS — I2510 Atherosclerotic heart disease of native coronary artery without angina pectoris: Secondary | ICD-10-CM

## 2021-02-16 DIAGNOSIS — I10 Essential (primary) hypertension: Secondary | ICD-10-CM | POA: Diagnosis not present

## 2021-02-16 DIAGNOSIS — I4819 Other persistent atrial fibrillation: Secondary | ICD-10-CM

## 2021-02-16 NOTE — Progress Notes (Signed)
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  Since his recent afib ablation, the patient reports doing very well.  he denies procedure related complications and is pleased with the results of the procedure.  Today, he denies symptoms of palpitations, chest pain, shortness of breath,  lower extremity edema, dizziness, presyncope, or syncope.  The patient is otherwise without complaint today.   Past Medical History:  Diagnosis Date   Allergy    Aortic atherosclerosis (HCC)    Atrial fibrillation (HCC)    BPH (benign prostatic hyperplasia)    CAD (coronary artery disease)    Cancer (HCC)    basal cell ca - face, nose and right leg   Clotting disorder (HCC)    a fib hx - Xarelto   Coronary artery disease    Promus drug-eluting stent to a 99% mid LAD, 40-50% narrowing in the distal LAD with mild to moderate disease in the circ and RCA b. 5/6 PCI/DESx1 to mLcx   Dyslipidemia    ED (erectile dysfunction)    Hearing loss    Hyperplastic colon polyp    Hypoglycemia    Orthostatic hypotension    REM sleep behavior disorder    not tested   Past Surgical History:  Procedure Laterality Date   ATRIAL FIBRILLATION ABLATION N/A 11/18/2020   Procedure: ATRIAL FIBRILLATION ABLATION;  Surgeon: Thompson Grayer, MD;  Location: [place] [facility];  Service: Cardiovascular;  Laterality: N/A;   BUBBLE STUDY  08/27/2020   Procedure: BUBBLE STUDY;  Surgeon: Freada Bergeron, MD;  Location: [place];  Service: Cardiovascular;;   CARDIOVERSION N/A 04/23/2016   Procedure: CARDIOVERSION;  Surgeon: [name], MD;  Location: [place];  Service: Cardiovascular;  Laterality: N/A;   CARDIOVERSION N/A 08/21/2020   Procedure: CARDIOVERSION;  Surgeon: Dorothy Spark, MD;  Location: [facility] ENDOSCOPY;  Service: Cardiovascular;  Laterality: N/A;   CARDIOVERSION N/A 08/29/2020   Procedure: CARDIOVERSION;  Surgeon:  [name], MD;  Location: [place] ENDOSCOPY;  Service: Cardiovascular;  Laterality: N/A;   CARDIOVERSION N/A 09/24/2020   Procedure: CARDIOVERSION;  Surgeon: Freada Bergeron, MD;  Location: [place];  Service: Cardiovascular;  Laterality: N/A;   COLONOSCOPY     CORONARY ANGIOPLASTY WITH STENT PLACEMENT     CORONARY STENT INTERVENTION N/A 11/15/2018   Procedure: CORONARY STENT INTERVENTION;  Surgeon: Burnell Blanks, MD;  Location: [place] [facility];  Service: Cardiovascular;  Laterality: N/A;   LEFT HEART CATH AND CORONARY ANGIOGRAPHY N/A 11/15/2018   Procedure: LEFT HEART CATH AND CORONARY ANGIOGRAPHY;  Surgeon: Burnell Blanks, MD;  Location: [place] [facility];  Service: Cardiovascular;  Laterality: N/A;   LEFT HEART CATH AND CORONARY ANGIOGRAPHY N/A 06/25/2019   Procedure: LEFT HEART CATH AND CORONARY ANGIOGRAPHY;  Surgeon: Martinique, Peter M, MD;  Location: [place] [facility];  Service: Cardiovascular;  Laterality: N/A;   POLYPECTOMY     TEE WITHOUT CARDIOVERSION N/A 04/23/2016   Procedure: TRANSESOPHAGEAL ECHOCARDIOGRAM (TEE);  Surgeon: [name], MD;  Location: [place];  Service: Cardiovascular;  Laterality: N/A;   TEE WITHOUT CARDIOVERSION N/A 08/27/2020   Procedure: TRANSESOPHAGEAL ECHOCARDIOGRAM (TEE);  Surgeon: Freada Bergeron, MD;  Location: [facility] ENDOSCOPY;  Service: Cardiovascular;  Laterality: N/A;   UPPER GASTROINTESTINAL ENDOSCOPY      ROS- all systems are personally reviewed and negatives except as per HPI above  Current Outpatient Medications  Medication Sig Dispense  Refill   acetaminophen (TYLENOL) 500 MG tablet Take 500 mg by mouth every 6 (six) hours as needed for mild pain (or headaches).     atorvastatin (LIPITOR) 20 MG tablet TAKE 1 TABLET BY MOUTH EVERY DAY 90 tablet 3   Cholecalciferol (VITAMIN D-3) 1000 UNITS CAPS Take 1,000 Units by mouth daily.     dofetilide (TIKOSYN) 500 MCG capsule Take 1 capsule (500 mcg total) by mouth 2 (two)  times daily. 180 capsule 3   fluticasone (FLONASE) 50 MCG/ACT nasal spray Place 1 spray into both nostrils daily as needed for allergies (seasonal allergies).     loratadine (CLARITIN) 10 MG tablet Take 10 mg by mouth daily.     Melatonin 3 MG TABS Take 6 mg by mouth at bedtime.     midodrine (PROAMATINE) 5 MG tablet Take 5 mg by mouth See admin instructions. Take 5 mg by mouth in the morning and may take a second 5 mg tablet at lunchtime as needed for low bp     Naphazoline-Pheniramine (ALLERGY EYE OP) Place 1 drop into both eyes daily as needed (allergies).     omeprazole (PRILOSEC) 20 MG capsule Take 20 mg by mouth daily.     polyethylene glycol (MIRALAX / GLYCOLAX) 17 g packet Take 17 g by mouth daily as needed for mild constipation (MIX INTO 8 OUNCES OF WATER AND DRINK).     rivaroxaban (XARELTO) 20 MG TABS tablet TAKE 1 TABLET BY MOUTH DAILY WITH BREAKFAST 90 tablet 1   Wheat Dextrin (BENEFIBER PO) Take 10 BAU/100 mL by mouth daily. powder into 6 ounces of water and drink once a day as needed for mild constipation or to add fiber to the diet     No current facility-administered medications for this visit.    Physical Exam: Vitals:   02/16/21 1528  BP: 116/74  Pulse: 72  SpO2: 97%  Weight: 158 lb (71.7 kg)  Height: 6' (1.829 m)     GEN- The patient is well appearing, alert and oriented x 3 today.   Head- normocephalic, atraumatic Eyes-  Sclera clear, conjunctiva pink Ears- hearing intact Oropharynx- clear Lungs- Clear to ausculation bilaterally, normal work of breathing Heart- Regular rate and rhythm, no murmurs, rubs or gallops, PMI not laterally displaced GI- soft, NT, ND, + BS Extremities- no clubbing, cyanosis, or edema  EKG tracing ordered today is personally reviewed and shows sinus rhythm  Assessment and Plan:  1. Persistent atrial fibrillation Doing well s/p ablation chads2vasc score is 4.  He is on xarelto He wishes to continue tikosyn at this time. He notices  that at times after exercise that his heart rates remain elevated (110s bpm) and gradually return to baseline.  We discussed [name] today as an means for him to better evaluate his rhythm during that time.  2. HL Continue lipitor '20mg'$  daily  3. CAD No ischemic symptoms No changes at this time   Return to see me in 4 months  Thompson Grayer MD, [facility] 02/16/2021 3:29 PM

## 2021-02-16 NOTE — Patient Instructions (Addendum)
Medication Instructions:  Your physician recommends that you continue on your current medications as directed. Please refer to the Current Medication list given to you today.  Labwork: None ordered.  Testing/Procedures: None ordered.  Follow-Up: Your physician wants you to follow-up in: 06/15/21 at 2:15 pm with Thompson Grayer, MD    Any Other Special Instructions Will Be Listed Below (If Applicable).  If you need a refill on your cardiac medications before your next appointment, please call your pharmacy.   [employer]  FDA-cleared EKG at your fingertips. - [employer]   Agricultural engineer, [employer]. [email or web address]   FDA-cleared, clinical grade mobile EKG monitor: Jerry Barr is the most clinically-validated mobile EKG used by the world's leading cardiac care medical professionals.  This may be useful in monitoring palpitations.  We do not have access to have them emailed and reviewed but will be glad to review while in the office.

## 2021-02-24 ENCOUNTER — Ambulatory Visit (INDEPENDENT_AMBULATORY_CARE_PROVIDER_SITE_OTHER): Payer: Medicare Other | Admitting: Internal Medicine

## 2021-02-24 ENCOUNTER — Other Ambulatory Visit: Payer: Self-pay

## 2021-02-24 ENCOUNTER — Encounter: Payer: Self-pay | Admitting: Internal Medicine

## 2021-02-24 VITALS — BP 106/78 | HR 72 | Ht 72.0 in | Wt 156.4 lb

## 2021-02-24 DIAGNOSIS — I2510 Atherosclerotic heart disease of native coronary artery without angina pectoris: Secondary | ICD-10-CM

## 2021-02-24 DIAGNOSIS — I951 Orthostatic hypotension: Secondary | ICD-10-CM | POA: Diagnosis not present

## 2021-02-24 DIAGNOSIS — I4819 Other persistent atrial fibrillation: Secondary | ICD-10-CM

## 2021-02-24 DIAGNOSIS — R002 Palpitations: Secondary | ICD-10-CM | POA: Diagnosis not present

## 2021-02-24 MED ORDER — MIDODRINE HCL 10 MG PO TABS: 10.0000 mg | ORAL_TABLET | Freq: Two times a day (BID) | ORAL | 3 refills | Status: DC

## 2021-02-24 NOTE — Patient Instructions (Signed)
Medication Instructions:  Your physician has recommended you make the following change in your medication:   ** Stop ProAmatine '5mg'$    ** Begin ProAmatine '10mg'$  - 1 tablet by mouth twice daily.  *If you need a refill on your cardiac medications before your next appointment, please call your pharmacy*   Lab Work: None ordered.  If you have labs (blood work) drawn today and your tests are completely normal, you will receive your results only by: [place] (if you have MyChart) OR A paper copy in the mail If you have any lab test that is abnormal or we need to change your treatment, we will call you to review the results.   Testing/Procedures: None ordered.    Follow-Up: At [facility], you and your health needs are our priority.  As part of our continuing mission to provide you with exceptional heart care, we have created designated Provider Care Teams.  These Care Teams include your primary Cardiologist (physician) and Advanced Practice Providers (APPs -  Physician Assistants and Nurse Practitioners) who all work together to provide you with the care you need, when you need it.  We recommend signing up for the patient portal called "MyChart".  Sign up information is provided on this After Visit Summary.  MyChart is used to connect with patients for Virtual Visits (Telemedicine).  Patients are able to view lab/test results, encounter notes, upcoming appointments, etc.  Non-urgent messages can be sent to your provider as well.   To learn more about what you can do with MyChart, go to [email or web address].    Your next appointment:   6 month(s)  The format for your next appointment:   In Person  Provider:    Dr Caryl Comes

## 2021-02-24 NOTE — Progress Notes (Signed)
Patient Care Team: Antony Contras, MD as PCP - General (Family Medicine) Martinique, Peter M, MD as PCP - Cardiology (Cardiology) Deboraha Sprang, MD as PCP - Electrophysiology (Cardiology) Lavonna Monarch, MD as Consulting Physician (Dermatology)   HPI  Jerry Barr is a 80 y.o. male seen in follow-up for atrial fibrillation and orthostatic hypotension with little heart rate response concerning for more systemic autonomic insufficiency.  Improved with discontinuation of amiodarone, but more atrial fibrillation   Underwent AFib ablation 5/22 (JA)  no interval Afib of which he is aware    Still with LH, esp in the am, despite am fluids and proamatine    Prolonged standing still problematic  History of remote coronary artery disease with stenting LAD 2011 circumflex 2020    The patient denies chest pain, shortness of breath, nocturnal dyspnea, orthopnea or peripheral edema.  There have been no palpitations or syncope.  Complains of lightheadedness with standing and post prandial  Constipation and dry mouth.   On Anticoagulant no  bleeding       DATE TEST EF    10/17 Echo   50-55% %    12/18 Echo  45-50%    12/20 LHC   LAD Stents patent  Cx-stent patent; RCA patent    Date Cr K mG Hgb  5/21 1.26 4.6   13.9   4/22  0.96 4.9  2.5  [id number]    Thromboembolic risk factors ( age -66, HTN-1, Vasc disease -1) for a CHADSVASc Score of >=4     Records and Results Reviewed   Past Medical History:  Diagnosis Date   Allergy    Aortic atherosclerosis (HCC)    Atrial fibrillation (HCC)    BPH (benign prostatic hyperplasia)    CAD (coronary artery disease)    Cancer (HCC)    basal cell ca - face, nose and right leg   Clotting disorder (HCC)    a fib hx - Xarelto   Coronary artery disease    Promus drug-eluting stent to a 99% mid LAD, 40-50% narrowing in the distal LAD with mild to moderate disease in the circ and RCA b. 5/6 PCI/DESx1 to mLcx   Dyslipidemia    ED (erectile  dysfunction)    Hearing loss    Hyperplastic colon polyp    Hypoglycemia    Orthostatic hypotension    REM sleep behavior disorder    not tested    Past Surgical History:  Procedure Laterality Date   ATRIAL FIBRILLATION ABLATION N/A 11/18/2020   Procedure: ATRIAL FIBRILLATION ABLATION;  Surgeon: Thompson Grayer, MD;  Location: [place] [facility];  Service: Cardiovascular;  Laterality: N/A;   BUBBLE STUDY  08/27/2020   Procedure: BUBBLE STUDY;  Surgeon: Freada Bergeron, MD;  Location: [place] [facility];  Service: Cardiovascular;;   CARDIOVERSION N/A 04/23/2016   Procedure: CARDIOVERSION;  Surgeon: [name], MD;  Location: [place];  Service: Cardiovascular;  Laterality: N/A;   CARDIOVERSION N/A 08/21/2020   Procedure: CARDIOVERSION;  Surgeon: Dorothy Spark, MD;  Location: [facility];  Service: Cardiovascular;  Laterality: N/A;   CARDIOVERSION N/A 08/29/2020   Procedure: CARDIOVERSION;  Surgeon: Sanda Kenniya Westrich, MD;  Location: [place];  Service: Cardiovascular;  Laterality: N/A;   CARDIOVERSION N/A 09/24/2020   Procedure: CARDIOVERSION;  Surgeon: Freada Bergeron, MD;  Location: [place];  Service: Cardiovascular;  Laterality: N/A;   COLONOSCOPY     CORONARY ANGIOPLASTY WITH STENT PLACEMENT     CORONARY STENT INTERVENTION N/A  11/15/2018   Procedure: CORONARY STENT INTERVENTION;  Surgeon: Burnell Blanks, MD;  Location: [place] [facility];  Service: Cardiovascular;  Laterality: N/A;   LEFT HEART CATH AND CORONARY ANGIOGRAPHY N/A 11/15/2018   Procedure: LEFT HEART CATH AND CORONARY ANGIOGRAPHY;  Surgeon: Burnell Blanks, MD;  Location: [place] [facility];  Service: Cardiovascular;  Laterality: N/A;   LEFT HEART CATH AND CORONARY ANGIOGRAPHY N/A 06/25/2019   Procedure: LEFT HEART CATH AND CORONARY ANGIOGRAPHY;  Surgeon: Martinique, Peter M, MD;  Location: [place] [facility];  Service: Cardiovascular;  Laterality: N/A;   POLYPECTOMY     TEE WITHOUT  CARDIOVERSION N/A 04/23/2016   Procedure: TRANSESOPHAGEAL ECHOCARDIOGRAM (TEE);  Surgeon: [name], MD;  Location: [place];  Service: Cardiovascular;  Laterality: N/A;   TEE WITHOUT CARDIOVERSION N/A 08/27/2020   Procedure: TRANSESOPHAGEAL ECHOCARDIOGRAM (TEE);  Surgeon: Freada Bergeron, MD;  Location: [facility];  Service: Cardiovascular;  Laterality: N/A;   UPPER GASTROINTESTINAL ENDOSCOPY      Current Meds  Medication Sig   acetaminophen (TYLENOL) 500 MG tablet Take 500 mg by mouth every 6 (six) hours as needed for mild pain (or headaches).   atorvastatin (LIPITOR) 20 MG tablet TAKE 1 TABLET BY MOUTH EVERY DAY   Cholecalciferol (VITAMIN D-3) 1000 UNITS CAPS Take 1,000 Units by mouth daily.   dofetilide (TIKOSYN) 500 MCG capsule Take 1 capsule (500 mcg total) by mouth 2 (two) times daily.   fluticasone (FLONASE) 50 MCG/ACT nasal spray Place 1 spray into both nostrils daily as needed for allergies (seasonal allergies).   loratadine (CLARITIN) 10 MG tablet Take 10 mg by mouth daily.   Melatonin 3 MG TABS Take 6 mg by mouth at bedtime.   midodrine (PROAMATINE) 5 MG tablet Take 5 mg by mouth See admin instructions. Take 5 mg by mouth in the morning and may take a second 5 mg tablet at lunchtime as needed for low bp   Naphazoline-Pheniramine (ALLERGY EYE OP) Place 1 drop into both eyes daily as needed (allergies).   polyethylene glycol (MIRALAX / GLYCOLAX) 17 g packet Take 17 g by mouth daily as needed for mild constipation (MIX INTO 8 OUNCES OF WATER AND DRINK).   rivaroxaban (XARELTO) 20 MG TABS tablet TAKE 1 TABLET BY MOUTH DAILY WITH BREAKFAST   Wheat Dextrin (BENEFIBER PO) Take 10 BAU/100 mL by mouth daily. powder into 6 ounces of water and drink once a day as needed for mild constipation or to add fiber to the diet    Allergies  Allergen Reactions   Penicillins Rash    Did it involve swelling of the face/tongue/throat, SOB, or low BP? No Did it involve sudden or severe  rash/hives, skin peeling, or any reaction on the inside of your mouth or nose? No Did you need to seek medical attention at a hospital or doctor's office? No When did it last happen?      50+ years  If all above answers are "NO", may proceed with cephalosporin use.       Review of Systems negative except from HPI and PMH  Physical Exam BP 106/78   Pulse 72   Ht 6' (1.829 m)   Wt 156 lb 6.4 oz (70.9 kg)   SpO2 97%   BMI 21.21 kg/m  Well developed and nourished in no acute distress HENT normal Neck supple with JVP-  flat  Clear Regular rate and rhythm, no murmurs or gallops Abd-soft with active BS No Clubbing cyanosis edema Skin-warm  and dry A & Oriented  Grossly normal sensory and motor function  ECG  sinus  @ 72 17/09/42  Assessment and  Plan Orthostatic intolerance  Coronary artery disease with prior stenting   Atrial fibrillation  High Risk Medication Surveillance dofetilide   Palpitations    No interval atrial fibrillation of which he is aware  continue dofetilide 500 Mcg  bid   surveillance labs normal 4/22  will need to be recheck at [facility] with Dr Shirlee More  No bleeding on Rivaroxaban, reviewed GFR .--55 ml/min>> continue @ 20 mg  Discussed orthostatic hypotension/LH--without HR response concerning for autonomic failure, supported by the observation of constipation.  Will increase proamatine to 10 mg qam,  may benefit from octreotide        Current medicines are reviewed at length with the patient today .  The patient does not  have concerns regarding medicines.

## 2021-03-06 DIAGNOSIS — E785 Hyperlipidemia, unspecified: Secondary | ICD-10-CM | POA: Diagnosis not present

## 2021-03-10 ENCOUNTER — Telehealth: Payer: Self-pay | Admitting: Internal Medicine

## 2021-03-10 DIAGNOSIS — Z0000 Encounter for general adult medical examination without abnormal findings: Secondary | ICD-10-CM | POA: Diagnosis not present

## 2021-03-10 DIAGNOSIS — I2510 Atherosclerotic heart disease of native coronary artery without angina pectoris: Secondary | ICD-10-CM | POA: Diagnosis not present

## 2021-03-10 DIAGNOSIS — I700 Atherosclerosis of aorta: Secondary | ICD-10-CM | POA: Diagnosis not present

## 2021-03-10 DIAGNOSIS — I951 Orthostatic hypotension: Secondary | ICD-10-CM | POA: Diagnosis not present

## 2021-03-10 DIAGNOSIS — Z1389 Encounter for screening for other disorder: Secondary | ICD-10-CM | POA: Diagnosis not present

## 2021-03-10 DIAGNOSIS — I480 Paroxysmal atrial fibrillation: Secondary | ICD-10-CM | POA: Diagnosis not present

## 2021-03-10 DIAGNOSIS — Z8616 Personal history of COVID-19: Secondary | ICD-10-CM | POA: Diagnosis not present

## 2021-03-10 DIAGNOSIS — E7800 Pure hypercholesterolemia, unspecified: Secondary | ICD-10-CM | POA: Diagnosis not present

## 2021-03-10 DIAGNOSIS — D6869 Other thrombophilia: Secondary | ICD-10-CM | POA: Diagnosis not present

## 2021-03-10 DIAGNOSIS — J302 Other seasonal allergic rhinitis: Secondary | ICD-10-CM | POA: Diagnosis not present

## 2021-03-10 DIAGNOSIS — N401 Enlarged prostate with lower urinary tract symptoms: Secondary | ICD-10-CM | POA: Diagnosis not present

## 2021-03-10 NOTE — Telephone Encounter (Signed)
Pt c/o medication issue:  1. Name of Medication:  midodrine (PROAMATINE) 10 MG tablet  2. How are you currently taking this medication (dosage and times per day)? As directed  3. Are you having a reaction (difficulty breathing--STAT)? no  4. What is your medication issue? Pt's dosage was increased from '5mg'$  to '10mg'$  on 02/24/21 by Dr. Caryl Comes, pt has been having Chills, Goose bumps and Nausea daily

## 2021-03-10 NOTE — Telephone Encounter (Signed)
Spoke with the patient who reports that since Dr. Caryl Comes increased his midodrine to 10 mg he has been feeling worse. He reports getting chills and being nauseous. He states nausea has continued to get worse. He states that he had remembered that Dr. Martinique had also tried to increase his dose several years back and he was also unable to tolerate it. He reports that he felt better when he was on the 5 mg and using salt water. He would like to go back to that.

## 2021-03-19 DIAGNOSIS — Z23 Encounter for immunization: Secondary | ICD-10-CM | POA: Diagnosis not present

## 2021-03-19 NOTE — Telephone Encounter (Signed)
Spoke with the patient and advised him it is okay to continue with midodrine '5mg'$  daily.

## 2021-03-30 DIAGNOSIS — I2510 Atherosclerotic heart disease of native coronary artery without angina pectoris: Secondary | ICD-10-CM | POA: Diagnosis not present

## 2021-03-30 DIAGNOSIS — E7800 Pure hypercholesterolemia, unspecified: Secondary | ICD-10-CM | POA: Diagnosis not present

## 2021-03-30 DIAGNOSIS — N401 Enlarged prostate with lower urinary tract symptoms: Secondary | ICD-10-CM | POA: Diagnosis not present

## 2021-03-30 DIAGNOSIS — I480 Paroxysmal atrial fibrillation: Secondary | ICD-10-CM | POA: Diagnosis not present

## 2021-03-30 DIAGNOSIS — E785 Hyperlipidemia, unspecified: Secondary | ICD-10-CM | POA: Diagnosis not present

## 2021-04-13 ENCOUNTER — Other Ambulatory Visit: Payer: Self-pay | Admitting: Cardiology

## 2021-04-24 NOTE — Progress Notes (Signed)
Date:  04/27/2021   ID:  Jerry Barr, DOB 08/17/1940, MRN [id number]   PCP:  Antony Contras, MD  Cardiologist:  Oron Westrup Martinique, MD  Electrophysiologist:  Virl Axe, MD   Evaluation Performed:  Follow-Up Visit  Chief Complaint: shoulder pain.   History of Present Illness:    Jerry Barr is a 80 y.o. male seen for follow up post cardiac cath. He has a  PMH of HLD, chronic hypotension, PAF on Xarelto, CAD s/p DES to LAD 2011 in [place] and BPH. He was diagnosed with new afib in 04/2016. Echo in 04/2016 showed EF 50-55%, no RWMA, mild MR. He had successful TEE DCCV 04/23/2016. He was placed on midodrine for orthostatic hypotension. He is not on any rate control therapy for the same reason. Myoview  In 06/2017 showed EF 55%, no significant ischemia or scar. Echo in 06/2017 EF 45-50%, mild diffuse hypokinesis, PA peak pressure 83mmHg.  Patient was seen in the  ED in April  with recurrent afib and fatigue. Given his history of orthostatic hypotension, it was elected to proceed with cardioversion in the ED. This was performed by Dr. Haroldine Laws. Post cardioversion, his amiodarone was increased to 200mg  BID. After discharge, he was seen in the afib clinic with plans to consider decreasing amiodarone back to 200mg  daily after 1 month if no recurrent afib.   He was seen on 10/30/2018 for post hospital follow-up, at which time he noted  shoulder blade pain that occur with  walking. He had a  cardiac catheterization on 11/15/2018 which revealed 20% proximal RCA, 90% mid left circumflex lesion treated with a 2.25 x 16 mm Synergy DES, EF 55 to 65%.   He has been diagnosed with an REM sleep disorder managed by Dr Maxwell Caul. He was seen in the Afib clinic on 05/16/19 and was maintaining NSR on 200 mg of amiodarone. More recently  he reported recurrent exertional neck and pain in his shoulder blade that resolves with rest. This may occur in the am with initial activity and also with his daily walk. Sometimes  if he continues to walk it goes away after 15 minutes. He also notes increased heartburn in the past 2 weeks. These symptoms are similar to what he had in May and in 2011 with his initial stent. He underwent repeat Cardiac cath on 06/25/19 and this showed only nonobstructive disease with patent stents.   He has since been seen by Dr Caryl Comes for evaluation of his orthostatic hypotension- felt to be related to autonomic failure. Echo was OK. Dr Caryl Comes felt that amiodarone was making symptoms worse and this was discontinued. Event monitor showed AFib burden of 0.6% so AAD therapy was not needed. He was tried on low dose metoprolol but did not tolerate this due to fatigue and indigestion.   He was seen in February 2022 with recurrent and persistent AFib. Had DCCV but early return. He was admitted and started on Tikosyn. Had repeat DCCV on 08/29/20. Had recurrent Afib in March with DCCV. Subsequently had Afib ablation in May. Follow up with EP demonstrated no recurrence. Also seen by Dr Caryl Comes for orthostatic intolerance and midodrine dose increased.   On follow up today he is doing well. He states he did not tolerate the higher dose of midodrin due to nausea. Reduced dose and started drinking more salt water and states he has felt the best the last month and a half than he has felt in 3 years.  Denies any recurrent Afib. No  chest pain or indigestion.   Past Medical History:  Diagnosis Date   Allergy    Aortic atherosclerosis (HCC)    Atrial fibrillation (HCC)    BPH (benign prostatic hyperplasia)    CAD (coronary artery disease)    Cancer (HCC)    basal cell ca - face, nose and right leg   Clotting disorder (HCC)    a fib hx - Xarelto   Coronary artery disease    Promus drug-eluting stent to a 99% mid LAD, 40-50% narrowing in the distal LAD with mild to moderate disease in the circ and RCA b. 5/6 PCI/DESx1 to mLcx   Dyslipidemia    ED (erectile dysfunction)    Hearing loss    Hyperplastic colon polyp     Hypoglycemia    Orthostatic hypotension    REM sleep behavior disorder    not tested   Past Surgical History:  Procedure Laterality Date   ATRIAL FIBRILLATION ABLATION N/A 11/18/2020   Procedure: ATRIAL FIBRILLATION ABLATION;  Surgeon: Thompson Grayer, MD;  Location: [place] [facility];  Service: Cardiovascular;  Laterality: N/A;   BUBBLE STUDY  08/27/2020   Procedure: BUBBLE STUDY;  Surgeon: Freada Bergeron, MD;  Location: [place];  Service: Cardiovascular;;   CARDIOVERSION N/A 04/23/2016   Procedure: CARDIOVERSION;  Surgeon: [name], MD;  Location: [place];  Service: Cardiovascular;  Laterality: N/A;   CARDIOVERSION N/A 08/21/2020   Procedure: CARDIOVERSION;  Surgeon: Dorothy Spark, MD;  Location: [facility] ENDOSCOPY;  Service: Cardiovascular;  Laterality: N/A;   CARDIOVERSION N/A 08/29/2020   Procedure: CARDIOVERSION;  Surgeon: [name], MD;  Location: [place] [facility]COPY;  Service: Cardiovascular;  Laterality: N/A;   CARDIOVERSION N/A 09/24/2020   Procedure: CARDIOVERSION;  Surgeon: Freada Bergeron, MD;  Location: [place];  Service: Cardiovascular;  Laterality: N/A;   COLONOSCOPY     CORONARY ANGIOPLASTY WITH STENT PLACEMENT     CORONARY STENT INTERVENTION N/A 11/15/2018   Procedure: CORONARY STENT INTERVENTION;  Surgeon: Burnell Blanks, MD;  Location: [place] [facility];  Service: Cardiovascular;  Laterality: N/A;   LEFT HEART CATH AND CORONARY ANGIOGRAPHY N/A 11/15/2018   Procedure: LEFT HEART CATH AND CORONARY ANGIOGRAPHY;  Surgeon: Burnell Blanks, MD;  Location: [place] [facility];  Service: Cardiovascular;  Laterality: N/A;   LEFT HEART CATH AND CORONARY ANGIOGRAPHY N/A 06/25/2019   Procedure: LEFT HEART CATH AND CORONARY ANGIOGRAPHY;  Surgeon: Martinique, Kailan Carmen M, MD;  Location: [place] [facility];  Service: Cardiovascular;  Laterality: N/A;   POLYPECTOMY     TEE WITHOUT CARDIOVERSION N/A 04/23/2016   Procedure: TRANSESOPHAGEAL ECHOCARDIOGRAM  (TEE);  Surgeon: [name], MD;  Location: [place];  Service: Cardiovascular;  Laterality: N/A;   TEE WITHOUT CARDIOVERSION N/A 08/27/2020   Procedure: TRANSESOPHAGEAL ECHOCARDIOGRAM (TEE);  Surgeon: Freada Bergeron, MD;  Location: [facility];  Service: Cardiovascular;  Laterality: N/A;   UPPER GASTROINTESTINAL ENDOSCOPY       Current Meds  Medication Sig   acetaminophen (TYLENOL) 500 MG tablet Take 500 mg by mouth every 6 (six) hours as needed for mild pain (or headaches).   atorvastatin (LIPITOR) 20 MG tablet TAKE 1 TABLET BY MOUTH EVERY DAY   Cholecalciferol (VITAMIN D-3) 1000 UNITS CAPS Take 1,000 Units by mouth daily.   dofetilide (TIKOSYN) 500 MCG capsule Take 1 capsule (500 mcg total) by mouth 2 (two) times daily.   fluticasone (FLONASE) 50 MCG/ACT nasal spray Place 1 spray into both nostrils daily as needed for allergies (  seasonal allergies).   loratadine (CLARITIN) 10 MG tablet Take 10 mg by mouth daily.   Melatonin 3 MG TABS Take 6 mg by mouth at bedtime.   midodrine (PROAMATINE) 10 MG tablet Take 1 tablet (10 mg total) by mouth 2 (two) times daily. (Patient taking differently: Take 5 mg by mouth 2 (two) times daily.)   Naphazoline-Pheniramine (ALLERGY EYE OP) Place 1 drop into both eyes daily as needed (allergies).   polyethylene glycol (MIRALAX / GLYCOLAX) 17 g packet Take 17 g by mouth daily as needed for mild constipation (MIX INTO 8 OUNCES OF WATER AND DRINK).   rivaroxaban (XARELTO) 20 MG TABS tablet TAKE 1 TABLET BY MOUTH DAILY WITH BREAKFAST   Wheat Dextrin (BENEFIBER PO) Take 10 BAU/100 mL by mouth daily. powder into 6 ounces of water and drink once a day as needed for mild constipation or to add fiber to the diet     Allergies:   Penicillins   Social History   Tobacco Use   Smoking status: Never   Smokeless tobacco: Never  Vaping Use   Vaping Use: Never used  Substance Use Topics   Alcohol use: Not Currently    Alcohol/week: 4.0 standard drinks     Types: 4 Glasses of wine per week    Comment: 3oz of red wine   Drug use: No     Family Hx: The patient's family history includes Bone cancer in his paternal grandfather; Breast cancer in his mother; CAD in his father; Cancer in his brother; Colon cancer in his brother and mother; Esophageal cancer in his brother; Heart attack in his paternal grandmother; Leukemia in his paternal uncle; Liver cancer in his brother. There is no history of Pancreatic cancer, Prostate cancer, Rectal cancer, or Stomach cancer.  ROS:   Please see the history of present illness.     All other systems reviewed and are negative.   Prior CV studies:   The following studies were reviewed today:  Echo 06/13/17: Study Conclusions   - Left ventricle: The cavity size was normal. Systolic function was   mildly reduced. The estimated ejection fraction was in the range   of 45% to 50%. Mild diffuse hypokinesis. Left ventricular   diastolic function parameters were normal. - Aortic valve: Transvalvular velocity was within the normal range.   There was no stenosis. There was trivial regurgitation. - Mitral valve: There was trivial regurgitation. - Left atrium: The atrium was moderately dilated. - Right ventricle: The cavity size was normal. Wall thickness was   normal. Systolic function was normal. - Right atrium: The atrium was severely dilated. - Atrial septum: No defect or patent foramen ovale was identified   by color flow Doppler. - Tricuspid valve: There was trivial regurgitation. - Pulmonary arteries: Systolic pressure was within the normal   range. PA peak pressure: 24 mm Hg (S).  Cath 11/15/2018 Prox RCA lesion is 20% stenosed. Mid Cx lesion is 90% stenosed. Previously placed Prox LAD to Mid LAD stent (unknown type) is widely patent. A drug-eluting stent was successfully placed using a STENT SYNERGY DES 2.25X16. Post intervention, there is a 0% residual stenosis. The left ventricular systolic function is  normal. LV end diastolic pressure is normal. The left ventricular ejection fraction is 55-65% by visual estimate. There is no mitral valve regurgitation.   1. Patent mid LAD stent without restenosis 2. Severe stenosis mid Circumflex artery.  3. Successful PTCA/DES x 1 mid Circumflex 4. Mild non-obstructive disease in the RCA 5.  Normal LV systolic function   Recommendations: ASA and Plavix for one month then stop ASA. Will restart Xarelto tomorrow. Would recommend at least six month of Plavix and Xarelto but could consider stopping Plavix at 6 months if necessary. Likely same day PCI discharge.    Cardiac cath 06/25/19: Procedures  LEFT HEART CATH AND CORONARY ANGIOGRAPHY  Conclusion    Non-stenotic Prox LAD to Mid LAD lesion was previously treated. Previously placed Mid Cx drug eluting stent is widely patent. Balloon angioplasty was performed. Prox RCA lesion is 20% stenosed. The left ventricular systolic function is normal. LV end diastolic pressure is normal. The left ventricular ejection fraction is 55-65% by visual estimate.   1. Nonobstructive CAD- patent stents in LAD and LCx. 2. Normal LV function 3. Normal LVEDP   Plan: continue medical therapy.      Echo 12/14/19: IMPRESSIONS     1. Left ventricular ejection fraction, by estimation, is 55 to 60%. The  left ventricle has normal function. The left ventricle has no regional  wall motion abnormalities. Left ventricular diastolic parameters were  normal. The average left ventricular  global longitudinal strain is -20.5 %. The global longitudinal strain is  normal.   2. Right ventricular systolic function is normal. The right ventricular  size is normal. There is normal pulmonary artery systolic pressure. The  estimated right ventricular systolic pressure is 51.8 mmHg.   3. The mitral valve is normal in structure. Mild mitral valve  regurgitation. No evidence of mitral stenosis.   4. The aortic valve is normal in  structure. Aortic valve regurgitation is  not visualized. No aortic stenosis is present.   5. The inferior vena cava is normal in size with greater than 50%  respiratory variability, suggesting right atrial pressure of 3 mmHg.    Event monitor 01/01/20: Study Highlights  Indication: palpitations   Duration: 30d   Findings HR  avg 67  Min 51-Max 127  PVCs <1% PACs about 1 % AFib noted < 1% Recommendations Will try low dose BB See note  TEE 08/27/20: IMPRESSIONS     1. Left ventricular ejection fraction, by estimation, is 55 to 60%. The  left ventricle has normal function.   2. Right ventricular systolic function is normal. The right ventricular  size is normal. There is normal pulmonary artery systolic pressure.   3. Left atrial size was moderately dilated. No left atrial/left atrial  appendage thrombus was detected.   4. Right atrial size was mildly dilated.   5. The mitral valve is grossly normal. Mild mitral valve regurgitation.   6. The aortic valve is tricuspid. There is mild thickening of the aortic  valve. Aortic valve regurgitation is not visualized. Mild aortic valve  sclerosis is present, with no evidence of aortic valve stenosis.   7. Agitated saline contrast bubble study was negative, with no evidence  of any interatrial shunt.   Labs/Other Tests and Data Reviewed:     Recent Labs: 08/25/2020: ALT 14; TSH 2.124 09/25/2020: Magnesium 2.5 10/29/2020: BUN 20; Creatinine, Ser 0.96; Hemoglobin 14.5; Platelets 227; Potassium 4.9; Sodium 142   Recent Lipid Panel Lab Results  Component Value Date/Time   CHOL 120 03/23/2019 08:38 AM   TRIG 42 03/23/2019 08:38 AM   HDL 50 03/23/2019 08:38 AM   CHOLHDL 2.4 03/23/2019 08:38 AM   CHOLHDL 3.0 06/29/2016 10:11 AM   LDLCALC 59 03/23/2019 08:38 AM   Dated 02/20/20: choleseterol 128, triglycerides 38, HDL 54, LDL 64. Dated 03/06/21: cholesterol 126,  triglycerides 37, HDL 50, LDL 66. CMET, TSH, CBC normal.    Wt Readings  from Last 3 Encounters:  04/27/21 161 lb 6.4 oz (73.2 kg)  02/24/21 156 lb 6.4 oz (70.9 kg)  02/16/21 158 lb (71.7 kg)     Objective:    Vital Signs:  BP (!) 90/50 (BP Location: Left Arm)   Pulse 86   Ht 6' (1.829 m)   Wt 161 lb 6.4 oz (73.2 kg)   SpO2 98%   BMI 21.89 kg/m    GENERAL:  Well appearing WM in NAD HEENT:  PERRL, EOMI, sclera are clear. Oropharynx is clear. NECK:  No jugular venous distention, carotid upstroke brisk and symmetric, no bruits, no thyromegaly or adenopathy LUNGS:  Clear to auscultation bilaterally CHEST:  Unremarkable HEART:  RRR,  PMI not displaced or sustained,S1 and S2 within normal limits, no S3, no S4: no clicks, no rubs, no murmurs ABD:  Soft, nontender. BS +, no masses or bruits. No hepatomegaly, no splenomegaly EXT:  2 + pulses throughout, no edema, no cyanosis no clubbing. No radial site hematoma SKIN:  Warm and dry.  No rashes NEURO:  Alert and oriented x 3. Cranial nerves II through XII intact. PSYCH:  Cognitively intact    ASSESSMENT & PLAN:    1.   CAD: s/p PCI of the LCx with DES in May 2020.  Prior stent of the mid LAD in 2011. Repeat cardiac cath in Dec 2020 showed nonobstructive disease.curreenlty angina free.  Antianginal therapy limited by orthostatic hypotension. He is asymptomatic now.  2.   PAF: s/p DCCV on October 15, 2019. Recurrent in Feb 2022 and March 2022. Now s/p Afib ablation in May this year. Continue Tikosyn.  Continue Xarelto therapy.  Unable to tolerate  rate control therapy with metoprolol  or amiodarone due to side effects. Maintaining NSR so far.  3.   Chronic orthostatic hypotension/ autonomic failure: intolerant of  higher dose of midodrine. Increased salt water intake. His blood pressure is stable. He reports some this is doing much better and he has learned to manage it.     4.   Hyperlipidemia: Continue current statin. Excellent control.     Disposition:  6 month   Signed, Emmagrace Runkel Martinique, MD  04/27/2021  11:14 AM     [facility]

## 2021-04-27 ENCOUNTER — Encounter: Payer: Self-pay | Admitting: Cardiology

## 2021-04-27 ENCOUNTER — Other Ambulatory Visit: Payer: Self-pay

## 2021-04-27 ENCOUNTER — Ambulatory Visit (INDEPENDENT_AMBULATORY_CARE_PROVIDER_SITE_OTHER): Payer: Medicare Other | Admitting: Cardiology

## 2021-04-27 VITALS — BP 90/50 | HR 86 | Ht 72.0 in | Wt 161.4 lb

## 2021-04-27 DIAGNOSIS — I951 Orthostatic hypotension: Secondary | ICD-10-CM | POA: Diagnosis not present

## 2021-04-27 DIAGNOSIS — I10 Essential (primary) hypertension: Secondary | ICD-10-CM | POA: Diagnosis not present

## 2021-04-27 DIAGNOSIS — I2510 Atherosclerotic heart disease of native coronary artery without angina pectoris: Secondary | ICD-10-CM

## 2021-04-27 DIAGNOSIS — I4819 Other persistent atrial fibrillation: Secondary | ICD-10-CM | POA: Diagnosis not present

## 2021-06-15 ENCOUNTER — Ambulatory Visit (INDEPENDENT_AMBULATORY_CARE_PROVIDER_SITE_OTHER): Payer: Medicare Other | Admitting: Internal Medicine

## 2021-06-15 ENCOUNTER — Other Ambulatory Visit: Payer: Self-pay

## 2021-06-15 ENCOUNTER — Encounter: Payer: Self-pay | Admitting: Internal Medicine

## 2021-06-15 VITALS — BP 116/74 | HR 73 | Ht 72.0 in | Wt 162.2 lb

## 2021-06-15 DIAGNOSIS — I4819 Other persistent atrial fibrillation: Secondary | ICD-10-CM | POA: Diagnosis not present

## 2021-06-15 DIAGNOSIS — I10 Essential (primary) hypertension: Secondary | ICD-10-CM

## 2021-06-15 DIAGNOSIS — I2510 Atherosclerotic heart disease of native coronary artery without angina pectoris: Secondary | ICD-10-CM | POA: Diagnosis not present

## 2021-06-15 NOTE — Patient Instructions (Addendum)
Medication Instructions:  Your physician recommends that you continue on your current medications as directed. Please refer to the Current Medication list given to you today. *If you need a refill on your cardiac medications before your next appointment, please call your pharmacy*  Lab Work: None. If you have labs (blood work) drawn today and your tests are completely normal, you will receive your results only by: [place] (if you have MyChart) OR A paper copy in the mail If you have any lab test that is abnormal or we need to change your treatment, we will call you to review the results.  Testing/Procedures: CBC, BMP, Mag  Follow-Up: At [facility], you and your health needs are our priority.  As part of our continuing mission to provide you with exceptional heart care, we have created designated Provider Care Teams.  These Care Teams include your primary Cardiologist (physician) and Advanced Practice Providers (APPs -  Physician Assistants and Nurse Practitioners) who all work together to provide you with the care you need, when you need it.  Your physician wants you to follow-up in: 6 months with Dr. Caryl Comes.    You will receive a reminder letter in the mail two months in advance. If you don't receive a letter, please call our office to schedule the follow-up appointment.  We recommend signing up for the patient portal called "MyChart".  Sign up information is provided on this After Visit Summary.  MyChart is used to connect with patients for Virtual Visits (Telemedicine).  Patients are able to view lab/test results, encounter notes, upcoming appointments, etc.  Non-urgent messages can be sent to your provider as well.   To learn more about what you can do with MyChart, go to [email or web address].    Any Other Special Instructions Will Be Listed Below (If Applicable).

## 2021-06-15 NOTE — Progress Notes (Signed)
PCP: Antony Contras, MD Primary Cardiologist: Dr Martinique Primary EP: Dr Donita Brooks Held is a 80 y.o. male who presents today for routine electrophysiology followup.  Since last being seen in our clinic, the patient reports doing very well.  Today, he denies symptoms of palpitations, chest pain, shortness of breath,  lower extremity edema, dizziness, presyncope, or syncope.  The patient is otherwise without complaint today.   Past Medical History:  Diagnosis Date   Allergy    Aortic atherosclerosis (HCC)    Atrial fibrillation (HCC)    BPH (benign prostatic hyperplasia)    CAD (coronary artery disease)    Cancer (HCC)    basal cell ca - face, nose and right leg   Clotting disorder (HCC)    a fib hx - Xarelto   Coronary artery disease    Promus drug-eluting stent to a 99% mid LAD, 40-50% narrowing in the distal LAD with mild to moderate disease in the circ and RCA b. 5/6 PCI/DESx1 to mLcx   Dyslipidemia    ED (erectile dysfunction)    Hearing loss    Hyperplastic colon polyp    Hypoglycemia    Orthostatic hypotension    REM sleep behavior disorder    not tested   Past Surgical History:  Procedure Laterality Date   ATRIAL FIBRILLATION ABLATION N/A 11/18/2020   Procedure: ATRIAL FIBRILLATION ABLATION;  Surgeon: Thompson Grayer, MD;  Location: [place] CV LAB;  Service: Cardiovascular;  Laterality: N/A;   BUBBLE STUDY  08/27/2020   Procedure: BUBBLE STUDY;  Surgeon: Freada Bergeron, MD;  Location: [place];  Service: Cardiovascular;;   CARDIOVERSION N/A 04/23/2016   Procedure: CARDIOVERSION;  Surgeon: [name], MD;  Location: [place];  Service: Cardiovascular;  Laterality: N/A;   CARDIOVERSION N/A 08/21/2020   Procedure: CARDIOVERSION;  Surgeon: Dorothy Spark, MD;  Location: [facility] ENDOSCOPY;  Service: Cardiovascular;  Laterality: N/A;   CARDIOVERSION N/A 08/29/2020   Procedure: CARDIOVERSION;  Surgeon: [name], MD;  Location: [place] ENDOSCOPY;  Service:  Cardiovascular;  Laterality: N/A;   CARDIOVERSION N/A 09/24/2020   Procedure: CARDIOVERSION;  Surgeon: Freada Bergeron, MD;  Location: [place];  Service: Cardiovascular;  Laterality: N/A;   COLONOSCOPY     CORONARY ANGIOPLASTY WITH STENT PLACEMENT     CORONARY STENT INTERVENTION N/A 11/15/2018   Procedure: CORONARY STENT INTERVENTION;  Surgeon: Burnell Blanks, MD;  Location: [place] CV LAB;  Service: Cardiovascular;  Laterality: N/A;   LEFT HEART CATH AND CORONARY ANGIOGRAPHY N/A 11/15/2018   Procedure: LEFT HEART CATH AND CORONARY ANGIOGRAPHY;  Surgeon: Burnell Blanks, MD;  Location: [place] CV LAB;  Service: Cardiovascular;  Laterality: N/A;   LEFT HEART CATH AND CORONARY ANGIOGRAPHY N/A 06/25/2019   Procedure: LEFT HEART CATH AND CORONARY ANGIOGRAPHY;  Surgeon: Martinique, Peter M, MD;  Location: [place] CV LAB;  Service: Cardiovascular;  Laterality: N/A;   POLYPECTOMY     TEE WITHOUT CARDIOVERSION N/A 04/23/2016   Procedure: TRANSESOPHAGEAL ECHOCARDIOGRAM (TEE);  Surgeon: [name], MD;  Location: [place];  Service: Cardiovascular;  Laterality: N/A;   TEE WITHOUT CARDIOVERSION N/A 08/27/2020   Procedure: TRANSESOPHAGEAL ECHOCARDIOGRAM (TEE);  Surgeon: Freada Bergeron, MD;  Location: [facility] ENDOSCOPY;  Service: Cardiovascular;  Laterality: N/A;   UPPER GASTROINTESTINAL ENDOSCOPY      ROS- all systems are reviewed and negatives except as per HPI above  Current Outpatient Medications  Medication Sig Dispense Refill   acetaminophen (TYLENOL) 500 MG tablet Take 500  mg by mouth every 6 (six) hours as needed for mild pain (or headaches).     atorvastatin (LIPITOR) 20 MG tablet TAKE 1 TABLET BY MOUTH EVERY DAY 90 tablet 3   Cholecalciferol (VITAMIN D-3) 1000 UNITS CAPS Take 1,000 Units by mouth daily.     dofetilide (TIKOSYN) 500 MCG capsule Take 1 capsule (500 mcg total) by mouth 2 (two) times daily. 180 capsule 3   famotidine (PEPCID) 20 MG tablet Take  20 mg by mouth as needed for heartburn or indigestion.     fluticasone (FLONASE) 50 MCG/ACT nasal spray Place 1 spray into both nostrils daily as needed for allergies (seasonal allergies).     loratadine (CLARITIN) 10 MG tablet Take 10 mg by mouth daily.     Melatonin 3 MG TABS Take 6 mg by mouth at bedtime.     midodrine (PROAMATINE) 10 MG tablet Take 1 tablet (10 mg total) by mouth 2 (two) times daily. [name] 180 tablet 3   Naphazoline-Pheniramine (ALLERGY EYE OP) Place 1 drop into both eyes daily as needed (allergies).     polyethylene glycol (MIRALAX / GLYCOLAX) 17 g packet Take 17 g by mouth daily as needed for mild constipation (MIX INTO 8 OUNCES OF WATER AND DRINK).     rivaroxaban (XARELTO) 20 MG TABS tablet TAKE 1 TABLET BY MOUTH DAILY WITH BREAKFAST 90 tablet 1   Wheat Dextrin (BENEFIBER PO) Take 10 BAU/100 mL by mouth daily. powder into 6 ounces of water and drink once a day as needed for mild constipation or to add fiber to the diet     omeprazole (PRILOSEC) 20 MG capsule Take 20 mg by mouth daily. (Patient not taking: Reported on 02/24/2021)     No current facility-administered medications for this visit.    Physical Exam: Vitals:   06/15/21 1419  BP: 116/74  Pulse: 73  SpO2: 98%  Weight: 162 lb 3.2 oz (73.6 kg)  Height: 6' (1.829 m)    GEN- The patient is well appearing, alert and oriented x 3 today.   Head- normocephalic, atraumatic Eyes-  Sclera clear, conjunctiva pink Ears- hearing intact Oropharynx- clear Lungs- Clear to ausculation bilaterally, normal work of breathing Heart- Regular rate and rhythm, no murmurs, rubs or gallops, PMI not laterally displaced GI- soft, NT, ND, + BS Extremities- no clubbing, cyanosis, or edema  Wt Readings from Last 3 Encounters:  06/15/21 162 lb 3.2 oz (73.6 kg)  04/27/21 161 lb 6.4 oz (73.2 kg)  02/24/21 156 lb 6.4 oz (70.9 kg)    EKG tracing ordered  today is personally reviewed and shows sinus, QTc 460 msec  Assessment and Plan:  Persistent atrial fibrillation Doing well s/p ablation Chads2vasc score is 4.  He is on xarelto He wishes to continue tikosyn.  Labs 4/22 reviewed Obtain CBC, Bmet and mg today   2. CAD s/p PCI No ischemic symptoms No changes  3. HL Continue lipitor 20mg  daily  4. Orthostasis Follows with Dr Caryl Comes  Risks, benefits and potential toxicities for medications prescribed and/or refilled reviewed with patient today.   Follow-up with Dr Caryl Comes as scheduled I will see as needed going forward  Thompson Grayer MD, [facility] 06/15/2021 2:25 PM

## 2021-06-16 LAB — CBC
Hematocrit: 42.3 % (ref 37.5–51.0)
Hemoglobin: 13.9 g/dL (ref 13.0–17.7)
MCH: 29.8 pg (ref 26.6–33.0)
MCHC: 32.9 g/dL (ref 31.5–35.7)
MCV: 91 fL (ref 79–97)
Platelets: 221 10*3/uL (ref 150–450)
RBC: 4.66 x10E6/uL (ref 4.14–5.80)
RDW: 13.2 % (ref 11.6–15.4)
WBC: 7.3 10*3/uL (ref 3.4–10.8)

## 2021-06-16 LAB — BASIC METABOLIC PANEL
BUN/Creatinine Ratio: 23 (ref 10–24)
BUN: 23 mg/dL (ref 8–27)
CO2: 24 mmol/L (ref 20–29)
Calcium: 9.1 mg/dL (ref 8.6–10.2)
Chloride: 105 mmol/L (ref 96–106)
Creatinine, Ser: 1.01 mg/dL (ref 0.76–1.27)
Glucose: 85 mg/dL (ref 70–99)
Potassium: 4.4 mmol/L (ref 3.5–5.2)
Sodium: 141 mmol/L (ref 134–144)
eGFR: 76 mL/min/{1.73_m2} (ref >59)

## 2021-06-16 LAB — MAGNESIUM: Magnesium: 2.5 mg/dL — ABNORMAL HIGH (ref 1.6–2.3)

## 2021-06-23 DIAGNOSIS — G4752 REM sleep behavior disorder: Secondary | ICD-10-CM | POA: Diagnosis not present

## 2021-07-30 ENCOUNTER — Other Ambulatory Visit: Payer: Self-pay | Admitting: Adult Health

## 2021-08-15 ENCOUNTER — Other Ambulatory Visit: Payer: Self-pay | Admitting: Cardiology

## 2021-08-17 NOTE — Telephone Encounter (Signed)
Prescription refill request for Xarelto received.  Indication:Afib Last office visit:12/22 Weight:73.6 kg Age:81 Scr:1.0 CrCl:61.33 ml/min  Prescription refilled

## 2021-08-19 ENCOUNTER — Telehealth: Payer: Self-pay | Admitting: Cardiology

## 2021-08-19 ENCOUNTER — Other Ambulatory Visit: Payer: Self-pay | Admitting: Internal Medicine

## 2021-08-19 MED ORDER — DOFETILIDE 500 MCG PO CAPS: 500.0000 ug | ORAL_CAPSULE | Freq: Two times a day (BID) | ORAL | 0 refills | Status: DC

## 2021-08-19 NOTE — Telephone Encounter (Signed)
Medication has been filled 

## 2021-08-19 NOTE — Telephone Encounter (Signed)
°*  STAT* If patient is at the pharmacy, call can be transferred to refill team.   1. Which medications need to be refilled? (please list name of each medication and dose if known) dofetilide (TIKOSYN) 500 MCG capsule  2. Which pharmacy/location (including street and city if local pharmacy) is medication to be sent to? [facility] [id number] - [place], [place]  3. Do they need a 30 day or 90 day supply? [place]

## 2021-10-24 NOTE — Progress Notes (Signed)
? ? ?Date:  10/29/2021  ? ?ID:  Jerry Barr, DOB November 01, 1940, MRN [id number] ? ? ?PCP:  Antony Contras, MD  ?Cardiologist:  Lydiann Bonifas Martinique, MD  ?Electrophysiologist:  Virl Axe, MD  ? ?Evaluation Performed:  Follow-Up Visit ? ?Chief Complaint: shoulder pain.  ? ?History of Present Illness:   ? ?Jerry Barr is a 81 y.o. male seen for follow up post cardiac cath. He has a  PMH of HLD, chronic hypotension, PAF on Xarelto, CAD s/p DES to LAD 2011 in [place] and BPH. He was diagnosed with new afib in 04/2016. Echo in 04/2016 showed EF 50-55%, no RWMA, mild MR. He had successful TEE DCCV 04/23/2016. He was placed on midodrine for orthostatic hypotension. He is not on any rate control therapy for the same reason. Myoview  In 06/2017 showed EF 55%, no significant ischemia or scar. Echo in 06/2017 EF 45-50%, mild diffuse hypokinesis, PA peak pressure 68mHg. ? ?Patient was seen in the  ED in April  with recurrent afib and fatigue. Given his history of orthostatic hypotension, it was elected to proceed with cardioversion in the ED. This was performed by Dr. BHaroldine Laws Post cardioversion, his amiodarone was increased to '200mg'$  BID. After discharge, he was seen in the afib clinic with plans to consider decreasing amiodarone back to '200mg'$  daily after 1 month if no recurrent afib.   He was seen on 10/30/2018 for post hospital follow-up, at which time he noted  shoulder blade pain that occur with  walking. He had a  cardiac catheterization on 11/15/2018 which revealed 20% proximal RCA, 90% mid left circumflex lesion treated with a 2.25 x 16 mm Synergy DES, EF 55 to 65%.  ? ?He has been diagnosed with an REM sleep disorder managed by Dr OMaxwell Caul He was seen in the Afib clinic on 05/16/19 and was maintaining NSR on 200 mg of amiodarone. More recently  he reported recurrent exertional neck and pain in his shoulder blade that resolves with rest. This may occur in the am with initial activity and also with his daily walk. Sometimes  if he continues to walk it goes away after 15 minutes. He also notes increased heartburn in the past 2 weeks. These symptoms are similar to what he had in May and in 2011 with his initial stent. He underwent repeat Cardiac cath on 06/25/19 and this showed only nonobstructive disease with patent stents.  ? ?He has since been seen by Dr KCaryl Comesfor evaluation of his orthostatic hypotension- felt to be related to autonomic failure. Echo was OK. Dr KCaryl Comesfelt that amiodarone was making symptoms worse and this was discontinued. Event monitor showed AFib burden of 0.6% so AAD therapy was not needed. He was tried on low dose metoprolol but did not tolerate this due to fatigue and indigestion.  ? ?He was seen in February 2022 with recurrent and persistent AFib. Had DCCV but early return. He was admitted and started on Tikosyn. Had repeat DCCV on 08/29/20. Had recurrent Afib in March with DCCV. Subsequently had Afib ablation in May. Follow up with EP demonstrated no recurrence. Also seen by Dr KCaryl Comesfor orthostatic intolerance and midodrine dose increased.  ? ?On follow up today he is doing well. His orthostasis is doing well. Has learned to manage it with staying upright, wearing compression hose, and using salt. Denies any recurrent Afib since ablation. No chest pain or indigestion. He is walking regularly. [place] ? ?  Past Medical History:  ?Diagnosis Date  ? Allergy   ? Aortic atherosclerosis ([place])   ? Atrial fibrillation ([place])   ? BPH (benign prostatic hyperplasia)   ? CAD (coronary artery disease)   ? Cancer [facility])   ? basal cell ca - face, nose and right leg  ? Clotting disorder ([place])   ? a fib hx - Xarelto  ? Coronary artery disease   ? Promus drug-eluting stent to a 99% mid LAD, 40-50% narrowing in the distal LAD with mild to moderate disease in the circ and RCA b. 5/6 PCI/DESx1 to mLcx  ? Dyslipidemia   ? ED (erectile dysfunction)   ? Hearing  loss   ? Hyperplastic colon polyp   ? Hypoglycemia   ? Orthostatic hypotension   ? REM sleep behavior disorder   ? not tested  ? ?Past Surgical History:  ?Procedure Laterality Date  ? ATRIAL FIBRILLATION ABLATION N/A 11/18/2020  ? Procedure: ATRIAL FIBRILLATION ABLATION;  Surgeon: Thompson Grayer, MD;  Location: [place] CV LAB;  Service: Cardiovascular;  Laterality: N/A;  ? BUBBLE STUDY  08/27/2020  ? Procedure: BUBBLE STUDY;  Surgeon: Freada Bergeron, MD;  Location: [place];  Service: Cardiovascular;;  ? CARDIOVERSION N/A 04/23/2016  ? Procedure: CARDIOVERSION;  Surgeon: [name], MD;  Location: [place];  Service: Cardiovascular;  Laterality: N/A;  ? CARDIOVERSION N/A 08/21/2020  ? Procedure: CARDIOVERSION;  Surgeon: Dorothy Spark, MD;  Location: [place];  Service: Cardiovascular;  Laterality: N/A;  ? CARDIOVERSION N/A 08/29/2020  ? Procedure: CARDIOVERSION;  Surgeon: [name], MD;  Location: [place];  Service: Cardiovascular;  Laterality: N/A;  ? CARDIOVERSION N/A 09/24/2020  ? Procedure: CARDIOVERSION;  Surgeon: Freada Bergeron, MD;  Location: [facility] ENDOSCOPY;  Service: Cardiovascular;  Laterality: N/A;  ? COLONOSCOPY    ? CORONARY ANGIOPLASTY WITH STENT PLACEMENT    ? CORONARY STENT INTERVENTION N/A 11/15/2018  ? Procedure: CORONARY STENT INTERVENTION;  Surgeon: Burnell Blanks, MD;  Location: [place] CV LAB;  Service: Cardiovascular;  Laterality: N/A;  ? LEFT HEART CATH AND CORONARY ANGIOGRAPHY N/A 11/15/2018  ? Procedure: LEFT HEART CATH AND CORONARY ANGIOGRAPHY;  Surgeon: Burnell Blanks, MD;  Location: [place] CV LAB;  Service: Cardiovascular;  Laterality: N/A;  ? LEFT HEART CATH AND CORONARY ANGIOGRAPHY N/A 06/25/2019  ? Procedure: LEFT HEART CATH AND CORONARY ANGIOGRAPHY;  Surgeon: Martinique, Maryruth Apple M, MD;  Location: [place] CV LAB;  Service: Cardiovascular;  Laterality: N/A;  ? POLYPECTOMY    ? TEE WITHOUT CARDIOVERSION N/A 04/23/2016  ?  Procedure: TRANSESOPHAGEAL ECHOCARDIOGRAM (TEE);  Surgeon: [name], MD;  Location: [place];  Service: Cardiovascular;  Laterality: N/A;  ? TEE WITHOUT CARDIOVERSION N/A 08/27/2020  ? Procedure: TRANSESOPHAGEAL ECHOCARDIOGRAM (TEE);  Surgeon: Freada Bergeron, MD;  Location: [place];  Service: Cardiovascular;  Laterality: N/A;  ? UPPER GASTROINTESTINAL ENDOSCOPY    ?  ? ?Current Meds  ?Medication Sig  ? acetaminophen (TYLENOL) 500 MG tablet Take 500 mg by mouth every 6 (six) hours as needed for mild pain (or headaches).  ? atorvastatin (LIPITOR) 20 MG tablet TAKE 1 TABLET BY MOUTH EVERY DAY  ? Cholecalciferol (VITAMIN D-3) 1000 UNITS CAPS Take 1,000 Units by mouth daily.  ? dofetilide (TIKOSYN) 500 MCG capsule Take 1 capsule (500 mcg total) by mouth 2 (two) times daily.  ? famotidine (PEPCID) 20 MG tablet Take 20 mg by mouth as needed for heartburn or indigestion.  ? fluticasone (FLONASE) 50 MCG/ACT nasal  spray Place 1 spray into both nostrils daily as needed for allergies (seasonal allergies).  ? loratadine (CLARITIN) 10 MG tablet Take 10 mg by mouth daily.  ? Melatonin 3 MG TABS Take 6 mg by mouth at bedtime.  ? midodrine (PROAMATINE) 10 MG tablet Take 1 tablet (10 mg total) by mouth 2 (two) times daily. [name]  ? Naphazoline-Pheniramine (ALLERGY EYE OP) Place 1 drop into both eyes daily as needed (allergies).  ? omeprazole (PRILOSEC) 20 MG capsule Take 20 mg by mouth daily.  ? polyethylene glycol (MIRALAX / GLYCOLAX) 17 g packet Take 17 g by mouth daily as needed for mild constipation (MIX INTO 8 OUNCES OF WATER AND DRINK).  ? Wheat Dextrin (BENEFIBER PO) Take 10 BAU/100 mL by mouth daily. powder into 6 ounces of water and drink once a day as needed for mild constipation or to add fiber to the diet  ? XARELTO 20 MG TABS tablet TAKE 1 TABLET BY MOUTH EVERY DAY WITH BREAKFAST  ?  ? ?Allergies:   Amiodarone, Metoprolol, and  Penicillins  ? ?Social History  ? ?Tobacco Use  ? Smoking status: Never  ? Smokeless tobacco: Never  ?Vaping Use  ? Vaping Use: Never used  ?Substance Use Topics  ? Alcohol use: Not Currently  ?  Alcohol/week:

## 2021-10-29 ENCOUNTER — Encounter: Payer: Self-pay | Admitting: Cardiology

## 2021-10-29 ENCOUNTER — Ambulatory Visit (INDEPENDENT_AMBULATORY_CARE_PROVIDER_SITE_OTHER): Payer: Medicare Other | Admitting: Cardiology

## 2021-10-29 VITALS — BP 106/74 | HR 83 | Ht 72.0 in | Wt 157.2 lb

## 2021-10-29 DIAGNOSIS — I951 Orthostatic hypotension: Secondary | ICD-10-CM | POA: Diagnosis not present

## 2021-10-29 DIAGNOSIS — E7800 Pure hypercholesterolemia, unspecified: Secondary | ICD-10-CM | POA: Diagnosis not present

## 2021-10-29 DIAGNOSIS — I2510 Atherosclerotic heart disease of native coronary artery without angina pectoris: Secondary | ICD-10-CM

## 2021-10-29 DIAGNOSIS — I4819 Other persistent atrial fibrillation: Secondary | ICD-10-CM

## 2021-12-15 ENCOUNTER — Encounter: Payer: Self-pay | Admitting: Internal Medicine

## 2021-12-15 ENCOUNTER — Ambulatory Visit (INDEPENDENT_AMBULATORY_CARE_PROVIDER_SITE_OTHER): Payer: Medicare Other | Admitting: Internal Medicine

## 2021-12-15 VITALS — Ht 72.0 in | Wt 158.4 lb

## 2021-12-15 DIAGNOSIS — I2510 Atherosclerotic heart disease of native coronary artery without angina pectoris: Secondary | ICD-10-CM | POA: Diagnosis not present

## 2021-12-15 DIAGNOSIS — R002 Palpitations: Secondary | ICD-10-CM | POA: Diagnosis not present

## 2021-12-15 DIAGNOSIS — I4819 Other persistent atrial fibrillation: Secondary | ICD-10-CM

## 2021-12-15 DIAGNOSIS — I951 Orthostatic hypotension: Secondary | ICD-10-CM

## 2021-12-15 NOTE — Patient Instructions (Signed)
Medication Instructions:  Your physician recommends that you continue on your current medications as directed. Please refer to the Current Medication list given to you today.  *If you need a refill on your cardiac medications before your next appointment, please call your pharmacy*   Lab Work: CBC and BMET and Mg today  If you have labs (blood work) drawn today and your tests are completely normal, you will receive your results only by: [place] (if you have MyChart) OR A paper copy in the mail If you have any lab test that is abnormal or we need to change your treatment, we will call you to review the results.   Testing/Procedures: None ordered.    Follow-Up: At [facility], you and your health needs are our priority.  As part of our continuing mission to provide you with exceptional heart care, we have created designated Provider Care Teams.  These Care Teams include your primary Cardiologist (physician) and Advanced Practice Providers (APPs -  Physician Assistants and Nurse Practitioners) who all work together to provide you with the care you need, when you need it.  We recommend signing up for the patient portal called "MyChart".  Sign up information is provided on this After Visit Summary.  MyChart is used to connect with patients for Virtual Visits (Telemedicine).  Patients are able to view lab/test results, encounter notes, upcoming appointments, etc.  Non-urgent messages can be sent to your provider as well.   To learn more about what you can do with MyChart, go to [email or web address].    Your next appointment:   6 months with Dr Caryl Comes  Important Information About Sugar

## 2021-12-15 NOTE — Progress Notes (Signed)
Patient Care Team: Antony Contras, MD as PCP - General (Family Medicine) Martinique, Peter M, MD as PCP - Cardiology (Cardiology) Deboraha Sprang, MD as PCP - Electrophysiology (Cardiology) Lavonna Monarch, MD as Consulting Physician (Dermatology)   HPI  Jerry Barr is a 81 y.o. male seen in follow-up for atrial fibrillation and orthostatic hypotension with little heart rate response concerning for more systemic autonomic insufficiency.  Improved with discontinuation of amiodarone, but more atrial fibrillation   Underwent AFib ablation 5/22 (JA)  no interval atrial fib of which he is aware   continues on dofetilide and Rivaroxaban     History of remote coronary artery disease with stenting LAD 2011 circumflex 2020    The patient denies chest pain, shortness of breath, nocturnal dyspnea, orthopnea or peripheral edema.  There have been no palpitations, lightheadedness or syncope.      DATE TEST EF    10/17 Echo   50-55% %    12/18 Echo  45-50%    12/20 LHC   LAD Stents patent  Cx-stent patent; RCA patent    Date Cr K mG Hgb  5/21 1.26 4.6   13.9   4/22  0.96 4.9  2.5  14.7    Thromboembolic risk factors ( age -44, HTN-1, Vasc disease -1) for a CHADSVASc Score of >=4     Records and Results Reviewed   Past Medical History:  Diagnosis Date   Allergy    Aortic atherosclerosis (HCC)    Atrial fibrillation (HCC)    BPH (benign prostatic hyperplasia)    CAD (coronary artery disease)    Cancer (HCC)    basal cell ca - face, nose and right leg   Clotting disorder (HCC)    a fib hx - Xarelto   Coronary artery disease    Promus drug-eluting stent to a 99% mid LAD, 40-50% narrowing in the distal LAD with mild to moderate disease in the circ and RCA b. 5/6 PCI/DESx1 to mLcx   Dyslipidemia    ED (erectile dysfunction)    Hearing loss    Hyperplastic colon polyp    Hypoglycemia    Orthostatic hypotension    REM sleep behavior disorder    not tested    Past Surgical  History:  Procedure Laterality Date   ATRIAL FIBRILLATION ABLATION N/A 11/18/2020   Procedure: ATRIAL FIBRILLATION ABLATION;  Surgeon: Thompson Grayer, MD;  Location: [place] [facility];  Service: Cardiovascular;  Laterality: N/A;   BUBBLE STUDY  08/27/2020   Procedure: BUBBLE STUDY;  Surgeon: Freada Bergeron, MD;  Location: [place];  Service: Cardiovascular;;   CARDIOVERSION N/A 04/23/2016   Procedure: CARDIOVERSION;  Surgeon: [name], MD;  Location: [place];  Service: Cardiovascular;  Laterality: N/A;   CARDIOVERSION N/A 08/21/2020   Procedure: CARDIOVERSION;  Surgeon: Dorothy Spark, MD;  Location: [facility];  Service: Cardiovascular;  Laterality: N/A;   CARDIOVERSION N/A 08/29/2020   Procedure: CARDIOVERSION;  Surgeon: Sanda Nurah Petrides, MD;  Location: [place] [facility];  Service: Cardiovascular;  Laterality: N/A;   CARDIOVERSION N/A 09/24/2020   Procedure: CARDIOVERSION;  Surgeon: Freada Bergeron, MD;  Location: [place];  Service: Cardiovascular;  Laterality: N/A;   COLONOSCOPY     CORONARY ANGIOPLASTY WITH STENT PLACEMENT     CORONARY STENT INTERVENTION N/A 11/15/2018   Procedure: CORONARY STENT INTERVENTION;  Surgeon: Burnell Blanks, MD;  Location: [place] [facility];  Service: Cardiovascular;  Laterality: N/A;   LEFT HEART CATH AND CORONARY  ANGIOGRAPHY N/A 11/15/2018   Procedure: LEFT HEART CATH AND CORONARY ANGIOGRAPHY;  Surgeon: Burnell Blanks, MD;  Location: [place] [facility];  Service: Cardiovascular;  Laterality: N/A;   LEFT HEART CATH AND CORONARY ANGIOGRAPHY N/A 06/25/2019   Procedure: LEFT HEART CATH AND CORONARY ANGIOGRAPHY;  Surgeon: Martinique, Peter M, MD;  Location: [place] [facility];  Service: Cardiovascular;  Laterality: N/A;   POLYPECTOMY     TEE WITHOUT CARDIOVERSION N/A 04/23/2016   Procedure: TRANSESOPHAGEAL ECHOCARDIOGRAM (TEE);  Surgeon: [name], MD;  Location: [place];  Service: Cardiovascular;  Laterality: N/A;    TEE WITHOUT CARDIOVERSION N/A 08/27/2020   Procedure: TRANSESOPHAGEAL ECHOCARDIOGRAM (TEE);  Surgeon: Freada Bergeron, MD;  Location: [facility];  Service: Cardiovascular;  Laterality: N/A;   UPPER GASTROINTESTINAL ENDOSCOPY      Current Meds  Medication Sig   acetaminophen (TYLENOL) 500 MG tablet Take 500 mg by mouth every 6 (six) hours as needed for mild pain (or headaches).   atorvastatin (LIPITOR) 20 MG tablet TAKE 1 TABLET BY MOUTH EVERY DAY   Cholecalciferol (VITAMIN D-3) 1000 UNITS CAPS Take 1,000 Units by mouth daily.   dofetilide (TIKOSYN) 500 MCG capsule Take 1 capsule (500 mcg total) by mouth 2 (two) times daily.   famotidine (PEPCID) 20 MG tablet Take 20 mg by mouth as needed for heartburn or indigestion.   fluticasone (FLONASE) 50 MCG/ACT nasal spray Place 1 spray into both nostrils daily as needed for allergies (seasonal allergies).   loratadine (CLARITIN) 10 MG tablet Take 10 mg by mouth daily.   Melatonin 3 MG TABS Take 6 mg by mouth at bedtime.   midodrine (PROAMATINE) 10 MG tablet Take 1 tablet (10 mg total) by mouth 2 (two) times daily. [name]   Naphazoline-Pheniramine (ALLERGY EYE OP) Place 1 drop into both eyes daily as needed (allergies).   polyethylene glycol (MIRALAX / GLYCOLAX) 17 g packet Take 17 g by mouth daily as needed for mild constipation (MIX INTO 8 OUNCES OF WATER AND DRINK).   Wheat Dextrin (BENEFIBER PO) Take 10 BAU/100 mL by mouth daily. powder into 6 ounces of water and drink once a day as needed for mild constipation or to add fiber to the diet   XARELTO 20 MG TABS tablet TAKE 1 TABLET BY MOUTH EVERY DAY WITH BREAKFAST    Allergies  Allergen Reactions   Amiodarone Other (See Comments)   Metoprolol     Other reaction(s): fatigue   Penicillins Rash    Did it involve swelling of the face/tongue/throat, SOB, or low BP? No Did it involve sudden or severe rash/hives, skin  peeling, or any reaction on the inside of your mouth or nose? No Did you need to seek medical attention at a hospital or doctor's office? No When did it last happen?      50+ years  If all above answers are "NO", may proceed with cephalosporin use.       Review of Systems negative except from HPI and PMH  Physical Exam Ht 6' (1.829 m)   Wt 158 lb 6.4 oz (71.8 kg)   SpO2 96%   BMI 21.48 kg/m  Well developed and well nourished in no acute distress HENT normal Neck supple with JVP-flat Clear Device pocket well healed; without hematoma or erythema.  There is no tethering  Regular rate and rhythm, no  murmur Abd-soft with active BS No Clubbing cyanosis  edema Skin-warm  and dry A & Oriented  Grossly normal sensory and motor function  ECG sinus  '@73'$  15/09/42 PVC LBBB inferior    Assessment and  Plan Orthostatic intolerance  Coronary artery disease with prior stenting   Atrial fibrillation  High Risk Medication Surveillance dofetilide   Palpitations   No interval AFib   continue dofetilide and Rivaroxaban   will check surveillance labs; QT ok  Orthostasis is impressive and he has surprisingly few symptoms and manages it with compression salt an prn proamatine  Without symptoms of ischemia.  Continue atorvastatin 20   conit   He would like to switch to Apixaban  which we willdo when his refill comes due        Current medicines are reviewed at length with the patient today .  The patient does not  have concerns regarding medicines.

## 2021-12-16 LAB — CBC
Hematocrit: 43.9 % (ref 37.5–51.0)
Hemoglobin: 14.5 g/dL (ref 13.0–17.7)
MCH: 30.3 pg (ref 26.6–33.0)
MCHC: 33 g/dL (ref 31.5–35.7)
MCV: 92 fL (ref 79–97)
Platelets: 228 10*3/uL (ref 150–450)
RBC: 4.78 x10E6/uL (ref 4.14–5.80)
RDW: 13.6 % (ref 11.6–15.4)
WBC: 8 10*3/uL (ref 3.4–10.8)

## 2021-12-16 LAB — BASIC METABOLIC PANEL
BUN/Creatinine Ratio: 28 — ABNORMAL HIGH (ref 10–24)
BUN: 27 mg/dL (ref 8–27)
CO2: 25 mmol/L (ref 20–29)
Calcium: 9.1 mg/dL (ref 8.6–10.2)
Chloride: 104 mmol/L (ref 96–106)
Creatinine, Ser: 0.98 mg/dL (ref 0.76–1.27)
Glucose: 94 mg/dL (ref 70–99)
Potassium: 4.7 mmol/L (ref 3.5–5.2)
Sodium: 142 mmol/L (ref 134–144)
eGFR: 78 mL/min/{1.73_m2} (ref >59)

## 2021-12-16 LAB — MAGNESIUM: Magnesium: 2.3 mg/dL (ref 1.6–2.3)

## 2022-01-04 DIAGNOSIS — M533 Sacrococcygeal disorders, not elsewhere classified: Secondary | ICD-10-CM | POA: Diagnosis not present

## 2022-01-04 DIAGNOSIS — M545 Low back pain, unspecified: Secondary | ICD-10-CM | POA: Diagnosis not present

## 2022-01-04 DIAGNOSIS — M25551 Pain in right hip: Secondary | ICD-10-CM | POA: Diagnosis not present

## 2022-01-08 ENCOUNTER — Other Ambulatory Visit: Payer: Self-pay | Admitting: Cardiology

## 2022-01-08 DIAGNOSIS — R058 Other specified cough: Secondary | ICD-10-CM | POA: Diagnosis not present

## 2022-01-08 DIAGNOSIS — U071 COVID-19: Secondary | ICD-10-CM | POA: Diagnosis not present

## 2022-01-08 DIAGNOSIS — J069 Acute upper respiratory infection, unspecified: Secondary | ICD-10-CM | POA: Diagnosis not present

## 2022-01-08 NOTE — Telephone Encounter (Signed)
Prescription refill request for Xarelto received.  Indication:Afib Last office visit:6/23 Weight:71.8 kg Age:81 Scr:0.9 CrCl:66.48 ml/min  Prescription refilled

## 2022-02-13 ENCOUNTER — Other Ambulatory Visit: Payer: Self-pay | Admitting: Cardiology

## 2022-02-17 DIAGNOSIS — Z85828 Personal history of other malignant neoplasm of skin: Secondary | ICD-10-CM | POA: Diagnosis not present

## 2022-02-17 DIAGNOSIS — C4441 Basal cell carcinoma of skin of scalp and neck: Secondary | ICD-10-CM | POA: Diagnosis not present

## 2022-02-17 DIAGNOSIS — L821 Other seborrheic keratosis: Secondary | ICD-10-CM | POA: Diagnosis not present

## 2022-02-17 DIAGNOSIS — L814 Other melanin hyperpigmentation: Secondary | ICD-10-CM | POA: Diagnosis not present

## 2022-02-17 DIAGNOSIS — Z08 Encounter for follow-up examination after completed treatment for malignant neoplasm: Secondary | ICD-10-CM | POA: Diagnosis not present

## 2022-02-17 DIAGNOSIS — D1801 Hemangioma of skin and subcutaneous tissue: Secondary | ICD-10-CM | POA: Diagnosis not present

## 2022-02-17 DIAGNOSIS — D485 Neoplasm of uncertain behavior of skin: Secondary | ICD-10-CM | POA: Diagnosis not present

## 2022-02-17 DIAGNOSIS — L570 Actinic keratosis: Secondary | ICD-10-CM | POA: Diagnosis not present

## 2022-03-23 DIAGNOSIS — J302 Other seasonal allergic rhinitis: Secondary | ICD-10-CM | POA: Diagnosis not present

## 2022-03-23 DIAGNOSIS — I2510 Atherosclerotic heart disease of native coronary artery without angina pectoris: Secondary | ICD-10-CM | POA: Diagnosis not present

## 2022-03-23 DIAGNOSIS — Z23 Encounter for immunization: Secondary | ICD-10-CM | POA: Diagnosis not present

## 2022-03-23 DIAGNOSIS — N401 Enlarged prostate with lower urinary tract symptoms: Secondary | ICD-10-CM | POA: Diagnosis not present

## 2022-03-23 DIAGNOSIS — I951 Orthostatic hypotension: Secondary | ICD-10-CM | POA: Diagnosis not present

## 2022-03-23 DIAGNOSIS — Z0000 Encounter for general adult medical examination without abnormal findings: Secondary | ICD-10-CM | POA: Diagnosis not present

## 2022-03-23 DIAGNOSIS — I700 Atherosclerosis of aorta: Secondary | ICD-10-CM | POA: Diagnosis not present

## 2022-03-23 DIAGNOSIS — Z1331 Encounter for screening for depression: Secondary | ICD-10-CM | POA: Diagnosis not present

## 2022-03-23 DIAGNOSIS — E7800 Pure hypercholesterolemia, unspecified: Secondary | ICD-10-CM | POA: Diagnosis not present

## 2022-03-23 DIAGNOSIS — Z8616 Personal history of COVID-19: Secondary | ICD-10-CM | POA: Diagnosis not present

## 2022-03-23 DIAGNOSIS — I480 Paroxysmal atrial fibrillation: Secondary | ICD-10-CM | POA: Diagnosis not present

## 2022-03-23 DIAGNOSIS — D6869 Other thrombophilia: Secondary | ICD-10-CM | POA: Diagnosis not present

## 2022-04-07 DIAGNOSIS — D485 Neoplasm of uncertain behavior of skin: Secondary | ICD-10-CM | POA: Diagnosis not present

## 2022-04-07 DIAGNOSIS — C4441 Basal cell carcinoma of skin of scalp and neck: Secondary | ICD-10-CM | POA: Diagnosis not present

## 2022-04-19 ENCOUNTER — Ambulatory Visit: Payer: Medicare Other | Attending: Internal Medicine | Admitting: Internal Medicine

## 2022-04-19 ENCOUNTER — Encounter: Payer: Self-pay | Admitting: Internal Medicine

## 2022-04-19 VITALS — BP 106/64 | HR 79 | Ht 72.0 in | Wt 162.4 lb

## 2022-04-19 DIAGNOSIS — I951 Orthostatic hypotension: Secondary | ICD-10-CM | POA: Diagnosis not present

## 2022-04-19 DIAGNOSIS — I4819 Other persistent atrial fibrillation: Secondary | ICD-10-CM | POA: Diagnosis not present

## 2022-04-19 DIAGNOSIS — I2510 Atherosclerotic heart disease of native coronary artery without angina pectoris: Secondary | ICD-10-CM | POA: Diagnosis not present

## 2022-04-19 NOTE — Patient Instructions (Addendum)
Medication Instructions:  Your physician recommends that you continue on your current medications as directed. Please refer to the Current Medication list given to you today.  *If you need a refill on your cardiac medications before your next appointment, please call your pharmacy*   Lab Work: None ordered.  If you have labs (blood work) drawn today and your tests are completely normal, you will receive your results only by: MyChart Message (if you have MyChart) OR A paper copy in the mail If you have any lab test that is abnormal or we need to change your treatment, we will call you to review the results.   Testing/Procedures: None ordered.    Follow-Up: At [facility], you and your health needs are our priority.  As part of our continuing mission to provide you with exceptional heart care, we have created designated Provider Care Teams.  These Care Teams include your primary Cardiologist (physician) and Advanced Practice Providers (APPs -  Physician Assistants and Nurse Practitioners) who all work together to provide you with the care you need, when you need it.  We recommend signing up for the patient portal called "MyChart".  Sign up information is provided on this After Visit Summary.  MyChart is used to connect with patients for Virtual Visits (Telemedicine).  Patients are able to view lab/test results, encounter notes, upcoming appointments, etc.  Non-urgent messages can be sent to your provider as well.   To learn more about what you can do with MyChart, go to [email or web address].    Your next appointment:   12 months with Dr Klein  Important Information About Sugar       

## 2022-04-19 NOTE — Progress Notes (Signed)
Patient Care Team: Antony Contras, MD as PCP - General (Family Medicine) Martinique, Peter M, MD as PCP - Cardiology (Cardiology) Deboraha Sprang, MD as PCP - Electrophysiology (Cardiology) Lavonna Monarch, MD (Inactive) as Consulting Physician (Dermatology)   HPI  Jerry Barr is a 80 y.o. male seen in follow-up for atrial fibrillation and orthostatic hypotension with little heart rate response concerning for more systemic autonomic insufficiency.  Improved with discontinuation of amiodarone, but more atrial fibrillation   Underwent AFib ablation 5/22 (JA)  no interval atrial fib of which he is aware;   continues on dofetilide and rivaroxaban History of remote coronary artery disease with stenting LAD 2011 circumflex 2020     The patient denies chest pain, shortness of breath, nocturnal dyspnea, orthopnea or peripheral edema.  There have been no palpitations or syncope.   Had a fall while at [facility].  Head CT no bleeding  Interim orthostasis symptomatic    DATE TEST EF    10/17 Echo   50-55% %    12/18 Echo  45-50%    12/20 LHC   LAD Stents patent  Cx-stent patent; RCA patent    Date Cr K Mg Hgb  5/21 1.26 4.6   13.9   4/22  0.96 4.9  2.5  14.5  6/23 0.98 4.7 2.3 62.9    Thromboembolic risk factors ( age -75, HTN-1, Vasc disease -1) for a CHADSVASc Score of >=4     Records and Results Reviewed   Past Medical History:  Diagnosis Date   Allergy    Aortic atherosclerosis (HCC)    Atrial fibrillation (HCC)    BPH (benign prostatic hyperplasia)    CAD (coronary artery disease)    Cancer (HCC)    basal cell ca - face, nose and right leg   Clotting disorder (HCC)    a fib hx - Xarelto   Coronary artery disease    Promus drug-eluting stent to a 99% mid LAD, 40-50% narrowing in the distal LAD with mild to moderate disease in the circ and RCA b. 5/6 PCI/DESx1 to mLcx   Dyslipidemia    ED (erectile dysfunction)    Hearing loss    Hyperplastic colon polyp     Hypoglycemia    Orthostatic hypotension    REM sleep behavior disorder    not tested    Past Surgical History:  Procedure Laterality Date   ATRIAL FIBRILLATION ABLATION N/A 11/18/2020   Procedure: ATRIAL FIBRILLATION ABLATION;  Surgeon: Thompson Grayer, MD;  Location: [place] [facility];  Service: Cardiovascular;  Laterality: N/A;   BUBBLE STUDY  08/27/2020   Procedure: BUBBLE STUDY;  Surgeon: Freada Bergeron, MD;  Location: [place];  Service: Cardiovascular;;   CARDIOVERSION N/A 04/23/2016   Procedure: CARDIOVERSION;  Surgeon: [name], MD;  Location: [place];  Service: Cardiovascular;  Laterality: N/A;   CARDIOVERSION N/A 08/21/2020   Procedure: CARDIOVERSION;  Surgeon: Dorothy Spark, MD;  Location: [facility];  Service: Cardiovascular;  Laterality: N/A;   CARDIOVERSION N/A 08/29/2020   Procedure: CARDIOVERSION;  Surgeon: Sanda Kingsley Herandez, MD;  Location: [place] [facility];  Service: Cardiovascular;  Laterality: N/A;   CARDIOVERSION N/A 09/24/2020   Procedure: CARDIOVERSION;  Surgeon: Freada Bergeron, MD;  Location: [place];  Service: Cardiovascular;  Laterality: N/A;   COLONOSCOPY     CORONARY ANGIOPLASTY WITH STENT PLACEMENT     CORONARY STENT INTERVENTION N/A 11/15/2018   Procedure: CORONARY STENT INTERVENTION;  Surgeon: Burnell Blanks, MD;  Location: [place] [facility];  Service: Cardiovascular;  Laterality: N/A;   LEFT HEART CATH AND CORONARY ANGIOGRAPHY N/A 11/15/2018   Procedure: LEFT HEART CATH AND CORONARY ANGIOGRAPHY;  Surgeon: Burnell Blanks, MD;  Location: [place] [facility];  Service: Cardiovascular;  Laterality: N/A;   LEFT HEART CATH AND CORONARY ANGIOGRAPHY N/A 06/25/2019   Procedure: LEFT HEART CATH AND CORONARY ANGIOGRAPHY;  Surgeon: Martinique, Peter M, MD;  Location: [place] [facility];  Service: Cardiovascular;  Laterality: N/A;   POLYPECTOMY     TEE WITHOUT CARDIOVERSION N/A 04/23/2016   Procedure: TRANSESOPHAGEAL ECHOCARDIOGRAM  (TEE);  Surgeon: [name], MD;  Location: [place];  Service: Cardiovascular;  Laterality: N/A;   TEE WITHOUT CARDIOVERSION N/A 08/27/2020   Procedure: TRANSESOPHAGEAL ECHOCARDIOGRAM (TEE);  Surgeon: Freada Bergeron, MD;  Location: [facility] ENDOSCOPY;  Service: Cardiovascular;  Laterality: N/A;   UPPER GASTROINTESTINAL ENDOSCOPY      Current Meds  Medication Sig   acetaminophen (TYLENOL) 500 MG tablet Take 500 mg by mouth every 6 (six) hours as needed for mild pain (or headaches).   atorvastatin (LIPITOR) 20 MG tablet TAKE 1 TABLET BY MOUTH EVERY DAY   Cholecalciferol (VITAMIN D-3) 1000 UNITS CAPS Take 1,000 Units by mouth daily.   dofetilide (TIKOSYN) 500 MCG capsule TAKE ONE CAPSULE BY MOUTH TWICE A DAY   famotidine (PEPCID) 20 MG tablet Take 20 mg by mouth as needed for heartburn or indigestion.   fluticasone (FLONASE) 50 MCG/ACT nasal spray Place 1 spray into both nostrils daily as needed for allergies (seasonal allergies).   loratadine (CLARITIN) 10 MG tablet Take 10 mg by mouth daily.   Melatonin 3 MG TABS Take 6 mg by mouth at bedtime.   midodrine (PROAMATINE) 10 MG tablet Take 1 tablet (10 mg total) by mouth 2 (two) times daily. [name]   Naphazoline-Pheniramine (ALLERGY EYE OP) Place 1 drop into both eyes daily as needed (allergies).   polyethylene glycol (MIRALAX / GLYCOLAX) 17 g packet Take 17 g by mouth daily as needed for mild constipation (MIX INTO 8 OUNCES OF WATER AND DRINK).   Wheat Dextrin (BENEFIBER PO) Take 10 BAU/100 mL by mouth daily. powder into 6 ounces of water and drink once a day as needed for mild constipation or to add fiber to the diet   XARELTO 20 MG TABS tablet TAKE 1 TABLET BY MOUTH EVERY DAY WITH BREAKFAST    Allergies  Allergen Reactions   Amiodarone Other (See Comments)   Metoprolol     Other reaction(s): fatigue   Penicillins Rash    Did it involve swelling of the  face/tongue/throat, SOB, or low BP? No Did it involve sudden or severe rash/hives, skin peeling, or any reaction on the inside of your mouth or nose? No Did you need to seek medical attention at a hospital or doctor's office? No When did it last happen?      50+ years  If all above answers are "NO", may proceed with cephalosporin use.       Review of Systems negative except from HPI and PMH  Physical Exam BP 106/64 (BP Location: Right Arm, Patient Position: Standing)   Pulse 79   Ht 6' (1.829 m)   Wt 162 lb 6.4 oz (73.7 kg)   SpO2 97%   BMI 22.03 kg/m  Well developed and well nourished in no acute distress HENT normal Neck supple with JVP-flat Clear Regular rate  and rhythm, no murmur Abd-soft with active BS No Clubbing cyanosis  edema Skin-warm and dry A & Oriented  Grossly normal sensory and motor function  ECG sinus at 78 22/08/42  Assessment and  Plan Orthostatic intolerance with modest change in heart rate with following blood pressure  Coronary artery disease with prior stenting   Atrial fibrillation  High Risk Medication Surveillance dofetilide   Palpitations  No interval atrial fibrillation of which she is aware, continue dofetilide and rivaroxaban.  Review of orthostatic hypotension, the physiology and his actions.  Heart rate response modestly suggestive of some intact autonomic response but sometimes does not, question systemic dysautonomia  Without symptoms of ischemia.  Continue 20 atorvastatin statin,  and rivaroxaban   Current medicines are reviewed at length with the patient today .  The patient does not  have concerns regarding medicines.

## 2022-04-29 NOTE — Progress Notes (Signed)
Date:  05/04/2022   ID:  Jerry Barr, DOB [birth year], MRN [id number]   PCP:  Antony Contras, MD  Cardiologist:  Hayzel Ruberg Martinique, MD  Electrophysiologist:  Virl Axe, MD   Evaluation Performed:  Follow-Up Visit  Chief Complaint: shoulder pain.   History of Present Illness:    Jerry Barr is a 81 y.o. male seen for follow up post cardiac cath. He has a  PMH of HLD, chronic hypotension, PAF on Xarelto, CAD s/p DES to LAD 2011 in [place] and BPH. He was diagnosed with new afib in 04/2016. Echo in 04/2016 showed EF 50-55%, no RWMA, mild MR. He had successful TEE DCCV 04/23/2016. He was placed on midodrine for orthostatic hypotension. He is not on any rate control therapy for the same reason. Myoview  In 06/2017 showed EF 55%, no significant ischemia or scar. Echo in 06/2017 EF 45-50%, mild diffuse hypokinesis, PA peak pressure 30mHg.  Patient was seen in the  ED in April  with recurrent afib and fatigue. Given his history of orthostatic hypotension, it was elected to proceed with cardioversion in the ED. This was performed by Dr. BHaroldine Laws Post cardioversion, his amiodarone was increased to '200mg'$  BID. After discharge, he was seen in the afib clinic with plans to consider decreasing amiodarone back to '200mg'$  daily after 1 month if no recurrent afib.   He was seen on 10/30/2018 for post hospital follow-up, at which time he noted  shoulder blade pain that occur with  walking. He had a  cardiac catheterization on 11/15/2018 which revealed 20% proximal RCA, 90% mid left circumflex lesion treated with a 2.25 x 16 mm Synergy DES, EF 55 to 65%.   He has been diagnosed with an REM sleep disorder managed by Dr OMaxwell Caul He was seen in the Afib clinic on 05/16/19 and was maintaining NSR on 200 mg of amiodarone. More recently  he reported recurrent exertional neck and pain in his shoulder blade that resolves with rest. This may occur in the am with initial activity and also with his daily walk. Sometimes  if he continues to walk it goes away after 15 minutes. He also notes increased heartburn in the past 2 weeks. These symptoms are similar to what he had in May and in 2011 with his initial stent. He underwent repeat Cardiac cath on 06/25/19 and this showed only nonobstructive disease with patent stents.   He has since been seen by Dr KCaryl Comesfor evaluation of his orthostatic hypotension- felt to be related to autonomic failure. Echo was OK. Dr KCaryl Comesfelt that amiodarone was making symptoms worse and this was discontinued. Event monitor showed AFib burden of 0.6% so AAD therapy was not needed. He was tried on low dose metoprolol but did not tolerate this due to fatigue and indigestion.   He was seen in February 2022 with recurrent and persistent AFib. Had DCCV but early return. He was admitted and started on Tikosyn. Had repeat DCCV on 08/29/20. Had recurrent Afib in March with DCCV. Subsequently had Afib ablation in May. Follow up with EP demonstrated no recurrence. Also seen by Dr KCaryl Comesfor orthostatic intolerance and midodrine dose increased.   On follow up today he is doing well. His orthostasis is doing well. Has learned to manage it with staying upright, wearing compression hose, and using salt. Denies any recurrent Afib since ablation. No chest pain or indigestion. He is walking regularly. [place]  had a mechanical fall while visiting [place]. Had head CT which was negative. Took a while to recover from coccyx injury.   Past Medical History:  Diagnosis Date   Allergy    Aortic atherosclerosis (HCC)    Atrial fibrillation (HCC)    BPH (benign prostatic hyperplasia)    CAD (coronary artery disease)    Cancer (HCC)    basal cell ca - face, nose and right leg   Clotting disorder (HCC)    a fib hx - Xarelto   Coronary artery disease    Promus drug-eluting stent to a 99% mid LAD, 40-50% narrowing in the distal LAD with  mild to moderate disease in the circ and RCA b. 5/6 PCI/DESx1 to mLcx   Dyslipidemia    ED (erectile dysfunction)    Hearing loss    Hyperplastic colon polyp    Hypoglycemia    Orthostatic hypotension    REM sleep behavior disorder    not tested   Past Surgical History:  Procedure Laterality Date   ATRIAL FIBRILLATION ABLATION N/A 11/18/2020   Procedure: ATRIAL FIBRILLATION ABLATION;  Surgeon: Thompson Grayer, MD;  Location: [facility];  Service: Cardiovascular;  Laterality: N/A;   BUBBLE STUDY  08/27/2020   Procedure: BUBBLE STUDY;  Surgeon: Freada Bergeron, MD;  Location: [facility];  Service: Cardiovascular;;   CARDIOVERSION N/A 04/23/2016   Procedure: CARDIOVERSION;  Surgeon: [name], MD;  Location: [facility];  Service: Cardiovascular;  Laterality: N/A;   CARDIOVERSION N/A 08/21/2020   Procedure: CARDIOVERSION;  Surgeon: Dorothy Spark, MD;  Location: [facility];  Service: Cardiovascular;  Laterality: N/A;   CARDIOVERSION N/A 08/29/2020   Procedure: CARDIOVERSION;  Surgeon: [name], MD;  Location: [place] [facility];  Service: Cardiovascular;  Laterality: N/A;   CARDIOVERSION N/A 09/24/2020   Procedure: CARDIOVERSION;  Surgeon: Freada Bergeron, MD;  Location: [facility];  Service: Cardiovascular;  Laterality: N/A;   COLONOSCOPY     CORONARY ANGIOPLASTY WITH STENT PLACEMENT     CORONARY STENT INTERVENTION N/A 11/15/2018   Procedure: CORONARY STENT INTERVENTION;  Surgeon: Burnell Blanks, MD;  Location: [facility];  Service: Cardiovascular;  Laterality: N/A;   LEFT HEART CATH AND CORONARY ANGIOGRAPHY N/A 11/15/2018   Procedure: LEFT HEART CATH AND CORONARY ANGIOGRAPHY;  Surgeon: Burnell Blanks, MD;  Location: [facility];  Service: Cardiovascular;  Laterality: N/A;   LEFT HEART CATH AND CORONARY ANGIOGRAPHY N/A 06/25/2019   Procedure: LEFT HEART CATH AND CORONARY ANGIOGRAPHY;  Surgeon: Martinique, Karalyn Kadel M, MD;  Location: [facility];  Service: Cardiovascular;  Laterality: N/A;   POLYPECTOMY     TEE WITHOUT CARDIOVERSION N/A 04/23/2016   Procedure: TRANSESOPHAGEAL ECHOCARDIOGRAM (TEE);  Surgeon: [name], MD;  Location: [place];  Service: Cardiovascular;  Laterality: N/A;   TEE WITHOUT CARDIOVERSION N/A 08/27/2020   Procedure: TRANSESOPHAGEAL ECHOCARDIOGRAM (TEE);  Surgeon: Freada Bergeron, MD;  Location: [facility];  Service: Cardiovascular;  Laterality: N/A;   UPPER GASTROINTESTINAL ENDOSCOPY       Current Meds  Medication Sig   acetaminophen (TYLENOL) 500 MG tablet Take 500 mg by mouth every 6 (six) hours as needed for mild pain (or headaches).   atorvastatin (LIPITOR) 20 MG tablet TAKE 1 TABLET BY MOUTH EVERY DAY   Cholecalciferol (VITAMIN D-3) 1000 UNITS CAPS Take 1,000 Units by mouth daily.   dofetilide (TIKOSYN) 500 MCG capsule TAKE ONE CAPSULE BY MOUTH TWICE A DAY   famotidine (PEPCID) 20  MG tablet Take 20 mg by mouth as needed for heartburn or indigestion.   fluticasone (FLONASE) 50 MCG/ACT nasal spray Place 1 spray into both nostrils daily as needed for allergies (seasonal allergies).   loratadine (CLARITIN) 10 MG tablet Take 10 mg by mouth daily.   Melatonin 3 MG TABS Take 6 mg by mouth at bedtime.   midodrine (PROAMATINE) 10 MG tablet Take 1 tablet (10 mg total) by mouth 2 (two) times daily. [name]   Naphazoline-Pheniramine (ALLERGY EYE OP) Place 1 drop into both eyes daily as needed (allergies).   polyethylene glycol (MIRALAX / GLYCOLAX) 17 g packet Take 17 g by mouth daily as needed for mild constipation (MIX INTO 8 OUNCES OF WATER AND DRINK).   Wheat Dextrin (BENEFIBER PO) Take 10 BAU/100 mL by mouth daily. powder into 6 ounces of water and drink once a day as needed for mild constipation or to add fiber to the diet   XARELTO 20 MG TABS tablet TAKE 1 TABLET BY MOUTH EVERY DAY WITH BREAKFAST      Allergies:   Amiodarone, Metoprolol, and Penicillins   Social History   Tobacco Use   Smoking status: Never   Smokeless tobacco: Never  Vaping Use   Vaping Use: Never used  Substance Use Topics   Alcohol use: Not Currently    Alcohol/week: 4.0 standard drinks of alcohol    Types: 4 Glasses of wine per week    Comment: 3oz of red wine   Drug use: No     Family Hx: The patient's family history includes Bone cancer in his paternal grandfather; Breast cancer in his mother; CAD in his father; Cancer in his brother; Colon cancer in his brother and mother; Esophageal cancer in his brother; Heart attack in his paternal grandmother; Leukemia in his paternal uncle; Liver cancer in his brother. There is no history of Pancreatic cancer, Prostate cancer, Rectal cancer, or Stomach cancer.  ROS:   Please see the history of present illness.     All other systems reviewed and are negative.   Prior CV studies:   The following studies were reviewed today:  Echo 06/13/17: Study Conclusions   - Left ventricle: The cavity size was normal. Systolic function was   mildly reduced. The estimated ejection fraction was in the range   of 45% to 50%. Mild diffuse hypokinesis. Left ventricular   diastolic function parameters were normal. - Aortic valve: Transvalvular velocity was within the normal range.   There was no stenosis. There was trivial regurgitation. - Mitral valve: There was trivial regurgitation. - Left atrium: The atrium was moderately dilated. - Right ventricle: The cavity size was normal. Wall thickness was   normal. Systolic function was normal. - Right atrium: The atrium was severely dilated. - Atrial septum: No defect or patent foramen ovale was identified   by color flow Doppler. - Tricuspid valve: There was trivial regurgitation. - Pulmonary arteries: Systolic pressure was within the normal   range. PA peak pressure: 24 mm Hg (S).  Cath 11/15/2018 Prox RCA lesion is 20%  stenosed. Mid Cx lesion is 90% stenosed. Previously placed Prox LAD to Mid LAD stent (unknown type) is widely patent. A drug-eluting stent was successfully placed using a STENT SYNERGY DES 2.25X16. Post intervention, there is a 0% residual stenosis. The left ventricular systolic function is normal. LV end diastolic pressure is normal. The left ventricular ejection fraction is 55-65% by visual  estimate. There is no mitral valve regurgitation.   1. Patent mid LAD stent without restenosis 2. Severe stenosis mid Circumflex artery.  3. Successful PTCA/DES x 1 mid Circumflex 4. Mild non-obstructive disease in the RCA 5. Normal LV systolic function   Recommendations: ASA and Plavix for one month then stop ASA. Will restart Xarelto tomorrow. Would recommend at least six month of Plavix and Xarelto but could consider stopping Plavix at 6 months if necessary. Likely same day PCI discharge.    Cardiac cath 06/25/19: Procedures  LEFT HEART CATH AND CORONARY ANGIOGRAPHY  Conclusion    Non-stenotic Prox LAD to Mid LAD lesion was previously treated. Previously placed Mid Cx drug eluting stent is widely patent. Balloon angioplasty was performed. Prox RCA lesion is 20% stenosed. The left ventricular systolic function is normal. LV end diastolic pressure is normal. The left ventricular ejection fraction is 55-65% by visual estimate.   1. Nonobstructive CAD- patent stents in LAD and LCx. 2. Normal LV function 3. Normal LVEDP   Plan: continue medical therapy.      Echo 12/14/19: IMPRESSIONS     1. Left ventricular ejection fraction, by estimation, is 55 to 60%. The  left ventricle has normal function. The left ventricle has no regional  wall motion abnormalities. Left ventricular diastolic parameters were  normal. The average left ventricular  global longitudinal strain is -20.5 %. The global longitudinal strain is  normal.   2. Right ventricular systolic function is normal. The right  ventricular  size is normal. There is normal pulmonary artery systolic pressure. The  estimated right ventricular systolic pressure is 15.4 mmHg.   3. The mitral valve is normal in structure. Mild mitral valve  regurgitation. No evidence of mitral stenosis.   4. The aortic valve is normal in structure. Aortic valve regurgitation is  not visualized. No aortic stenosis is present.   5. The inferior vena cava is normal in size with greater than 50%  respiratory variability, suggesting right atrial pressure of 3 mmHg.    Event monitor 01/01/20: Study Highlights  Indication: palpitations   Duration: 30d   Findings HR  avg 67  Min 51-Max 127  PVCs <1% PACs about 1 % AFib noted < 1% Recommendations Will try low dose BB See note  TEE 08/27/20: IMPRESSIONS     1. Left ventricular ejection fraction, by estimation, is 55 to 60%. The  left ventricle has normal function.   2. Right ventricular systolic function is normal. The right ventricular  size is normal. There is normal pulmonary artery systolic pressure.   3. Left atrial size was moderately dilated. No left atrial/left atrial  appendage thrombus was detected.   4. Right atrial size was mildly dilated.   5. The mitral valve is grossly normal. Mild mitral valve regurgitation.   6. The aortic valve is tricuspid. There is mild thickening of the aortic  valve. Aortic valve regurgitation is not visualized. Mild aortic valve  sclerosis is present, with no evidence of aortic valve stenosis.   7. Agitated saline contrast bubble study was negative, with no evidence  of any interatrial shunt.   Labs/Other Tests and Data Reviewed:     Recent Labs: 12/15/2021: BUN 27; Creatinine, Ser 0.98; Hemoglobin 14.5; Magnesium 2.3; Platelets 228; Potassium 4.7; Sodium 142   Recent Lipid Panel Lab Results  Component Value Date/Time   CHOL 120 03/23/2019 08:38 AM   TRIG 42 03/23/2019 08:38 AM   HDL 50 03/23/2019 08:38 AM   CHOLHDL 2.4 03/23/2019  08:38 AM   CHOLHDL 3.0 06/29/2016 10:11 AM   LDLCALC 59 03/23/2019 08:38 AM   Dated 02/20/20: choleseterol 128, triglycerides 38, HDL 54, LDL 64. Dated 03/06/21: cholesterol 126, triglycerides 37, HDL 50, LDL 66. CMET, TSH, CBC normal.  Dated 03/23/22: cholesterol 118, triglycerides 42, HDL 43, LDL 64. CMET, TSH, CBC normal.    Wt Readings from Last 3 Encounters:  05/04/22 161 lb 9.6 oz (73.3 kg)  04/19/22 162 lb 6.4 oz (73.7 kg)  12/15/21 158 lb 6.4 oz (71.8 kg)     Objective:    Vital Signs:  BP 122/68   Pulse 74   Ht 6' (1.829 m)   Wt 161 lb 9.6 oz (73.3 kg)   SpO2 99%   BMI 21.92 kg/m    GENERAL:  Well appearing WM in NAD HEENT:  PERRL, EOMI, sclera are clear. Oropharynx is clear. NECK:  No jugular venous distention, carotid upstroke brisk and symmetric, no bruits, no thyromegaly or adenopathy LUNGS:  Clear to auscultation bilaterally CHEST:  Unremarkable HEART:  RRR,  PMI not displaced or sustained,S1 and S2 within normal limits, no S3, no S4: no clicks, no rubs, no murmurs ABD:  Soft, nontender. BS +, no masses or bruits. No hepatomegaly, no splenomegaly EXT:  2 + pulses throughout, no edema, no cyanosis no clubbing. No radial site hematoma SKIN:  Warm and dry.  No rashes NEURO:  Alert and oriented x 3. Cranial nerves II through XII intact. PSYCH:  Cognitively intact    ASSESSMENT & PLAN:    1.   CAD: s/p PCI of the LCx with DES in May 2020.  Prior stent of the mid LAD in 2011. Repeat cardiac cath in Dec 2020 showed nonobstructive disease.curreenlty angina free.  Antianginal therapy limited by orthostatic hypotension. He is asymptomatic now.  2.   PAF: s/p DCCV on October 15, 2019. Recurrent in Feb 2022 and March 2022. Now s/p Afib ablation in May 2022.  Continue Tikosyn.  Continue Xarelto therapy.   Unable to tolerate  rate control therapy with metoprolol  or amiodarone due to side effects. Maintaining NSR so far. Followed by Dr Caryl Comes  3.   Chronic orthostatic  hypotension/ autonomic failure: intolerant of  higher dose of midodrine. Increased salt water intake. His blood pressure is stable. He reports some this is doing much better and he has learned to manage it.     4.   Hyperlipidemia: Continue current statin. Excellent control.     Disposition:  6 month   Signed, Delainey Winstanley Martinique, MD  05/04/2022 8:52 AM    [facility]

## 2022-05-04 ENCOUNTER — Ambulatory Visit: Payer: Medicare Other | Attending: Cardiology | Admitting: Cardiology

## 2022-05-04 ENCOUNTER — Encounter: Payer: Self-pay | Admitting: Cardiology

## 2022-05-04 VITALS — BP 122/68 | HR 74 | Ht 72.0 in | Wt 161.6 lb

## 2022-05-04 DIAGNOSIS — I2510 Atherosclerotic heart disease of native coronary artery without angina pectoris: Secondary | ICD-10-CM | POA: Diagnosis not present

## 2022-05-04 DIAGNOSIS — I951 Orthostatic hypotension: Secondary | ICD-10-CM | POA: Diagnosis not present

## 2022-05-04 DIAGNOSIS — I480 Paroxysmal atrial fibrillation: Secondary | ICD-10-CM | POA: Diagnosis not present

## 2022-05-21 ENCOUNTER — Telehealth: Payer: Self-pay | Admitting: Internal Medicine

## 2022-05-21 NOTE — Telephone Encounter (Signed)
Spoke with pt, he took his regular dose at 8 am and then took another dose at 10 am. He reports he feels fine currently. He wants to know what to do about his evening dose tonight and what to look for today. Aware will forward the information to the pharm d to advise.

## 2022-05-21 NOTE — Telephone Encounter (Signed)
Patient should be ok. Recommend he continue normal dosing schedule tonight.  Should watch for headaches, chest pain, dizziness, increased hunger/thirst.

## 2022-05-21 NOTE — Telephone Encounter (Signed)
Pt c/o medication issue:  1. Name of Medication:   dofetilide (TIKOSYN) 500 MCG capsule   2. How are you currently taking this medication (dosage and times per day)?  As prescribed   3. Are you having a reaction (difficulty breathing--STAT)?   4. What is your medication issue?   Patient stated he took his first dose of this medication today and his usual time and mistakenly toke another dose 2 hours later.  Patient would like a call back to discuss next steps.

## 2022-05-21 NOTE — Telephone Encounter (Signed)
Spoke with pt, aware of response.

## 2022-06-21 DIAGNOSIS — G4752 REM sleep behavior disorder: Secondary | ICD-10-CM | POA: Diagnosis not present

## 2022-07-24 ENCOUNTER — Other Ambulatory Visit: Payer: Self-pay | Admitting: Cardiology

## 2022-08-13 DIAGNOSIS — H2513 Age-related nuclear cataract, bilateral: Secondary | ICD-10-CM | POA: Diagnosis not present

## 2022-08-19 ENCOUNTER — Encounter (HOSPITAL_COMMUNITY): Payer: Self-pay | Admitting: *Deleted

## 2022-08-21 ENCOUNTER — Other Ambulatory Visit: Payer: Self-pay | Admitting: Cardiology

## 2022-08-23 NOTE — Telephone Encounter (Signed)
Pt last saw Dr Martinique 05/04/22, last labs 12/15/21 Creat 0.98, age 82, weight 73.3kg, CrCl 61.29, based on CrCl pt is on appropriate dosage of Xarelto 35m QD for afib.  Will refill rx.

## 2022-09-07 DIAGNOSIS — H52221 Regular astigmatism, right eye: Secondary | ICD-10-CM | POA: Diagnosis not present

## 2022-09-07 DIAGNOSIS — H2511 Age-related nuclear cataract, right eye: Secondary | ICD-10-CM | POA: Diagnosis not present

## 2022-09-07 DIAGNOSIS — Z961 Presence of intraocular lens: Secondary | ICD-10-CM | POA: Diagnosis not present

## 2022-09-07 DIAGNOSIS — H269 Unspecified cataract: Secondary | ICD-10-CM | POA: Diagnosis not present

## 2022-10-19 DIAGNOSIS — Z961 Presence of intraocular lens: Secondary | ICD-10-CM | POA: Diagnosis not present

## 2022-10-19 DIAGNOSIS — H2512 Age-related nuclear cataract, left eye: Secondary | ICD-10-CM | POA: Diagnosis not present

## 2022-10-19 DIAGNOSIS — H269 Unspecified cataract: Secondary | ICD-10-CM | POA: Diagnosis not present

## 2022-10-25 DIAGNOSIS — L570 Actinic keratosis: Secondary | ICD-10-CM | POA: Diagnosis not present

## 2022-11-04 DIAGNOSIS — N390 Urinary tract infection, site not specified: Secondary | ICD-10-CM | POA: Diagnosis not present

## 2022-11-24 NOTE — Progress Notes (Signed)
Date:  11/30/2022   ID:  Jerry Barr, DOB 02-Oct-1940, MRN [id number]   PCP:  Tally Joe, MD  Cardiologist:  Lendell Gallick [place], MD  Electrophysiologist:  Sherryl Manges, MD   Evaluation Performed:  Follow-Up Visit  Chief Complaint: shoulder pain.   History of Present Illness:    Jerry Barr is a 82 y.o. male seen for follow up. He has a  PMH of HLD, chronic hypotension, PAF on Xarelto, CAD s/p DES to LAD 2011 in [place] and BPH. He was diagnosed with new afib in 04/2016. Echo in 04/2016 showed EF 50-55%, no RWMA, mild MR. He had successful TEE DCCV 04/23/2016. He was placed on midodrine for orthostatic hypotension. He is not on any rate control therapy for the same reason. Myoview  In 06/2017 showed EF 55%, no significant ischemia or scar. Echo in 06/2017 EF 45-50%, mild diffuse hypokinesis, PA peak pressure .  Patient was seen in the  ED in April  2020 with recurrent afib and fatigue. Given his history of orthostatic hypotension, it was elected to proceed with cardioversion in the ED. This was performed by Dr. Gala Romney. Post cardioversion, his amiodarone was increased to 200mg  BID. After discharge, he was seen in the afib clinic with plans to consider decreasing amiodarone back to 200mg  daily after 1 month if no recurrent afib.   He was seen on 10/30/2018 for post hospital follow-up, at which time he noted  shoulder blade pain that occur with  walking. He had a  cardiac catheterization on 11/15/2018 which revealed 20% proximal RCA, 90% mid left circumflex lesion treated with a 2.25 x 16 mm Synergy DES, EF 55 to 65%.   He has been diagnosed with an REM sleep disorder managed by Dr Earl Gala. He was seen in the Afib clinic on 05/16/19 and was maintaining NSR on 200 mg of amiodarone. More recently  he reported recurrent exertional neck and pain in his shoulder blade that resolves with rest. These symptoms are similar to what he had in May and in 2011 with his initial stent. He underwent  repeat Cardiac cath on 06/25/19 and this showed only nonobstructive disease with patent stents.   He has since been seen by Dr Graciela Husbands for evaluation of his orthostatic hypotension- felt to be related to autonomic dysfunction. Echo was OK. Dr Graciela Husbands felt that amiodarone was making symptoms worse and this was discontinued. Event monitor showed AFib burden of 0.6% so AAD therapy was not needed. He was tried on low dose metoprolol but did not tolerate this due to fatigue and indigestion.   He was seen in February 2022 with recurrent and persistent AFib. Had DCCV but early return. He was admitted and started on Tikosyn. Had repeat DCCV on 08/29/20. Had recurrent Afib in March with DCCV. Subsequently had Afib ablation in May. Follow up with EP demonstrated no recurrence. Also seen by Dr Graciela Husbands for orthostatic intolerance and midodrine dose increased.   On follow up today he is doing well. His orthostasis comes and goes. Had a lot more trouble with this at the end of Feb and into March. Has done better in April/May. One day in March he couldn't even get out of bed until 1 pm. Drinks extra salt water that seems to help. Also uses midodrine PRN. Denies any chest  pain or dyspnea. No Afib since ablation.  Past Medical History:  Diagnosis Date   Allergy    Aortic atherosclerosis (HCC)    Atrial fibrillation (HCC)  BPH (benign prostatic hyperplasia)    CAD (coronary artery disease)    Cancer (HCC)    basal cell ca - face, nose and right leg   Clotting disorder (HCC)    a fib hx - Xarelto   Coronary artery disease    Promus drug-eluting stent to a 99% mid LAD, 40-50% narrowing in the distal LAD with mild to moderate disease in the circ and RCA b. 5/6 PCI/DESx1 to mLcx   Dyslipidemia    ED (erectile dysfunction)    Hearing loss    Hyperplastic colon polyp    Hypoglycemia    Orthostatic hypotension    REM sleep behavior disorder    not tested   Past Surgical History:  Procedure Laterality Date    ATRIAL FIBRILLATION ABLATION N/A 11/18/2020   Procedure: ATRIAL FIBRILLATION ABLATION;  Surgeon: Hillis Range, MD;  Location: [facility] INVASIVE CV LAB;  Service: Cardiovascular;  Laterality: N/A;   BUBBLE STUDY  08/27/2020   Procedure: BUBBLE STUDY;  Surgeon: Meriam Sprague, MD;  Location: [facility] ENDOSCOPY;  Service: Cardiovascular;;   CARDIOVERSION N/A 04/23/2016   Procedure: CARDIOVERSION;  Surgeon: Jake Bathe, MD;  Location: [facility] ENDOSCOPY;  Service: Cardiovascular;  Laterality: N/A;   CARDIOVERSION N/A 08/21/2020   Procedure: CARDIOVERSION;  Surgeon: Lars Masson, MD;  Location: [facility] ENDOSCOPY;  Service: Cardiovascular;  Laterality: N/A;   CARDIOVERSION N/A 08/29/2020   Procedure: CARDIOVERSION;  Surgeon: Thurmon Fair, MD;  Location: [facility] ENDOSCOPY;  Service: Cardiovascular;  Laterality: N/A;   CARDIOVERSION N/A 09/24/2020   Procedure: CARDIOVERSION;  Surgeon: Meriam Sprague, MD;  Location: [facility] ENDOSCOPY;  Service: Cardiovascular;  Laterality: N/A;   COLONOSCOPY     CORONARY ANGIOPLASTY WITH STENT PLACEMENT     CORONARY STENT INTERVENTION N/A 11/15/2018   Procedure: CORONARY STENT INTERVENTION;  Surgeon: Kathleene Hazel, MD;  Location: [facility] INVASIVE CV LAB;  Service: Cardiovascular;  Laterality: N/A;   LEFT HEART CATH AND CORONARY ANGIOGRAPHY N/A 11/15/2018   Procedure: LEFT HEART CATH AND CORONARY ANGIOGRAPHY;  Surgeon: Kathleene Hazel, MD;  Location: [facility] INVASIVE CV LAB;  Service: Cardiovascular;  Laterality: N/A;   LEFT HEART CATH AND CORONARY ANGIOGRAPHY N/A 06/25/2019   Procedure: LEFT HEART CATH AND CORONARY ANGIOGRAPHY;  Surgeon: [place], Bianey Tesoro M, MD;  Location: [facility] INVASIVE CV LAB;  Service: Cardiovascular;  Laterality: N/A;   POLYPECTOMY     TEE WITHOUT CARDIOVERSION N/A 04/23/2016   Procedure: TRANSESOPHAGEAL ECHOCARDIOGRAM (TEE);  Surgeon: Jake Bathe, MD;  Location: [facility] ENDOSCOPY;  Service: Cardiovascular;  Laterality: N/A;   TEE WITHOUT CARDIOVERSION N/A 08/27/2020    Procedure: TRANSESOPHAGEAL ECHOCARDIOGRAM (TEE);  Surgeon: Meriam Sprague, MD;  Location: [facility] ENDOSCOPY;  Service: Cardiovascular;  Laterality: N/A;   UPPER GASTROINTESTINAL ENDOSCOPY       Current Meds  Medication Sig   acetaminophen (TYLENOL) 500 MG tablet Take 500 mg by mouth every 6 (six) hours as needed for mild pain (or headaches).   atorvastatin (LIPITOR) 20 MG tablet TAKE 1 TABLET BY MOUTH EVERY DAY   cefdinir (OMNICEF) 300 MG capsule Take 300 mg by mouth 2 (two) times daily.   Cholecalciferol (VITAMIN D-3) 1000 UNITS CAPS Take 1,000 Units by mouth daily.   dofetilide (TIKOSYN) 500 MCG capsule TAKE ONE CAPSULE BY MOUTH TWICE A DAY   loratadine (CLARITIN) 10 MG tablet Take 10 mg by mouth daily.   Melatonin 3 MG TABS Take 6 mg by mouth at bedtime.   midodrine (PROAMATINE) 5 MG tablet Take 5 mg by  mouth as needed.   Olopatadine HCl (PATADAY OP) Apply 1 drop to eye as needed.   polyethylene glycol (MIRALAX / GLYCOLAX) 17 g packet Take 17 g by mouth daily as needed for mild constipation (MIX INTO 8 OUNCES OF WATER AND DRINK).   XARELTO 20 MG TABS tablet TAKE 1 TABLET BY MOUTH EVERY DAY WITH BREAKFAST   [DISCONTINUED] midodrine (PROAMATINE) 10 MG tablet Take 1 tablet (10 mg total) by mouth 2 (two) times daily. [name]     Allergies:   Amiodarone, Metoprolol, and Penicillins   Social History   Tobacco Use   Smoking status: Never   Smokeless tobacco: Never  Vaping Use   Vaping Use: Never used  Substance Use Topics   Alcohol use: Not Currently    Alcohol/week: 4.0 standard drinks of alcohol    Types: 4 Glasses of wine per week    Comment: 3oz of red wine   Drug use: No     Family Hx: The patient's family history includes Bone cancer in his paternal grandfather; Breast cancer in his mother; CAD in his father; Cancer in his brother; Colon cancer in his brother and mother; Esophageal cancer in his  brother; Heart attack in his paternal grandmother; Leukemia in his paternal uncle; Liver cancer in his brother. There is no history of Pancreatic cancer, Prostate cancer, Rectal cancer, or Stomach cancer.  ROS:   Please see the history of present illness.     All other systems reviewed and are negative.   Prior CV studies:   The following studies were reviewed today:  Echo 06/13/17: Study Conclusions   - Left ventricle: The cavity size was normal. Systolic function was   mildly reduced. The estimated ejection fraction was in the range   of 45% to 50%. Mild diffuse hypokinesis. Left ventricular   diastolic function parameters were normal. - Aortic valve: Transvalvular velocity was within the normal range.   There was no stenosis. There was trivial regurgitation. - Mitral valve: There was trivial regurgitation. - Left atrium: The atrium was moderately dilated. - Right ventricle: The cavity size was normal. Wall thickness was   normal. Systolic function was normal. - Right atrium: The atrium was severely dilated. - Atrial septum: No defect or patent foramen ovale was identified   by color flow Doppler. - Tricuspid valve: There was trivial regurgitation. - Pulmonary arteries: Systolic pressure was within the normal   range. PA peak pressure: 24 mm Hg (S).  Cath 11/15/2018 Prox RCA lesion is 20% stenosed. Mid Cx lesion is 90% stenosed. Previously placed Prox LAD to Mid LAD stent (unknown type) is widely patent. A drug-eluting stent was successfully placed using a STENT SYNERGY DES 2.25X16. Post intervention, there is a 0% residual stenosis. The left ventricular systolic function is normal. LV end diastolic pressure is normal. The left ventricular ejection fraction is 55-65% by visual estimate. There is no mitral valve regurgitation.   1. Patent mid LAD stent without restenosis 2. Severe stenosis mid Circumflex artery.  3. Successful PTCA/DES x 1 mid Circumflex 4. Mild  non-obstructive disease in the RCA 5. Normal LV systolic function   Recommendations: ASA and Plavix for one month then stop ASA. Will restart Xarelto tomorrow. Would recommend at least six month of Plavix and Xarelto but could consider stopping Plavix at 6 months if necessary. Likely same day PCI discharge.    Cardiac cath 06/25/19: Procedures  LEFT HEART CATH AND  CORONARY ANGIOGRAPHY  Conclusion    Non-stenotic Prox LAD to Mid LAD lesion was previously treated. Previously placed Mid Cx drug eluting stent is widely patent. Balloon angioplasty was performed. Prox RCA lesion is 20% stenosed. The left ventricular systolic function is normal. LV end diastolic pressure is normal. The left ventricular ejection fraction is 55-65% by visual estimate.   1. Nonobstructive CAD- patent stents in LAD and LCx. 2. Normal LV function 3. Normal LVEDP   Plan: continue medical therapy.      Echo 12/14/19: IMPRESSIONS     1. Left ventricular ejection fraction, by estimation, is 55 to 60%. The  left ventricle has normal function. The left ventricle has no regional  wall motion abnormalities. Left ventricular diastolic parameters were  normal. The average left ventricular  global longitudinal strain is -20.5 %. The global longitudinal strain is  normal.   2. Right ventricular systolic function is normal. The right ventricular  size is normal. There is normal pulmonary artery systolic pressure. The  estimated right ventricular systolic pressure is 31.5 mmHg.   3. The mitral valve is normal in structure. Mild mitral valve  regurgitation. No evidence of mitral stenosis.   4. The aortic valve is normal in structure. Aortic valve regurgitation is  not visualized. No aortic stenosis is present.   5. The inferior vena cava is normal in size with greater than 50%  respiratory variability, suggesting right atrial pressure of 3 mmHg.    Event monitor 01/01/20: Study Highlights  Indication:  palpitations   Duration: 30d   Findings HR  avg 67  Min 51-Max 127  PVCs <1% PACs about 1 % AFib noted < 1% Recommendations Will try low dose BB See note  TEE 08/27/20: IMPRESSIONS     1. Left ventricular ejection fraction, by estimation, is 55 to 60%. The  left ventricle has normal function.   2. Right ventricular systolic function is normal. The right ventricular  size is normal. There is normal pulmonary artery systolic pressure.   3. Left atrial size was moderately dilated. No left atrial/left atrial  appendage thrombus was detected.   4. Right atrial size was mildly dilated.   5. The mitral valve is grossly normal. Mild mitral valve regurgitation.   6. The aortic valve is tricuspid. There is mild thickening of the aortic  valve. Aortic valve regurgitation is not visualized. Mild aortic valve  sclerosis is present, with no evidence of aortic valve stenosis.   7. Agitated saline contrast bubble study was negative, with no evidence  of any interatrial shunt.   Labs/Other Tests and Data Reviewed:     Recent Labs: 12/15/2021: BUN 27; Creatinine, Ser 0.98; Hemoglobin 14.5; Magnesium 2.3; Platelets 228; Potassium 4.7; Sodium 142   Recent Lipid Panel Lab Results  Component Value Date/Time   CHOL 120 03/23/2019 08:38 AM   TRIG 42 03/23/2019 08:38 AM   HDL 50 03/23/2019 08:38 AM   CHOLHDL 2.4 03/23/2019 08:38 AM   CHOLHDL 3.0 06/29/2016 10:11 AM   LDLCALC 59 03/23/2019 08:38 AM   Dated 02/20/20: choleseterol 128, triglycerides 38, HDL 54, LDL 64. Dated 03/06/21: cholesterol 126, triglycerides 37, HDL 50, LDL 66. CMET, TSH, CBC normal.  Dated 03/23/22: cholesterol 118, triglycerides 42, HDL 43, LDL 64. CMET, TSH, CBC normal.    Wt Readings from Last 3 Encounters:  11/30/22 164 lb 3.2 oz (74.5 kg)  05/04/22 161 lb 9.6 oz (73.3 kg)  04/19/22 162 lb 6.4 oz (73.7 kg)     Objective:  Vital Signs:  BP 130/68 (BP Location: Left Arm, Patient Position: Sitting, Cuff Size:  Normal)   Pulse 70   Ht 6' (1.829 m)   Wt 164 lb 3.2 oz (74.5 kg)   SpO2 97%   BMI 22.27 kg/m    GENERAL:  Well appearing WM in NAD HEENT:  PERRL, EOMI, sclera are clear. Oropharynx is clear. NECK:  No jugular venous distention, carotid upstroke brisk and symmetric, no bruits, no thyromegaly or adenopathy LUNGS:  Clear to auscultation bilaterally CHEST:  Unremarkable HEART:  RRR,  PMI not displaced or sustained,S1 and S2 within normal limits, no S3, no S4: no clicks, no rubs, no murmurs ABD:  Soft, nontender. BS +, no masses or bruits. No hepatomegaly, no splenomegaly EXT:  2 + pulses throughout, no edema, no cyanosis no clubbing. No radial site hematoma SKIN:  Warm and dry.  No rashes NEURO:  Alert and oriented x 3. Cranial nerves II through XII intact. PSYCH:  Cognitively intact    ASSESSMENT & PLAN:    1.   CAD: s/p PCI of the LCx with DES in May 2020.  Prior stent of the mid LAD in 2011. Repeat cardiac cath in Dec 2020 showed nonobstructive disease.curreenlty angina free.  Antianginal therapy limited by orthostatic hypotension. He is asymptomatic now.  2.   PAF: s/p DCCV on October 15, 2019. Recurrent in Feb 2022 and March 2022. Now s/p Afib ablation in May 2022.  Continue Tikosyn.  Continue Xarelto therapy.   Unable to tolerate  rate control therapy with metoprolol  or amiodarone due to side effects. Maintaining NSR so far. Followed by Dr Graciela Husbands. Will update CBC, BMET and mag level today.  3.   Chronic orthostatic hypotension/ autonomic failure: intolerant of  higher dose of midodrine. Increased salt water intake. His blood pressure is stable. He has learned to manage it.     4.   Hyperlipidemia: Continue current statin. Excellent control.     Disposition:  6 month   Signed, Kala Gassmann [place], MD  11/30/2022 10:42 AM    [facility]

## 2022-11-30 ENCOUNTER — Encounter: Payer: Self-pay | Admitting: Cardiology

## 2022-11-30 ENCOUNTER — Ambulatory Visit: Payer: Medicare Other | Attending: Cardiology | Admitting: Cardiology

## 2022-11-30 ENCOUNTER — Telehealth: Payer: Self-pay | Admitting: Internal Medicine

## 2022-11-30 VITALS — BP 130/68 | HR 70 | Ht 72.0 in | Wt 164.2 lb

## 2022-11-30 DIAGNOSIS — I951 Orthostatic hypotension: Secondary | ICD-10-CM | POA: Diagnosis not present

## 2022-11-30 DIAGNOSIS — I2510 Atherosclerotic heart disease of native coronary artery without angina pectoris: Secondary | ICD-10-CM | POA: Diagnosis not present

## 2022-11-30 DIAGNOSIS — I480 Paroxysmal atrial fibrillation: Secondary | ICD-10-CM | POA: Insufficient documentation

## 2022-11-30 DIAGNOSIS — G909 Disorder of the autonomic nervous system, unspecified: Secondary | ICD-10-CM | POA: Insufficient documentation

## 2022-11-30 NOTE — Patient Instructions (Signed)
Medication Instructions:  Continue same medications *If you need a refill on your cardiac medications before your next appointment, please call your pharmacy*   Lab Work: Bmet,cbc,magnesium today   Testing/Procedures: None ordered   Follow-Up: At [facility], you and your health needs are our priority.  As part of our continuing mission to provide you with exceptional heart care, we have created designated Provider Care Teams.  These Care Teams include your primary Cardiologist (physician) and Advanced Practice Providers (APPs -  Physician Assistants and Nurse Practitioners) who all work together to provide you with the care you need, when you need it.  We recommend signing up for the patient portal called "MyChart".  Sign up information is provided on this After Visit Summary.  MyChart is used to connect with patients for Virtual Visits (Telemedicine).  Patients are able to view lab/test results, encounter notes, upcoming appointments, etc.  Non-urgent messages can be sent to your provider as well.   To learn more about what you can do with MyChart, go to [email or web address].    Your next appointment:  6 months    Provider:  Dr.Jordan

## 2022-11-30 NOTE — Telephone Encounter (Signed)
  Cardiology update: Called by the lab for a critical K level of 5.8. Sample not hemolyzed. Called the patient, no active complaints, no palpitations, no syncope/presyncope. I advised him to go to the ER to have K rechecked and treated.

## 2022-12-01 ENCOUNTER — Telehealth: Payer: Self-pay | Admitting: Cardiology

## 2022-12-01 ENCOUNTER — Other Ambulatory Visit: Payer: Self-pay

## 2022-12-01 ENCOUNTER — Emergency Department (HOSPITAL_COMMUNITY)
Admission: EM | Admit: 2022-12-01 | Discharge: 2022-12-01 | Disposition: A | Discharge status: Home / Self Care | Payer: Medicare Other | Attending: Emergency Medicine | Admitting: Emergency Medicine

## 2022-12-01 DIAGNOSIS — R531 Weakness: Secondary | ICD-10-CM | POA: Diagnosis not present

## 2022-12-01 DIAGNOSIS — Z7901 Long term (current) use of anticoagulants: Secondary | ICD-10-CM | POA: Diagnosis not present

## 2022-12-01 DIAGNOSIS — I440 Atrioventricular block, first degree: Secondary | ICD-10-CM | POA: Diagnosis not present

## 2022-12-01 LAB — BASIC METABOLIC PANEL
Anion gap: 7 (ref 5–15)
BUN/Creatinine Ratio: 20 (ref 10–24)
BUN: 21 mg/dL (ref 8–27)
BUN: 22 mg/dL (ref 8–23)
CO2: 26 mmol/L (ref 20–29)
CO2: 25 mmol/L (ref 22–32)
Calcium: 9.4 mg/dL (ref 8.6–10.2)
Calcium: 9 mg/dL (ref 8.9–10.3)
Chloride: 106 mmol/L (ref 96–106)
Chloride: 104 mmol/L (ref 98–111)
Creatinine, Ser: 1.07 mg/dL (ref 0.76–1.27)
Creatinine, Ser: 0.98 mg/dL (ref 0.61–1.24)
GFR, Estimated: >60 mL/min (ref >60)
Glucose, Bld: 90 mg/dL (ref 70–99)
Glucose: 74 mg/dL (ref 70–99)
Potassium: 5.8 mmol/L (ref 3.5–5.2)
Potassium: 4.1 mmol/L (ref 3.5–5.1)
Sodium: 142 mmol/L (ref 134–144)
Sodium: 136 mmol/L (ref 135–145)
eGFR: 70 mL/min/{1.73_m2} (ref >59)

## 2022-12-01 LAB — CBC WITH DIFFERENTIAL/PLATELET
Abs Immature Granulocytes: 0.01 10*3/uL (ref 0.00–0.07)
Basophils Absolute: 0.1 10*3/uL (ref 0.0–0.2)
Basophils Absolute: 0 10*3/uL (ref 0.0–0.1)
Basophils Relative: 1 %
Basos: 1 %
EOS (ABSOLUTE): 0.3 10*3/uL (ref 0.0–0.4)
Eos: 5 %
Eosinophils Absolute: 0.4 10*3/uL (ref 0.0–0.5)
Eosinophils Relative: 5 %
HCT: 41.7 % (ref 39.0–52.0)
Hematocrit: 43.7 % (ref 37.5–51.0)
Hemoglobin: 14.5 g/dL (ref 13.0–17.7)
Hemoglobin: 13.9 g/dL (ref 13.0–17.0)
Immature Grans (Abs): 0 10*3/uL (ref 0.0–0.1)
Immature Granulocytes: 0 %
Immature Granulocytes: 0 %
Lymphocytes Absolute: 1.2 10*3/uL (ref 0.7–3.1)
Lymphocytes Relative: 26 %
Lymphs Abs: 2 10*3/uL (ref 0.7–4.0)
Lymphs: 18 %
MCH: 30.6 pg (ref 26.6–33.0)
MCH: 30.3 pg (ref 26.0–34.0)
MCHC: 33.2 g/dL (ref 31.5–35.7)
MCHC: 33.3 g/dL (ref 30.0–36.0)
MCV: 92 fL (ref 79–97)
MCV: 90.8 fL (ref 80.0–100.0)
Monocytes Absolute: 0.7 10*3/uL (ref 0.1–0.9)
Monocytes Absolute: 0.9 10*3/uL (ref 0.1–1.0)
Monocytes Relative: 11 %
Monocytes: 10 %
Neutro Abs: 4.5 10*3/uL (ref 1.7–7.7)
Neutrophils Absolute: 4.2 10*3/uL (ref 1.4–7.0)
Neutrophils Relative %: 57 %
Neutrophils: 66 %
Platelets: 233 10*3/uL (ref 150–450)
Platelets: 211 10*3/uL (ref 150–400)
RBC: 4.74 x10E6/uL (ref 4.14–5.80)
RBC: 4.59 MIL/uL (ref 4.22–5.81)
RDW: 13.5 % (ref 11.6–15.4)
RDW: 14.4 % (ref 11.5–15.5)
WBC: 6.4 10*3/uL (ref 3.4–10.8)
WBC: 7.8 10*3/uL (ref 4.0–10.5)
nRBC: 0 % (ref 0.0–0.2)

## 2022-12-01 LAB — URINALYSIS, ROUTINE W REFLEX MICROSCOPIC
Bilirubin Urine: NEGATIVE
Glucose, UA: NEGATIVE mg/dL
Hgb urine dipstick: NEGATIVE
Ketones, ur: NEGATIVE mg/dL
Leukocytes,Ua: NEGATIVE
Nitrite: NEGATIVE
Protein, ur: NEGATIVE mg/dL
Specific Gravity, Urine: 1.01 (ref 1.005–1.030)
pH: 6 (ref 5.0–8.0)

## 2022-12-01 LAB — MAGNESIUM: Magnesium: 2.3 mg/dL (ref 1.6–2.3)

## 2022-12-01 LAB — TROPONIN I (HIGH SENSITIVITY): Troponin I (High Sensitivity): 5 ng/L (ref <18)

## 2022-12-01 NOTE — Telephone Encounter (Signed)
Pt called in stating he got a call late last night that about his potassium being too high. They told him to come in and get his lab redrawn and found out the labs drawn here were a false positive. They wanted him to make Dr. [place] aware.

## 2022-12-01 NOTE — Telephone Encounter (Signed)
Our office had patient do potassium. He got at midnight last night received call 5.8 potassium, dangerously high and needed to redo lab.  He went to ED approx 1am, had EKG and lab redrawn with result of  4.1. The physician at ED told him that original lab must have been false

## 2022-12-01 NOTE — ED Triage Notes (Signed)
Pt's cardiologist called him and tole him to go to ED because his K+ was 5.8. Labs drawn 11/30/22. Pt only complains of generalized weakness x 2 days. Pt has cardiac history

## 2022-12-01 NOTE — ED Provider Notes (Signed)
[place] EMERGENCY DEPARTMENT AT [facility] Provider Note   CSN: [id number] Arrival date & time: 12/01/22  0114     History  Chief Complaint  Patient presents with   Abnormal Lab    Jerry Barr is a 82 y.o. male.  Patient presents to the emergency department at the request of his cardiologist.  Patient saw his cardiologist today for routine follow-up and had routine blood work.  He was called at midnight and told that his potassium was high and that he should come to the emergency department to be checked.  Patient reports that he has been feeling some generalized weakness for the last couple of days, more than usual, but does not have any specific complaints.       Home Medications Prior to Admission medications   Medication Sig Start Date End Date Taking? Authorizing Provider  acetaminophen (TYLENOL) 500 MG tablet Take 500 mg by mouth every 6 (six) hours as needed for mild pain (or headaches).    [provider]  atorvastatin (LIPITOR) 20 MG tablet TAKE 1 TABLET BY MOUTH EVERY DAY 07/26/22   [place], Peter M, MD  cefdinir (OMNICEF) 300 MG capsule Take 300 mg by mouth 2 (two) times daily. 11/04/22   [provider]  Cholecalciferol (VITAMIN D-3) 1000 UNITS CAPS Take 1,000 Units by mouth daily.    [provider]  dofetilide (TIKOSYN) 500 MCG capsule TAKE ONE CAPSULE BY MOUTH TWICE A DAY 02/15/22   [place], Peter M, MD  loratadine (CLARITIN) 10 MG tablet Take 10 mg by mouth daily.    [provider]  Melatonin 3 MG TABS Take 6 mg by mouth at bedtime.    [provider]  midodrine (PROAMATINE) 5 MG tablet Take 5 mg by mouth as needed.    [provider]  Olopatadine HCl (PATADAY OP) Apply 1 drop to eye as needed.    [provider]  polyethylene glycol (MIRALAX / GLYCOLAX) 17 g packet Take 17 g by mouth daily as needed for mild constipation (MIX INTO 8 OUNCES OF WATER AND DRINK).    [provider]   XARELTO 20 MG TABS tablet TAKE 1 TABLET BY MOUTH EVERY DAY WITH BREAKFAST 08/23/22   [place], Peter M, MD      Allergies    Amiodarone, Metoprolol, and Penicillins    Review of Systems   Review of Systems  Physical Exam Updated Vital Signs BP 129/78   Pulse 72   Temp 98.3 F (36.8 C)   Resp 16   SpO2 99%  Physical Exam Vitals and nursing note reviewed.  Constitutional:      General: He is not in acute distress.    Appearance: He is well-developed.  HENT:     Head: Normocephalic and atraumatic.     Mouth/Throat:     Mouth: Mucous membranes are moist.  Eyes:     General: Vision grossly intact. Gaze aligned appropriately.     Extraocular Movements: Extraocular movements intact.     Conjunctiva/sclera: Conjunctivae normal.  Cardiovascular:     Rate and Rhythm: Normal rate and regular rhythm.     Pulses: Normal pulses.     Heart sounds: Normal heart sounds, S1 normal and S2 normal. No murmur heard.    No friction rub. No gallop.  Pulmonary:     Effort: Pulmonary effort is normal. No respiratory distress.     Breath sounds: Normal breath sounds.  Abdominal:     Palpations: Abdomen is soft.  Tenderness: There is no abdominal tenderness. There is no guarding or rebound.     Hernia: No hernia is present.  Musculoskeletal:        General: No swelling.     Cervical back: Full passive range of motion without pain, normal range of motion and neck supple. No pain with movement, spinous process tenderness or muscular tenderness. Normal range of motion.     Right lower leg: No edema.     Left lower leg: No edema.  Skin:    General: Skin is warm and dry.     Capillary Refill: Capillary refill takes less than 2 seconds.     Findings: No ecchymosis, erythema, lesion or wound.  Neurological:     Mental Status: He is alert and oriented to person, place, and time.     GCS: GCS eye subscore is 4. GCS verbal subscore is 5. GCS motor subscore is 6.     Cranial Nerves: Cranial nerves  2-12 are intact.     Sensory: Sensation is intact.     Motor: Motor function is intact. No weakness or abnormal muscle tone.     Coordination: Coordination is intact.  Psychiatric:        Mood and Affect: Mood normal.        Speech: Speech normal.        Behavior: Behavior normal.     ED Results / Procedures / Treatments   Labs (all labs ordered are listed, but only abnormal results are displayed) Labs Reviewed  URINALYSIS, ROUTINE W REFLEX MICROSCOPIC - Abnormal; Notable for the following components:      Result Value   Color, Urine STRAW (*)    All other components within normal limits  CBC WITH DIFFERENTIAL/PLATELET  BASIC METABOLIC PANEL  TROPONIN I (HIGH SENSITIVITY)    EKG EKG Interpretation  Date/Time:  Wednesday Dec 01 2022 01:24:51 EDT Ventricular Rate:  70 PR Interval:    QRS Duration: 102 QT Interval:  415 QTC Calculation: 448 R Axis:   54 Text Interpretation: Sinus rhythm Normal ECG No significant change since last tracing Confirmed by Gilda Crease [phone]) on 12/01/2022 2:03:30 AM  Radiology No results found.  Procedures Procedures    Medications Ordered in ED Medications - No data to display  ED Course/ Medical Decision Making/ A&P                             Medical Decision Making Amount and/or Complexity of Data Reviewed Labs: ordered.   Patient referred to the emergency department for evaluation of elevated potassium seen on outpatient labs.  Labs were performed by his cardiologist at routine follow-up.  Patient without specific complaints other than saying that he feels more tired and weaker than usual the last couple of days.  No chest pain.  No cough or infectious symptoms.  No nausea, vomiting, diarrhea.  No unilateral or strokelike symptoms.  Lab work repeated.  CBC and be met are normal including normal potassium.  No Anemia. Previously seen elevated potassium likely lab error.  He has normal kidney function and is not on  potassium supplementation.  Urinalysis is normal.  EKG unremarkable, troponin negative.  Patient reassured, no further workup necessary.        Final Clinical Impression(s) / ED Diagnoses Final diagnoses:  Weakness    Rx / DC Orders ED Discharge Orders     None  Gilda Crease, MD 12/01/22 [phone]

## 2022-12-07 DIAGNOSIS — R350 Frequency of micturition: Secondary | ICD-10-CM | POA: Diagnosis not present

## 2022-12-07 DIAGNOSIS — R3912 Poor urinary stream: Secondary | ICD-10-CM | POA: Diagnosis not present

## 2022-12-07 DIAGNOSIS — R300 Dysuria: Secondary | ICD-10-CM | POA: Diagnosis not present

## 2022-12-09 DIAGNOSIS — R3912 Poor urinary stream: Secondary | ICD-10-CM | POA: Diagnosis not present

## 2022-12-09 DIAGNOSIS — R300 Dysuria: Secondary | ICD-10-CM | POA: Diagnosis not present

## 2022-12-10 ENCOUNTER — Telehealth: Payer: Self-pay | Admitting: Cardiology

## 2022-12-10 NOTE — Telephone Encounter (Signed)
Pt c/o medication issue:  1. Name of Medication:  dofetilide (TIKOSYN) 500 MCG capsule  Alfuzosin    2. How are you currently taking this medication (dosage and times per day)? Taking tikosyn as written. Pt is unaware of the dosage for Alfuzosin  3. Are you having a reaction (difficulty breathing--STAT)? No   4. What is your medication issue? Pt states his Urologist wants to start him on Alfuzosin however pharmacist told him to consult with Cardiology first because there is a significant drug interaction between the two meds. Please advise.

## 2022-12-10 NOTE — Telephone Encounter (Signed)
Advised patient that waiting on response from Dr Graciela Husbands, as had noted earlier to him, Dr Graciela Husbands is on vacation.  He will wait to hear from him next week.He is also aware of appt June 6 .

## 2022-12-10 NOTE — Telephone Encounter (Signed)
Patient has appointment with Francis Dowse PA 6/6 at 8:25 am.

## 2022-12-10 NOTE — Telephone Encounter (Signed)
Patient went to Urology and they want him to start on new medication Alfuzosin.  This would be to take along with dofetilide.  He wanted to ensure this is OK due to warnings of heart rhythms  and BP issues with combination. He will not pick up medication until you say it is OK to take as he does not want any more heart problems. Please advise

## 2022-12-13 NOTE — Telephone Encounter (Signed)
Alfuzosin and Tikosyn interact - increased risk of QTc prolongation. Would see if there is another med urology feels would be an appropriate substitute for alfuzosin to avoid drug interaction if possible.

## 2022-12-14 ENCOUNTER — Other Ambulatory Visit: Payer: Self-pay | Admitting: Urology

## 2022-12-14 NOTE — Telephone Encounter (Signed)
Spoke with pt and advised of medication review and recommendation below per pharm-d.  Pt states he is scheduled for a cystoscopy at the end of June and will wait results of that test before discussing any additional medications.  Pt verbalizes understadning and states he will not take Afluzosin.  Pt also requested RN cancel his 12/16/2022 appointment with Francis Dowse, PA-C and states he will reschedule if needed after cystoscopy.

## 2022-12-15 ENCOUNTER — Telehealth: Payer: Self-pay

## 2022-12-15 NOTE — Patient Instructions (Addendum)
DUE TO COVID-19 ONLY TWO VISITORS  (aged 81 and older)  ARE ALLOWED TO COME WITH YOU AND STAY IN THE WAITING ROOM ONLY DURING PRE OP AND PROCEDURE.   **NO VISITORS ARE ALLOWED IN THE SHORT STAY AREA OR RECOVERY ROOM!!**  IF YOU WILL BE ADMITTED INTO THE HOSPITAL YOU ARE ALLOWED ONLY FOUR SUPPORT PEOPLE DURING VISITATION HOURS ONLY (7 AM -8PM)   The support person(s) must pass our screening, gel in and out, and wear a mask at all times, including in the patient's room. Patients must also wear a mask when staff or their support person are in the room. Visitors GUEST BADGE MUST BE WORN VISIBLY  One adult visitor may remain with you overnight and MUST be in the room by 8 P.M.     Your procedure is scheduled on: 12/30/22   Report to [facility] Main Entrance    Report to admitting at   8:45 AM   Call this number if you have problems the morning of surgery [phone]   Do not eat food or drink :After Midnight.            If you have questions, please contact your surgeon's office.     Oral Hygiene is also important to reduce your risk of infection.                                    Remember - BRUSH YOUR TEETH THE MORNING OF SURGERY WITH YOUR REGULAR TOOTHPASTE  DENTURES WILL BE REMOVED PRIOR TO SURGERY PLEASE DO NOT APPLY "Poly grip" OR ADHESIVES!!!   Do NOT smoke after Midnight   Take these medicines the morning of surgery with A SIP OF WATER: Dofetilide-Tikosyn                                                                                                                            Loratadine                                                                                                                            Atorvastatin   Before surgery.Stop taking _Xarelto_72  (5 days ) prior to surgery. Last dose_on ___6/14/24_______as instructed by Marcelle Smiling Baker_____________.   Contact your Surgeon/Cardiologist for instructions on Anticoagulant Therapy prior to  surgery.   Bring CPAP mask and tubing day of surgery.  You may not have any metal on your body including  jewelry, and body piercing             Do not wear lotions, powders, cologne, or deodorant               Men may shave face and neck.   Do not bring valuables to the hospital. [place] IS NOT             RESPONSIBLE   FOR VALUABLES.   Contacts, glasses, or bridgework may not be worn into surgery.   DO NOT BRING YOUR HOME MEDICATIONS TO THE HOSPITAL.    Patients discharged on the day of surgery will not be allowed to drive home.  Someone NEEDS to stay with you for the first 24 hours after anesthesia.   Special Instructions: Bring a copy of your healthcare power of attorney and living will documents  the day of surgery if you haven't scanned them before.              Please read over the following fact sheets you were given: IF YOU HAVE QUESTIONS ABOUT YOUR PRE-OP INSTRUCTIONS PLEASE CALL [phone]    [facility] - Preparing for Surgery Before surgery, you can play an important role.  Because skin is not sterile, your skin needs to be as free of germs as possible.  You can reduce the number of germs on your skin by washing with CHG (chlorahexidine gluconate) soap before surgery.  CHG is an antiseptic cleaner which kills germs and bonds with the skin to continue killing germs even after washing. Please DO NOT use if you have an allergy to CHG or antibacterial soaps.  If your skin becomes reddened/irritated stop using the CHG and inform your nurse when you arrive at Short Stay.  You may shave your face/neck. Please follow these instructions carefully:  1.  Shower with CHG Soap the night before surgery and the  morning of Surgery.  2.  If you choose to wash your hair, wash your hair first as usual with your  normal  shampoo.  3.  After you shampoo, rinse your hair and body thoroughly to remove the  shampoo.                            4.  Use CHG as  you would any other liquid soap.  You can apply chg directly  to the skin and wash                       Gently with a scrungie or clean washcloth.  5.  Apply the CHG Soap to your body ONLY FROM THE NECK DOWN.   Do not use on face/ open                           Wound or open sores. Avoid contact with eyes, ears mouth and genitals (private parts).                       Wash face,  Genitals (private parts) with your normal soap.             6.  Wash thoroughly, paying special attention to the area where your surgery  will be performed.  7.  Thoroughly rinse your body with warm water from the neck down.  8.  DO NOT  shower/wash with your normal soap after using and rinsing off  the CHG Soap.              9.  Pat yourself dry with a clean towel.            10.  Wear clean pajamas.            11.  Place clean sheets on your bed the night of your first shower and do not  sleep with pets. Day of Surgery : Do not apply any lotions/deodorants the morning of surgery.  Please wear clean clothes to the hospital/surgery center.  FAILURE TO FOLLOW THESE INSTRUCTIONS MAY RESULT IN THE CANCELLATION OF YOUR SURGERY   PATIENT SIGNATURE_________________________________  ________________________________________________________________________

## 2022-12-15 NOTE — Telephone Encounter (Signed)
   Pre-operative Risk Assessment    Patient Name: Jerry Barr  DOB: February 21, 1941 MRN: [id number]      Request for Surgical Clearance    Procedure:   CYSTO, BLADDER BX; WITH FULGURATION, AND B/L RPGS  Date of Surgery:  Clearance 12/30/22                                 Surgeon:  DR. Berniece Salines  Surgeon's Group or Practice Name:  [facility] Phone number:  [phone] Fax number:  [phone]   Type of Clearance Requested:   - Pharmacy:  Hold Rivaroxaban (Xarelto) REQUEST AN AVERAGE OF 5 DAYS DISCONTINUATION PRIOR TO SURGERY     Type of Anesthesia:  Not Indicated   Additional requests/questions:    SignedMichaelle Copas   12/15/2022, 11:25 AM

## 2022-12-15 NOTE — Telephone Encounter (Signed)
Please advise holding Xarelto prior to cystoscopy, bladder biopsy with fulguration and B/L RPGS.  Thank you!  DW

## 2022-12-16 ENCOUNTER — Ambulatory Visit: Payer: Medicare Other | Admitting: Physician Assistant

## 2022-12-16 NOTE — Telephone Encounter (Signed)
Patient with diagnosis of PAF on Xarelto for anticoagulation.    Procedure: CYSTO, BLADDER BX; WITH FULGURATION, AND B/L RPGS  Date of procedure: 12/30/2022   CHA2DS2-VASc Score = 4   This indicates a 4.8% annual risk of stroke. The patient's score is based upon: CHF History: 0 HTN History: 1 Diabetes History: 0 Stroke History: 0 Vascular Disease History: 1 Age Score: 2 Gender Score: 0     CrCl 61 mL/min (12/01/2022 Sr Cr 0.98) Platelet count 211    Per office protocol, patient can hold Xarelto  for 3 days prior to procedure.     **This guidance is not considered finalized until pre-operative APP has relayed final recommendations.**

## 2022-12-16 NOTE — Telephone Encounter (Signed)
   Patient Name: Jerry Barr  DOB: February 21, 1941 MRN: [id number]  Primary Cardiologist: Peter Swaziland, MD  Chart reviewed as part of pre-operative protocol coverage. Pre-op clearance already addressed by colleagues in earlier phone notes. To summarize recommendations:  -I already messaged Dr Marlou Porch and told him he was cleared for procedure. Can hold anticoagulation for procedure  -Dr. Swaziland  Will route this bundled recommendation to requesting provider via Epic fax function and remove from pre-op pool. Please call with questions.  Sharlene Dory, PA-C 12/16/2022, 1:42 PM

## 2022-12-21 ENCOUNTER — Other Ambulatory Visit: Payer: Self-pay

## 2022-12-21 ENCOUNTER — Encounter (HOSPITAL_COMMUNITY): Payer: Self-pay

## 2022-12-21 ENCOUNTER — Encounter (HOSPITAL_COMMUNITY)
Admission: RE | Admit: 2022-12-21 | Discharge: 2022-12-21 | Disposition: A | Discharge status: Home / Self Care | Payer: Medicare Other | Source: Ambulatory Visit | Attending: Urology | Admitting: Urology

## 2022-12-21 DIAGNOSIS — Z01818 Encounter for other preprocedural examination: Secondary | ICD-10-CM | POA: Insufficient documentation

## 2022-12-21 DIAGNOSIS — I4891 Unspecified atrial fibrillation: Secondary | ICD-10-CM | POA: Diagnosis not present

## 2022-12-21 DIAGNOSIS — I2510 Atherosclerotic heart disease of native coronary artery without angina pectoris: Secondary | ICD-10-CM | POA: Insufficient documentation

## 2022-12-21 DIAGNOSIS — I951 Orthostatic hypotension: Secondary | ICD-10-CM | POA: Diagnosis not present

## 2022-12-21 DIAGNOSIS — N329 Bladder disorder, unspecified: Secondary | ICD-10-CM | POA: Insufficient documentation

## 2022-12-21 NOTE — Progress Notes (Signed)
Anesthesia note:   PCP - Dr. Tally Joe Cardiologist -Dr. Peter Swaziland Other- Dr. Sherryl Manges  Chest x-ray - no EKG - 12/01/22-epic Stress Test - 2018 ECHO - 08/27/20 Cardiac Cath - 11/15/2018 with stents, 06/25/2019 Ablation for a-fib 11/18/20 Pacemaker/ICD device last checked:NA  Sleep Study - no. He has REM sleep disorder CPAP - no  Pt is pre diabetic-no CBG at PAT visit- Fasting Blood Sugar at home- Checks Blood Sugar _____  Blood Thinner:Xarelto for A-fib Blood Thinner Instructions:hold for 3 days Aspirin Instructions: Last Dose:12/26/22  Anesthesia review: Yes  reason:  Patient denies shortness of breath, fever, cough and chest pain at PAT appointment. Pt has no SOB with activities. Labs were done 12/01/22. Clearance in epic 12/15/22   Patient verbalized understanding of instructions that were given to them at the PAT appointment. Patient was also instructed that they will need to review over the PAT instructions again at home before surgery.yes

## 2022-12-22 NOTE — Progress Notes (Signed)
Anesthesia Chart Review   Case: [id number] Date/Time: 12/30/22 1045   Procedures:      CYSTOSCOPY WITH BLADDER BIOPSY - 45 MINUTES     CYSTOSCOPY WITH FULGERATION     CYSTOSCOPY WITH BILATERAL RETROGRADE PYELOGRAM (Bilateral)   Anesthesia type: General   Pre-op diagnosis:      DYSURIA     BLADDER LESION AT BLADDER NECK   Location: [facility] PROCEDURE [facility] / [facility]   Surgeons: Crist Fat, MD       DISCUSSION:82 y.o. never smoker with h/o atrial fibrillation, CAD (DES to LAD 2011), chronic orthostatic hypotension, bladder lesion scheduled for above procedure 12/30/2022 with Dr. Berniece Salines.   Per cardiology pt can proceed with urology procedure, may hold Xarelto 3 days prior to procedure.   Nonobstructive CAD and patent stent on cath 2020.  VS: BP 100/72   Pulse 77   Temp 37 C (Oral)   Resp 18   Ht 6' (1.829 m)   Wt 73.5 kg   SpO2 95%   BMI 21.97 kg/m   PROVIDERS: Tally Joe, MD is PCP   Cardiologist -Dr. Peter Swaziland  LABS: Labs reviewed: Acceptable for surgery. (all labs ordered are listed, but only abnormal results are displayed)  Labs Reviewed - No data to display   IMAGES:   EKG:   CV: Echo 08/27/2020 1. Left ventricular ejection fraction, by estimation, is 55 to 60%. The  left ventricle has normal function.   2. Right ventricular systolic function is normal. The right ventricular  size is normal. There is normal pulmonary artery systolic pressure.   3. Left atrial size was moderately dilated. No left atrial/left atrial  appendage thrombus was detected.   4. Right atrial size was mildly dilated.   5. The mitral valve is grossly normal. Mild mitral valve regurgitation.   6. The aortic valve is tricuspid. There is mild thickening of the aortic  valve. Aortic valve regurgitation is not visualized. Mild aortic valve  sclerosis is present, with no evidence of aortic valve stenosis.   7. Agitated saline contrast bubble study was negative, with no  evidence  of any interatrial shunt.   Cardiac Cath 06/25/2019 Non-stenotic Prox LAD to Mid LAD lesion was previously treated. Previously placed Mid Cx drug eluting stent is widely patent. Balloon angioplasty was performed. Prox RCA lesion is 20% stenosed. The left ventricular systolic function is normal. LV end diastolic pressure is normal. The left ventricular ejection fraction is 55-65% by visual estimate.   1. Nonobstructive CAD- patent stents in LAD and LCx. 2. Normal LV function 3. Normal LVEDP   Plan: continue medical therapy.   Past Medical History:  Diagnosis Date   Allergy    Aortic atherosclerosis (HCC)    Atrial fibrillation (HCC)    BPH (benign prostatic hyperplasia)    CAD (coronary artery disease)    Cancer (HCC)    basal cell ca - face, nose and right leg   Coronary artery disease    Promus drug-eluting stent to a 99% mid LAD, 40-50% narrowing in the distal LAD with mild to moderate disease in the circ and RCA b. 5/6 PCI/DESx1 to mLcx   Dyslipidemia    ED (erectile dysfunction)    Hearing loss    Hyperplastic colon polyp    Hypoglycemia    Orthostatic hypotension    REM sleep behavior disorder    not tested    Past Surgical History:  Procedure Laterality Date   ATRIAL FIBRILLATION ABLATION N/A 11/18/2020  Procedure: ATRIAL FIBRILLATION ABLATION;  Surgeon: Hillis Range, MD;  Location: [facility] INVASIVE CV LAB;  Service: Cardiovascular;  Laterality: N/A;   BUBBLE STUDY  08/27/2020   Procedure: BUBBLE STUDY;  Surgeon: Meriam Sprague, MD;  Location: [facility] ENDOSCOPY;  Service: Cardiovascular;;   CARDIOVERSION N/A 04/23/2016   Procedure: CARDIOVERSION;  Surgeon: Jake Bathe, MD;  Location: [facility] ENDOSCOPY;  Service: Cardiovascular;  Laterality: N/A;   CARDIOVERSION N/A 08/21/2020   Procedure: CARDIOVERSION;  Surgeon: Lars Masson, MD;  Location: [facility] ENDOSCOPY;  Service: Cardiovascular;  Laterality: N/A;   CARDIOVERSION N/A 08/29/2020   Procedure:  CARDIOVERSION;  Surgeon: Thurmon Fair, MD;  Location: [facility] ENDOSCOPY;  Service: Cardiovascular;  Laterality: N/A;   CARDIOVERSION N/A 09/24/2020   Procedure: CARDIOVERSION;  Surgeon: Meriam Sprague, MD;  Location: [facility] ENDOSCOPY;  Service: Cardiovascular;  Laterality: N/A;   COLONOSCOPY     CORONARY ANGIOPLASTY WITH STENT PLACEMENT     CORONARY STENT INTERVENTION N/A 11/15/2018   Procedure: CORONARY STENT INTERVENTION;  Surgeon: Kathleene Hazel, MD;  Location: [facility] INVASIVE CV LAB;  Service: Cardiovascular;  Laterality: N/A;   LEFT HEART CATH AND CORONARY ANGIOGRAPHY N/A 11/15/2018   Procedure: LEFT HEART CATH AND CORONARY ANGIOGRAPHY;  Surgeon: Kathleene Hazel, MD;  Location: [facility] INVASIVE CV LAB;  Service: Cardiovascular;  Laterality: N/A;   LEFT HEART CATH AND CORONARY ANGIOGRAPHY N/A 06/25/2019   Procedure: LEFT HEART CATH AND CORONARY ANGIOGRAPHY;  Surgeon: Swaziland, Peter M, MD;  Location: [facility] INVASIVE CV LAB;  Service: Cardiovascular;  Laterality: N/A;   POLYPECTOMY     TEE WITHOUT CARDIOVERSION N/A 04/23/2016   Procedure: TRANSESOPHAGEAL ECHOCARDIOGRAM (TEE);  Surgeon: Jake Bathe, MD;  Location: [facility] ENDOSCOPY;  Service: Cardiovascular;  Laterality: N/A;   TEE WITHOUT CARDIOVERSION N/A 08/27/2020   Procedure: TRANSESOPHAGEAL ECHOCARDIOGRAM (TEE);  Surgeon: Meriam Sprague, MD;  Location: [facility] ENDOSCOPY;  Service: Cardiovascular;  Laterality: N/A;   UPPER GASTROINTESTINAL ENDOSCOPY      MEDICATIONS:  acetaminophen (TYLENOL) 500 MG tablet   atorvastatin (LIPITOR) 20 MG tablet   Cholecalciferol (VITAMIN D-3) 1000 UNITS CAPS   dofetilide (TIKOSYN) 500 MCG capsule   loratadine (CLARITIN) 10 MG tablet   Melatonin 3 MG TABS   midodrine (PROAMATINE) 5 MG tablet   olopatadine (PATANOL) 0.1 % ophthalmic solution   phenazopyridine (PYRIDIUM) 200 MG tablet   polyethylene glycol (MIRALAX / GLYCOLAX) 17 g packet   XARELTO 20 MG TABS tablet   No current facility-administered  medications for this encounter.   Jodell Cipro Ward, PA-C [facility]Surgical Testing [phone]

## 2022-12-22 NOTE — Anesthesia Preprocedure Evaluation (Addendum)
Anesthesia Evaluation  Patient identified by MRN, date of birth, ID band Patient awake    Reviewed: Allergy & Precautions, NPO status , Patient's Chart, lab work & pertinent test results, reviewed documented beta blocker date and time   History of Anesthesia Complications Negative for: history of anesthetic complications  Airway Mallampati: I  TM Distance: >3 FB Neck ROM: Full    Dental no notable dental hx. (+) Dental Advisory Given, Teeth Intact, Caps   Pulmonary    Pulmonary exam normal breath sounds clear to auscultation       Cardiovascular (-) angina + CAD and + Cardiac Stents  Normal cardiovascular exam+ dysrhythmias Atrial Fibrillation  Rhythm:Regular Rate:Normal   Hx/o Atrial Fibrillation Tikosyn therapy  DES x 1 mid LCx  EKG 12/01/22 NSR, Normal  Echo 08/27/20 1. Left ventricular ejection fraction, by estimation, is 55 to 60%. The  left ventricle has normal function.   2. Right ventricular systolic function is normal. The right ventricular  size is normal. There is normal pulmonary artery systolic pressure.   3. Left atrial size was moderately dilated. No left atrial/left atrial  appendage thrombus was detected.   4. Right atrial size was mildly dilated.   5. The mitral valve is grossly normal. Mild mitral valve regurgitation.   6. The aortic valve is tricuspid. There is mild thickening of the aortic  valve. Aortic valve regurgitation is not visualized. Mild aortic valve  sclerosis is present, with no evidence of aortic valve stenosis.   7. Agitated saline contrast bubble study was negative, with no evidence  of any interatrial shunt.   Cardiac Cath 06/25/19  Non-stenotic Prox LAD to Mid LAD lesion was previously treated.  Previously placed Mid Cx drug eluting stent is widely patent.  Balloon angioplasty was performed.  Prox RCA lesion is 20% stenosed.  The left ventricular systolic function is  normal.  LV end diastolic pressure is normal.  The left ventricular ejection fraction is 55-65% by visual estimate.   1. Nonobstructive CAD- patent stents in LAD and LCx. 2. Normal LV function 3. Normal LVEDP      Neuro/Psych negative neurological ROS  negative psych ROS   GI/Hepatic Neg liver ROS,GERD  Controlled,,  Endo/Other  Hyperlipidemia  Renal/GU negative Renal ROS   Bladder lesion of bladder neck Dysuria BPH    Musculoskeletal negative musculoskeletal ROS (+)    Abdominal   Peds  Hematology Xarelto therapy   Anesthesia Other Findings   Reproductive/Obstetrics ED                              Anesthesia Physical Anesthesia Plan  ASA: 3  Anesthesia Plan: General   Post-op Pain Management: Minimal or no pain anticipated and Dilaudid IV   Induction: Intravenous  PONV Risk Score and Plan: 4 or greater and Ondansetron, Dexamethasone and Treatment may vary due to age or medical condition  Airway Management Planned: LMA  Additional Equipment: None  Intra-op Plan:   Post-operative Plan: Extubation in OR  Informed Consent: I have reviewed the patients History and Physical, chart, labs and discussed the procedure including the risks, benefits and alternatives for the proposed anesthesia with the patient or authorized representative who has indicated his/her understanding and acceptance.     Dental advisory given  Plan Discussed with: CRNA, Surgeon and Anesthesiologist  Anesthesia Plan Comments: (See PAT note 12/21/2022)         Anesthesia Quick Evaluation

## 2022-12-30 ENCOUNTER — Ambulatory Visit (HOSPITAL_BASED_OUTPATIENT_CLINIC_OR_DEPARTMENT_OTHER): Payer: Medicare Other | Admitting: Certified Registered Nurse Anesthetist

## 2022-12-30 ENCOUNTER — Encounter (HOSPITAL_COMMUNITY): Payer: Self-pay | Admitting: Urology

## 2022-12-30 ENCOUNTER — Encounter (HOSPITAL_COMMUNITY)
Admission: RE | Disposition: A | Discharge status: Home / Self Care | Payer: Self-pay | Source: Home / Self Care | Attending: Urology

## 2022-12-30 ENCOUNTER — Ambulatory Visit (HOSPITAL_COMMUNITY): Payer: Medicare Other

## 2022-12-30 ENCOUNTER — Other Ambulatory Visit: Payer: Self-pay

## 2022-12-30 ENCOUNTER — Ambulatory Visit (HOSPITAL_COMMUNITY)
Admission: RE | Admit: 2022-12-30 | Discharge: 2022-12-30 | Disposition: A | Discharge status: Home / Self Care | Payer: Medicare Other | Attending: Urology | Admitting: Urology

## 2022-12-30 ENCOUNTER — Ambulatory Visit (HOSPITAL_COMMUNITY): Payer: Medicare Other | Admitting: Physician Assistant

## 2022-12-30 DIAGNOSIS — G479 Sleep disorder, unspecified: Secondary | ICD-10-CM | POA: Insufficient documentation

## 2022-12-30 DIAGNOSIS — I4891 Unspecified atrial fibrillation: Secondary | ICD-10-CM | POA: Diagnosis not present

## 2022-12-30 DIAGNOSIS — Z7901 Long term (current) use of anticoagulants: Secondary | ICD-10-CM | POA: Insufficient documentation

## 2022-12-30 DIAGNOSIS — R300 Dysuria: Secondary | ICD-10-CM | POA: Diagnosis not present

## 2022-12-30 DIAGNOSIS — I2510 Atherosclerotic heart disease of native coronary artery without angina pectoris: Secondary | ICD-10-CM

## 2022-12-30 DIAGNOSIS — N3289 Other specified disorders of bladder: Secondary | ICD-10-CM

## 2022-12-30 DIAGNOSIS — N329 Bladder disorder, unspecified: Secondary | ICD-10-CM | POA: Insufficient documentation

## 2022-12-30 DIAGNOSIS — Z955 Presence of coronary angioplasty implant and graft: Secondary | ICD-10-CM | POA: Diagnosis not present

## 2022-12-30 DIAGNOSIS — K219 Gastro-esophageal reflux disease without esophagitis: Secondary | ICD-10-CM | POA: Diagnosis not present

## 2022-12-30 HISTORY — PX: CYSTOSCOPY WITH BIOPSY: SHX5122

## 2022-12-30 HISTORY — PX: CYSTOSCOPY WITH FULGERATION: SHX6638

## 2022-12-30 HISTORY — PX: CYSTOSCOPY W/ RETROGRADES: SHX1426

## 2022-12-30 SURGERY — CYSTOSCOPY, WITH BIOPSY
Anesthesia: General | Site: Urethra

## 2022-12-30 MED ORDER — OXYCODONE HCL 5 MG/5ML PO SOLN: 5.0000 mg | Freq: Once | ORAL | Status: AC | PRN

## 2022-12-30 MED ORDER — STERILE WATER FOR IRRIGATION IR SOLN
Status: DC | PRN
  Administered 2022-12-30: 12000 mL

## 2022-12-30 MED ORDER — PROPOFOL 500 MG/50ML IV EMUL
INTRAVENOUS | Status: DC | PRN
  Administered 2022-12-30: 110 mg via INTRAVENOUS

## 2022-12-30 MED ORDER — DEXAMETHASONE SODIUM PHOSPHATE 10 MG/ML IJ SOLN
INTRAMUSCULAR | Status: AC
  Filled 2022-12-30: qty 1

## 2022-12-30 MED ORDER — ONDANSETRON HCL 4 MG/2ML IJ SOLN
INTRAMUSCULAR | Status: AC
  Filled 2022-12-30: qty 2

## 2022-12-30 MED ORDER — ONDANSETRON HCL 4 MG/2ML IJ SOLN: 4.0000 mg | Freq: Once | INTRAMUSCULAR | Status: DC | PRN

## 2022-12-30 MED ORDER — PHENAZOPYRIDINE HCL 200 MG PO TABS
ORAL_TABLET | ORAL | Status: AC
  Filled 2022-12-30: qty 1

## 2022-12-30 MED ORDER — PROPOFOL 10 MG/ML IV BOLUS
INTRAVENOUS | Status: AC
  Filled 2022-12-30: qty 20

## 2022-12-30 MED ORDER — CIPROFLOXACIN IN D5W 400 MG/200ML IV SOLN
400.0000 mg | INTRAVENOUS | Status: DC
  Filled 2022-12-30: qty 200

## 2022-12-30 MED ORDER — ONDANSETRON HCL 4 MG/2ML IJ SOLN
INTRAMUSCULAR | Status: DC | PRN
  Administered 2022-12-30: 4 mg via INTRAVENOUS

## 2022-12-30 MED ORDER — IOHEXOL 300 MG/ML  SOLN
INTRAMUSCULAR | Status: DC | PRN
  Administered 2022-12-30: 20 mL

## 2022-12-30 MED ORDER — OXYCODONE HCL 5 MG PO TABS
5.0000 mg | ORAL_TABLET | Freq: Once | ORAL | Status: AC | PRN
  Administered 2022-12-30: 5 mg via ORAL

## 2022-12-30 MED ORDER — CHLORHEXIDINE GLUCONATE 0.12 % MT SOLN
15.0000 mL | Freq: Once | OROMUCOSAL | Status: AC
  Administered 2022-12-30: 15 mL via OROMUCOSAL

## 2022-12-30 MED ORDER — ORAL CARE MOUTH RINSE: 15.0000 mL | Freq: Once | OROMUCOSAL | Status: AC

## 2022-12-30 MED ORDER — OXYCODONE HCL 5 MG PO TABS
ORAL_TABLET | ORAL | Status: AC
  Filled 2022-12-30: qty 1

## 2022-12-30 MED ORDER — LIDOCAINE HCL URETHRAL/MUCOSAL 2 % EX GEL
1.0000 | Freq: Once | CUTANEOUS | Status: AC
  Administered 2022-12-30: 1 via TOPICAL

## 2022-12-30 MED ORDER — DEXAMETHASONE SODIUM PHOSPHATE 10 MG/ML IJ SOLN
INTRAMUSCULAR | Status: DC | PRN
  Administered 2022-12-30: 10 mg via INTRAVENOUS

## 2022-12-30 MED ORDER — LIDOCAINE HCL URETHRAL/MUCOSAL 2 % EX GEL
CUTANEOUS | Status: AC
  Filled 2022-12-30: qty 11

## 2022-12-30 MED ORDER — HYDROMORPHONE HCL 1 MG/ML IJ SOLN
INTRAMUSCULAR | Status: AC
  Filled 2022-12-30: qty 1

## 2022-12-30 MED ORDER — PHENAZOPYRIDINE HCL 200 MG PO TABS
200.0000 mg | ORAL_TABLET | Freq: Once | ORAL | Status: AC
  Administered 2022-12-30: 200 mg via ORAL

## 2022-12-30 MED ORDER — LIDOCAINE 2% (20 MG/ML) 5 ML SYRINGE
INTRAMUSCULAR | Status: DC | PRN
  Administered 2022-12-30: 60 mg via INTRAVENOUS

## 2022-12-30 MED ORDER — FENTANYL CITRATE (PF) 100 MCG/2ML IJ SOLN
INTRAMUSCULAR | Status: AC
  Filled 2022-12-30: qty 2

## 2022-12-30 MED ORDER — LACTATED RINGERS IV SOLN: INTRAVENOUS | Status: DC

## 2022-12-30 MED ORDER — HYDROMORPHONE HCL 1 MG/ML IJ SOLN
0.2500 mg | INTRAMUSCULAR | Status: DC | PRN
  Administered 2022-12-30 (×2): 0.5 mg via INTRAVENOUS

## 2022-12-30 SURGICAL SUPPLY — 28 items
BAG COUNTER SPONGE SURGICOUNT (BAG) IMPLANT
BAG SPNG CNTER NS LX DISP (BAG)
BAG URO CATCHER STRL LF (MISCELLANEOUS) ×3 IMPLANT
CATH FOLEY 2WAY SLVR 5CC 20FR (CATHETERS) IMPLANT
CATH URETL OPEN 5X70 (CATHETERS) ×3 IMPLANT
CLOTH BEACON ORANGE TIMEOUT ST (SAFETY) ×3 IMPLANT
DRAPE FOOT SWITCH (DRAPES) ×3 IMPLANT
ELECT REM PT RETURN 15FT ADLT (MISCELLANEOUS) ×3 IMPLANT
GLOVE SURG LX STRL 7.5 STRW (GLOVE) ×3 IMPLANT
GOWN STRL REUS W/ TWL XL LVL3 (GOWN DISPOSABLE) ×3 IMPLANT
GOWN STRL REUS W/TWL XL LVL3 (GOWN DISPOSABLE) ×2
GUIDEWIRE STR DUAL SENSOR (WIRE) ×3 IMPLANT
HOLDER FOLEY CATH W/STRAP (MISCELLANEOUS) IMPLANT
KIT TURNOVER KIT A (KITS) IMPLANT
LASER FIB FLEXIVA PULSE ID 365 (Laser) IMPLANT
LASER FIB FLEXIVA PULSE ID 550 (Laser) IMPLANT
LASER FIB FLEXIVA PULSE ID 910 (Laser) IMPLANT
LOOP CUT BIPOLAR 24F LRG (ELECTROSURGICAL) IMPLANT
MANIFOLD NEPTUNE II (INSTRUMENTS) ×3 IMPLANT
NDL SAFETY ECLIP 18X1.5 (MISCELLANEOUS) ×3 IMPLANT
NS IRRIG 1000ML POUR BTL (IV SOLUTION) IMPLANT
PACK CYSTO (CUSTOM PROCEDURE TRAY) ×3 IMPLANT
SYR TOOMEY IRRIG 70ML (MISCELLANEOUS)
SYRINGE TOOMEY IRRIG 70ML (MISCELLANEOUS) IMPLANT
TRACTIP FLEXIVA PULS ID 200XHI (Laser) IMPLANT
TRACTIP FLEXIVA PULSE ID 200 (Laser)
TUBING CONNECTING 10 (TUBING) ×3 IMPLANT
TUBING UROLOGY SET (TUBING) ×3 IMPLANT

## 2022-12-30 NOTE — Transfer of Care (Signed)
Immediate Anesthesia Transfer of Care Note  Patient: [name]  Procedure(s) Performed: Procedure(s) with comments: CYSTOSCOPY WITH BLADDER BIOPSY (N/A) - 45 MINUTES CYSTOSCOPY WITH FULGERATION (N/A) CYSTOSCOPY WITH BILATERAL RETROGRADE PYELOGRAM (Bilateral)  Patient Location: PACU  Anesthesia Type:General  Level of Consciousness: Alert, Awake, Oriented  Airway & Oxygen Therapy: Patient Spontanous Breathing  Post-op Assessment: Report given to RN  Post vital signs: Reviewed and stable  Last Vitals:  Vitals:   12/30/22 0911  BP: (!) 143/85  Pulse: 70  Resp: 17  Temp: 36.4 C  SpO2: 99%    Complications: No apparent anesthesia complications

## 2022-12-30 NOTE — Anesthesia Postprocedure Evaluation (Signed)
Anesthesia Post Note  Patient: Jerry Barr  Procedure(s) Performed: CYSTOSCOPY WITH BLADDER BIOPSY (Bladder) CYSTOSCOPY WITH FULGERATION (Bladder) CYSTOSCOPY WITH BILATERAL RETROGRADE PYELOGRAM (Bilateral: Urethra)     Patient location during evaluation: PACU Anesthesia Type: General Level of consciousness: awake and alert and oriented Pain management: pain level controlled Vital Signs Assessment: post-procedure vital signs reviewed and stable Respiratory status: spontaneous breathing, nonlabored ventilation and respiratory function stable Cardiovascular status: blood pressure returned to baseline and stable Postop Assessment: no apparent nausea or vomiting Anesthetic complications: no   No notable events documented.  Last Vitals:  Vitals:   12/30/22 1320 12/30/22 1325  BP:    Pulse: 64 66  Resp: 12 20  Temp:    SpO2: 98% 96%    Last Pain:  Vitals:   12/30/22 1240  TempSrc:   PainSc: 6                  Krystle Polcyn A.

## 2022-12-30 NOTE — Interval H&P Note (Signed)
History and Physical Interval Note:  12/30/2022 11:32 AM  Jerry Barr  has presented today for surgery, with the diagnosis of DYSURIA, BLADDER LESION AT BLADDER NECK.  The various methods of treatment have been discussed with the patient and family. After consideration of risks, benefits and other options for treatment, the patient has consented to  Procedure(s) with comments: CYSTOSCOPY WITH BLADDER BIOPSY (N/A) - 45 MINUTES CYSTOSCOPY WITH FULGERATION (N/A) CYSTOSCOPY WITH BILATERAL RETROGRADE PYELOGRAM (Bilateral) as a surgical intervention.  The patient's history has been reviewed, patient examined, no change in status, stable for surgery.  I have reviewed the patient's chart and labs.  Questions were answered to the patient's satisfaction.     Crist Fat

## 2022-12-30 NOTE — Discharge Instructions (Addendum)
Transurethral Resection of Bladder Tumor (TURBT) or Bladder Biopsy   Definition:  Transurethral Resection of the Bladder Tumor is a surgical procedure used to diagnose and remove tumors within the bladder. TURBT is the most common treatment for early stage bladder cancer.  General instructions:     Your recent bladder surgery requires very little post hospital care but some definite precautions.  Despite the fact that no skin incisions were used, the area around the bladder incisions are raw and covered with scabs to promote healing and prevent bleeding. Certain precautions are needed to insure that the scabs are not disturbed over the next 2-4 weeks while the healing proceeds.  Because the raw surface inside your bladder and the irritating effects of urine you may expect frequency of urination and/or urgency (a stronger desire to urinate) and perhaps even getting up at night more often. This will usually resolve or improve slowly over the healing period. You may see some blood in your urine over the first 6 weeks. Do not be alarmed, even if the urine was clear for a while. Get off your feet and drink lots of fluids until clearing occurs. If you start to pass clots or don't improve call us.  Diet:  You may return to your normal diet immediately. Because of the raw surface of your bladder, alcohol, spicy foods, foods high in acid and drinks with caffeine may cause irritation or frequency and should be used in moderation. To keep your urine flowing freely and avoid constipation, drink plenty of fluids during the day (8-10 glasses). Tip: Avoid cranberry juice because it is very acidic.  Activity:  Your physical activity doesn't need to be restricted. However, if you are very active, you may see some blood in the urine. We suggest that you reduce your activity under the circumstances until the bleeding has stopped.  Bowels:  It is important to keep your bowels regular during the postoperative  period. Straining with bowel movements can cause bleeding. A bowel movement every other day is reasonable. Use a mild laxative if needed, such as milk of magnesia 2-3 tablespoons, or 2 Dulcolax tablets. Call if you continue to have problems. If you had been taking narcotics for pain, before, during or after your surgery, you may be constipated. Take a laxative if necessary.    Medication:  You should resume your pre-surgery medications unless told not to. In addition you may be given an antibiotic to prevent or treat infection. Antibiotics are not always necessary. All medication should be taken as prescribed until the bottles are finished unless you are having an unusual reaction to one of the drugs.    Resume Xarelto when the bleeding has stopped for 48 hours.

## 2022-12-30 NOTE — Op Note (Signed)
Preoperative diagnosis:  Dysuria  Postoperative diagnosis:  Same  Procedure: Cystoscopy, bilateral retrograde pyelogram with interpretation Bladder biopsy, bladder neck with fulguration  Surgeon: Crist Fat, MD  Anesthesia: General  Complications: None  Intraoperative findings:  #1: The patient's left retrograde pyelogram demonstrated normal distal ureter with a bifurcation in the mid ureter and a duplicated system with no significant filling defects or abnormalities.  The upper pole moiety was blunted but there is no filling defect.  The lower pole moiety was more normal in appearance. #2: The patient's right retrograde pyelogram was performed with 10 cc on neck to contrast and demonstrated no significant filling the defects or abnormalities.  There is no hydroureteronephrosis. #3: Cystoscopy demonstrated a mildly trabeculated bladder with some erythema around the bladder neck at the 3 o'clock position all the way around to the 9 o'clock position posteriorly.  These areas were biopsied.  EBL: Minimal  Specimens: Bladder neck biopsies  Indication: Dax Vold is a 82 y.o. patient with dysuria which was new in onset for the patient.  Evaluation with cystoscopy demonstrated some erythematous areas around the bladder neck which I recommended biopsy given his symptoms..  After reviewing the management options for treatment, he elected to proceed with the above surgical procedure(s). We have discussed the potential benefits and risks of the procedure, side effects of the proposed treatment, the likelihood of the patient achieving the goals of the procedure, and any potential problems that might occur during the procedure or recuperation. Informed consent has been obtained.  Description of procedure:  Consent was obtained in the preoperative holding area.  He was then brought back to the operating room placed on the table in supine position.  The anesthesia was then induced and  endotracheal tube was inserted.  He was placed in dorsolithotomy position and prepped and draped in the sterile fashion.  A timeout was then performed.  21 French 30 degree cystoscope was then gently passed to the patient's region in the bladder under vision guidance.  Cystoscopy was performed the above findings.  Then exchanged the 30 degree lens for the 70 degree lens and repeated the evaluation with no additional findings.  I subsequently performed bilateral retrograde pyelograms with the above findings using a 5 [place] open-ended ureteral catheter and 10 cc of IV contrast bilaterally.  I then used the rigid biopsy forceps and biopsied 3 areas along the bladder neck at the 3 o'clock position 6 o'clock position in the 9 o'clock position.  I then used the Bugbee cautery and copiously fulgurated the area.  I placed a Foley catheter in the patient's bladder and terminated the case.  Disposition: The patient will be discharged home without the Foley catheter.

## 2022-12-30 NOTE — H&P (Signed)
The patient is seen today for obstructive lower urinary tract symptoms. His symptoms include nocturia x4, weak stream, intermittency, hesitancy, and urinary frequency during the day. These symptoms have been present now for the last several years. The patient denies a history of constipation. He denies any hematuria or dysuria. He has no history of recurring infections or prostatitis. The patient had a PSA drawn in 2018 and it was 2.8.   The patient has a history of orthostatic hypotension. He also his history of coronary artery disease and atrial fibrillation. The patient has a REM sleeping disorder as well.   Interval: The patient continues to be a evaluated by both Cardiology and Neurology for his orthostasis. He has not discussed his sleep disorder with neurologist yet, they have not gone over the use of Xyram for his REM disorder. His biggest complaint continues to be nocturia. It times in the night he also has very weak stream and feels if he is not emptying his bladder well. However, during the day symptoms are not nearly as bad.   The patient presents today having obtained a prostate ultrasound prior.   Interval: Today the patient follows up for re-evaluation of his nocturia and voiding symptoms. He has since been evaluated by Cardiology and his amiodarone stopped. Once stopping the amiodarone he noted significant improvement in his stream as well as his nocturia. He is getting of not 2 or 3 times at night. He has a stronger stream and feels as the empty his bladder completely. He did try Myrbetriq and it was unsuccessful or un helpful.   The patient also describes erectile dysfunction which we discussed in the past. He tried sildenafil about 4 years ago, 20 mg, and it was quite successful.   12/07/2022:  Patient presents acutely with concerns for UTI. He was last seen 23 months ago, and recommended for yearly follow-up. He had been prescribed daily tadalafil, and Myrbetriq was discontinued.  Today, he is on no medications by urology.   Over the last 30 to 40 days, patient has been experiencing intermittent yet frequent dysuria, with associated increase in frequency, intermittency, and with decreased force of stream. His dysuria involves the entire length of the urethra, but remains worse at the tip. At times, it is briefly improved after urination. It is worsened upon taking midodrine, or supplements of salt water, both prescribed to patient for his orthostatic hypotension. Per patient report, he presented in late April to urgent care, and was advised that his urine did not show infection, yet placed on 30-day course of cefdinir twice daily, which ended last Saturday. However, his dysuria worsened last Friday, and is now constant. Patient attempted AZO last night, with some symptom improvement. Today, patient continues with persistent dysuria. He denies suprapubic discomfort, gross hematuria, flank pain, fever/chills, nausea/vomiting. Of note, patient was seen in ED on 5/22 for hypokalemia, with UA noted WNL.   Intv: Patient is here today for reevaluation. The above is noted. He has had little to no improvement except for when using Azo. His prostate has been checked several times and it was nontender.     ALLERGIES: Penicillin    MEDICATIONS: Phenazopyridine Hcl 200 mg tablet 1 tablet PO TID PRN  Acetaminophen 500 mg tablet  Atorvastatin Calcium 20 mg tablet  Benefiber  Fluticasone Propionate 50 mcg/actuation spray, suspension  Loratadine 10 mg tablet  Midodrine Hcl 5 mg tablet  Tikosyn  Vitamin D3  Xarelto 20 mg tablet     GU PSH: None  NON-GU PSH: Cardiac Stent Placement     GU PMH: Dysuria - 12/07/2022 Urinary Frequency - 12/07/2022 Weak Urinary Stream (Stable) - 12/07/2022, - 2021 Nocturia - 2021      PMH Notes: orthostatic hypotension   NON-GU PMH: Arrhythmia Atrial Fibrillation Basal cell carcinoma of skin, unspecified GERD Orthostatic hypotension REM sleep  behavior disorder    FAMILY HISTORY: Cancer - Grandfather, Brother, Mother Kidney Cancer - Brother   SOCIAL HISTORY: Marital Status: Married Preferred Language: English; Ethnicity: Not Hispanic Or Latino; Race: [place] Current Smoking Status: Patient has never smoked.   Tobacco Use Assessment Completed: Used Tobacco in last 30 days? Does not use smokeless tobacco. Does drink.  Does not use drugs. Drinks 1 caffeinated drink per day.    REVIEW OF SYSTEMS:    GU Review Male:   Patient denies frequent urination, hard to postpone urination, burning/ pain with urination, get up at night to urinate, leakage of urine, stream starts and stops, trouble starting your stream, have to strain to urinate , erection problems, and penile pain.  Gastrointestinal (Upper):   Patient denies nausea, vomiting, and indigestion/ heartburn.  Gastrointestinal (Lower):   Patient denies diarrhea and constipation.  Constitutional:   Patient denies fever, night sweats, weight loss, and fatigue.  Skin:   Patient denies skin rash/ lesion and itching.  Eyes:   Patient denies blurred vision and double vision.  Ears/ Nose/ Throat:   Patient denies sore throat and sinus problems.  Hematologic/Lymphatic:   Patient denies swollen glands and easy bruising.  Cardiovascular:   Patient denies leg swelling and chest pains.  Respiratory:   Patient denies cough and shortness of breath.  Endocrine:   Patient denies excessive thirst.  Musculoskeletal:   Patient denies back pain and joint pain.  Neurological:   Patient denies headaches and dizziness.  Psychologic:   Patient denies depression and anxiety.   VITAL SIGNS: None   Complexity of Data:  Lab Test Review:   PSA  Records Review:   Previous Doctor Records, Previous Patient Records, POC Tool  Urine Test Review:   Urinalysis   PROCEDURES:         Flexible Cystoscopy - 52000  Risks, benefits, and some of the potential complications of the procedure were discussed at  length with the patient including infection, bleeding, voiding discomfort, urinary retention, fever, chills, sepsis, and others. All questions were answered. Informed consent was obtained. Sterile technique and intraurethral analgesia were used.  Meatus:  Normal size. Normal location. Normal condition.  Urethra:  No strictures.  External Sphincter:  Normal.  Verumontanum:  Normal.  Prostate:  Non-obstructing. No hyperplasia.  Bladder Neck:  Intravesical prostatic intrusion. There was slight abnormality around the bladder neck concerning for CIS.  Ureteral Orifices:  Normal location. Normal size. Normal shape. Effluxed clear urine.  Bladder:  No trabeculation. No tumors. Normal mucosa. No stones.      The lower urinary tract was carefully examined. The procedure was well-tolerated and without complications. Antibiotic instructions were given. Instructions were given to call the office immediately for bloody urine, difficulty urinating, urinary retention, painful or frequent urination, fever, chills, nausea, vomiting or other illness. The patient stated that he understood these instructions and would comply with them.         Urinalysis w/Scope - 81001 Dipstick Dipstick Cont'd Micro  Color: Orange Bilirubin: Invalid WBC/hpf: NS (Not Seen)  Appearance: Cloudy Ketones: Invalid RBC/hpf: 0 - 2/hpf  Specific Gravity: Invalid Blood: Invalid Bacteria: NS (Not Seen)  pH: Invalid  Protein: Invalid Cystals: Amorph Urates  Glucose: Invalid Urobilinogen: Invalid Casts: NS (Not Seen)    Nitrites: Invalid Trichomonas: Not Present    Leukocyte Esterase: Invalid Mucous: Not Present      Epithelial Cells: 0 - 5/hpf      Yeast: NS (Not Seen)      Sperm: Not Present    Notes:  dip invalid due to color interference     ASSESSMENT:      ICD-10 Details  1 GU:   Dysuria - R30.0   2   Weak Urinary Stream - R39.12      PLAN:            Medications New Meds: Uroxatral 10 mg tablet, extended release 24  hr 1 tablet PO Daily   #30  11 Refill(s)  Pharmacy Name:  [facility] [phone]  Address:  [address]   [place], [place] [address]  Phone:  [phone]  Fax:  [phone]            Orders Labs Urine Cytology          Document Letter(s):  Created for Patient: Clinical Summary         Notes:   The patient has dysuria and frequency of no clear etiology. He does have an obstructing prostate, but this is not new. Not sure why he has this new onset dysuria. I did see a slight abnormality at the bladder neck which I think is worth biopsying to rule out CIS. I went through that with him in detail and he was willing to proceed. I also suggested that we try him on alfuzosin to see if we could not alleviate some of the dysuria and voiding symptoms. He does have a history of orthostatic hypotension, but this seems to be fairly well-managed. Alfuzosin does have some degree of hypotension, but much lower than the other alpha blockers. I think it is worth trying. I will start with half tablet for a week and see how he does.   For the patient's bladder biopsy we will need to stop Xarelto, reach out to his cardiologist to ensure this is appropriate.

## 2022-12-30 NOTE — Anesthesia Procedure Notes (Signed)
Procedure Name: LMA Insertion Date/Time: 12/30/2022 11:43 AM  Performed by: Basilio Cairo, CRNAPre-anesthesia Checklist: Patient identified, Patient being monitored, Timeout performed, Emergency Drugs available and Suction available Patient Re-evaluated:Patient Re-evaluated prior to induction Oxygen Delivery Method: Circle system utilized Preoxygenation: Pre-oxygenation with 100% oxygen Induction Type: IV induction Ventilation: Mask ventilation without difficulty LMA: LMA inserted LMA Size: 4.0 Tube type: Oral Number of attempts: 1 Placement Confirmation: positive ETCO2 and breath sounds checked- equal and bilateral Tube secured with: Tape Dental Injury: Teeth and Oropharynx as per pre-operative assessment

## 2022-12-30 NOTE — H&P (Signed)
The patient is seen today for obstructive lower urinary tract symptoms. His symptoms include nocturia x4, weak stream, intermittency, hesitancy, and urinary frequency during the day. These symptoms have been present now for the last several years. The patient denies a history of constipation. He denies any hematuria or dysuria. He has no history of recurring infections or prostatitis. The patient had a PSA drawn in 2018 and it was 2.8.   The patient has a history of orthostatic hypotension. He also his history of coronary artery disease and atrial fibrillation. The patient has a REM sleeping disorder as well.   The patient continues to be a evaluated by both Cardiology and Neurology for his orthostasis. He has not discussed his sleep disorder with neurologist yet, they have not gone over the use of Xyram for his REM disorder. His biggest complaint continues to be nocturia. It times in the night he also has very weak stream and feels if he is not emptying his bladder well. However, during the day symptoms are not nearly as bad.   The patient presents today having obtained a prostate ultrasound prior.   He did try Myrbetriq and it was unsuccessful or un helpful.   The patient also describes erectile dysfunction which we discussed in the past. He tried sildenafil about 4 years ago, 20 mg, and it was quite successful.   12/07/2022:  Patient presents acutely with concerns for UTI. He was last seen 23 months ago, and recommended for yearly follow-up. He had been prescribed daily tadalafil, and Myrbetriq was discontinued. Today, he is on no medications by urology.   Over the last 30 to 40 days, patient has been experiencing intermittent yet frequent dysuria, with associated increase in frequency, intermittency, and with decreased force of stream. His dysuria involves the entire length of the urethra, but remains worse at the tip. At times, it is briefly improved after urination. It is worsened upon taking  midodrine, or supplements of salt water, both prescribed to patient for his orthostatic hypotension. Per patient report, he presented in late April to urgent care, and was advised that his urine did not show infection, yet placed on 30-day course of cefdinir twice daily, which ended last Saturday. However, his dysuria worsened last Friday, and is now constant. Patient attempted AZO last night, with some symptom improvement. Today, patient continues with persistent dysuria. He denies suprapubic discomfort, gross hematuria, flank pain, fever/chills, nausea/vomiting. Of note, patient was seen in ED on 5/22 for hypokalemia, with UA noted WNL.      ALLERGIES: Penicillin    MEDICATIONS: Phenazopyridine Hcl 200 mg tablet 1 tablet PO TID PRN  Acetaminophen 500 mg tablet  Atorvastatin Calcium 20 mg tablet  Benefiber  Fluticasone Propionate 50 mcg/actuation spray, suspension  Loratadine 10 mg tablet  Midodrine Hcl 5 mg tablet  Tikosyn  Vitamin D3  Xarelto 20 mg tablet     GU PSH: None   NON-GU PSH: Cardiac Stent Placement     GU PMH: Dysuria - 12/07/2022 Urinary Frequency - 12/07/2022 Weak Urinary Stream (Stable) - 12/07/2022, - 2021 Nocturia - 2021      PMH Notes: orthostatic hypotension   NON-GU PMH: Arrhythmia Atrial Fibrillation Basal cell carcinoma of skin, unspecified GERD Orthostatic hypotension REM sleep behavior disorder    FAMILY HISTORY: Cancer - Grandfather, Brother, Mother Kidney Cancer - Brother   SOCIAL HISTORY: Marital Status: Married Preferred Language: English; Ethnicity: Not Hispanic Or Latino; Race: [place] Current Smoking Status: Patient has never smoked.   Tobacco Use  Assessment Completed: Used Tobacco in last 30 days? Does not use smokeless tobacco. Does drink.  Does not use drugs. Drinks 1 caffeinated drink per day.    REVIEW OF SYSTEMS:    GU Review Male:   Patient denies frequent urination, hard to postpone urination, burning/ pain with urination,  get up at night to urinate, leakage of urine, stream starts and stops, trouble starting your stream, have to strain to urinate , erection problems, and penile pain.  Gastrointestinal (Upper):   Patient denies nausea, vomiting, and indigestion/ heartburn.  Gastrointestinal (Lower):   Patient denies diarrhea and constipation.  Constitutional:   Patient denies fever, night sweats, weight loss, and fatigue.  Skin:   Patient denies skin rash/ lesion and itching.  Eyes:   Patient denies blurred vision and double vision.  Ears/ Nose/ Throat:   Patient denies sore throat and sinus problems.  Hematologic/Lymphatic:   Patient denies swollen glands and easy bruising.  Cardiovascular:   Patient denies leg swelling and chest pains.  Respiratory:   Patient denies cough and shortness of breath.  Endocrine:   Patient denies excessive thirst.  Musculoskeletal:   Patient denies back pain and joint pain.  Neurological:   Patient denies headaches and dizziness.  Psychologic:   Patient denies depression and anxiety.   VITAL SIGNS: None   Complexity of Data:  Lab Test Review:   PSA  Records Review:   Previous Doctor Records, Previous Patient Records, POC Tool  Urine Test Review:   Urinalysis   PROCEDURES:         Flexible Cystoscopy - 52000  Risks, benefits, and some of the potential complications of the procedure were discussed at length with the patient including infection, bleeding, voiding discomfort, urinary retention, fever, chills, sepsis, and others. All questions were answered. Informed consent was obtained. Sterile technique and intraurethral analgesia were used.  Meatus:  Normal size. Normal location. Normal condition.  Urethra:  No strictures.  External Sphincter:  Normal.  Verumontanum:  Normal.  Prostate:  Non-obstructing. No hyperplasia.  Bladder Neck:  Intravesical prostatic intrusion. There was slight abnormality around the bladder neck concerning for CIS.  Ureteral Orifices:  Normal  location. Normal size. Normal shape. Effluxed clear urine.  Bladder:  No trabeculation. No tumors. Normal mucosa. No stones.      The lower urinary tract was carefully examined. The procedure was well-tolerated and without complications. Antibiotic instructions were given. Instructions were given to call the office immediately for bloody urine, difficulty urinating, urinary retention, painful or frequent urination, fever, chills, nausea, vomiting or other illness. The patient stated that he understood these instructions and would comply with them.         Urinalysis w/Scope - 81001 Dipstick Dipstick Cont'd Micro  Color: Orange Bilirubin: Invalid WBC/hpf: NS (Not Seen)  Appearance: Cloudy Ketones: Invalid RBC/hpf: 0 - 2/hpf  Specific Gravity: Invalid Blood: Invalid Bacteria: NS (Not Seen)  pH: Invalid Protein: Invalid Cystals: Amorph Urates  Glucose: Invalid Urobilinogen: Invalid Casts: NS (Not Seen)    Nitrites: Invalid Trichomonas: Not Present    Leukocyte Esterase: Invalid Mucous: Not Present      Epithelial Cells: 0 - 5/hpf      Yeast: NS (Not Seen)      Sperm: Not Present    Notes:  dip invalid due to color interference     ASSESSMENT:      ICD-10 Details  1 GU:   Dysuria - R30.0   2   Weak Urinary Stream - R39.12  PLAN:            Medications New Meds: Uroxatral 10 mg tablet, extended release 24 hr 1 tablet PO Daily   #30  11 Refill(s)  Pharmacy Name:  [facility] [phone]  Address:  [address]   [place], [place] [address]  Phone:  [phone]  Fax:  [phone]            Orders Labs Urine Cytology          Document Letter(s):  Created for Patient: Clinical Summary         Notes:   The patient has dysuria and frequency of no clear etiology. He does have an obstructing prostate, but this is not new. Not sure why he has this new onset dysuria. I did see a slight abnormality at the bladder neck which I think is worth biopsying to rule out CIS. I  went through that with him in detail and he was willing to proceed. I also suggested that we try him on alfuzosin to see if we could not alleviate some of the dysuria and voiding symptoms. He does have a history of orthostatic hypotension, but this seems to be fairly well-managed. Alfuzosin does have some degree of hypotension, but much lower than the other alpha blockers. I think it is worth trying. I will start with half tablet for a week and see how he does.   For the patient's bladder biopsy we will need to stop Xarelto, reach out to his cardiologist to ensure this is appropriate.

## 2022-12-31 ENCOUNTER — Encounter (HOSPITAL_COMMUNITY): Payer: Self-pay | Admitting: Urology

## 2022-12-31 LAB — SURGICAL PATHOLOGY

## 2023-01-11 ENCOUNTER — Other Ambulatory Visit: Payer: Self-pay | Admitting: Cardiology

## 2023-01-11 NOTE — Telephone Encounter (Signed)
Prescription refill request for Xarelto received.  Indication: Afib  Last office visit: 11/30/22 ([place])  Weight: 72.3kg Age: 82 Scr: 0.98 (12/01/22)  CrCl: 60.23ml/min  Appropriate dose. Refill sent.

## 2023-01-14 ENCOUNTER — Telehealth: Payer: Self-pay | Admitting: Internal Medicine

## 2023-01-14 DIAGNOSIS — R300 Dysuria: Secondary | ICD-10-CM | POA: Diagnosis not present

## 2023-01-14 DIAGNOSIS — R3912 Poor urinary stream: Secondary | ICD-10-CM | POA: Diagnosis not present

## 2023-01-14 NOTE — Telephone Encounter (Signed)
Patient states that he saw Dr. Graciela Husbands at church recently and was told by Dr. Graciela Husbands to schedule with him as soon as possible due to issues patient is having. Requesting call back.

## 2023-01-14 NOTE — Telephone Encounter (Signed)
Spoke with Jerry Barr who reports he is having urinary/prostate issues.  He has discussed different strategies with urology but needs to speak with Dr Graciela Husbands regarding pharmacologic possibilities due to his history of orthostatic hypotension as these medications lower BP.   Jerry Barr would like to schedule a telephone visit with Dr Graciela Husbands prior to his next urology appointment on September 3rd. Jerry Barr advised will forward to Dr Odessa Fleming scheduler for further assistance.  Jerry Barr verbalizes understanding and thanked Charity fundraiser for the call.

## 2023-01-26 ENCOUNTER — Ambulatory Visit: Payer: Medicare Other | Attending: Internal Medicine | Admitting: Internal Medicine

## 2023-01-26 ENCOUNTER — Encounter: Payer: Self-pay | Admitting: Internal Medicine

## 2023-01-26 VITALS — BP 74/46 | HR 77 | Ht 72.0 in | Wt 161.6 lb

## 2023-01-26 DIAGNOSIS — I951 Orthostatic hypotension: Secondary | ICD-10-CM | POA: Diagnosis present

## 2023-01-26 DIAGNOSIS — R002 Palpitations: Secondary | ICD-10-CM | POA: Diagnosis not present

## 2023-01-26 DIAGNOSIS — I4891 Unspecified atrial fibrillation: Secondary | ICD-10-CM | POA: Diagnosis not present

## 2023-01-26 DIAGNOSIS — Z79899 Other long term (current) drug therapy: Secondary | ICD-10-CM | POA: Diagnosis not present

## 2023-01-26 MED ORDER — ATORVASTATIN CALCIUM 20 MG PO TABS: 10.0000 mg | ORAL_TABLET | Freq: Every day | ORAL | Status: DC

## 2023-01-26 MED ORDER — FLUDROCORTISONE ACETATE 0.1 MG PO TABS: 0.1000 mg | ORAL_TABLET | Freq: Every day | ORAL | 3 refills | Status: DC

## 2023-01-26 MED ORDER — ATORVASTATIN CALCIUM 10 MG PO TABS: 10.0000 mg | ORAL_TABLET | Freq: Every day | ORAL | 3 refills | Status: DC

## 2023-01-26 NOTE — Patient Instructions (Signed)
Medication Instructions:  Your physician has recommended you make the following change in your medication:   ** Begin Florinef 0.1mg  - 1 tablet by mouth daily  ** Stop Atorvastatin 20mg   ** Begin Atorvastatin 10mg  - 1 tablet by mouth daily.  *If you need a refill on your cardiac medications before your next appointment, please call your pharmacy*   Lab Work: BMET in 2 weeks  If you have labs (blood work) drawn today and your tests are completely normal, you will receive your results only by: MyChart Message (if you have MyChart) OR A paper copy in the mail If you have any lab test that is abnormal or we need to change your treatment, we will call you to review the results.   Testing/Procedures: None ordered.    Follow-Up: At [facility], you and your health needs are our priority.  As part of our continuing mission to provide you with exceptional heart care, we have created designated Provider Care Teams.  These Care Teams include your primary Cardiologist (physician) and Advanced Practice Providers (APPs -  Physician Assistants and Nurse Practitioners) who all work together to provide you with the care you need, when you need it.  We recommend signing up for the patient portal called "MyChart".  Sign up information is provided on this After Visit Summary.  MyChart is used to connect with patients for Virtual Visits (Telemedicine).  Patients are able to view lab/test results, encounter notes, upcoming appointments, etc.  Non-urgent messages can be sent to your provider as well.   To learn more about what you can do with MyChart, go to [email or web address].    Your next appointment:   6 months with Dr Graciela Husbands  Use Compression as discussed by Dr Graciela Husbands

## 2023-01-26 NOTE — Progress Notes (Signed)
Patient Care Team: Tally Joe, MD as PCP - General (Family Medicine) [place], Peter M, MD as PCP - Cardiology (Cardiology) Duke Salvia, MD as PCP - Electrophysiology (Cardiology) Janalyn Harder, MD (Inactive) as Consulting Physician (Dermatology)   HPI  Jerry Barr is a 82 y.o. male seen in follow-up for atrial fibrillation and orthostatic hypotension with little heart rate response concerning for more systemic autonomic insufficiency.  Improved with discontinuation of amiodarone, but more atrial fibrillation   Underwent AFib ablation 5/22 (JA)  no interval atrial fib of which he is aware;   continues on dofetilide and rivaroxaban History of remote coronary artery disease with stenting LAD 2011 circumflex 2020      The patient denies chest pain, shortness of breath, nocturnal dyspnea, orthopnea or peripheral edema.  There have been no palpitations or syncope.  Complains of lightheadedness which is largely quiescent but profound fatigue.  This correlates with his blood pressure.  Is also been having issues of urinary retention and comes today to discuss pharmacological therapy is intended in relieving that.Marland Kitchen     DATE TEST EF    10/17 Echo   50-55% %    12/18 Echo  45-50%    12/20 LHC   LAD Stents patent  Cx-stent patent; RCA patent    Date Cr K Mg Hgb  5/21 1.26 4.6   13.9   4/22  0.96 4.9  2.5  14.5  6/23 0.98 4.7 2.3 14.5  5/24 0.98 4.1<<5.8(lab error) 2.3 13.9     Thromboembolic risk factors ( age -72, HTN-1, Vasc disease -1) for a CHADSVASc Score of >=4     Records and Results Reviewed   Past Medical History:  Diagnosis Date   Allergy    Aortic atherosclerosis (HCC)    Atrial fibrillation (HCC)    BPH (benign prostatic hyperplasia)    CAD (coronary artery disease)    Cancer (HCC)    basal cell ca - face, nose and right leg   Coronary artery disease    Promus drug-eluting stent to a 99% mid LAD, 40-50% narrowing in the distal LAD with mild to moderate  disease in the circ and RCA b. 5/6 PCI/DESx1 to mLcx   Dyslipidemia    ED (erectile dysfunction)    Hearing loss    Hyperplastic colon polyp    Hypoglycemia    Orthostatic hypotension    REM sleep behavior disorder    not tested    Past Surgical History:  Procedure Laterality Date   ATRIAL FIBRILLATION ABLATION N/A 11/18/2020   Procedure: ATRIAL FIBRILLATION ABLATION;  Surgeon: Hillis Range, MD;  [facility] LAB;  Service: Cardiovascular;  Laterality: N/A;   BUBBLE STUDY  08/27/2020   Procedure: BUBBLE STUDY;  Surgeon: Meriam Sprague, MD;  Location: [facility] ENDOSCOPY;  Service: Cardiovascular;;   CARDIOVERSION N/A 04/23/2016   Procedure: CARDIOVERSION;  Surgeon: Jake Bathe, MD;  Location: [facility] ENDOSCOPY;  Service: Cardiovascular;  Laterality: N/A;   CARDIOVERSION N/A 08/21/2020   Procedure: CARDIOVERSION;  Surgeon: Lars Masson, MD;  Location: [facility] ENDOSCOPY;  Service: Cardiovascular;  Laterality: N/A;   CARDIOVERSION N/A 08/29/2020   Procedure: CARDIOVERSION;  Surgeon: Thurmon Fair, MD;  Location: [facility] ENDOSCOPY;  Service: Cardiovascular;  Laterality: N/A;   CARDIOVERSION N/A 09/24/2020   Procedure: CARDIOVERSION;  Surgeon: Meriam Sprague, MD;  Location: [facility] ENDOSCOPY;  Service: Cardiovascular;  Laterality: N/A;   COLONOSCOPY     CORONARY ANGIOPLASTY WITH STENT PLACEMENT  CORONARY STENT INTERVENTION N/A 11/15/2018   Procedure: CORONARY STENT INTERVENTION;  Surgeon: Kathleene Hazel, MD;  [facility]: [facility];  Service: Cardiovascular;  Laterality: N/A;   CYSTOSCOPY W/ RETROGRADES Bilateral 12/30/2022   Procedure: CYSTOSCOPY WITH BILATERAL RETROGRADE PYELOGRAM;  Surgeon: Crist Fat, MD;  Location: [facility] ORS;  Service: Urology;  Laterality: Bilateral;   CYSTOSCOPY WITH BIOPSY N/A 12/30/2022   Procedure: CYSTOSCOPY WITH BLADDER BIOPSY;  Surgeon: Crist Fat, MD;  Location: [facility] ORS;  Service: Urology;  Laterality: N/A;  45 MINUTES    CYSTOSCOPY WITH FULGERATION N/A 12/30/2022   Procedure: CYSTOSCOPY WITH FULGERATION;  Surgeon: Crist Fat, MD;  Location: [facility] ORS;  Service: Urology;  Laterality: N/A;   LEFT HEART CATH AND CORONARY ANGIOGRAPHY N/A 11/15/2018   Procedure: LEFT HEART CATH AND CORONARY ANGIOGRAPHY;  Surgeon: Kathleene Hazel, MD;  [facility];  Service: Cardiovascular;  Laterality: N/A;   LEFT HEART CATH AND CORONARY ANGIOGRAPHY N/A 06/25/2019   Procedure: LEFT HEART CATH AND CORONARY ANGIOGRAPHY;  Surgeon: Swaziland, Peter M, MD;  Location: [facility]VAS[facility] CV LAB;  Service: Cardiovascular;  Laterality: N/A;   POLYPECTOMY     TEE WITHOUT CARDIOVERSION N/A 04/23/2016   Procedure: TRANSESOPHAGEAL ECHOCARDIOGRAM (TEE);  Surgeon: Jake Bathe, MD;  Location: [facility] ENDOSCOPY;  Service: Cardiovascular;  Laterality: N/A;   TEE WITHOUT CARDIOVERSION N/A 08/27/2020   Procedure: TRANSESOPHAGEAL ECHOCARDIOGRAM (TEE);  Surgeon: Meriam Sprague, MD;  Location: [facility] ENDOSCOPY;  Service: Cardiovascular;  Laterality: N/A;   UPPER GASTROINTESTINAL ENDOSCOPY      Current Meds  Medication Sig   acetaminophen (TYLENOL) 500 MG tablet Take 500 mg by mouth every 6 (six) hours as needed for mild pain (or headaches).   atorvastatin (LIPITOR) 20 MG tablet TAKE 1 TABLET BY MOUTH EVERY DAY   Cholecalciferol (VITAMIN D-3) 1000 UNITS CAPS Take 1,000 Units by mouth daily.   dofetilide (TIKOSYN) 500 MCG capsule TAKE ONE CAPSULE BY MOUTH TWICE A DAY   loratadine (CLARITIN) 10 MG tablet Take 10 mg by mouth daily.   Melatonin 3 MG TABS Take 6 mg by mouth at bedtime.   midodrine (PROAMATINE) 5 MG tablet Take 5 mg by mouth daily as needed (hypotension).   olopatadine (PATANOL) 0.1 % ophthalmic solution Place 1 drop into both eyes daily as needed (allergies).   phenazopyridine (PYRIDIUM) 200 MG tablet Take 200 mg by mouth 3 (three) times daily with meals.   polyethylene glycol (MIRALAX / GLYCOLAX) 17 g packet Take 17 g by  mouth daily as needed for mild constipation.   rivaroxaban (XARELTO) 20 MG TABS tablet Take 1 tablet (20 mg total) by mouth daily.    Allergies  Allergen Reactions   Amiodarone Other (See Comments)   Metoprolol     Other reaction(s): fatigue   Penicillins Rash    Did it involve swelling of the face/tongue/throat, SOB, or low BP? No Did it involve sudden or severe rash/hives, skin peeling, or any reaction on the inside of your mouth or nose? No Did you need to seek medical attention at a hospital or doctor's office? No When did it last happen?      50+ years  If all above answers are "NO", may proceed with cephalosporin use.       Review of Systems negative except from HPI and PMH  Physical Exam BP (!) 74/46 (Patient Position: Standing)   Pulse 77   Ht 6' (1.829 m)   Wt 161 lb 9.6 oz (  73.3 kg)   SpO2 92%   BMI 21.92 kg/m  Well developed and nourished in no acute distress HENT normal Neck supple with JVP-  flat   Clear Regular rate and rhythm, no murmurs or gallops Abd-soft with active BS No Clubbing cyanosis edema Skin-warm and dry A & Oriented  Grossly normal sensory and motor function  ECG sinus at 70 Normal 20/09/46  Assessment and  Plan Orthostatic intolerance with modest change in heart rate with following blood pressure  Coronary artery disease with prior stenting   Atrial fibrillation  High Risk Medication Surveillance dofetilide   Palpitations  Orthostatic hypotension remains significant.  Manifesting not so much as lightheadedness but has limiting fatigue.  We asked use of fludrocortisone, in the absence of systemic supine systolic hypertension we will begin at 0.1 mg.  We will check his metabolic profile in a couple of weeks.  We also discussed again the value of compression of the abdomen and or thighs much more so than the cast.  He is having some benefit with this.  No interval atrial fibrillation of which she is aware.  Will continue Xarelto and  dofetilide.  No symptoms of ischemia.  Statins have been associate with orthostasis, his last LDL was at goal we will decrease his atorvastatin from 20--10

## 2023-02-04 ENCOUNTER — Other Ambulatory Visit: Payer: Self-pay | Admitting: Cardiology

## 2023-02-08 ENCOUNTER — Ambulatory Visit: Payer: Medicare Other | Attending: Internal Medicine

## 2023-02-08 DIAGNOSIS — Z79899 Other long term (current) drug therapy: Secondary | ICD-10-CM

## 2023-02-08 DIAGNOSIS — I951 Orthostatic hypotension: Secondary | ICD-10-CM | POA: Diagnosis not present

## 2023-02-08 DIAGNOSIS — R002 Palpitations: Secondary | ICD-10-CM

## 2023-02-08 DIAGNOSIS — I4891 Unspecified atrial fibrillation: Secondary | ICD-10-CM

## 2023-02-08 LAB — BASIC METABOLIC PANEL
BUN/Creatinine Ratio: 18 (ref 10–24)
BUN: 19 mg/dL (ref 8–27)
CO2: 24 mmol/L (ref 20–29)
Calcium: 8.9 mg/dL (ref 8.6–10.2)
Chloride: 107 mmol/L — ABNORMAL HIGH (ref 96–106)
Creatinine, Ser: 1.04 mg/dL (ref 0.76–1.27)
Glucose: 68 mg/dL — ABNORMAL LOW (ref 70–99)
Potassium: 4.5 mmol/L (ref 3.5–5.2)
Sodium: 143 mmol/L (ref 134–144)
eGFR: 72 mL/min/{1.73_m2} (ref >59)

## 2023-02-16 ENCOUNTER — Telehealth: Payer: Self-pay | Admitting: Internal Medicine

## 2023-02-16 NOTE — Telephone Encounter (Signed)
Spoke with pt who states he was prescribed Florinef 0.1mg  - 1 tablet by mouth daily at his last visit and has been unable to tolerate medications due to LEE.  Pt states he tried reduced dose as recommended by Dr Graciela Husbands but continued to have swelling.  Swelling resolves when he is not taking the medication.  Pt states he feels he cannot tolerate Florinef.  Pt will not continue this medication. Pt advised will forward to Dr Graciela Husbands to make him aware and to provide any further recommendation.  Pt will continue efforts at maintaining adequate BP and will plan to keep phone f/u with Dr Graciela Husbands on 08/13.

## 2023-02-16 NOTE — Telephone Encounter (Signed)
Pt c/o medication issue:  1. Name of Medication:   fludrocortisone (FLORINEF) 0.1 MG tablet    2. How are you currently taking this medication (dosage and times per day)?  Take 1 tablet (0.1 mg total) by mouth daily.       3. Are you having a reaction (difficulty breathing--STAT)? No  4. What is your medication issue? Pt is requesting a callback from nurse regarding this medication working but the side effects are worst than the medication he was taking before this one. Please advise

## 2023-02-22 ENCOUNTER — Ambulatory Visit: Payer: Medicare Other | Attending: Internal Medicine | Admitting: Internal Medicine

## 2023-02-22 ENCOUNTER — Encounter: Payer: Self-pay | Admitting: Internal Medicine

## 2023-02-22 DIAGNOSIS — I951 Orthostatic hypotension: Secondary | ICD-10-CM | POA: Diagnosis not present

## 2023-02-22 NOTE — Progress Notes (Signed)
Electrophysiology TeleHealth Note      Date:  02/22/2023   ID:  Jerry Barr, DOB September 11, 1940, MRN [id number]  Location: patient's home  Provider location: [address], [place]  Evaluation Performed: Follow-up visit  PCP:  Tally Joe, MD  Cardiologist:     Electrophysiologist:  SK   Chief Complaint:  orhtostasis   History of Present Illness:    Jerry Barr is a 82 y.o. male who presents via audio conferencing for a telehealth visit today.  Since last being seen in our clinic for orthostatic hypotension with little heart rate response suggestive of more systemic autonomic insufficiency in the setting of atrial fibrillation with prior AF ablation 5/22 and remote coronary disease Fludrocortisone was given with initial good response and then significant volume retention prompting its discontinuation  and resume lower dose and again with fludi retention and now stopped the patient reports being better--using midodrine prn- does cause itching tolerates ok.   Uses AM H2O; sleeping in a recliner.  Some PM hypertension.        The patient denies symptoms of fevers, chills, cough, or new SOB worrisome for COVID 19.   Past Medical History:  Diagnosis Date   Allergy    Aortic atherosclerosis (HCC)    Atrial fibrillation (HCC)    BPH (benign prostatic hyperplasia)    CAD (coronary artery disease)    Cancer (HCC)    basal cell ca - face, nose and right leg   Coronary artery disease    Promus drug-eluting stent to a 99% mid LAD, 40-50% narrowing in the distal LAD with mild to moderate disease in the circ and RCA b. 5/6 PCI/DESx1 to mLcx   Dyslipidemia    ED (erectile dysfunction)    Hearing loss    Hyperplastic colon polyp    Hypoglycemia    Orthostatic hypotension    REM sleep behavior disorder    not tested    Past Surgical History:  Procedure Laterality Date   ATRIAL FIBRILLATION ABLATION N/A 11/18/2020   Procedure: ATRIAL FIBRILLATION ABLATION;   Surgeon: Hillis Range, MD;  [facility]: [facility]VASIVE CV LAB;  Service: Cardiovascular;  Laterality: N/A;   BUBBLE STUDY  08/27/2020   Procedure: BUBBLE STUDY;  Surgeon: Meriam Sprague, MD;  Location: [facility] ENDOSCOPY;  Service: Cardiovascular;;   CARDIOVERSION N/A 04/23/2016   Procedure: CARDIOVERSION;  Surgeon: Jake Bathe, MD;  Location: [facility] ENDOSCOPY;  Service: Cardiovascular;  Laterality: N/A;   CARDIOVERSION N/A 08/21/2020   Procedure: CARDIOVERSION;  Surgeon: Lars Masson, MD;  Location: [facility] ENDOSCOPY;  Service: Cardiovascular;  Laterality: N/A;   CARDIOVERSION N/A 08/29/2020   Procedure: CARDIOVERSION;  Surgeon: Thurmon Fair, MD;  Location: [facility] ENDOSCOPY;  Service: Cardiovascular;  Laterality: N/A;   CARDIOVERSION N/A 09/24/2020   Procedure: CARDIOVERSION;  Surgeon: Meriam Sprague, MD;  Location: [facility] ENDOSCOPY;  Service: Cardiovascular;  Laterality: N/A;   COLONOSCOPY     CORONARY ANGIOPLASTY WITH STENT PLACEMENT     CORONARY STENT INTERVENTION N/A 11/15/2018   Procedure: CORONARY STENT INTERVENTION;  Surgeon: Kathleene Hazel, MD;  Location: [facility] LAB;  Service: Cardiovascular;  Laterality: N/A;   CYSTOSCOPY W/ RETROGRADES Bilateral 12/30/2022   Procedure: CYSTOSCOPY WITH BILATERAL RETROGRADE PYELOGRAM;  Surgeon: Crist Fat, MD;  Location: [facility] ORS;  Service: Urology;  Laterality: Bilateral;   CYSTOSCOPY WITH BIOPSY N/A 12/30/2022   Procedure: CYSTOSCOPY WITH BLADDER BIOPSY;  Surgeon: Crist Fat, MD;  Location: [facility] ORS;  Service: Urology;  Laterality: N/A;  45 MINUTES   CYSTOSCOPY WITH FULGERATION N/A 12/30/2022   Procedure: CYSTOSCOPY WITH FULGERATION;  Surgeon: Crist Fat, MD;  Location: [facility] OR[facility];  Service: Urology;  Laterality: N/A;   LEFT HEART CATH AND CORONARY ANGIOGRAPHY N/A 11/15/2018   Procedure: LEFT HEART CATH AND CORONARY ANGIOGRAPHY;  Surgeon: Kathleene Hazel, MD;  [facility] LAB;  Service: Cardiovascular;   Laterality: N/A;   LEFT HEART CATH AND CORONARY ANGIOGRAPHY N/A 06/25/2019   Procedure: LEFT HEART CATH AND CORONARY ANGIOGRAPHY;  Surgeon: Swazil[place], Peter M, MD;  Location: [facility] INVASIVE CV LAB;  Service: Cardiovascular;  Laterality: N/A;   POLYPECTOMY     TEE WITHOUT CARDIOVERSION N/A 04/23/2016   Procedure: TRANSESOPHAGEAL ECHOCARDIOGRAM (TEE);  Surgeon: Jake Bathe, MD;  Location: [facility] ENDOSCOPY;  Service: Cardiovascular;  Laterality: N/A;   TEE WITHOUT CARDIOVERSION N/A 08/27/2020   Procedure: TRANSESOPHAGEAL ECHOCARDIOGRAM (TEE);  Surgeon: Meriam Sprague, MD;  Location: [facility] ENDOSCOPY;  Service: Cardiovascular;  Laterality: N/A;   UPPER GASTROINTESTINAL ENDOSCOPY      Current Outpatient Medications  Medication Sig Dispense Refill   acetaminophen (TYLENOL) 500 MG tablet Take 500 mg by mouth every 6 (six) hours as needed for mild pain (or headaches).     atorvastatin (LIPITOR) 10 MG tablet Take 1 tablet (10 mg total) by mouth daily. 90 tablet 3   Cholecalciferol (VITAMIN D-3) 1000 UNITS CAPS Take 1,000 Units by mouth daily.     dofetilide (TIKOSYN) 500 MCG capsule TAKE 1 CAPSULE BY MOUTH TWICE A DAY 180 capsule 3   loratadine (CLARITIN) 10 MG tablet Take 10 mg by mouth daily.     Melatonin 3 MG TABS Take 6 mg by mouth at bedtime.     midodrine (PROAMATINE) 5 MG tablet Take 5 mg by mouth daily as needed (hypotension).     olopatadine (PATANOL) 0.1 % ophthalmic solution Place 1 drop into both eyes daily as needed (allergies).     phenazopyridine (PYRIDIUM) 200 MG tablet Take 200 mg by mouth 2 (two) times daily with a meal.     polyethylene glycol (MIRALAX / GLYCOLAX) 17 g packet Take 17 g by mouth daily as needed for mild constipation.     rivaroxaban (XARELTO) 20 MG TABS tablet Take 1 tablet (20 mg total) by mouth daily. 90 tablet 1   No current facility-administered medications for this visit.    Allergies:   Fludrocortisone, Amiodarone, Metoprolol, and Penicillins   ROS:  Please see  the history of present illness.   All other systems are personally reviewed and negative.    Exam:    Vital Signs:  BP (!) 88/54 (Patient Position: Standing)   Pulse 73   Ht 6' (1.829 m)   Wt 160 lb (72.6 kg)   BMI 21.70 kg/m        Labs/Other Tests and Data Reviewed:    Recent Labs: 11/30/2022: Magnesium 2.3 12/01/2022: Hemoglobin 13.9; Platelets 211 02/08/2023: BUN 19; Creatinine, Ser 1.04; Potassium 4.5; Sodium 143   Wt Readings from Last 3 Encounters:  02/22/23 160 lb (72.6 kg)  01/26/23 161 lb 9.6 oz (73.3 kg)  12/30/22 159 lb 8 oz (72.3 kg)     Other studies personally reviewed: Additional studies/ records that were reviewed today include:     ASSESSMENT & PLAN:    Orthostatic intolerance with modest change in heart rate with following blood pressure   Coronary artery disease with prior stenting   Atrial fibrillation   High Risk Medication  Surveillance dofetilide   Palpitations Continue fluid and salt repletion and prn midodrine, and abandoning fludrocortisone.  We wll hold as droxidopa in abeyance    Sleeps in a chair and doing better than a bed Follow-up:  4 m ( recall already in)    Current medicines are reviewed at length with the patient today.   The patient has  concerns regarding his medicines.  The following changes were made today: Fludrocortisone discontinued  Labs/ tests ordered today include: None No orders of the defined types were placed in this encounter.     Today, I have spent 21 minutes with the patient with telehealth technology discussing the above.  Signed, Sherryl Manges, MD  02/22/2023 9:18 AM     [facility] [address] [place] [address] [phone] (office) [phone] (fax)

## 2023-02-22 NOTE — Patient Instructions (Signed)
Medication Instructions:  The current medical regimen is effective;  continue present plan and medications.  *If you need a refill on your cardiac medications before your next appointment, please call your pharmacy*    Follow-Up: At [facility], you and your health needs are our priority.  As part of our continuing mission to provide you with exceptional heart care, we have created designated Provider Care Teams.  These Care Teams include your primary Cardiologist (physician) and Advanced Practice Providers (APPs -  Physician Assistants and Nurse Practitioners) who all work together to provide you with the care you need, when you need it.  We recommend signing up for the patient portal called "MyChart".  Sign up information is provided on this After Visit Summary.  MyChart is used to connect with patients for Virtual Visits (Telemedicine).  Patients are able to view lab/test results, encounter notes, upcoming appointments, etc.  Non-urgent messages can be sent to your provider as well.   To learn more about what you can do with MyChart, go to [email or web address].    Your next appointment:   4 month(s)  Provider:   Sherryl Manges, MD

## 2023-02-25 NOTE — Telephone Encounter (Signed)
Spoke with pt during telehealth visit

## 2023-03-08 DIAGNOSIS — Z85828 Personal history of other malignant neoplasm of skin: Secondary | ICD-10-CM | POA: Diagnosis not present

## 2023-03-08 DIAGNOSIS — L218 Other seborrheic dermatitis: Secondary | ICD-10-CM | POA: Diagnosis not present

## 2023-03-08 DIAGNOSIS — Z08 Encounter for follow-up examination after completed treatment for malignant neoplasm: Secondary | ICD-10-CM | POA: Diagnosis not present

## 2023-03-08 DIAGNOSIS — D225 Melanocytic nevi of trunk: Secondary | ICD-10-CM | POA: Diagnosis not present

## 2023-03-08 DIAGNOSIS — L570 Actinic keratosis: Secondary | ICD-10-CM | POA: Diagnosis not present

## 2023-03-08 DIAGNOSIS — L821 Other seborrheic keratosis: Secondary | ICD-10-CM | POA: Diagnosis not present

## 2023-03-08 DIAGNOSIS — L814 Other melanin hyperpigmentation: Secondary | ICD-10-CM | POA: Diagnosis not present

## 2023-03-15 DIAGNOSIS — R300 Dysuria: Secondary | ICD-10-CM | POA: Diagnosis not present

## 2023-03-15 DIAGNOSIS — R3912 Poor urinary stream: Secondary | ICD-10-CM | POA: Diagnosis not present

## 2023-03-15 DIAGNOSIS — R351 Nocturia: Secondary | ICD-10-CM | POA: Diagnosis not present

## 2023-03-25 DIAGNOSIS — E7800 Pure hypercholesterolemia, unspecified: Secondary | ICD-10-CM | POA: Diagnosis not present

## 2023-03-29 DIAGNOSIS — I480 Paroxysmal atrial fibrillation: Secondary | ICD-10-CM | POA: Diagnosis not present

## 2023-03-29 DIAGNOSIS — J302 Other seasonal allergic rhinitis: Secondary | ICD-10-CM | POA: Diagnosis not present

## 2023-03-29 DIAGNOSIS — Z1331 Encounter for screening for depression: Secondary | ICD-10-CM | POA: Diagnosis not present

## 2023-03-29 DIAGNOSIS — I700 Atherosclerosis of aorta: Secondary | ICD-10-CM | POA: Diagnosis not present

## 2023-03-29 DIAGNOSIS — D6869 Other thrombophilia: Secondary | ICD-10-CM | POA: Diagnosis not present

## 2023-03-29 DIAGNOSIS — I951 Orthostatic hypotension: Secondary | ICD-10-CM | POA: Diagnosis not present

## 2023-03-29 DIAGNOSIS — E7800 Pure hypercholesterolemia, unspecified: Secondary | ICD-10-CM | POA: Diagnosis not present

## 2023-03-29 DIAGNOSIS — Z0000 Encounter for general adult medical examination without abnormal findings: Secondary | ICD-10-CM | POA: Diagnosis not present

## 2023-03-29 DIAGNOSIS — N401 Enlarged prostate with lower urinary tract symptoms: Secondary | ICD-10-CM | POA: Diagnosis not present

## 2023-03-29 DIAGNOSIS — I2510 Atherosclerotic heart disease of native coronary artery without angina pectoris: Secondary | ICD-10-CM | POA: Diagnosis not present

## 2023-03-29 DIAGNOSIS — Z1321 Encounter for screening for nutritional disorder: Secondary | ICD-10-CM | POA: Diagnosis not present

## 2023-03-29 DIAGNOSIS — Z23 Encounter for immunization: Secondary | ICD-10-CM | POA: Diagnosis not present

## 2023-05-18 NOTE — Progress Notes (Unsigned)
Date:  05/18/2023   ID:  Jerry Barr, DOB Apr 03, 1941, MRN [id number]   PCP:  Tally Joe, MD  Cardiologist:  Ojas Coone [place], MD  Electrophysiologist:  Sherryl Manges, MD   Evaluation Performed:  Follow-Up Visit  Chief Complaint: shoulder pain.   History of Present Illness:    Jerry Barr is a 82 y.o. male seen for follow up. He has a  PMH of HLD, chronic hypotension, PAF on Xarelto, CAD s/p DES to LAD 2011 in [place] and BPH. He was diagnosed with new afib in 04/2016. Echo in 04/2016 showed EF 50-55%, no RWMA, mild MR. He had successful TEE DCCV 04/23/2016. He was placed on midodrine for orthostatic hypotension. He is not on any rate control therapy for the same reason. Myoview  In 06/2017 showed EF 55%, no significant ischemia or scar. Echo in 06/2017 EF 45-50%, mild diffuse hypokinesis, PA peak pressure .  Patient was seen in the  ED in April  2020 with recurrent afib and fatigue. Given his history of orthostatic hypotension, it was elected to proceed with cardioversion in the ED. This was performed by Dr. Gala Romney. Post cardioversion, his amiodarone was increased to 200mg  BID. After discharge, he was seen in the afib clinic with plans to consider decreasing amiodarone back to 200mg  daily after 1 month if no recurrent afib.   He was seen on 10/30/2018 for post hospital follow-up, at which time he noted  shoulder blade pain that occur with  walking. He had a  cardiac catheterization on 11/15/2018 which revealed 20% proximal RCA, 90% mid left circumflex lesion treated with a 2.25 x 16 mm Synergy DES, EF 55 to 65%.   He has been diagnosed with an REM sleep disorder managed by Dr Earl Gala. He was seen in the Afib clinic on 05/16/19 and was maintaining NSR on 200 mg of amiodarone. More recently  he reported recurrent exertional neck and pain in his shoulder blade that resolves with rest. These symptoms are similar to what he had in May and in 2011 with his initial stent. He underwent  repeat Cardiac cath on 06/25/19 and this showed only nonobstructive disease with patent stents.   He has since been seen by Dr Graciela Husbands for evaluation of his orthostatic hypotension- felt to be related to autonomic dysfunction. Echo was OK. Dr Graciela Husbands felt that amiodarone was making symptoms worse and this was discontinued. Event monitor showed AFib burden of 0.6% so AAD therapy was not needed. He was tried on low dose metoprolol but did not tolerate this due to fatigue and indigestion.   He was seen in February 2022 with recurrent and persistent AFib. Had DCCV but early return. He was admitted and started on Tikosyn. Had repeat DCCV on 08/29/20. Had recurrent Afib in March with DCCV. Subsequently had Afib ablation in May. Follow up with EP demonstrated no recurrence. Also seen by Dr Graciela Husbands for orthostatic intolerance and midodrine dose increased.   He was seen by Dr Graciela Husbands in July. Noted symptoms of fatigue and some dizziness with autonomic dysfunction. Was started on florinef but developed significant volume retention and this was discontinued.   On follow up today he is doing well. His orthostasis comes and goes. Had a lot more trouble with this at the end of Feb and into March. Has done better in April/May. One day in March he couldn't even get out of bed until 1 pm. Drinks extra salt water that seems to help. Also uses midodrine PRN. Denies any chest  pain or dyspnea. No Afib since ablation.  Past Medical History:  Diagnosis Date   Allergy    Aortic atherosclerosis (HCC)    Atrial fibrillation (HCC)    BPH (benign prostatic hyperplasia)    CAD (coronary artery disease)    Cancer (HCC)    basal cell ca - face, nose and right leg   Coronary artery disease    Promus drug-eluting stent to a 99% mid LAD, 40-50% narrowing in the distal LAD with mild to moderate disease in the circ and RCA b. 5/6 PCI/DESx1 to mLcx   Dyslipidemia    ED (erectile dysfunction)    Hearing loss    Hyperplastic colon polyp     Hypoglycemia    Orthostatic hypotension    REM sleep behavior disorder    not tested   Past Surgical History:  Procedure Laterality Date   ATRIAL FIBRILLATION ABLATION N/A 11/18/2020   Procedure: ATRIAL FIBRILLATION ABLATION;  Surgeon: Hillis Range, MD;  Location: [facility] INVASIVE CV LAB;  Service: Cardiovascular;  Laterality: N/A;   BUBBLE STUDY  08/27/2020   Procedure: BUBBLE STUDY;  Surgeon: Meriam Sprague, MD;  Location: [facility] ENDOSCOPY;  Service: Cardiovascular;;   CARDIOVERSION N/A 04/23/2016   Procedure: CARDIOVERSION;  Surgeon: Jake Bathe, MD;  Location: [facility] ENDOSCOPY;  Service: Cardiovascular;  Laterality: N/A;   CARDIOVERSION N/A 08/21/2020   Procedure: CARDIOVERSION;  Surgeon: Lars Masson, MD;  Location: [facility] ENDOSCOPY;  Service: Cardiovascular;  Laterality: N/A;   CARDIOVERSION N/A 08/29/2020   Procedure: CARDIOVERSION;  Surgeon: Thurmon Fair, MD;  Location: [facility] ENDOSCOPY;  Service: Cardiovascular;  Laterality: N/A;   CARDIOVERSION N/A 09/24/2020   Procedure: CARDIOVERSION;  Surgeon: Meriam Sprague, MD;  Location: [facility] ENDOSCOPY;  Service: Cardiovascular;  Laterality: N/A;   COLONOSCOPY     CORONARY ANGIOPLASTY WITH STENT PLACEMENT     CORONARY STENT INTERVENTION N/A 11/15/2018   Procedure: CORONARY STENT INTERVENTION;  Surgeon: Kathleene Hazel, MD;  Location: [facility] INVASIVE CV LAB;  Service: Cardiovascular;  Laterality: N/A;   CYSTOSCOPY W/ RETROGRADES Bilateral 12/30/2022   Procedure: CYSTOSCOPY WITH BILATERAL RETROGRADE PYELOGRAM;  Surgeon: Crist Fat, MD;  Location: [facility] ORS;  Service: Urology;  Laterality: Bilateral;   CYSTOSCOPY WITH BIOPSY N/A 12/30/2022   Procedure: CYSTOSCOPY WITH BLADDER BIOPSY;  Surgeon: Crist Fat, MD;  Location: [facility] ORS;  Service: Urology;  Laterality: N/A;  45 MINUTES   CYSTOSCOPY WITH FULGERATION N/A 12/30/2022   Procedure: CYSTOSCOPY WITH FULGERATION;  Surgeon: Crist Fat, MD;  Location: [facility] ORS;  Service:  Urology;  Laterality: N/A;   LEFT HEART CATH AND CORONARY ANGIOGRAPHY N/A 11/15/2018   Procedure: LEFT HEART CATH AND CORONARY ANGIOGRAPHY;  Surgeon: Kathleene Hazel, MD;  Location: [facility] INVASIVE CV LAB;  Service: Cardiovascular;  Laterality: N/A;   LEFT HEART CATH AND CORONARY ANGIOGRAPHY N/A 06/25/2019   Procedure: LEFT HEART CATH AND CORONARY ANGIOGRAPHY;  Surgeon: [place], Milbert Bixler M, MD;  Location: [facility] INVASIVE CV LAB;  Service: Cardiovascular;  Laterality: N/A;   POLYPECTOMY     TEE WITHOUT CARDIOVERSION N/A 04/23/2016   Procedure: TRANSESOPHAGEAL ECHOCARDIOGRAM (TEE);  Surgeon: Jake Bathe, MD;  Location: [facility] ENDOSCOPY;  Service: Cardiovascular;  Laterality: N/A;   TEE WITHOUT CARDIOVERSION N/A 08/27/2020   Procedure: TRANSESOPHAGEAL ECHOCARDIOGRAM (TEE);  Surgeon: Meriam Sprague, MD;  Location: [facility] ENDOSCOPY;  Service: Cardiovascular;  Laterality: N/A;   UPPER GASTROINTESTINAL ENDOSCOPY       No outpatient medications have been marked as taking for the 05/24/23 encounter (  Appointment) with [place], Koren Sermersheim M, MD.     Allergies:   Fludrocortisone, Amiodarone, Metoprolol, and Penicillins   Social History   Tobacco Use   Smoking status: Never   Smokeless tobacco: Never  Vaping Use   Vaping status: Never Used  Substance Use Topics   Alcohol use: Not Currently    Alcohol/week: 4.0 standard drinks of alcohol    Types: 4 Glasses of wine per week    Comment: 3oz of red wine   Drug use: No     Family Hx: The patient's family history includes Bone cancer in his paternal grandfather; Breast cancer in his mother; CAD in his father; Cancer in his brother; Colon cancer in his brother and mother; Esophageal cancer in his brother; Heart attack in his paternal grandmother; Leukemia in his paternal uncle; Liver cancer in his brother. There is no history of Pancreatic cancer, Prostate cancer, Rectal cancer, or Stomach cancer.  ROS:   Please see the history of present illness.     All  other systems reviewed and are negative.   Prior CV studies:   The following studies were reviewed today:  Echo 06/13/17: Study Conclusions   - Left ventricle: The cavity size was normal. Systolic function was   mildly reduced. The estimated ejection fraction was in the range   of 45% to 50%. Mild diffuse hypokinesis. Left ventricular   diastolic function parameters were normal. - Aortic valve: Transvalvular velocity was within the normal range.   There was no stenosis. There was trivial regurgitation. - Mitral valve: There was trivial regurgitation. - Left atrium: The atrium was moderately dilated. - Right ventricle: The cavity size was normal. Wall thickness was   normal. Systolic function was normal. - Right atrium: The atrium was severely dilated. - Atrial septum: No defect or patent foramen ovale was identified   by color flow Doppler. - Tricuspid valve: There was trivial regurgitation. - Pulmonary arteries: Systolic pressure was within the normal   range. PA peak pressure: 24 mm Hg (S).  Cath 11/15/2018 Prox RCA lesion is 20% stenosed. Mid Cx lesion is 90% stenosed. Previously placed Prox LAD to Mid LAD stent (unknown type) is widely patent. A drug-eluting stent was successfully placed using a STENT SYNERGY DES 2.25X16. Post intervention, there is a 0% residual stenosis. The left ventricular systolic function is normal. LV end diastolic pressure is normal. The left ventricular ejection fraction is 55-65% by visual estimate. There is no mitral valve regurgitation.   1. Patent mid LAD stent without restenosis 2. Severe stenosis mid Circumflex artery.  3. Successful PTCA/DES x 1 mid Circumflex 4. Mild non-obstructive disease in the RCA 5. Normal LV systolic function   Recommendations: ASA and Plavix for one month then stop ASA. Will restart Xarelto tomorrow. Would recommend at least six month of Plavix and Xarelto but could consider stopping Plavix at 6 months if necessary.  Likely same day PCI discharge.    Cardiac cath 06/25/19: Procedures  LEFT HEART CATH AND CORONARY ANGIOGRAPHY  Conclusion    Non-stenotic Prox LAD to Mid LAD lesion was previously treated. Previously placed Mid Cx drug eluting stent is widely patent. Balloon angioplasty was performed. Prox RCA lesion is 20% stenosed. The left ventricular systolic function is normal. LV end diastolic pressure is normal. The left ventricular ejection fraction is 55-65% by visual estimate.   1. Nonobstructive CAD- patent stents in LAD and LCx. 2. Normal LV function 3. Normal LVEDP   Plan: continue medical therapy.  Echo 12/14/19: IMPRESSIONS     1. Left ventricular ejection fraction, by estimation, is 55 to 60%. The  left ventricle has normal function. The left ventricle has no regional  wall motion abnormalities. Left ventricular diastolic parameters were  normal. The average left ventricular  global longitudinal strain is -20.5 %. The global longitudinal strain is  normal.   2. Right ventricular systolic function is normal. The right ventricular  size is normal. There is normal pulmonary artery systolic pressure. The  estimated right ventricular systolic pressure is 31.5 mmHg.   3. The mitral valve is normal in structure. Mild mitral valve  regurgitation. No evidence of mitral stenosis.   4. The aortic valve is normal in structure. Aortic valve regurgitation is  not visualized. No aortic stenosis is present.   5. The inferior vena cava is normal in size with greater than 50%  respiratory variability, suggesting right atrial pressure of 3 mmHg.    Event monitor 01/01/20: Study Highlights  Indication: palpitations   Duration: 30d   Findings HR  avg 67  Min 51-Max 127  PVCs <1% PACs about 1 % AFib noted < 1% Recommendations Will try low dose BB See note  TEE 08/27/20: IMPRESSIONS     1. Left ventricular ejection fraction, by estimation, is 55 to 60%. The  left ventricle has  normal function.   2. Right ventricular systolic function is normal. The right ventricular  size is normal. There is normal pulmonary artery systolic pressure.   3. Left atrial size was moderately dilated. No left atrial/left atrial  appendage thrombus was detected.   4. Right atrial size was mildly dilated.   5. The mitral valve is grossly normal. Mild mitral valve regurgitation.   6. The aortic valve is tricuspid. There is mild thickening of the aortic  valve. Aortic valve regurgitation is not visualized. Mild aortic valve  sclerosis is present, with no evidence of aortic valve stenosis.   7. Agitated saline contrast bubble study was negative, with no evidence  of any interatrial shunt.   Labs/Other Tests and Data Reviewed:     Recent Labs: 11/30/2022: Magnesium 2.3 12/01/2022: Hemoglobin 13.9; Platelets 211 02/08/2023: BUN 19; Creatinine, Ser 1.04; Potassium 4.5; Sodium 143   Recent Lipid Panel Lab Results  Component Value Date/Time   CHOL 120 03/23/2019 08:38 AM   TRIG 42 03/23/2019 08:38 AM   HDL 50 03/23/2019 08:38 AM   CHOLHDL 2.4 03/23/2019 08:38 AM   CHOLHDL 3.0 06/29/2016 10:11 AM   LDLCALC 59 03/23/2019 08:38 AM   Dated 02/20/20: choleseterol 128, triglycerides 38, HDL 54, LDL 64. Dated 03/06/21: cholesterol 126, triglycerides 37, HDL 50, LDL 66. CMET, TSH, CBC normal.  Dated 03/23/22: cholesterol 118, triglycerides 42, HDL 43, LDL 64. CMET, TSH, CBC normal.  Dated 03/25/23: cholesterol 127, triglycerides 42, HDL 44, LDL 73, CBC, CMET, and TSH normal.    Wt Readings from Last 3 Encounters:  02/22/23 160 lb (72.6 kg)  01/26/23 161 lb 9.6 oz (73.3 kg)  12/30/22 159 lb 8 oz (72.3 kg)     Objective:    Vital Signs:  There were no vitals taken for this visit.   GENERAL:  Well appearing WM in NAD HEENT:  PERRL, EOMI, sclera are clear. Oropharynx is clear. NECK:  No jugular venous distention, carotid upstroke brisk and symmetric, no bruits, no thyromegaly or  adenopathy LUNGS:  Clear to auscultation bilaterally CHEST:  Unremarkable HEART:  RRR,  PMI not displaced or sustained,S1 and S2 within normal  limits, no S3, no S4: no clicks, no rubs, no murmurs ABD:  Soft, nontender. BS +, no masses or bruits. No hepatomegaly, no splenomegaly EXT:  2 + pulses throughout, no edema, no cyanosis no clubbing. No radial site hematoma SKIN:  Warm and dry.  No rashes NEURO:  Alert and oriented x 3. Cranial nerves II through XII intact. PSYCH:  Cognitively intact    ASSESSMENT & PLAN:    1.   CAD: s/p PCI of the LCx with DES in May 2020.  Prior stent of the mid LAD in 2011. Repeat cardiac cath in Dec 2020 showed nonobstructive disease.curreenlty angina free.  Antianginal therapy limited by orthostatic hypotension. He is asymptomatic now.  2.   PAF: s/p DCCV on October 15, 2019. Recurrent in Feb 2022 and March 2022. Now s/p Afib ablation in May 2022.  Continue Tikosyn.  Continue Xarelto therapy.   Unable to tolerate  rate control therapy with metoprolol  or amiodarone due to side effects. Maintaining NSR so far. Followed by Dr Graciela Husbands. Will update CBC, BMET and mag level today.  3.   Chronic orthostatic hypotension/ autonomic failure: intolerant of  higher dose of midodrine. Increased salt water intake. His blood pressure is stable. He has learned to manage it.     4.   Hyperlipidemia: Continue current statin. Excellent control.     Disposition:  6 month   Signed, Harsh Trulock [place], MD  05/18/2023 2:49 PM    [facility]

## 2023-05-24 ENCOUNTER — Ambulatory Visit: Payer: Medicare Other | Attending: Cardiology | Admitting: Cardiology

## 2023-05-24 ENCOUNTER — Encounter: Payer: Self-pay | Admitting: Cardiology

## 2023-05-24 VITALS — BP 130/86 | HR 71 | Ht 72.0 in | Wt 167.0 lb

## 2023-05-24 DIAGNOSIS — E7800 Pure hypercholesterolemia, unspecified: Secondary | ICD-10-CM | POA: Diagnosis not present

## 2023-05-24 DIAGNOSIS — I480 Paroxysmal atrial fibrillation: Secondary | ICD-10-CM | POA: Diagnosis not present

## 2023-05-24 DIAGNOSIS — Z23 Encounter for immunization: Secondary | ICD-10-CM | POA: Diagnosis not present

## 2023-05-24 DIAGNOSIS — I2510 Atherosclerotic heart disease of native coronary artery without angina pectoris: Secondary | ICD-10-CM | POA: Insufficient documentation

## 2023-05-24 DIAGNOSIS — G909 Disorder of the autonomic nervous system, unspecified: Secondary | ICD-10-CM | POA: Diagnosis not present

## 2023-05-24 NOTE — Patient Instructions (Signed)

## 2023-06-15 ENCOUNTER — Telehealth: Payer: Self-pay

## 2023-06-15 DIAGNOSIS — B079 Viral wart, unspecified: Secondary | ICD-10-CM | POA: Diagnosis not present

## 2023-06-15 DIAGNOSIS — D485 Neoplasm of uncertain behavior of skin: Secondary | ICD-10-CM | POA: Diagnosis not present

## 2023-06-15 DIAGNOSIS — L570 Actinic keratosis: Secondary | ICD-10-CM | POA: Diagnosis not present

## 2023-06-15 DIAGNOSIS — L821 Other seborrheic keratosis: Secondary | ICD-10-CM | POA: Diagnosis not present

## 2023-06-15 MED ORDER — PROPRANOLOL HCL 20 MG PO TABS: 20.0000 mg | ORAL_TABLET | ORAL | 0 refills | Status: DC | PRN

## 2023-06-16 NOTE — Telephone Encounter (Signed)
See MyChart message

## 2023-06-22 ENCOUNTER — Ambulatory Visit: Payer: Medicare Other | Attending: Internal Medicine | Admitting: Internal Medicine

## 2023-06-22 ENCOUNTER — Encounter: Payer: Self-pay | Admitting: Internal Medicine

## 2023-06-22 VITALS — BP 112/64 | HR 83 | Ht 72.0 in | Wt 169.8 lb

## 2023-06-22 DIAGNOSIS — I4819 Other persistent atrial fibrillation: Secondary | ICD-10-CM | POA: Insufficient documentation

## 2023-06-22 DIAGNOSIS — R002 Palpitations: Secondary | ICD-10-CM | POA: Insufficient documentation

## 2023-06-22 DIAGNOSIS — I951 Orthostatic hypotension: Secondary | ICD-10-CM | POA: Diagnosis not present

## 2023-06-22 MED ORDER — PROPRANOLOL HCL 20 MG PO TABS: 20.0000 mg | ORAL_TABLET | ORAL | 0 refills | Status: DC | PRN

## 2023-06-22 NOTE — Patient Instructions (Addendum)
Medication Instructions:  Your physician recommends that you continue on your current medications as directed. Please refer to the Current Medication list given to you today.  *If you need a refill on your cardiac medications before your next appointment, please call your pharmacy*   Lab Work: None ordered.  If you have labs (blood work) drawn today and your tests are completely normal, you will receive your results only by: MyChart Message (if you have MyChart) OR A paper copy in the mail If you have any lab test that is abnormal or we need to change your treatment, we will call you to review the results.   Testing/Procedures: None ordered.    Follow-Up: At [facility], you and your health needs are our priority.  As part of our continuing mission to provide you with exceptional heart care, we have created designated Provider Care Teams.  These Care Teams include your primary Cardiologist (physician) and Advanced Practice Providers (APPs -  Physician Assistants and Nurse Practitioners) who all work together to provide you with the care you need, when you need it.  We recommend signing up for the patient portal called "MyChart".  Sign up information is provided on this After Visit Summary.  MyChart is used to connect with patients for Virtual Visits (Telemedicine).  Patients are able to view lab/test results, encounter notes, upcoming appointments, etc.  Non-urgent messages can be sent to your provider as well.   To learn more about what you can do with MyChart, go to [email or web address].    Your next appointment:   6 months with Dr Klein 

## 2023-06-22 NOTE — Progress Notes (Signed)
Patient Care Team: Tally Joe, MD as PCP - General (Family Medicine) [place], Peter M, MD as PCP - Cardiology (Cardiology) Duke Salvia, MD as PCP - Electrophysiology (Cardiology) Janalyn Harder, MD (Inactive) as Consulting Physician (Dermatology)   HPI  Jerry Barr is a 82 y.o. male seen in follow-up for atrial fibrillation and orthostatic hypotension with little heart rate response concerning for more systemic autonomic insufficiency.  Improved with discontinuation of amiodarone, but more atrial fibrillation   Underwent AFib ablation 5/22 (JA)  no interval atrial fib of which he is aware;   continues on dofetilide and rivaroxaban History of remote coronary artery disease with stenting LAD 2011 circumflex 2020  Efforts to use fludrocortisone were not tolerated secondary to edema.   No longer taking midodrine    Interval episode of atrial fibrillation.  Self terminating.  Occurred while walking in the cold.  The patient denies chest pain, shortness of breath, nocturnal dyspnea, orthopnea or peripheral edema.  There have been no syncope.  Complains of lightheadedness but this has been much improved over the last month.Marland Kitchen     DATE TEST EF    10/17 Echo   50-55% %    12/18 Echo  45-50%    12/20 LHC   LAD Stents patent  Cx-stent patent; RCA patent    Date Cr K Mg Hgb  5/21 1.26 4.6   13.9   4/22  0.96 4.9  2.5  14.5  6/23 0.98 4.7 2.3 14.5  5/24 0.98 4.1<<5.8(lab error) 2.3 13.9     Thromboembolic risk factors ( age -49, HTN-1, Vasc disease -1) for a CHADSVASc Score of >=4     Records and Results Reviewed   Past Medical History:  Diagnosis Date   Allergy    Aortic atherosclerosis (HCC)    Atrial fibrillation (HCC)    BPH (benign prostatic hyperplasia)    CAD (coronary artery disease)    Cancer (HCC)    basal cell ca - face, nose and right leg   Coronary artery disease    Promus drug-eluting stent to a 99% mid LAD, 40-50% narrowing in the distal LAD with mild  to moderate disease in the circ and RCA b. 5/6 PCI/DESx1 to mLcx   Dyslipidemia    ED (erectile dysfunction)    Hearing loss    Hyperplastic colon polyp    Hypoglycemia    Orthostatic hypotension    REM sleep behavior disorder    not tested    Past Surgical History:  Procedure Laterality Date   ATRIAL FIBRILLATION ABLATION N/A 11/18/2020   Procedure: ATRIAL FIBRILLATION ABLATION;  Surgeon: Hillis Range, MD;  [facility];  Service: Cardiovascular;  Laterality: N/A;   BUBBLE STUDY  08/27/2020   Procedure: BUBBLE STUDY;  Surgeon: Meriam Sprague, MD;  Location: [facility] ENDOSCOPY;  Service: Cardiovascular;;   CARDIOVERSION N/A 04/23/2016   Procedure: CARDIOVERSION;  Surgeon: Jake Bathe, MD;  Location: [facility] ENDOSCOPY;  Service: Cardiovascular;  Laterality: N/A;   CARDIOVERSION N/A 08/21/2020   Procedure: CARDIOVERSION;  Surgeon: Lars Masson, MD;  Location: [facility] ENDOSCOPY;  Service: Cardiovascular;  Laterality: N/A;   CARDIOVERSION N/A 08/29/2020   Procedure: CARDIOVERSION;  Surgeon: Thurmon Fair, MD;  Location: [facility] ENDOSCOPY;  Service: Cardiovascular;  Laterality: N/A;   CARDIOVERSION N/A 09/24/2020   Procedure: CARDIOVERSION;  Surgeon: Meriam Sprague, MD;  Location: [facility] ENDOSCOPY;  Service: Cardiovascular;  Laterality: N/A;   COLONOSCOPY     CORONARY ANGIOPLASTY WITH STENT  PLACEMENT     CORONARY STENT INTERVENTION N/A 11/15/2018   Procedure: CORONARY STENT INTERVENTION;  Surgeon: Kathleene Hazel, MD;  [facility]: [facility];  Service: Cardiovascular;  Laterality: N/A;   CYSTOSCOPY W/ RETROGRADES Bilateral 12/30/2022   Procedure: CYSTOSCOPY WITH BILATERAL RETROGRADE PYELOGRAM;  Surgeon: Crist Fat, MD;  Location: [facility] ORS;  Service: Urology;  Laterality: Bilateral;   CYSTOSCOPY WITH BIOPSY N/A 12/30/2022   Procedure: CYSTOSCOPY WITH BLADDER BIOPSY;  Surgeon: Crist Fat, MD;  Location: [facility] ORS;  Service: Urology;  Laterality: N/A;  45  MINUTES   CYSTOSCOPY WITH FULGERATION N/A 12/30/2022   Procedure: CYSTOSCOPY WITH FULGERATION;  Surgeon: Crist Fat, MD;  Location: [facility] ORS;  Service: Urology;  Laterality: N/A;   LEFT HEART CATH AND CORONARY ANGIOGRAPHY N/A 11/15/2018   Procedure: LEFT HEART CATH AND CORONARY ANGIOGRAPHY;  Surgeon: Kathleene Hazel, MD;  [facility];  Service: Cardiovascular;  Laterality: N/A;   LEFT HEART CATH AND CORONARY ANGIOGRAPHY N/A 06/25/2019   Procedure: LEFT HEART CATH AND CORONARY ANGIOGRAPHY;  Surgeon: [place], Peter M, MD;  Location: [facility] INVASIVE CV LAB;  Service: Cardiovascular;  Laterality: N/A;   POLYPECTOMY     TEE WITHOUT CARDIOVERSION N/A 04/23/2016   Procedure: TRANSESOPHAGEAL ECHOCARDIOGRAM (TEE);  Surgeon: Jake Bathe, MD;  Location: [facility] ENDOSCOPY;  Service: Cardiovascular;  Laterality: N/A;   TEE WITHOUT CARDIOVERSION N/A 08/27/2020   Procedure: TRANSESOPHAGEAL ECHOCARDIOGRAM (TEE);  Surgeon: Meriam Sprague, MD;  Location: [facility] ENDOSCOPY;  Service: Cardiovascular;  Laterality: N/A;   UPPER GASTROINTESTINAL ENDOSCOPY      Current Meds  Medication Sig   acetaminophen (TYLENOL) 500 MG tablet Take 500 mg by mouth every 6 (six) hours as needed for mild pain (or headaches).   atorvastatin (LIPITOR) 10 MG tablet Take 1 tablet (10 mg total) by mouth daily.   Calcium & Magnesium Carbonates (MYLANTA PO) Take by mouth as needed.   Cholecalciferol (VITAMIN D-3) 1000 UNITS CAPS Take 2,000 Units by mouth daily.   co-enzyme Q-10 30 MG capsule Take 30 mg by mouth daily.   dofetilide (TIKOSYN) 500 MCG capsule TAKE 1 CAPSULE BY MOUTH TWICE A DAY   fluticasone (FLONASE) 50 MCG/ACT nasal spray Place 1 spray into both nostrils as needed.   loratadine (CLARITIN) 10 MG tablet Take 10 mg by mouth daily.   Melatonin 3 MG TABS Take 6 mg by mouth at bedtime.   midodrine (PROAMATINE) 5 MG tablet Take 5 mg by mouth daily as needed (hypotension).   olopatadine (PATANOL) 0.1 %  ophthalmic solution Place 1 drop into both eyes daily as needed (allergies).   polyethylene glycol (MIRALAX / GLYCOLAX) 17 g packet Take 17 g by mouth daily as needed for mild constipation.   rivaroxaban (XARELTO) 20 MG TABS tablet Take 1 tablet (20 mg total) by mouth daily.   Simethicone (GAS-X EXTRA STRENGTH) 125 MG CAPS Take by mouth as needed.    Allergies  Allergen Reactions   Fludrocortisone Shortness Of Breath   Amiodarone Other (See Comments)   Metoprolol     Other reaction(s): fatigue   Penicillins Rash    Did it involve swelling of the face/tongue/throat, SOB, or low BP? No Did it involve sudden or severe rash/hives, skin peeling, or any reaction on the inside of your mouth or nose? No Did you need to seek medical attention at a hospital or doctor's office? No When did it last happen?      50+ years  If  all above answers are "NO", may proceed with cephalosporin use.       Review of Systems negative except from HPI and PMH  Physical Exam BP 112/64 (Patient Position: Standing)   Pulse 83   Ht 6' (1.829 m)   Wt 169 lb 12.8 oz (77 kg)   SpO2 97%   BMI 23.03 kg/m  Well developed and nourished in no acute distress HENT normal Neck supple with JVP-  flat  Clear Regular rate and rhythm, no murmurs or gallops Abd-soft with active BS No Clubbing cyanosis edema Skin-warm and dry A & Oriented  Grossly normal sensory and motor function  ECG sinus at 71 Intervals 18/09/43  Assessment and  Plan Orthostatic intolerance with modest change in heart rate with following blood pressure  Coronary artery disease with prior stenting   Atrial fibrillation  High Risk Medication Surveillance dofetilide   Orthostasis is surprisingly much better.  No longer taking midodrine  Interval   episode of atrial fibrillation.  Self terminating.  Will prescribe the propranolol 20 to take as needed for tach cardia   Without symptoms of ischemia.  Continue same statin,

## 2023-07-13 ENCOUNTER — Other Ambulatory Visit: Payer: Self-pay | Admitting: Internal Medicine

## 2023-08-15 ENCOUNTER — Other Ambulatory Visit: Payer: Self-pay | Admitting: Cardiology

## 2023-08-17 ENCOUNTER — Other Ambulatory Visit: Payer: Self-pay

## 2023-08-17 MED ORDER — ATORVASTATIN CALCIUM 20 MG PO TABS: ORAL_TABLET | ORAL | 3 refills | Status: AC

## 2023-08-17 MED ORDER — RIVAROXABAN 20 MG PO TABS: 20.0000 mg | ORAL_TABLET | Freq: Every day | ORAL | 3 refills | Status: AC

## 2023-09-13 DIAGNOSIS — L218 Other seborrheic dermatitis: Secondary | ICD-10-CM | POA: Diagnosis not present

## 2023-09-13 DIAGNOSIS — L570 Actinic keratosis: Secondary | ICD-10-CM | POA: Diagnosis not present

## 2023-09-13 DIAGNOSIS — L821 Other seborrheic keratosis: Secondary | ICD-10-CM | POA: Diagnosis not present

## 2023-09-13 DIAGNOSIS — D485 Neoplasm of uncertain behavior of skin: Secondary | ICD-10-CM | POA: Diagnosis not present

## 2023-09-13 DIAGNOSIS — Z85828 Personal history of other malignant neoplasm of skin: Secondary | ICD-10-CM | POA: Diagnosis not present

## 2023-09-13 DIAGNOSIS — D225 Melanocytic nevi of trunk: Secondary | ICD-10-CM | POA: Diagnosis not present

## 2023-09-13 DIAGNOSIS — L814 Other melanin hyperpigmentation: Secondary | ICD-10-CM | POA: Diagnosis not present

## 2023-09-13 DIAGNOSIS — Z08 Encounter for follow-up examination after completed treatment for malignant neoplasm: Secondary | ICD-10-CM | POA: Diagnosis not present

## 2023-09-29 DIAGNOSIS — I700 Atherosclerosis of aorta: Secondary | ICD-10-CM | POA: Diagnosis not present

## 2023-09-29 DIAGNOSIS — I2510 Atherosclerotic heart disease of native coronary artery without angina pectoris: Secondary | ICD-10-CM | POA: Diagnosis not present

## 2023-09-29 DIAGNOSIS — I480 Paroxysmal atrial fibrillation: Secondary | ICD-10-CM | POA: Diagnosis not present

## 2023-10-10 DIAGNOSIS — I2510 Atherosclerotic heart disease of native coronary artery without angina pectoris: Secondary | ICD-10-CM | POA: Diagnosis not present

## 2023-10-10 DIAGNOSIS — N401 Enlarged prostate with lower urinary tract symptoms: Secondary | ICD-10-CM | POA: Diagnosis not present

## 2023-10-10 DIAGNOSIS — I700 Atherosclerosis of aorta: Secondary | ICD-10-CM | POA: Diagnosis not present

## 2023-10-10 DIAGNOSIS — I480 Paroxysmal atrial fibrillation: Secondary | ICD-10-CM | POA: Diagnosis not present

## 2023-10-10 DIAGNOSIS — E7800 Pure hypercholesterolemia, unspecified: Secondary | ICD-10-CM | POA: Diagnosis not present

## 2023-10-28 DIAGNOSIS — I700 Atherosclerosis of aorta: Secondary | ICD-10-CM | POA: Diagnosis not present

## 2023-10-28 DIAGNOSIS — I480 Paroxysmal atrial fibrillation: Secondary | ICD-10-CM | POA: Diagnosis not present

## 2023-10-28 DIAGNOSIS — I2510 Atherosclerotic heart disease of native coronary artery without angina pectoris: Secondary | ICD-10-CM | POA: Diagnosis not present

## 2023-11-09 DIAGNOSIS — I480 Paroxysmal atrial fibrillation: Secondary | ICD-10-CM | POA: Diagnosis not present

## 2023-11-09 DIAGNOSIS — N401 Enlarged prostate with lower urinary tract symptoms: Secondary | ICD-10-CM | POA: Diagnosis not present

## 2023-11-09 DIAGNOSIS — I700 Atherosclerosis of aorta: Secondary | ICD-10-CM | POA: Diagnosis not present

## 2023-11-09 DIAGNOSIS — E7800 Pure hypercholesterolemia, unspecified: Secondary | ICD-10-CM | POA: Diagnosis not present

## 2023-11-09 DIAGNOSIS — I2510 Atherosclerotic heart disease of native coronary artery without angina pectoris: Secondary | ICD-10-CM | POA: Diagnosis not present

## 2023-11-14 DIAGNOSIS — C44311 Basal cell carcinoma of skin of nose: Secondary | ICD-10-CM | POA: Diagnosis not present

## 2023-11-19 NOTE — Progress Notes (Unsigned)
 Date:  11/22/2023   ID:  Jerry Barr, DOB 1941-01-26, MRN [id number]   PCP:  Rae Bugler, MD  Cardiologist:  Malayia Spizzirri [place], MD  Electrophysiologist:  Richardo Chandler, MD   Evaluation Performed:  Follow-Up Visit  Chief Complaint:CAD  History of Present Illness:    Jerry Barr is a 83 y.o. male seen for follow up. He has a  PMH of HLD, autonomic dysfunction, PAF on Xarelto , CAD s/p DES to LAD 2011 in [place] and BPH. He was diagnosed with new afib in 04/2016. Echo in 04/2016 showed EF 50-55%, no RWMA, mild MR. He had successful TEE DCCV 04/23/2016. He was placed on midodrine  for orthostatic hypotension.  Myoview   In 06/2017 showed EF 55%, no significant ischemia or scar. Echo in 06/2017 EF 45-50%, mild diffuse hypokinesis, PA peak pressure .  Patient was seen in the  ED in April  2020 with recurrent afib and fatigue. Given his history of orthostatic hypotension, it was elected to proceed with cardioversion in the ED. This was performed by Dr. Bensimhon. Post cardioversion, his amiodarone  was increased to 200mg  BID. After discharge, he was seen in the afib clinic with plans to consider decreasing amiodarone  back to 200mg  daily after 1 month if no recurrent afib.   He was seen on 10/30/2018 for post hospital follow-up, at which time he noted  shoulder blade pain that occur with  walking. He had a  cardiac catheterization on 11/15/2018 which revealed 20% proximal RCA, 90% mid left circumflex lesion treated with a 2.25 x 16 mm Synergy DES, EF 55 to 65%.   He underwent repeat Cardiac cath on 06/25/19 and this showed only nonobstructive disease with patent stents.   He has since been seen by Dr Rodolfo Clan for evaluation of his orthostatic hypotension- felt to be related to autonomic dysfunction. Echo was OK. Dr Rodolfo Clan felt that amiodarone  was making symptoms worse and this was discontinued. Event monitor showed AFib burden of 0.6% so AAD therapy was not needed. He was tried on low dose  metoprolol  but did not tolerate this due to fatigue and indigestion.   He was seen in February 2022 with recurrent and persistent AFib. Had DCCV but early return. He was admitted and started on Tikosyn . Had repeat DCCV on 08/29/20. Had recurrent Afib in March with DCCV. Subsequently had Afib ablation in May. Follow up with EP demonstrated no recurrence. Also seen by Dr Rodolfo Clan for orthostatic intolerance and midodrine  dose increased.   He was seen by Dr Rodolfo Clan in July. Noted symptoms of fatigue and some dizziness with autonomic dysfunction. Was started on florinef  but developed significant volume retention and this was discontinued. Lipitor dose was reduced to help with muscle fatigue.  On follow up today he is managing well. No angina. No Afib. Still deals with the orthostasis and takes midodrine  as needed. Notes this is cyclical. Took 2 midodrine  today.     Past Medical History:  Diagnosis Date   Allergy    Aortic atherosclerosis (HCC)    Atrial fibrillation (HCC)    BPH (benign prostatic hyperplasia)    CAD (coronary artery disease)    Cancer (HCC)    basal cell ca - face, nose and right leg   Coronary artery disease    Promus drug-eluting stent to a 99% mid LAD, 40-50% narrowing in the distal LAD with mild to moderate disease in the circ and RCA b. 5/6 PCI/DESx1 to mLcx   Dyslipidemia    ED (erectile dysfunction)  Hearing loss    Hyperplastic colon polyp    Hypoglycemia    Orthostatic hypotension    REM sleep behavior disorder    not tested   Past Surgical History:  Procedure Laterality Date   ATRIAL FIBRILLATION ABLATION N/A 11/18/2020   Procedure: ATRIAL FIBRILLATION ABLATION;  Surgeon: Jolly Needle, MD;  [facility];  Service: Cardiovascular;  Laterality: N/A;   BUBBLE STUDY  08/27/2020   Procedure: BUBBLE STUDY;  Surgeon: Sonny Dust, MD;  Location: [facility] ENDOSCOPY;  Service: Cardiovascular;;   CARDIOVERSION N/A 04/23/2016   Procedure: CARDIOVERSION;   Surgeon: Hugh Madura, MD;  Location: [facility] ENDOSCOPY;  Service: Cardiovascular;  Laterality: N/A;   CARDIOVERSION N/A 08/21/2020   Procedure: CARDIOVERSION;  Surgeon: Liza Riggers, MD;  Location: [facility] ENDOSCOPY;  Service: Cardiovascular;  Laterality: N/A;   CARDIOVERSION N/A 08/29/2020   Procedure: CARDIOVERSION;  Surgeon: Luana Rumple, MD;  Location: [facility] ENDOSCOPY;  Service: Cardiovascular;  Laterality: N/A;   CARDIOVERSION N/A 09/24/2020   Procedure: CARDIOVERSION;  Surgeon: Sonny Dust, MD;  Location: [facility] ENDOSCOPY;  Service: Cardiovascular;  Laterality: N/A;   COLONOSCOPY     CORONARY ANGIOPLASTY WITH STENT PLACEMENT     CORONARY STENT INTERVENTION N/A 11/15/2018   Procedure: CORONARY STENT INTERVENTION;  Surgeon: Odie Benne, MD;  [facility]: [facility];  Service: Cardiovascular;  Laterality: N/A;   CYSTOSCOPY W/ RETROGRADES Bilateral 12/30/2022   Procedure: CYSTOSCOPY WITH BILATERAL RETROGRADE PYELOGRAM;  Surgeon: Andrez Banker, MD;  Location: [facility] ORS;  Service: Urology;  Laterality: Bilateral;   CYSTOSCOPY WITH BIOPSY N/A 12/30/2022   Procedure: CYSTOSCOPY WITH BLADDER BIOPSY;  Surgeon: Andrez Banker, MD;  Location: [facility];  Service: Urology;  Laterality: N/A;  45 MINUTES   CYSTOSCOPY WITH FULGERATION N/A 12/30/2022   Procedure: CYSTOSCOPY WITH FULGERATION;  Surgeon: Andrez Banker, MD;  Location: [facility] OR[facility];  Service: Urology;  Laterality: N/A;   LEFT HEART CATH AND CORONARY ANGIOGRAPHY N/A 11/15/2018   Procedure: LEFT HEART CATH AND CORONARY ANGIOGRAPHY;  Surgeon: Odie Benne, MD;  [facility];  Service: Cardiovascular;  Laterality: N/A;   LEFT HEART CATH AND CORONARY ANGIOGRAPHY N/A 06/25/2019   Procedure: LEFT HEART CATH AND CORONARY ANGIOGRAPHY;  Surgeon: Barr, Demaris Bousquet M, MD;  Location: [facility] INVASIVE CV LAB;  Service: Cardiovascular;  Laterality: N/A;   POLYPECTOMY     TEE WITHOUT CARDIOVERSION N/A 04/23/2016   Procedure:  TRANSESOPHAGEAL ECHOCARDIOGRAM (TEE);  Surgeon: Hugh Madura, MD;  Location: [facility] ENDOSCOPY;  Service: Cardiovascular;  Laterality: N/A;   TEE WITHOUT CARDIOVERSION N/A 08/27/2020   Procedure: TRANSESOPHAGEAL ECHOCARDIOGRAM (TEE);  Surgeon: Sonny Dust, MD;  Location: [facility] ENDOSCOPY;  Service: Cardiovascular;  Laterality: N/A;   UPPER GASTROINTESTINAL ENDOSCOPY       Current Meds  Medication Sig   acetaminophen  (TYLENOL ) 500 MG tablet Take 500 mg by mouth every 6 (six) hours as needed for mild pain (or headaches).   atorvastatin  (LIPITOR) 20 MG tablet Take 1/2 tablet 10 mg daily   Calcium  & Magnesium Carbonates (MYLANTA PO) Take by mouth as needed.   Cholecalciferol  (VITAMIN D -3) 1000 UNITS CAPS Take 2,000 Units by mouth daily.   co-enzyme Q-10 30 MG capsule Take 30 mg by mouth daily.   dofetilide  (TIKOSYN ) 500 MCG capsule TAKE 1 CAPSULE BY MOUTH TWICE A DAY   fluticasone  (FLONASE ) 50 MCG/ACT nasal spray Place 1 spray into both nostrils as needed.   loratadine  (CLARITIN ) 10 MG tablet Take 10  mg by mouth daily.   Melatonin 3 MG TABS Take 6 mg by mouth at bedtime.   midodrine  (PROAMATINE ) 5 MG tablet Take 5 mg by mouth daily as needed (hypotension).   olopatadine (PATANOL) 0.1 % ophthalmic solution Place 1 drop into both eyes daily as needed (allergies).   polyethylene glycol (MIRALAX  / GLYCOLAX ) 17 g packet Take 17 g by mouth daily as needed for mild constipation.   rivaroxaban  (XARELTO ) 20 MG TABS tablet Take 1 tablet (20 mg total) by mouth daily.   Simethicone (GAS-X EXTRA STRENGTH) 125 MG CAPS Take by mouth as needed.     Allergies:   Fludrocortisone , Amiodarone , Metoprolol , and Penicillins   Social History   Tobacco Use   Smoking status: Never   Smokeless tobacco: Never  Vaping Use   Vaping status: Never Used  Substance Use Topics   Alcohol use: Not Currently    Alcohol/week: 4.0 standard drinks of alcohol    Types: 4 Glasses of wine per week    Comment: 3oz of red wine    Drug use: No     Family Hx: The patient's family history includes Bone cancer in his paternal grandfather; Breast cancer in his mother; CAD in his father; Cancer in his brother; Colon cancer in his brother and mother; Esophageal cancer in his brother; Heart attack in his paternal grandmother; Leukemia in his paternal uncle; Liver cancer in his brother. There is no history of Pancreatic cancer, Prostate cancer, Rectal cancer, or Stomach cancer.  ROS:   Please see the history of present illness.     All other systems reviewed and are negative.   Prior CV studies:   The following studies were reviewed today:  Echo 06/13/17: Study Conclusions   - Left ventricle: The cavity size was normal. Systolic function was   mildly reduced. The estimated ejection fraction was in the range   of 45% to 50%. Mild diffuse hypokinesis. Left ventricular   diastolic function parameters were normal. - Aortic valve: Transvalvular velocity was within the normal range.   There was no stenosis. There was trivial regurgitation. - Mitral valve: There was trivial regurgitation. - Left atrium: The atrium was moderately dilated. - Right ventricle: The cavity size was normal. Wall thickness was   normal. Systolic function was normal. - Right atrium: The atrium was severely dilated. - Atrial septum: No defect or patent foramen ovale was identified   by color flow Doppler. - Tricuspid valve: There was trivial regurgitation. - Pulmonary arteries: Systolic pressure was within the normal   range. PA peak pressure: 24 mm Hg (S).  Cath 11/15/2018 Prox RCA lesion is 20% stenosed. Mid Cx lesion is 90% stenosed. Previously placed Prox LAD to Mid LAD stent (unknown type) is widely patent. A drug-eluting stent was successfully placed using a STENT SYNERGY DES 2.25X16. Post intervention, there is a 0% residual stenosis. The left ventricular systolic function is normal. LV end diastolic pressure is normal. The left  ventricular ejection fraction is 55-65% by visual estimate. There is no mitral valve regurgitation.   1. Patent mid LAD stent without restenosis 2. Severe stenosis mid Circumflex artery.  3. Successful PTCA/DES x 1 mid Circumflex 4. Mild non-obstructive disease in the RCA 5. Normal LV systolic function   Recommendations: ASA and Plavix  for one month then stop ASA. Will restart Xarelto  tomorrow. Would recommend at least six month of Plavix  and Xarelto  but could consider stopping Plavix  at 6 months if necessary. Likely same day PCI discharge.  Cardiac cath 06/25/19: Procedures  LEFT HEART CATH AND CORONARY ANGIOGRAPHY  Conclusion    Non-stenotic Prox LAD to Mid LAD lesion was previously treated. Previously placed Mid Cx drug eluting stent is widely patent. Balloon angioplasty was performed. Prox RCA lesion is 20% stenosed. The left ventricular systolic function is normal. LV end diastolic pressure is normal. The left ventricular ejection fraction is 55-65% by visual estimate.   1. Nonobstructive CAD- patent stents in LAD and LCx. 2. Normal LV function 3. Normal LVEDP   Plan: continue medical therapy.      Echo 12/14/19: IMPRESSIONS     1. Left ventricular ejection fraction, by estimation, is 55 to 60%. The  left ventricle has normal function. The left ventricle has no regional  wall motion abnormalities. Left ventricular diastolic parameters were  normal. The average left ventricular  global longitudinal strain is -20.5 %. The global longitudinal strain is  normal.   2. Right ventricular systolic function is normal. The right ventricular  size is normal. There is normal pulmonary artery systolic pressure. The  estimated right ventricular systolic pressure is 31.5 mmHg.   3. The mitral valve is normal in structure. Mild mitral valve  regurgitation. No evidence of mitral stenosis.   4. The aortic valve is normal in structure. Aortic valve regurgitation is  not visualized.  No aortic stenosis is present.   5. The inferior vena cava is normal in size with greater than 50%  respiratory variability, suggesting right atrial pressure of 3 mmHg.    Event monitor 01/01/20: Study Highlights  Indication: palpitations   Duration: 30d   Findings HR  avg 67  Min 51-Max 127  PVCs <1% PACs about 1 % AFib noted < 1% Recommendations Will try low dose BB See note  TEE 08/27/20: IMPRESSIONS     1. Left ventricular ejection fraction, by estimation, is 55 to 60%. The  left ventricle has normal function.   2. Right ventricular systolic function is normal. The right ventricular  size is normal. There is normal pulmonary artery systolic pressure.   3. Left atrial size was moderately dilated. No left atrial/left atrial  appendage thrombus was detected.   4. Right atrial size was mildly dilated.   5. The mitral valve is grossly normal. Mild mitral valve regurgitation.   6. The aortic valve is tricuspid. There is mild thickening of the aortic  valve. Aortic valve regurgitation is not visualized. Mild aortic valve  sclerosis is present, with no evidence of aortic valve stenosis.   7. Agitated saline contrast bubble study was negative, with no evidence  of any interatrial shunt.   Labs/Other Tests and Data Reviewed:     Recent Labs: 11/30/2022: Magnesium 2.3 12/01/2022: Hemoglobin 13.9; Platelets 211 02/08/2023: BUN 19; Creatinine, Ser 1.04; Potassium 4.5; Sodium 143   Recent Lipid Panel Lab Results  Component Value Date/Time   CHOL 120 03/23/2019 08:38 AM   TRIG 42 03/23/2019 08:38 AM   HDL 50 03/23/2019 08:38 AM   CHOLHDL 2.4 03/23/2019 08:38 AM   CHOLHDL 3.0 06/29/2016 10:11 AM   LDLCALC 59 03/23/2019 08:38 AM   Dated 02/20/20: choleseterol 128, triglycerides 38, HDL 54, LDL 64. Dated 03/06/21: cholesterol 126, triglycerides 37, HDL 50, LDL 66. CMET, TSH, CBC normal.  Dated 03/23/22: cholesterol 118, triglycerides 42, HDL 43, LDL 64. CMET, TSH, CBC normal.   Dated 03/25/23: cholesterol 127, triglycerides 42, HDL 44, LDL 73, CBC, CMET, and TSH normal.    Wt Readings from Last 3 Encounters:  11/22/23 170 lb 6.4 oz (77.3 kg)  06/22/23 169 lb 12.8 oz (77 kg)  05/24/23 167 lb (75.8 kg)     Objective:    Vital Signs:  BP 138/82 (BP Location: Left Arm, Patient Position: Sitting, Cuff Size: Normal)   Pulse 77   Ht 6\' 1"  (1.854 m)   Wt 170 lb 6.4 oz (77.3 kg)   SpO2 97%   BMI 22.48 kg/m    GENERAL:  Well appearing WM in NAD HEENT:  PERRL, EOMI, sclera are clear. Oropharynx is clear. NECK:  No jugular venous distention, carotid upstroke brisk and symmetric, no bruits, no thyromegaly or adenopathy LUNGS:  Clear to auscultation bilaterally CHEST:  Unremarkable HEART:  RRR,  PMI not displaced or sustained,S1 and S2 within normal limits, no S3, no S4: no clicks, no rubs, no murmurs ABD:  Soft, nontender. BS +, no masses or bruits. No hepatomegaly, no splenomegaly EXT:  2 + pulses throughout, no edema, no cyanosis no clubbing. No radial site hematoma SKIN:  Warm and dry.  No rashes NEURO:  Alert and oriented x 3. Cranial nerves II through XII intact. PSYCH:  Cognitively intact    ASSESSMENT & PLAN:    1.   CAD: s/p PCI of the LCx with DES in May 2020.  Prior stent of the mid LAD in 2011. Repeat cardiac cath in Dec 2020 showed nonobstructive disease. Antianginal therapy limited by orthostatic hypotension. He is asymptomatic now.  2.   PAF: s/p DCCV on October 15, 2019. Recurrent in Feb 2022 and March 2022. Now s/p Afib ablation in May 2022.  Continue Tikosyn .  Continue Xarelto  therapy.   Unable to tolerate  rate control therapy with metoprolol   or amiodarone  due to side effects. Fortunately maintaining NSR  3.   Chronic orthostatic hypotension/ autonomic failure: uses  midodrine  PRN. Florinef  resulted in volume overload. His blood pressure is stable. He has learned to manage it.     4.   Hyperlipidemia: Continue current statin. LDL 73 on lower  lipitor dose.    Disposition:  6 month   Signed, Jerry Markwood [place], MD  11/22/2023 11:30 AM    [facility]

## 2023-11-22 ENCOUNTER — Ambulatory Visit: Payer: Medicare Other | Attending: Cardiology | Admitting: Cardiology

## 2023-11-22 ENCOUNTER — Encounter: Payer: Self-pay | Admitting: Cardiology

## 2023-11-22 VITALS — BP 138/82 | HR 77 | Ht 73.0 in | Wt 170.4 lb

## 2023-11-22 DIAGNOSIS — I480 Paroxysmal atrial fibrillation: Secondary | ICD-10-CM | POA: Insufficient documentation

## 2023-11-22 DIAGNOSIS — I951 Orthostatic hypotension: Secondary | ICD-10-CM | POA: Insufficient documentation

## 2023-11-22 DIAGNOSIS — I2510 Atherosclerotic heart disease of native coronary artery without angina pectoris: Secondary | ICD-10-CM | POA: Insufficient documentation

## 2023-11-22 DIAGNOSIS — E7800 Pure hypercholesterolemia, unspecified: Secondary | ICD-10-CM | POA: Diagnosis not present

## 2023-11-22 DIAGNOSIS — G909 Disorder of the autonomic nervous system, unspecified: Secondary | ICD-10-CM | POA: Insufficient documentation

## 2023-11-22 NOTE — Patient Instructions (Signed)
 Medication Instructions:  Continue same medications *If you need a refill on your cardiac medications before your next appointment, please call your pharmacy*  Lab Work: None ordered  Testing/Procedures: None ordered  Follow-Up: At [facility], you and your health needs are our priority.  As part of our continuing mission to provide you with exceptional heart care, our providers are all part of one team.  This team includes your primary Cardiologist (physician) and Advanced Practice Providers or APPs (Physician Assistants and Nurse Practitioners) who all work together to provide you with the care you need, when you need it.  Your next appointment:  6 months   Call in July to schedule Nov appointment     Provider:  Dr.Jordan   We recommend signing up for the patient portal called "MyChart".  Sign up information is provided on this After Visit Summary.  MyChart is used to connect with patients for Virtual Visits (Telemedicine).  Patients are able to view lab/test results, encounter notes, upcoming appointments, etc.  Non-urgent messages can be sent to your provider as well.   To learn more about what you can do with MyChart, go to [email or web address].

## 2023-11-24 ENCOUNTER — Telehealth: Payer: Self-pay | Admitting: Internal Medicine

## 2023-11-24 NOTE — Telephone Encounter (Signed)
 Patient identification verified by 2 forms. Sims Duck, RN     Called and spoke to patient  Patient states:  - unsure if he should keep appointment due to Dr. Rodolfo Clan being out on leave.               Interventions/Plan: - Informed patient that schedulers will contact him to move the appointment if necessary. No changes needed at this time.      Patient agrees with plan, no questions at this time

## 2023-11-24 NOTE — Telephone Encounter (Signed)
 Pt was to talk to Dr Doyle Generous nurse about his appt on 6-9/25

## 2023-11-27 DIAGNOSIS — I2510 Atherosclerotic heart disease of native coronary artery without angina pectoris: Secondary | ICD-10-CM | POA: Diagnosis not present

## 2023-11-27 DIAGNOSIS — I480 Paroxysmal atrial fibrillation: Secondary | ICD-10-CM | POA: Diagnosis not present

## 2023-11-27 DIAGNOSIS — I700 Atherosclerosis of aorta: Secondary | ICD-10-CM | POA: Diagnosis not present

## 2023-12-10 DIAGNOSIS — I480 Paroxysmal atrial fibrillation: Secondary | ICD-10-CM | POA: Diagnosis not present

## 2023-12-10 DIAGNOSIS — E7800 Pure hypercholesterolemia, unspecified: Secondary | ICD-10-CM | POA: Diagnosis not present

## 2023-12-10 DIAGNOSIS — I700 Atherosclerosis of aorta: Secondary | ICD-10-CM | POA: Diagnosis not present

## 2023-12-10 DIAGNOSIS — I2510 Atherosclerotic heart disease of native coronary artery without angina pectoris: Secondary | ICD-10-CM | POA: Diagnosis not present

## 2023-12-10 DIAGNOSIS — N401 Enlarged prostate with lower urinary tract symptoms: Secondary | ICD-10-CM | POA: Diagnosis not present

## 2023-12-19 ENCOUNTER — Ambulatory Visit: Payer: Medicare Other | Admitting: Internal Medicine

## 2023-12-20 ENCOUNTER — Ambulatory Visit: Attending: Cardiology | Admitting: Cardiology

## 2023-12-20 ENCOUNTER — Encounter: Payer: Self-pay | Admitting: Cardiology

## 2023-12-20 VITALS — BP 105/66 | HR 72 | Ht 73.0 in | Wt 164.0 lb

## 2023-12-20 DIAGNOSIS — I4819 Other persistent atrial fibrillation: Secondary | ICD-10-CM

## 2023-12-20 DIAGNOSIS — I951 Orthostatic hypotension: Secondary | ICD-10-CM | POA: Insufficient documentation

## 2023-12-20 DIAGNOSIS — R002 Palpitations: Secondary | ICD-10-CM | POA: Diagnosis not present

## 2023-12-20 DIAGNOSIS — Z79899 Other long term (current) drug therapy: Secondary | ICD-10-CM | POA: Diagnosis not present

## 2023-12-20 DIAGNOSIS — D6869 Other thrombophilia: Secondary | ICD-10-CM

## 2023-12-20 NOTE — Patient Instructions (Signed)
 Medication Instructions:  Your physician recommends that you continue on your current medications as directed. Please refer to the Current Medication list given to you today.  *If you need a refill on your cardiac medications before your next appointment, please call your pharmacy*  Lab Work: Tikosyn  surveillance labs today: BMET & Magnesium  If you have any lab test that is abnormal or we need to change your treatment, we will call you to review the results.  Testing/Procedures: None ordered  Follow-Up: At [facility], you and your health needs are our priority.  As part of our continuing mission to provide you with exceptional heart care, our providers are all part of one team.  This team includes your primary Cardiologist (physician) and Advanced Practice Providers or APPs (Physician Assistants and Nurse Practitioners) who all work together to provide you with the care you need, when you need it.  Your next appointment:   6 month(s)  Provider:   Agatha Horsfall, MD     Thank you for choosing [facility]!!   Reece Cane, RN [phone]

## 2023-12-20 NOTE — Progress Notes (Signed)
 Electrophysiology Office Note:   Date:  12/20/2023  ID:  Jerry Barr, DOB 11-23-1940, MRN [id number]  Primary Cardiologist: Jerry Swaziland, MD Primary Heart Failure: None Electrophysiologist: Jerry Grigoryan [name], MD      History of Present Illness:   Jerry Barr is a 83 y.o. male with h/o atrial fibrillation, orthostatic hypotension, coronary artery disease post LAD stent seen today for routine electrophysiology followup.   Since last being seen in our clinic the patient reports doing well.  He has no chest pain or shortness of breath.  He is able to all of his daily activities.  He does continue to have symptoms of orthostasis.  Symptoms are worse in the mornings.  His blood pressure has been quite low when he gets out of bed, but is usually better towards the end of the day after he has had a chance to wear his compression gear and salt and fluid load.  He is curious about transvascular autonomic modulation..  he denies chest pain, palpitations, dyspnea, PND, orthopnea, nausea, vomiting, dizziness, syncope, edema, weight gain, or early satiety.   Review of systems complete and found to be negative unless listed in HPI.   EP Information / Studies Reviewed:    EKG is ordered today. Personal review as below.  EKG Interpretation Date/Time:  Tuesday December 20 2023 10:15:17 EDT Ventricular Rate:  71 PR Interval:  214 QRS Duration:  84 QT Interval:  438 QTC Calculation: 475 R Axis:   78  Text Interpretation: Sinus rhythm with 1st degree A-V block When compared with ECG of 22-Jun-2023 15:12, No significant change since last tracing Confirmed by Joelynn Dust ([id number]) on 12/20/2023 10:19:00 AM     Risk Assessment/Calculations:    CHA2DS2-VASc Score = 3   This indicates a 3.2% annual risk of stroke. The patient's score is based upon: CHF History: 0 HTN History: 0 Diabetes History: 0 Stroke History: 0 Vascular Disease History: 1 Age Score: 2 Gender Score: 0            Physical  Exam:   VS:  BP 105/66 (BP Location: Left Arm, Patient Position: Standing, Cuff Size: Normal)   Pulse 72   Ht 6\' 1"  (1.854 m)   Wt 164 lb (74.4 kg)   SpO2 98%   BMI 21.64 kg/m    Wt Readings from Last 3 Encounters:  12/20/23 164 lb (74.4 kg)  11/22/23 170 lb 6.4 oz (77.3 kg)  06/22/23 169 lb 12.8 oz (77 kg)     GEN: Well nourished, well developed in no acute distress NECK: No JVD; No carotid bruits CARDIAC: Regular rate and rhythm, no murmurs, rubs, gallops RESPIRATORY:  Clear to auscultation without rales, wheezing or rhonchi  ABDOMEN: Soft, non-tender, non-distended EXTREMITIES:  No edema; No deformity   ASSESSMENT AND PLAN:    1.  Atrial fibrillation: Post ablation in 2022 with PVI and posterior wall ablation.  On dofetilide .  He has had minimal episodes of atrial fibrillation.  Jerry Barr continue with current management.  2.  Orthostatic hypotension: Continue as needed midodrine .  He is continuing to have episodes of orthostasis.  He has tried higher doses of midodrine  and Florinef .  He is using abdominal binders and support stockings.  He has been loading with salt and fluids.  He continues to have symptoms but they are improved with these maneuvers.  3.  Secondary hypercoagulable state: On Xarelto   4.  High-risk medication monitoring: On dofetilide .  QTc remained stable. Jerry Barr need BMP and magnesium today.  Follow  up with Dr. Lawana Pray in 6 months  Signed, Caisley Baxendale [name], MD

## 2023-12-21 LAB — BASIC METABOLIC PANEL WITH GFR
BUN/Creatinine Ratio: 23 (ref 10–24)
BUN: 21 mg/dL (ref 8–27)
CO2: 23 mmol/L (ref 20–29)
Calcium: 9.5 mg/dL (ref 8.6–10.2)
Chloride: 104 mmol/L (ref 96–106)
Creatinine, Ser: 0.92 mg/dL (ref 0.76–1.27)
Glucose: 84 mg/dL (ref 70–99)
Potassium: 5.4 mmol/L — ABNORMAL HIGH (ref 3.5–5.2)
Sodium: 141 mmol/L (ref 134–144)
eGFR: 83 mL/min/{1.73_m2} (ref >59)

## 2023-12-21 LAB — MAGNESIUM: Magnesium: 2.6 mg/dL — ABNORMAL HIGH (ref 1.6–2.3)

## 2023-12-23 ENCOUNTER — Ambulatory Visit: Payer: Self-pay

## 2023-12-23 DIAGNOSIS — E875 Hyperkalemia: Secondary | ICD-10-CM

## 2023-12-23 NOTE — Telephone Encounter (Signed)
 Spoke with pt and advised per Dr Lawana Pray to cut back on potassium rich foods.  Reviewed these foods with pt.  Pt verbalizes understanding and states he will come for labs in about 10 days as he will be out of town next week.  Pt thanked Charity fundraiser for the call.

## 2023-12-27 DIAGNOSIS — I700 Atherosclerosis of aorta: Secondary | ICD-10-CM | POA: Diagnosis not present

## 2023-12-27 DIAGNOSIS — I480 Paroxysmal atrial fibrillation: Secondary | ICD-10-CM | POA: Diagnosis not present

## 2023-12-27 DIAGNOSIS — I2510 Atherosclerotic heart disease of native coronary artery without angina pectoris: Secondary | ICD-10-CM | POA: Diagnosis not present

## 2024-01-04 ENCOUNTER — Other Ambulatory Visit: Payer: Self-pay | Admitting: Emergency Medicine

## 2024-01-04 DIAGNOSIS — E875 Hyperkalemia: Secondary | ICD-10-CM

## 2024-01-05 LAB — BASIC METABOLIC PANEL WITH GFR
BUN/Creatinine Ratio: 25 — ABNORMAL HIGH (ref 10–24)
BUN: 26 mg/dL (ref 8–27)
CO2: 21 mmol/L (ref 20–29)
Calcium: 9.1 mg/dL (ref 8.6–10.2)
Chloride: 107 mmol/L — ABNORMAL HIGH (ref 96–106)
Creatinine, Ser: 1.02 mg/dL (ref 0.76–1.27)
Glucose: 93 mg/dL (ref 70–99)
Potassium: 5.3 mmol/L — ABNORMAL HIGH (ref 3.5–5.2)
Sodium: 143 mmol/L (ref 134–144)
eGFR: 73 mL/min/{1.73_m2} (ref >59)

## 2024-01-08 ENCOUNTER — Ambulatory Visit: Payer: Self-pay | Admitting: Cardiology

## 2024-01-09 DIAGNOSIS — I480 Paroxysmal atrial fibrillation: Secondary | ICD-10-CM | POA: Diagnosis not present

## 2024-01-09 DIAGNOSIS — J302 Other seasonal allergic rhinitis: Secondary | ICD-10-CM | POA: Diagnosis not present

## 2024-01-09 DIAGNOSIS — Z0000 Encounter for general adult medical examination without abnormal findings: Secondary | ICD-10-CM | POA: Diagnosis not present

## 2024-01-09 DIAGNOSIS — E7800 Pure hypercholesterolemia, unspecified: Secondary | ICD-10-CM | POA: Diagnosis not present

## 2024-01-09 DIAGNOSIS — Z1331 Encounter for screening for depression: Secondary | ICD-10-CM | POA: Diagnosis not present

## 2024-01-09 DIAGNOSIS — I2510 Atherosclerotic heart disease of native coronary artery without angina pectoris: Secondary | ICD-10-CM | POA: Diagnosis not present

## 2024-01-09 DIAGNOSIS — N401 Enlarged prostate with lower urinary tract symptoms: Secondary | ICD-10-CM | POA: Diagnosis not present

## 2024-01-09 DIAGNOSIS — I700 Atherosclerosis of aorta: Secondary | ICD-10-CM | POA: Diagnosis not present

## 2024-01-09 DIAGNOSIS — Z8639 Personal history of other endocrine, nutritional and metabolic disease: Secondary | ICD-10-CM | POA: Diagnosis not present

## 2024-01-09 DIAGNOSIS — I951 Orthostatic hypotension: Secondary | ICD-10-CM | POA: Diagnosis not present

## 2024-01-26 DIAGNOSIS — I480 Paroxysmal atrial fibrillation: Secondary | ICD-10-CM | POA: Diagnosis not present

## 2024-01-26 DIAGNOSIS — I2510 Atherosclerotic heart disease of native coronary artery without angina pectoris: Secondary | ICD-10-CM | POA: Diagnosis not present

## 2024-01-26 DIAGNOSIS — I700 Atherosclerosis of aorta: Secondary | ICD-10-CM | POA: Diagnosis not present

## 2024-02-04 ENCOUNTER — Other Ambulatory Visit: Payer: Self-pay | Admitting: Cardiology

## 2024-02-09 DIAGNOSIS — N401 Enlarged prostate with lower urinary tract symptoms: Secondary | ICD-10-CM | POA: Diagnosis not present

## 2024-02-09 DIAGNOSIS — I2510 Atherosclerotic heart disease of native coronary artery without angina pectoris: Secondary | ICD-10-CM | POA: Diagnosis not present

## 2024-02-09 DIAGNOSIS — I700 Atherosclerosis of aorta: Secondary | ICD-10-CM | POA: Diagnosis not present

## 2024-02-09 DIAGNOSIS — E7800 Pure hypercholesterolemia, unspecified: Secondary | ICD-10-CM | POA: Diagnosis not present

## 2024-02-09 DIAGNOSIS — I480 Paroxysmal atrial fibrillation: Secondary | ICD-10-CM | POA: Diagnosis not present

## 2024-02-25 DIAGNOSIS — I2510 Atherosclerotic heart disease of native coronary artery without angina pectoris: Secondary | ICD-10-CM | POA: Diagnosis not present

## 2024-02-25 DIAGNOSIS — I480 Paroxysmal atrial fibrillation: Secondary | ICD-10-CM | POA: Diagnosis not present

## 2024-02-25 DIAGNOSIS — I700 Atherosclerosis of aorta: Secondary | ICD-10-CM | POA: Diagnosis not present

## 2024-03-11 DIAGNOSIS — I2510 Atherosclerotic heart disease of native coronary artery without angina pectoris: Secondary | ICD-10-CM | POA: Diagnosis not present

## 2024-03-11 DIAGNOSIS — E7800 Pure hypercholesterolemia, unspecified: Secondary | ICD-10-CM | POA: Diagnosis not present

## 2024-03-11 DIAGNOSIS — N401 Enlarged prostate with lower urinary tract symptoms: Secondary | ICD-10-CM | POA: Diagnosis not present

## 2024-03-11 DIAGNOSIS — I700 Atherosclerosis of aorta: Secondary | ICD-10-CM | POA: Diagnosis not present

## 2024-03-11 DIAGNOSIS — I480 Paroxysmal atrial fibrillation: Secondary | ICD-10-CM | POA: Diagnosis not present

## 2024-03-15 DIAGNOSIS — Z08 Encounter for follow-up examination after completed treatment for malignant neoplasm: Secondary | ICD-10-CM | POA: Diagnosis not present

## 2024-03-15 DIAGNOSIS — L578 Other skin changes due to chronic exposure to nonionizing radiation: Secondary | ICD-10-CM | POA: Diagnosis not present

## 2024-03-15 DIAGNOSIS — L821 Other seborrheic keratosis: Secondary | ICD-10-CM | POA: Diagnosis not present

## 2024-03-15 DIAGNOSIS — Z85828 Personal history of other malignant neoplasm of skin: Secondary | ICD-10-CM | POA: Diagnosis not present

## 2024-03-15 DIAGNOSIS — D1801 Hemangioma of skin and subcutaneous tissue: Secondary | ICD-10-CM | POA: Diagnosis not present

## 2024-03-15 DIAGNOSIS — L814 Other melanin hyperpigmentation: Secondary | ICD-10-CM | POA: Diagnosis not present

## 2024-03-26 DIAGNOSIS — H524 Presbyopia: Secondary | ICD-10-CM | POA: Diagnosis not present

## 2024-03-26 DIAGNOSIS — I480 Paroxysmal atrial fibrillation: Secondary | ICD-10-CM | POA: Diagnosis not present

## 2024-03-26 DIAGNOSIS — I2510 Atherosclerotic heart disease of native coronary artery without angina pectoris: Secondary | ICD-10-CM | POA: Diagnosis not present

## 2024-03-26 DIAGNOSIS — Z961 Presence of intraocular lens: Secondary | ICD-10-CM | POA: Diagnosis not present

## 2024-03-26 DIAGNOSIS — H26491 Other secondary cataract, right eye: Secondary | ICD-10-CM | POA: Diagnosis not present

## 2024-03-26 DIAGNOSIS — I700 Atherosclerosis of aorta: Secondary | ICD-10-CM | POA: Diagnosis not present

## 2024-03-26 DIAGNOSIS — H52202 Unspecified astigmatism, left eye: Secondary | ICD-10-CM | POA: Diagnosis not present

## 2024-04-10 DIAGNOSIS — I480 Paroxysmal atrial fibrillation: Secondary | ICD-10-CM | POA: Diagnosis not present

## 2024-04-10 DIAGNOSIS — I2510 Atherosclerotic heart disease of native coronary artery without angina pectoris: Secondary | ICD-10-CM | POA: Diagnosis not present

## 2024-04-10 DIAGNOSIS — E7800 Pure hypercholesterolemia, unspecified: Secondary | ICD-10-CM | POA: Diagnosis not present

## 2024-04-10 DIAGNOSIS — I700 Atherosclerosis of aorta: Secondary | ICD-10-CM | POA: Diagnosis not present

## 2024-04-10 DIAGNOSIS — N401 Enlarged prostate with lower urinary tract symptoms: Secondary | ICD-10-CM | POA: Diagnosis not present

## 2024-04-19 DIAGNOSIS — Z23 Encounter for immunization: Secondary | ICD-10-CM | POA: Diagnosis not present

## 2024-04-25 DIAGNOSIS — I480 Paroxysmal atrial fibrillation: Secondary | ICD-10-CM | POA: Diagnosis not present

## 2024-04-25 DIAGNOSIS — I700 Atherosclerosis of aorta: Secondary | ICD-10-CM | POA: Diagnosis not present

## 2024-04-25 DIAGNOSIS — I2510 Atherosclerotic heart disease of native coronary artery without angina pectoris: Secondary | ICD-10-CM | POA: Diagnosis not present

## 2024-05-11 DIAGNOSIS — I480 Paroxysmal atrial fibrillation: Secondary | ICD-10-CM | POA: Diagnosis not present

## 2024-05-11 DIAGNOSIS — N401 Enlarged prostate with lower urinary tract symptoms: Secondary | ICD-10-CM | POA: Diagnosis not present

## 2024-05-11 DIAGNOSIS — E7800 Pure hypercholesterolemia, unspecified: Secondary | ICD-10-CM | POA: Diagnosis not present

## 2024-05-11 DIAGNOSIS — I700 Atherosclerosis of aorta: Secondary | ICD-10-CM | POA: Diagnosis not present

## 2024-05-11 DIAGNOSIS — I2510 Atherosclerotic heart disease of native coronary artery without angina pectoris: Secondary | ICD-10-CM | POA: Diagnosis not present

## 2024-05-25 DIAGNOSIS — I480 Paroxysmal atrial fibrillation: Secondary | ICD-10-CM | POA: Diagnosis not present

## 2024-05-25 DIAGNOSIS — I700 Atherosclerosis of aorta: Secondary | ICD-10-CM | POA: Diagnosis not present

## 2024-05-25 DIAGNOSIS — I2510 Atherosclerotic heart disease of native coronary artery without angina pectoris: Secondary | ICD-10-CM | POA: Diagnosis not present

## 2024-05-31 ENCOUNTER — Telehealth: Payer: Self-pay | Admitting: Cardiology

## 2024-05-31 NOTE — Telephone Encounter (Signed)
 Spoke with the patient who reports that he has been experiencing back and shoulder pain that radiates around to his rib cage for the past 3 days. He states that he does not have chest pain but he does have some tightness at times. He reports shortness of breath with exertion. Denies any at rest. He states that this pain is different from his usual back and neck pain. Spoke with Dr. Jordan. Patient has been scheduled for an appointment on 11/24.

## 2024-05-31 NOTE — Telephone Encounter (Signed)
 Pt states the last 3 days when he walks he has been having upper back pain and neck pain. Feels more fatigued than usual. No cp or sob. Please advise

## 2024-06-04 ENCOUNTER — Ambulatory Visit: Attending: Cardiology | Admitting: Cardiology

## 2024-06-04 ENCOUNTER — Encounter: Payer: Self-pay | Admitting: Cardiology

## 2024-06-04 VITALS — BP 124/70 | HR 72 | Ht 72.0 in | Wt 171.4 lb

## 2024-06-04 DIAGNOSIS — I2510 Atherosclerotic heart disease of native coronary artery without angina pectoris: Secondary | ICD-10-CM | POA: Insufficient documentation

## 2024-06-04 DIAGNOSIS — I4819 Other persistent atrial fibrillation: Secondary | ICD-10-CM | POA: Insufficient documentation

## 2024-06-04 DIAGNOSIS — R0789 Other chest pain: Secondary | ICD-10-CM | POA: Insufficient documentation

## 2024-06-04 DIAGNOSIS — R0602 Shortness of breath: Secondary | ICD-10-CM | POA: Insufficient documentation

## 2024-06-04 MED ORDER — MIDODRINE HCL 5 MG PO TABS: ORAL_TABLET | ORAL | 3 refills | Status: AC

## 2024-06-04 NOTE — Progress Notes (Signed)
 Date:  06/04/2024   ID:  Jerry Barr, DOB December 13, 1940, MRN [id number]   PCP:  Seabron Lenis, MD  Cardiologist:  Jamice Carreno, MD  Electrophysiologist:  Soyla Gladis Norton, MD   Evaluation Performed:  Follow-Up Visit  Chief Complaint:CAD  History of Present Illness:    Jerry Barr is a 83 y.o. male seen for follow up. He has a  PMH of HLD, autonomic dysfunction, PAF on Xarelto , CAD s/p DES to LAD 2011 in [place] and BPH. He was diagnosed with new afib in 04/2016. Echo in 04/2016 showed EF 50-55%, no RWMA, mild MR. He had successful TEE DCCV 04/23/2016. He was placed on midodrine  for orthostatic hypotension.  Myoview   In 06/2017 showed EF 55%, no significant ischemia or scar. Echo in 06/2017 EF 45-50%, mild diffuse hypokinesis, PA peak pressure .  Patient was seen in the  ED in April  2020 with recurrent afib and fatigue. Given his history of orthostatic hypotension, it was elected to proceed with cardioversion in the ED. This was performed by Dr. Bensimhon. Post cardioversion, his amiodarone  was increased to 200mg  BID. After discharge, he was seen in the afib clinic with plans to consider decreasing amiodarone  back to 200mg  daily after 1 month if no recurrent afib.   He was seen on 10/30/2018 for post hospital follow-up, at which time he noted  shoulder blade pain that occur with  walking. He had a  cardiac catheterization on 11/15/2018 which revealed 20% proximal RCA, 90% mid left circumflex lesion treated with a 2.25 x 16 mm Synergy DES, EF 55 to 65%.   He underwent repeat Cardiac cath on 06/25/19 and this showed only nonobstructive disease with patent stents.   He has since been seen by Dr Fernande for evaluation of his orthostatic hypotension- felt to be related to autonomic dysfunction. Echo was OK. Dr Fernande felt that amiodarone  was making symptoms worse and this was discontinued. Event monitor showed AFib burden of 0.6% so AAD therapy was not needed. He was tried on low dose  metoprolol  but did not tolerate this due to fatigue and indigestion.   He was seen in February 2022 with recurrent and persistent AFib. Had DCCV but early return. He was admitted and started on Tikosyn . Had repeat DCCV on 08/29/20. Had recurrent Afib in March with DCCV. Subsequently had Afib ablation in May. Follow up with EP demonstrated no recurrence. Also seen by Dr Fernande for orthostatic intolerance and midodrine  dose increased.   He was seen by Dr Fernande in July. Noted symptoms of fatigue and some dizziness with autonomic dysfunction. Was started on florinef  but developed significant volume retention and this was discontinued. Lipitor dose was reduced to help with muscle fatigue.  On follow up today he reports that last week he developed bad pain in his back, shoulder blades that wrapped around his chest. He has had this with his orthostasis in the past but also his anginal symptoms were similar. He has been walking 45 minutes/day and using a systems analyst. Notes he went on a gluten free diet 2 months ago and notes fatigue and brain fog are better but orthostasis is about the same.     Past Medical History:  Diagnosis Date   Allergy    Aortic atherosclerosis    Atrial fibrillation (HCC)    BPH (benign prostatic hyperplasia)    CAD (coronary artery disease)    Cancer (HCC)    basal cell ca - face, nose and right leg  Coronary artery disease    Promus drug-eluting stent to a 99% mid LAD, 40-50% narrowing in the distal LAD with mild to moderate disease in the circ and RCA b. 5/6 PCI/DESx1 to mLcx   Dyslipidemia    ED (erectile dysfunction)    Hearing loss    Hyperplastic colon polyp    Hypoglycemia    Orthostatic hypotension    REM sleep behavior disorder    not tested   Past Surgical History:  Procedure Laterality Date   ATRIAL FIBRILLATION ABLATION N/A 11/18/2020   Procedure: ATRIAL FIBRILLATION ABLATION;  Surgeon: Kelsie Agent, MD;  Location: [facility] INVASIVE CV LAB;  Service:  Cardiovascular;  Laterality: N/A;   BUBBLE STUDY  08/27/2020   Procedure: BUBBLE STUDY;  Surgeon: Hobart Powell BRAVO, MD;  Location: [facility] ENDOSCOPY;  Service: Cardiovascular;;   CARDIOVERSION N/A 04/23/2016   Procedure: CARDIOVERSION;  Surgeon: Oneil JAYSON Parchment, MD;  Location: [facility] ENDOSCOPY;  Service: Cardiovascular;  Laterality: N/A;   CARDIOVERSION N/A 08/21/2020   Procedure: CARDIOVERSION;  Surgeon: Maranda Leim DEL, MD;  Location: [facility] ENDOSCOPY;  Service: Cardiovascular;  Laterality: N/A;   CARDIOVERSION N/A 08/29/2020   Procedure: CARDIOVERSION;  Surgeon: Francyne Headland, MD;  Location: [facility] ENDOSCOPY;  Service: Cardiovascular;  Laterality: N/A;   CARDIOVERSION N/A 09/24/2020   Procedure: CARDIOVERSION;  Surgeon: Hobart Powell BRAVO, MD;  Location: [facility] ENDOSCOPY;  Service: Cardiovascular;  Laterality: N/A;   COLONOSCOPY     CORONARY ANGIOPLASTY WITH STENT PLACEMENT     CORONARY STENT INTERVENTION N/A 11/15/2018   Procedure: CORONARY STENT INTERVENTION;  Surgeon: Verlin Lonni BIRCH, MD;  Location: [facility] INVASIVE CV LAB;  Service: Cardiovascular;  Laterality: N/A;   CYSTOSCOPY W/ RETROGRADES Bilateral 12/30/2022   Procedure: CYSTOSCOPY WITH BILATERAL RETROGRADE PYELOGRAM;  Surgeon: Cam Morene ORN, MD;  Location: [facility] ORS;  Service: Urology;  Laterality: Bilateral;   CYSTOSCOPY WITH BIOPSY N/A 12/30/2022   Procedure: CYSTOSCOPY WITH BLADDER BIOPSY;  Surgeon: Cam Morene ORN, MD;  Location: [facility] ORS;  Service: Urology;  Laterality: N/A;  45 MINUTES   CYSTOSCOPY WITH FULGERATION N/A 12/30/2022   Procedure: CYSTOSCOPY WITH FULGERATION;  Surgeon: Cam Morene ORN, MD;  Location: [facility] ORS;  Service: Urology;  Laterality: N/A;   LEFT HEART CATH AND CORONARY ANGIOGRAPHY N/A 11/15/2018   Procedure: LEFT HEART CATH AND CORONARY ANGIOGRAPHY;  Surgeon: Verlin Lonni BIRCH, MD;  Location: [facility] INVASIVE CV LAB;  Service: Cardiovascular;  Laterality: N/A;   LEFT HEART CATH AND CORONARY ANGIOGRAPHY N/A  06/25/2019   Procedure: LEFT HEART CATH AND CORONARY ANGIOGRAPHY;  Surgeon: Emanuel Dowson M, MD;  Location: [facility] INVASIVE CV LAB;  Service: Cardiovascular;  Laterality: N/A;   POLYPECTOMY     TEE WITHOUT CARDIOVERSION N/A 04/23/2016   Procedure: TRANSESOPHAGEAL ECHOCARDIOGRAM (TEE);  Surgeon: Oneil JAYSON Parchment, MD;  Location: [facility] ENDOSCOPY;  Service: Cardiovascular;  Laterality: N/A;   TEE WITHOUT CARDIOVERSION N/A 08/27/2020   Procedure: TRANSESOPHAGEAL ECHOCARDIOGRAM (TEE);  Surgeon: Hobart Powell BRAVO, MD;  Location: [facility] ENDOSCOPY;  Service: Cardiovascular;  Laterality: N/A;   UPPER GASTROINTESTINAL ENDOSCOPY       Current Meds  Medication Sig   acetaminophen  (TYLENOL ) 500 MG tablet Take 500 mg by mouth every 6 (six) hours as needed for mild pain (or headaches).   atorvastatin  (LIPITOR) 20 MG tablet Take 1/2 tablet 10 mg daily   Calcium  & Magnesium Carbonates (MYLANTA PO) Take by mouth as needed.   Cholecalciferol  (VITAMIN D -3) 1000 UNITS CAPS Take 2,000 Units by mouth daily.   co-enzyme Q-10 30 MG  capsule Take 30 mg by mouth daily.   dofetilide  (TIKOSYN ) 500 MCG capsule TAKE 1 CAPSULE BY MOUTH 2 TIMES A DAY   famotidine  (PEPCID ) 20 MG tablet Take 20 mg by mouth as needed.   fluticasone  (FLONASE ) 50 MCG/ACT nasal spray Place 1 spray into both nostrils as needed.   loratadine  (CLARITIN ) 10 MG tablet Take 10 mg by mouth daily.   Melatonin 3 MG TABS Take 6 mg by mouth at bedtime.   midodrine  (PROAMATINE ) 5 MG tablet Take 5 mg by mouth daily as needed (hypotension).   olopatadine (PATANOL) 0.1 % ophthalmic solution Place 1 drop into both eyes daily as needed (allergies).   polyethylene glycol (MIRALAX  / GLYCOLAX ) 17 g packet Take 17 g by mouth daily as needed for mild constipation.   propranolol  (INDERAL ) 20 MG tablet TAKE 1 TABLET BY MOUTH AS NEEDED   rivaroxaban  (XARELTO ) 20 MG TABS tablet Take 1 tablet (20 mg total) by mouth daily.   Simethicone (GAS-X EXTRA STRENGTH) 125 MG CAPS Take by mouth  as needed.     Allergies:   Fludrocortisone , Amiodarone , Metoprolol , and Penicillins   Social History   Tobacco Use   Smoking status: Never   Smokeless tobacco: Never  Vaping Use   Vaping status: Never Used  Substance Use Topics   Alcohol use: Not Currently    Alcohol/week: 4.0 standard drinks of alcohol    Types: 4 Glasses of wine per week    Comment: 3oz of red wine   Drug use: No     Family Hx: The patient's family history includes Bone cancer in his paternal grandfather; Breast cancer in his mother; CAD in his father; Cancer in his brother; Colon cancer in his brother and mother; Esophageal cancer in his brother; Heart attack in his paternal grandmother; Leukemia in his paternal uncle; Liver cancer in his brother. There is no history of Pancreatic cancer, Prostate cancer, Rectal cancer, or Stomach cancer.  ROS:   Please see the history of present illness.     All other systems reviewed and are negative.   Prior CV studies:   The following studies were reviewed today:  Echo 06/13/17: Study Conclusions   - Left ventricle: The cavity size was normal. Systolic function was   mildly reduced. The estimated ejection fraction was in the range   of 45% to 50%. Mild diffuse hypokinesis. Left ventricular   diastolic function parameters were normal. - Aortic valve: Transvalvular velocity was within the normal range.   There was no stenosis. There was trivial regurgitation. - Mitral valve: There was trivial regurgitation. - Left atrium: The atrium was moderately dilated. - Right ventricle: The cavity size was normal. Wall thickness was   normal. Systolic function was normal. - Right atrium: The atrium was severely dilated. - Atrial septum: No defect or patent foramen ovale was identified   by color flow Doppler. - Tricuspid valve: There was trivial regurgitation. - Pulmonary arteries: Systolic pressure was within the normal   range. PA peak pressure: 24 mm Hg (S).  Cath  11/15/2018 Prox RCA lesion is 20% stenosed. Mid Cx lesion is 90% stenosed. Previously placed Prox LAD to Mid LAD stent (unknown type) is widely patent. A drug-eluting stent was successfully placed using a STENT SYNERGY DES 2.25X16. Post intervention, there is a 0% residual stenosis. The left ventricular systolic function is normal. LV end diastolic pressure is normal. The left ventricular ejection fraction is 55-65% by visual estimate. There is no mitral valve regurgitation.  1. Patent mid LAD stent without restenosis 2. Severe stenosis mid Circumflex artery.  3. Successful PTCA/DES x 1 mid Circumflex 4. Mild non-obstructive disease in the RCA 5. Normal LV systolic function   Recommendations: ASA and Plavix  for one month then stop ASA. Will restart Xarelto  tomorrow. Would recommend at least six month of Plavix  and Xarelto  but could consider stopping Plavix  at 6 months if necessary. Likely same day PCI discharge.    Cardiac cath 06/25/19: Procedures  LEFT HEART CATH AND CORONARY ANGIOGRAPHY  Conclusion    Non-stenotic Prox LAD to Mid LAD lesion was previously treated. Previously placed Mid Cx drug eluting stent is widely patent. Balloon angioplasty was performed. Prox RCA lesion is 20% stenosed. The left ventricular systolic function is normal. LV end diastolic pressure is normal. The left ventricular ejection fraction is 55-65% by visual estimate.   1. Nonobstructive CAD- patent stents in LAD and LCx. 2. Normal LV function 3. Normal LVEDP   Plan: continue medical therapy.      Echo 12/14/19: IMPRESSIONS     1. Left ventricular ejection fraction, by estimation, is 55 to 60%. The  left ventricle has normal function. The left ventricle has no regional  wall motion abnormalities. Left ventricular diastolic parameters were  normal. The average left ventricular  global longitudinal strain is -20.5 %. The global longitudinal strain is  normal.   2. Right ventricular systolic  function is normal. The right ventricular  size is normal. There is normal pulmonary artery systolic pressure. The  estimated right ventricular systolic pressure is 31.5 mmHg.   3. The mitral valve is normal in structure. Mild mitral valve  regurgitation. No evidence of mitral stenosis.   4. The aortic valve is normal in structure. Aortic valve regurgitation is  not visualized. No aortic stenosis is present.   5. The inferior vena cava is normal in size with greater than 50%  respiratory variability, suggesting right atrial pressure of 3 mmHg.    Event monitor 01/01/20: Study Highlights  Indication: palpitations   Duration: 30d   Findings HR  avg 67  Min 51-Max 127  PVCs <1% PACs about 1 % AFib noted < 1% Recommendations Will try low dose BB See note  TEE 08/27/20: IMPRESSIONS     1. Left ventricular ejection fraction, by estimation, is 55 to 60%. The  left ventricle has normal function.   2. Right ventricular systolic function is normal. The right ventricular  size is normal. There is normal pulmonary artery systolic pressure.   3. Left atrial size was moderately dilated. No left atrial/left atrial  appendage thrombus was detected.   4. Right atrial size was mildly dilated.   5. The mitral valve is grossly normal. Mild mitral valve regurgitation.   6. The aortic valve is tricuspid. There is mild thickening of the aortic  valve. Aortic valve regurgitation is not visualized. Mild aortic valve  sclerosis is present, with no evidence of aortic valve stenosis.   7. Agitated saline contrast bubble study was negative, with no evidence  of any interatrial shunt.   Labs/Other Tests and Data Reviewed:    EKG Interpretation Date/Time:  Monday June 04 2024 10:06:54 EST Ventricular Rate:  72 PR Interval:  204 QRS Duration:  82 QT Interval:  414 QTC Calculation: 453 R Axis:   52  Text Interpretation: Sinus rhythm with sinus arrhythmia with occasional Premature ventricular  complexes When compared with ECG of 20-Dec-2023 10:15, Premature ventricular complexes are now Present T wave inversion now  evident in Inferior leads Confirmed by Kagen Kunath [phone]) on 06/04/2024 10:09:29 AM   Recent Labs: 12/20/2023: Magnesium 2.6 01/04/2024: BUN 26; Creatinine, Ser 1.02; Potassium 5.3; Sodium 143   Recent Lipid Panel Lab Results  Component Value Date/Time   CHOL 120 03/23/2019 08:38 AM   TRIG 42 03/23/2019 08:38 AM   HDL 50 03/23/2019 08:38 AM   CHOLHDL 2.4 03/23/2019 08:38 AM   CHOLHDL 3.0 06/29/2016 10:11 AM   LDLCALC 59 03/23/2019 08:38 AM   Dated 02/20/20: choleseterol 128, triglycerides 38, HDL 54, LDL 64. Dated 03/06/21: cholesterol 126, triglycerides 37, HDL 50, LDL 66. CMET, TSH, CBC normal.  Dated 03/23/22: cholesterol 118, triglycerides 42, HDL 43, LDL 64. CMET, TSH, CBC normal.  Dated 03/25/23: cholesterol 127, triglycerides 42, HDL 44, LDL 73, CBC, CMET, and TSH normal.    Wt Readings from Last 3 Encounters:  06/04/24 171 lb 6.4 oz (77.7 kg)  12/20/23 164 lb (74.4 kg)  11/22/23 170 lb 6.4 oz (77.3 kg)     Objective:    Vital Signs:  Pulse 72   Ht 6' (1.829 m)   Wt 171 lb 6.4 oz (77.7 kg)   SpO2 97%   BMI 23.25 kg/m    GENERAL:  Well appearing WM in NAD HEENT:  PERRL, EOMI, sclera are clear. Oropharynx is clear. NECK:  No jugular venous distention, carotid upstroke brisk and symmetric, no bruits, no thyromegaly or adenopathy LUNGS:  Clear to auscultation bilaterally CHEST:  Unremarkable HEART:  RRR,  PMI not displaced or sustained,S1 and S2 within normal limits, no S3, no S4: no clicks, no rubs, no murmurs ABD:  Soft, nontender. BS +, no masses or bruits. No hepatomegaly, no splenomegaly EXT:  2 + pulses throughout, no edema, no cyanosis no clubbing. No radial site hematoma SKIN:  Warm and dry.  No rashes NEURO:  Alert and oriented x 3. Cranial nerves II through XII intact. PSYCH:  Cognitively intact    ASSESSMENT & PLAN:    1.   CAD:  s/p PCI of the LCx with DES in May 2020.  Prior stent of the mid LAD in 2011. Repeat cardiac cath in Dec 2020 showed nonobstructive disease. Antianginal therapy limited by orthostatic hypotension. He is having some atypical symptoms now. Will arrange for a stress Myoview .   2.   PAF: s/p DCCV on October 15, 2019. Recurrent in Feb 2022 and March 2022. Now s/p Afib ablation in May 2022.  Continue Tikosyn .  Continue Xarelto  therapy.   Unable to tolerate  rate control therapy with metoprolol   or amiodarone  due to side effects. Fortunately maintaining NSR  3.   Chronic orthostatic hypotension/ autonomic failure: uses  midodrine  PRN. Florinef  resulted in volume overload. His blood pressure is stable. He has learned to manage it.     4.   Hyperlipidemia: Continue current statin. LDL 61 on lower lipitor dose.    Disposition:  pending results of Myoview   Signed, Aamari Strawderman, MD  06/04/2024 10:09 AM    [facility]

## 2024-06-04 NOTE — Addendum Note (Signed)
 Addended by: CHRISTIANNE CHANNING PARAS on: 06/04/2024 10:36 AM   Modules accepted: Orders

## 2024-06-04 NOTE — Addendum Note (Signed)
 Addended by: CHRISTIANNE CHANNING PARAS on: 06/04/2024 10:28 AM   Modules accepted: Orders

## 2024-06-04 NOTE — Patient Instructions (Addendum)
 Medication Instructions:  Continue same medications *If you need a refill on your cardiac medications before your next appointment, please call your pharmacy*  Lab Work: None ordered  Testing/Procedures: Stress Myoview   No Caffeine day before No food 3 hours before You may have water  Wear comfortable clothes and walking shoes No lotion  No cologne    Follow-Up: At [facility], you and your health needs are our priority.  As part of our continuing mission to provide you with exceptional heart care, our providers are all part of one team.  This team includes your primary Cardiologist (physician) and Advanced Practice Providers or APPs (Physician Assistants and Nurse Practitioners) who all work together to provide you with the care you need, when you need it.  Your next appointment:  To Be Determined    Provider:  Dr.Jordan   We recommend signing up for the patient portal called MyChart.  Sign up information is provided on this After Visit Summary.  MyChart is used to connect with patients for Virtual Visits (Telemedicine).  Patients are able to view lab/test results, encounter notes, upcoming appointments, etc.  Non-urgent messages can be sent to your provider as well.   To learn more about what you can do with MyChart, go to [email or web address].

## 2024-06-06 ENCOUNTER — Telehealth (HOSPITAL_COMMUNITY): Payer: Self-pay | Admitting: *Deleted

## 2024-06-06 ENCOUNTER — Other Ambulatory Visit: Payer: Self-pay | Admitting: Cardiology

## 2024-06-06 DIAGNOSIS — R0602 Shortness of breath: Secondary | ICD-10-CM

## 2024-06-06 DIAGNOSIS — I2510 Atherosclerotic heart disease of native coronary artery without angina pectoris: Secondary | ICD-10-CM

## 2024-06-06 DIAGNOSIS — I4819 Other persistent atrial fibrillation: Secondary | ICD-10-CM

## 2024-06-06 DIAGNOSIS — R0789 Other chest pain: Secondary | ICD-10-CM

## 2024-06-06 NOTE — Telephone Encounter (Signed)
 Patient given detailed instructions per Myocardial Perfusion Study Information Sheet for the test on 06/11/2024 at 10:30. Patient notified to arrive 15 minutes early and that it is imperative to arrive on time for appointment to keep from having the test rescheduled.  If you need to cancel or reschedule your appointment, please call the office within 24 hours of your appointment. . Patient verbalized understanding.Jerry Barr

## 2024-06-10 DIAGNOSIS — I700 Atherosclerosis of aorta: Secondary | ICD-10-CM | POA: Diagnosis not present

## 2024-06-10 DIAGNOSIS — N401 Enlarged prostate with lower urinary tract symptoms: Secondary | ICD-10-CM | POA: Diagnosis not present

## 2024-06-10 DIAGNOSIS — I2510 Atherosclerotic heart disease of native coronary artery without angina pectoris: Secondary | ICD-10-CM | POA: Diagnosis not present

## 2024-06-10 DIAGNOSIS — E7800 Pure hypercholesterolemia, unspecified: Secondary | ICD-10-CM | POA: Diagnosis not present

## 2024-06-10 DIAGNOSIS — I480 Paroxysmal atrial fibrillation: Secondary | ICD-10-CM | POA: Diagnosis not present

## 2024-06-11 ENCOUNTER — Other Ambulatory Visit: Payer: Self-pay

## 2024-06-11 ENCOUNTER — Ambulatory Visit (HOSPITAL_COMMUNITY)
Admission: RE | Admit: 2024-06-11 | Discharge: 2024-06-11 | Disposition: A | Source: Ambulatory Visit | Attending: Cardiology | Admitting: Cardiology

## 2024-06-11 ENCOUNTER — Ambulatory Visit

## 2024-06-11 DIAGNOSIS — I4819 Other persistent atrial fibrillation: Secondary | ICD-10-CM | POA: Insufficient documentation

## 2024-06-11 DIAGNOSIS — R002 Palpitations: Secondary | ICD-10-CM | POA: Diagnosis not present

## 2024-06-11 DIAGNOSIS — I2510 Atherosclerotic heart disease of native coronary artery without angina pectoris: Secondary | ICD-10-CM | POA: Diagnosis not present

## 2024-06-11 DIAGNOSIS — R0602 Shortness of breath: Secondary | ICD-10-CM | POA: Insufficient documentation

## 2024-06-11 DIAGNOSIS — R0789 Other chest pain: Secondary | ICD-10-CM | POA: Diagnosis not present

## 2024-06-11 MED ORDER — TECHNETIUM TC 99M TETROFOSMIN IV KIT
30.6000 | PACK | Freq: Once | INTRAVENOUS | Status: AC | PRN
  Administered 2024-06-11: 30.6 via INTRAVENOUS

## 2024-06-11 MED ORDER — TECHNETIUM TC 99M TETROFOSMIN IV KIT
10.8000 | PACK | Freq: Once | INTRAVENOUS | Status: AC | PRN
  Administered 2024-06-11: 10.8 via INTRAVENOUS

## 2024-06-11 NOTE — Progress Notes (Unsigned)
 Enrolled patient for a 14 day Zio XT  monitor to be mailed to patients home

## 2024-06-12 ENCOUNTER — Ambulatory Visit: Payer: Self-pay | Admitting: Cardiology

## 2024-06-12 LAB — MYOCARDIAL PERFUSION IMAGING
Angina Index: 0
Duke Treadmill Score: 9
Estimated workload: 8.8
Exercise duration (min): 8 min
Exercise duration (sec): 32 s
LV dias vol: 104 mL (ref 62–150)
LV sys vol: 36 mL (ref 4.2–5.8)
MPHR: 138 {beats}/min
Nuc Stress EF: 65 %
Peak HR: 120 {beats}/min
Percent HR: 86 %
Rest HR: 81 {beats}/min
Rest Nuclear Isotope Dose: 10.8 mCi
SDS: 1
SRS: 6
SSS: 6
ST Depression (mm): 0 mm
Stress Nuclear Isotope Dose: 30.6 mCi
TID: 1.08

## 2024-06-18 ENCOUNTER — Encounter (HOSPITAL_COMMUNITY)

## 2024-06-18 ENCOUNTER — Telehealth: Payer: Self-pay | Admitting: Cardiology

## 2024-06-18 NOTE — Telephone Encounter (Signed)
 Patient called to report to RN Channing that he has not received his heart monitor

## 2024-06-18 NOTE — Telephone Encounter (Signed)
 According to [employer] tracking # [id number] His package will arrive later than expected, but is still on its way.  It is currently in transit to the next facility.  Arrived in [facility] [place] [facility] 06/15/24 as of 12/6 in Transit to next facility.  Patient is aware.

## 2024-06-21 ENCOUNTER — Ambulatory Visit: Admitting: Cardiology

## 2024-07-21 ENCOUNTER — Ambulatory Visit: Payer: Self-pay | Admitting: Cardiology

## 2024-07-21 DIAGNOSIS — R002 Palpitations: Secondary | ICD-10-CM | POA: Diagnosis not present

## 2024-07-25 ENCOUNTER — Telehealth: Payer: Self-pay

## 2024-07-25 NOTE — Telephone Encounter (Signed)
 Monitor result printed and given to Dr Inocencio, RN Sherri price as pt is scheduled for follow up with Dr Inocencio on 08/21/24.

## 2024-08-21 ENCOUNTER — Ambulatory Visit: Admitting: Cardiology
